# Patient Record
Sex: Female | Born: 1955 | Race: White | Hispanic: No | Marital: Single | Smoking: Former smoker
Health system: Southern US, Community
[De-identification: ages and names within clinical notes are randomized; demographics above are authoritative.]

## PROBLEM LIST (undated history)

## (undated) DIAGNOSIS — K819 Cholecystitis, unspecified: Secondary | ICD-10-CM

## (undated) DIAGNOSIS — M1990 Unspecified osteoarthritis, unspecified site: Secondary | ICD-10-CM

## (undated) DIAGNOSIS — I10 Essential (primary) hypertension: Secondary | ICD-10-CM

## (undated) DIAGNOSIS — K219 Gastro-esophageal reflux disease without esophagitis: Secondary | ICD-10-CM

## (undated) DIAGNOSIS — F419 Anxiety disorder, unspecified: Secondary | ICD-10-CM

## (undated) DIAGNOSIS — I4891 Unspecified atrial fibrillation: Secondary | ICD-10-CM

## (undated) DIAGNOSIS — E119 Type 2 diabetes mellitus without complications: Secondary | ICD-10-CM

## (undated) DIAGNOSIS — E785 Hyperlipidemia, unspecified: Secondary | ICD-10-CM

## (undated) DIAGNOSIS — R0600 Dyspnea, unspecified: Secondary | ICD-10-CM

## (undated) DIAGNOSIS — I509 Heart failure, unspecified: Secondary | ICD-10-CM

## (undated) HISTORY — DX: Unspecified atrial fibrillation: I48.91

## (undated) HISTORY — DX: Hyperlipidemia, unspecified: E78.5

## (undated) HISTORY — DX: Anxiety disorder, unspecified: F41.9

## (undated) HISTORY — DX: Heart failure, unspecified: I50.9

## (undated) HISTORY — DX: Cholecystitis, unspecified: K81.9

## (undated) HISTORY — DX: Essential (primary) hypertension: I10

## (undated) HISTORY — DX: Type 2 diabetes mellitus without complications: E11.9

---

## 2010-10-21 HISTORY — PX: COLONOSCOPY: SHX174

## 2011-03-04 ENCOUNTER — Ambulatory Visit: Payer: Self-pay | Admitting: Gastroenterology

## 2015-09-19 ENCOUNTER — Other Ambulatory Visit: Payer: Self-pay

## 2015-09-19 MED ORDER — HYDROCHLOROTHIAZIDE 25 MG PO TABS: 25.0000 mg | ORAL_TABLET | Freq: Every day | ORAL | Status: DC

## 2015-09-19 NOTE — Telephone Encounter (Signed)
LAST VISIT: 02/15/2015 PRACTICE PARTNER: [id number]  Request for HCTZ 25mg  tablet.

## 2015-09-19 NOTE — Telephone Encounter (Signed)
Apt and PE apt

## 2015-11-06 ENCOUNTER — Encounter: Payer: Self-pay | Admitting: Family Medicine

## 2015-12-04 DIAGNOSIS — I10 Essential (primary) hypertension: Secondary | ICD-10-CM | POA: Insufficient documentation

## 2015-12-04 DIAGNOSIS — I152 Hypertension secondary to endocrine disorders: Secondary | ICD-10-CM | POA: Insufficient documentation

## 2015-12-04 DIAGNOSIS — E1159 Type 2 diabetes mellitus with other circulatory complications: Secondary | ICD-10-CM | POA: Insufficient documentation

## 2015-12-04 DIAGNOSIS — E1169 Type 2 diabetes mellitus with other specified complication: Secondary | ICD-10-CM | POA: Insufficient documentation

## 2015-12-04 DIAGNOSIS — E1122 Type 2 diabetes mellitus with diabetic chronic kidney disease: Secondary | ICD-10-CM | POA: Insufficient documentation

## 2015-12-04 DIAGNOSIS — E785 Hyperlipidemia, unspecified: Secondary | ICD-10-CM | POA: Insufficient documentation

## 2015-12-04 DIAGNOSIS — N183 Chronic kidney disease, stage 3 unspecified: Secondary | ICD-10-CM | POA: Insufficient documentation

## 2015-12-07 ENCOUNTER — Encounter: Payer: Self-pay | Admitting: Family Medicine

## 2015-12-07 ENCOUNTER — Ambulatory Visit (INDEPENDENT_AMBULATORY_CARE_PROVIDER_SITE_OTHER): Payer: BLUE CROSS/BLUE SHIELD | Admitting: Family Medicine

## 2015-12-07 VITALS — BP 132/72 | HR 60 | Temp 98.1°F | Ht 60.8 in | Wt 201.0 lb

## 2015-12-07 DIAGNOSIS — E119 Type 2 diabetes mellitus without complications: Secondary | ICD-10-CM

## 2015-12-07 DIAGNOSIS — I10 Essential (primary) hypertension: Secondary | ICD-10-CM | POA: Diagnosis not present

## 2015-12-07 DIAGNOSIS — E785 Hyperlipidemia, unspecified: Secondary | ICD-10-CM

## 2015-12-07 LAB — LP+ALT+AST PICCOLO, WAIVED
ALT (SGPT) PICCOLO, WAIVED: 42 U/L (ref 10–47)
AST (SGOT) PICCOLO, WAIVED: 37 U/L (ref 11–38)
Chol/HDL Ratio Piccolo,Waive: 2.9 mg/dL
Cholesterol Piccolo, Waived: 178 mg/dL (ref <200)
HDL Chol Piccolo, Waived: 62 mg/dL (ref >59)
LDL Chol Calc Piccolo Waived: 70 mg/dL (ref <100)
TRIGLYCERIDES PICCOLO,WAIVED: 232 mg/dL — AB (ref <150)
VLDL Chol Calc Piccolo,Waive: 46 mg/dL — ABNORMAL HIGH (ref <30)

## 2015-12-07 LAB — BAYER DCA HB A1C WAIVED: HB A1C (BAYER DCA - WAIVED): 9.3 % — ABNORMAL HIGH (ref <7.0)

## 2015-12-07 MED ORDER — SAXAGLIPTIN HCL 5 MG PO TABS: 5.0000 mg | ORAL_TABLET | Freq: Every day | ORAL | Status: DC

## 2015-12-07 MED ORDER — GLIMEPIRIDE 4 MG PO TABS: 4.0000 mg | ORAL_TABLET | Freq: Every day | ORAL | Status: DC

## 2015-12-07 MED ORDER — CARVEDILOL 25 MG PO TABS: 25.0000 mg | ORAL_TABLET | Freq: Two times a day (BID) | ORAL | Status: DC

## 2015-12-07 MED ORDER — LOVASTATIN 20 MG PO TABS: 40.0000 mg | ORAL_TABLET | Freq: Every day | ORAL | Status: DC

## 2015-12-07 MED ORDER — MELOXICAM 15 MG PO TABS: 15.0000 mg | ORAL_TABLET | Freq: Every day | ORAL | Status: DC

## 2015-12-07 MED ORDER — TRAZODONE HCL 50 MG PO TABS: 50.0000 mg | ORAL_TABLET | Freq: Every day | ORAL | Status: DC

## 2015-12-07 MED ORDER — BENAZEPRIL HCL 40 MG PO TABS: 40.0000 mg | ORAL_TABLET | Freq: Every day | ORAL | Status: DC

## 2015-12-07 NOTE — Assessment & Plan Note (Addendum)
Discuss really poor control of diabetes. Patient will do better with lifestyle diet exercise nutrition Continue current medications and add Januvia If blood sugars not coming down patient will notify us sooner so that she doesn't suffer from the consequences of high blood sugar.

## 2015-12-07 NOTE — Assessment & Plan Note (Signed)
The current medical regimen is effective;  continue present plan and medications.  

## 2015-12-07 NOTE — Progress Notes (Signed)
BP 132/72 mmHg  Pulse 60  Temp(Src) 98.1 F (36.7 C)  Ht 5' 0.8" (1.544 m)  Wt 201 lb (91.173 kg)  BMI 38.24 kg/m2  SpO2 99%   Subjective:    Patient ID: Amanda [name], female    DOB: 1956-06-19, 60 y.o.   MRN: [id number]  HPI: Amanda [name] is a 60 y.o. female  Chief Complaint  Patient presents with  . Diabetes  . Hyperlipidemia  . Hypertension   Patient doing well with no complaints had lost her job and insurance needed to get reestablished his been able to take her medicines without problems side effects and is taking faithfully No low blood sugar spells no issues with blood pressure or cholesterol Has been able to lose a few pounds  Relevant past medical, surgical, family and social history reviewed and updated as indicated. Interim medical history since our last visit reviewed. Allergies and medications reviewed and updated.  Review of Systems  Constitutional: Negative.   Respiratory: Negative.   Cardiovascular: Negative.     Per HPI unless specifically indicated above     Objective:    BP 132/72 mmHg  Pulse 60  Temp(Src) 98.1 F (36.7 C)  Ht 5' 0.8" (1.544 m)  Wt 201 lb (91.173 kg)  BMI 38.24 kg/m2  SpO2 99%  Wt Readings from Last 3 Encounters:  12/07/15 201 lb (91.173 kg)  02/15/15 204 lb (92.534 kg)    Physical Exam  Constitutional: She is oriented to person, place, and time. She appears well-developed and well-nourished. No distress.  HENT:  Head: Normocephalic and atraumatic.  Right Ear: Hearing normal.  Left Ear: Hearing normal.  Nose: Nose normal.  Eyes: Conjunctivae and lids are normal. Right eye exhibits no discharge. Left eye exhibits no discharge. No scleral icterus.  Cardiovascular: Normal rate, regular rhythm and normal heart sounds.   Pulmonary/Chest: Effort normal and breath sounds normal. No respiratory distress.  Musculoskeletal: Normal range of motion.  Neurological: She is alert and oriented to person, place, and time.  Skin: Skin is  intact. No rash noted.  Psychiatric: She has a normal mood and affect. Her speech is normal and behavior is normal. Judgment and thought content normal. Cognition and memory are normal.    No results found for this or any previous visit.    Assessment & Plan:   Problem List Items Addressed This Visit      Cardiovascular and Mediastinum   Hypertension - Primary    The current medical regimen is effective;  continue present plan and medications.       Relevant Medications   benazepril (LOTENSIN) 40 MG tablet   lovastatin (MEVACOR) 20 MG tablet   carvedilol (COREG) 25 MG tablet   Other Relevant Orders   Basic metabolic panel     Endocrine   Diabetes mellitus without complication (HCC)    Discuss really poor control of diabetes. Patient will do better with lifestyle diet exercise nutrition Continue current medications and add Januvia If blood sugars not coming down patient will notify us sooner so that she doesn't suffer from the consequences of high blood sugar.        Relevant Medications   saxagliptin HCl (ONGLYZA) 5 MG TABS tablet   benazepril (LOTENSIN) 40 MG tablet   glimepiride (AMARYL) 4 MG tablet   lovastatin (MEVACOR) 20 MG tablet   Other Relevant Orders   Bayer DCA Hb A1c Waived     Other   Hyperlipidemia    The current medical regimen  is effective;  continue present plan and medications.       Relevant Medications   benazepril (LOTENSIN) 40 MG tablet   lovastatin (MEVACOR) 20 MG tablet   carvedilol (COREG) 25 MG tablet   Other Relevant Orders   LP+ALT+AST Piccolo, Waived       Follow up plan: Return in about 3 months (around 03/05/2016) for Physical Exam and a1c.

## 2015-12-08 LAB — BASIC METABOLIC PANEL
BUN/Creatinine Ratio: 20 (ref 11–26)
BUN: 17 mg/dL (ref 8–27)
CO2: 26 mmol/L (ref 18–29)
CREATININE: 0.86 mg/dL (ref 0.57–1.00)
Calcium: 10 mg/dL (ref 8.7–10.3)
Chloride: 98 mmol/L (ref 96–106)
GFR calc Af Amer: 85 mL/min/{1.73_m2} (ref >59)
GFR, EST NON AFRICAN AMERICAN: 74 mL/min/{1.73_m2} (ref >59)
Glucose: 122 mg/dL — ABNORMAL HIGH (ref 65–99)
Potassium: 4 mmol/L (ref 3.5–5.2)
SODIUM: 143 mmol/L (ref 134–144)

## 2015-12-09 ENCOUNTER — Encounter: Payer: Self-pay | Admitting: Family Medicine

## 2015-12-25 ENCOUNTER — Other Ambulatory Visit: Payer: Self-pay

## 2015-12-25 MED ORDER — HYDROCHLOROTHIAZIDE 25 MG PO TABS: 25.0000 mg | ORAL_TABLET | Freq: Every day | ORAL | Status: DC

## 2015-12-25 NOTE — Telephone Encounter (Signed)
Amanda [name] requesting refill for   Hydrochlorothiazide 25mg  TAb

## 2016-01-02 ENCOUNTER — Encounter: Payer: Self-pay | Admitting: Family Medicine

## 2016-01-02 ENCOUNTER — Ambulatory Visit (INDEPENDENT_AMBULATORY_CARE_PROVIDER_SITE_OTHER): Payer: BLUE CROSS/BLUE SHIELD | Admitting: Family Medicine

## 2016-01-02 VITALS — BP 136/80 | HR 69 | Temp 98.2°F | Ht 61.0 in | Wt 204.0 lb

## 2016-01-02 DIAGNOSIS — Z0000 Encounter for general adult medical examination without abnormal findings: Secondary | ICD-10-CM | POA: Diagnosis not present

## 2016-01-02 DIAGNOSIS — E119 Type 2 diabetes mellitus without complications: Secondary | ICD-10-CM

## 2016-01-02 DIAGNOSIS — E785 Hyperlipidemia, unspecified: Secondary | ICD-10-CM

## 2016-01-02 DIAGNOSIS — I10 Essential (primary) hypertension: Secondary | ICD-10-CM

## 2016-01-02 LAB — URINALYSIS, ROUTINE W REFLEX MICROSCOPIC
Bilirubin, UA: NEGATIVE
KETONES UA: NEGATIVE
NITRITE UA: NEGATIVE
Protein, UA: NEGATIVE
RBC, UA: NEGATIVE
SPEC GRAV UA: 1.015 (ref 1.005–1.030)
UUROB: 0.2 mg/dL (ref 0.2–1.0)
pH, UA: 6 (ref 5.0–7.5)

## 2016-01-02 LAB — MICROSCOPIC EXAMINATION

## 2016-01-02 NOTE — Progress Notes (Signed)
BP 136/80 mmHg  Pulse 69  Temp(Src) 98.2 F (36.8 C)  Ht  (1.549 m)  Wt 204 lb (92.534 kg)  BMI 38.57 kg/m2  SpO2 99%   Subjective:    Patient ID: Amanda [name], female    DOB: 23-Jul-1956, 60 y.o.   MRN: [id number]  HPI: Amanda [name] is a 60 y.o. female  Chief Complaint  Patient presents with  . Annual Exam  . Diabetes   patient follow-up diabetes seems to be doing better is trying but is gained 3 pounds no low blood sugar spells Blood pressure doing well no complaints taking medications faithfully Takes trazodone every now and then and does okay for sleep Takes meloxicam every day for arthritis does okay  She has found a new job since last visit  Relevant past medical, surgical, family and social history reviewed and updated as indicated. Interim medical history since our last visit reviewed. Allergies and medications reviewed and updated.  Review of Systems  Constitutional: Negative.   HENT: Negative.   Eyes: Negative.   Respiratory: Negative.   Cardiovascular: Negative.   Gastrointestinal: Negative.   Endocrine: Negative.   Genitourinary: Negative.   Musculoskeletal: Negative.   Skin: Negative.   Allergic/Immunologic: Negative.   Neurological: Negative.   Hematological: Negative.   Psychiatric/Behavioral: Negative.     Per HPI unless specifically indicated above     Objective:    BP 136/80 mmHg  Pulse 69  Temp(Src) 98.2 F (36.8 C)  Ht  (1.549 m)  Wt 204 lb (92.534 kg)  BMI 38.57 kg/m2  SpO2 99%  Wt Readings from Last 3 Encounters:  01/02/16 204 lb (92.534 kg)  12/07/15 201 lb (91.173 kg)  02/15/15 204 lb (92.534 kg)    Physical Exam  Constitutional: She is oriented to person, place, and time. She appears well-developed and well-nourished.  HENT:  Head: Normocephalic and atraumatic.  Right Ear: External ear normal.  Left Ear: External ear normal.  Nose: Nose normal.  Mouth/Throat: Oropharynx is clear and moist.  Eyes: Conjunctivae and  EOM are normal. Pupils are equal, round, and reactive to light.  Neck: Normal range of motion. Neck supple. Carotid bruit is not present.  Cardiovascular: Normal rate, regular rhythm and normal heart sounds.   No murmur heard. Pulmonary/Chest: Effort normal and breath sounds normal. She exhibits no mass. Right breast exhibits no mass, no skin change and no tenderness. Left breast exhibits no mass, no skin change and no tenderness. Breasts are symmetrical.  Abdominal: Soft. Bowel sounds are normal. There is no hepatosplenomegaly.  Genitourinary: Vagina normal and uterus normal.  Musculoskeletal: Normal range of motion.  Neurological: She is alert and oriented to person, place, and time.  Skin: No rash noted.  Psychiatric: She has a normal mood and affect. Her behavior is normal. Judgment and thought content normal.    Results for orders placed or performed in visit on 12/07/15  Bayer DCA Hb A1c Waived  Result Value Ref Range   Bayer DCA Hb A1c Waived 9.3 (H) <7.0 %  LP+ALT+AST Piccolo, Waived  Result Value Ref Range   ALT (SGPT) Piccolo, Waived 42 10 - 47 U/L   AST (SGOT) Piccolo, Waived 37 11 - 38 U/L   Cholesterol Piccolo, Waived 178 <200 mg/dL   HDL Chol Piccolo, Waived 62 >59 mg/dL   Triglycerides Piccolo,Waived 232 (H) <150 mg/dL   Chol/HDL Ratio Piccolo,Waive 2.9 mg/dL   LDL Chol Calc Piccolo Waived 70 <100 mg/dL   VLDL Chol Calc  Piccolo,Waive 46 (H) <30 mg/dL  Basic metabolic panel  Result Value Ref Range   Glucose 122 (H) 65 - 99 mg/dL   BUN 17 8 - 27 mg/dL   Creatinine, Ser 6.60 0.57 - 1.00 mg/dL   GFR calc non Af Amer 74 >59 mL/min/1.73   GFR calc Af Amer 85 >59 mL/min/1.73   BUN/Creatinine Ratio 20 11 - 26   Sodium 143 134 - 144 mmol/L   Potassium 4.0 3.5 - 5.2 mmol/L   Chloride 98 96 - 106 mmol/L   CO2 26 18 - 29 mmol/L   Calcium 10.0 8.7 - 10.3 mg/dL      Assessment & Plan:   Problem List Items Addressed This Visit      Cardiovascular and Mediastinum    Hypertension    The current medical regimen is effective;  continue present plan and medications.         Endocrine   Diabetes mellitus without complication (HCC)     Other   Hyperlipidemia    The current medical regimen is effective;  continue present plan and medications.        Other Visit Diagnoses    Routine general medical examination at a health care facility    -  Primary    Relevant Orders    CBC with Differential/Platelet    Comprehensive metabolic panel    Lipid Panel w/o Chol/HDL Ratio    TSH    Urinalysis, Routine w reflex microscopic (not at [facility])    IGP, Aptima HPV, rfx 16/18,45        Follow up plan: Return in about 3 months (around 04/03/2016) for a1c.

## 2016-01-02 NOTE — Assessment & Plan Note (Signed)
The current medical regimen is effective;  continue present plan and medications.  

## 2016-01-03 ENCOUNTER — Encounter: Payer: Self-pay | Admitting: Family Medicine

## 2016-01-03 LAB — COMPREHENSIVE METABOLIC PANEL
ALBUMIN: 4.3 g/dL (ref 3.6–4.8)
ALK PHOS: 63 IU/L (ref 39–117)
ALT: 36 IU/L — ABNORMAL HIGH (ref 0–32)
AST: 33 IU/L (ref 0–40)
Albumin/Globulin Ratio: 1.8 (ref 1.2–2.2)
BILIRUBIN TOTAL: 0.4 mg/dL (ref 0.0–1.2)
BUN / CREAT RATIO: 22 (ref 11–26)
BUN: 17 mg/dL (ref 8–27)
CHLORIDE: 98 mmol/L (ref 96–106)
CO2: 24 mmol/L (ref 18–29)
Calcium: 10 mg/dL (ref 8.7–10.3)
Creatinine, Ser: 0.79 mg/dL (ref 0.57–1.00)
GFR calc Af Amer: 94 mL/min/{1.73_m2} (ref >59)
GFR calc non Af Amer: 82 mL/min/{1.73_m2} (ref >59)
GLOBULIN, TOTAL: 2.4 g/dL (ref 1.5–4.5)
GLUCOSE: 126 mg/dL — AB (ref 65–99)
POTASSIUM: 4.3 mmol/L (ref 3.5–5.2)
SODIUM: 142 mmol/L (ref 134–144)
Total Protein: 6.7 g/dL (ref 6.0–8.5)

## 2016-01-03 LAB — CBC WITH DIFFERENTIAL/PLATELET
BASOS ABS: 0 10*3/uL (ref 0.0–0.2)
Basos: 0 %
EOS (ABSOLUTE): 0.2 10*3/uL (ref 0.0–0.4)
Eos: 3 %
HEMOGLOBIN: 12.2 g/dL (ref 11.1–15.9)
Hematocrit: 35.9 % (ref 34.0–46.6)
Immature Grans (Abs): 0 10*3/uL (ref 0.0–0.1)
Immature Granulocytes: 0 %
LYMPHS ABS: 1.6 10*3/uL (ref 0.7–3.1)
Lymphs: 25 %
MCH: 30 pg (ref 26.6–33.0)
MCHC: 34 g/dL (ref 31.5–35.7)
MCV: 88 fL (ref 79–97)
MONOCYTES: 8 %
Monocytes Absolute: 0.5 10*3/uL (ref 0.1–0.9)
NEUTROS ABS: 3.9 10*3/uL (ref 1.4–7.0)
Neutrophils: 64 %
Platelets: 183 10*3/uL (ref 150–379)
RBC: 4.06 x10E6/uL (ref 3.77–5.28)
RDW: 13.9 % (ref 12.3–15.4)
WBC: 6.1 10*3/uL (ref 3.4–10.8)

## 2016-01-03 LAB — LIPID PANEL W/O CHOL/HDL RATIO
CHOLESTEROL TOTAL: 163 mg/dL (ref 100–199)
HDL: 55 mg/dL (ref >39)
LDL CALC: 63 mg/dL (ref 0–99)
TRIGLYCERIDES: 226 mg/dL — AB (ref 0–149)
VLDL CHOLESTEROL CAL: 45 mg/dL — AB (ref 5–40)

## 2016-01-03 LAB — TSH: TSH: 2.53 u[IU]/mL (ref 0.450–4.500)

## 2016-01-04 LAB — IGP, APTIMA HPV, RFX 16/18,45
HPV APTIMA: NEGATIVE
PAP SMEAR COMMENT: 0

## 2016-02-15 ENCOUNTER — Ambulatory Visit (INDEPENDENT_AMBULATORY_CARE_PROVIDER_SITE_OTHER): Payer: BLUE CROSS/BLUE SHIELD | Admitting: Family Medicine

## 2016-02-15 ENCOUNTER — Telehealth: Payer: Self-pay | Admitting: Family Medicine

## 2016-02-15 ENCOUNTER — Encounter: Payer: Self-pay | Admitting: Family Medicine

## 2016-02-15 VITALS — BP 103/66 | HR 75 | Temp 97.7°F | Ht 61.6 in | Wt 200.4 lb

## 2016-02-15 DIAGNOSIS — J019 Acute sinusitis, unspecified: Secondary | ICD-10-CM

## 2016-02-15 DIAGNOSIS — E785 Hyperlipidemia, unspecified: Secondary | ICD-10-CM | POA: Diagnosis not present

## 2016-02-15 MED ORDER — LOVASTATIN 20 MG PO TABS: 40.0000 mg | ORAL_TABLET | Freq: Every day | ORAL | Status: DC

## 2016-02-15 MED ORDER — CODEINE POLT-CHLORPHEN POLT ER 14.7-2.8 MG/5ML PO SUER: 5.0000 mL | Freq: Two times a day (BID) | ORAL | Status: DC | PRN

## 2016-02-15 MED ORDER — HYDROCOD POLST-CPM POLST ER 10-8 MG/5ML PO SUER: 5.0000 mL | Freq: Every evening | ORAL | Status: DC | PRN

## 2016-02-15 MED ORDER — AZITHROMYCIN 250 MG PO TABS: ORAL_TABLET | ORAL | Status: DC

## 2016-02-15 NOTE — Progress Notes (Signed)
BP 103/66 mmHg  Pulse 75  Temp(Src) 97.7 F (36.5 C)  Ht 5' 1.6" (1.565 m)  Wt 200 lb 6.4 oz (90.901 kg)  BMI 37.11 kg/m2  SpO2 100%   Subjective:    Patient ID: Amanda [name], female    DOB: 1956-05-14, 60 y.o.   MRN: [id number]  HPI: Amanda [name] is a 60 y.o. female  Chief Complaint  Patient presents with  . Cough    pt states the cough started Friday night, got worse Saturday morning and still has the cough. She states now she has gotten congested, runny nose, and ear pain.   Patient coughing a great deal with a lot of sinus congestion aches and other systemic symptoms of fever  Has a lot of facial pressure Relevant past medical, surgical, family and social history reviewed and updated as indicated. Interim medical history since our last visit reviewed. Allergies and medications reviewed and updated.  Review of Systems  Constitutional: Positive for fever, chills, diaphoresis and fatigue.  HENT: Positive for congestion, postnasal drip, rhinorrhea, sinus pressure, sneezing and sore throat.   Respiratory: Positive for cough, choking and shortness of breath. Negative for apnea.   Cardiovascular: Negative for chest pain, palpitations and leg swelling.    Per HPI unless specifically indicated above     Objective:    BP 103/66 mmHg  Pulse 75  Temp(Src) 97.7 F (36.5 C)  Ht 5' 1.6" (1.565 m)  Wt 200 lb 6.4 oz (90.901 kg)  BMI 37.11 kg/m2  SpO2 100%  Wt Readings from Last 3 Encounters:  02/15/16 200 lb 6.4 oz (90.901 kg)  01/02/16 204 lb (92.534 kg)  12/07/15 201 lb (91.173 kg)    Physical Exam  Constitutional: She is oriented to person, place, and time. She appears well-developed and well-nourished. No distress.  HENT:  Head: Normocephalic and atraumatic.  Right Ear: Hearing and external ear normal.  Left Ear: Hearing and external ear normal.  Nose: Nose normal.  Mouth/Throat: Oropharyngeal exudate present.  Eyes: Conjunctivae and lids are normal. Right eye exhibits no  discharge. Left eye exhibits no discharge. No scleral icterus.  Cardiovascular: Normal rate, regular rhythm and normal heart sounds.   Pulmonary/Chest: Effort normal and breath sounds normal. No respiratory distress.  Musculoskeletal: Normal range of motion.  Neurological: She is alert and oriented to person, place, and time.  Skin: Skin is intact. No rash noted.  Psychiatric: She has a normal mood and affect. Her speech is normal and behavior is normal. Judgment and thought content normal. Cognition and memory are normal.    Results for orders placed or performed in visit on 01/02/16  Microscopic Examination  Result Value Ref Range   WBC, UA 0-5 0 -  5 /hpf   RBC, UA 0-2 0 -  2 /hpf   Epithelial Cells (non renal) 0-10 0 - 10 /hpf   Bacteria, UA Few None seen/Few  CBC with Differential/Platelet  Result Value Ref Range   WBC 6.1 3.4 - 10.8 x10E3/uL   RBC 4.06 3.77 - 5.28 x10E6/uL   Hemoglobin 12.2 11.1 - 15.9 g/dL   Hematocrit 95.6 38.7 - 46.6 %   MCV 88 79 - 97 fL   MCH 30.0 26.6 - 33.0 pg   MCHC 34.0 31.5 - 35.7 g/dL   RDW 56.4 33.2 - 95.1 %   Platelets 183 150 - 379 x10E3/uL   Neutrophils 64 %   Lymphs 25 %   Monocytes 8 %   Eos 3 %  Basos 0 %   Neutrophils Absolute 3.9 1.4 - 7.0 x10E3/uL   Lymphocytes Absolute 1.6 0.7 - 3.1 x10E3/uL   Monocytes Absolute 0.5 0.1 - 0.9 x10E3/uL   EOS (ABSOLUTE) 0.2 0.0 - 0.4 x10E3/uL   Basophils Absolute 0.0 0.0 - 0.2 x10E3/uL   Immature Granulocytes 0 %   Immature Grans (Abs) 0.0 0.0 - 0.1 x10E3/uL  Comprehensive metabolic panel  Result Value Ref Range   Glucose 126 (H) 65 - 99 mg/dL   BUN 17 8 - 27 mg/dL   Creatinine, Ser 1.61 0.57 - 1.00 mg/dL   GFR calc non Af Amer 82 >59 mL/min/1.73   GFR calc Af Amer 94 >59 mL/min/1.73   BUN/Creatinine Ratio 22 11 - 26   Sodium 142 134 - 144 mmol/L   Potassium 4.3 3.5 - 5.2 mmol/L   Chloride 98 96 - 106 mmol/L   CO2 24 18 - 29 mmol/L   Calcium 10.0 8.7 - 10.3 mg/dL   Total Protein 6.7 6.0 -  8.5 g/dL   Albumin 4.3 3.6 - 4.8 g/dL   Globulin, Total 2.4 1.5 - 4.5 g/dL   Albumin/Globulin Ratio 1.8 1.2 - 2.2   Bilirubin Total 0.4 0.0 - 1.2 mg/dL   Alkaline Phosphatase 63 39 - 117 IU/L   AST 33 0 - 40 IU/L   ALT 36 (H) 0 - 32 IU/L  Lipid Panel w/o Chol/HDL Ratio  Result Value Ref Range   Cholesterol, Total 163 100 - 199 mg/dL   Triglycerides 096 (H) 0 - 149 mg/dL   HDL 55 >04 mg/dL   VLDL Cholesterol Cal 45 (H) 5 - 40 mg/dL   LDL Calculated 63 0 - 99 mg/dL  TSH  Result Value Ref Range   TSH 2.530 0.450 - 4.500 uIU/mL  Urinalysis, Routine w reflex microscopic (not at [facility])  Result Value Ref Range   Specific Gravity, UA 1.015 1.005 - 1.030   pH, UA 6.0 5.0 - 7.5   Color, UA Yellow Yellow   Appearance Ur Cloudy (A) Clear   Leukocytes, UA Trace (A) Negative   Protein, UA Negative Negative/Trace   Glucose, UA 3+ (A) Negative   Ketones, UA Negative Negative   RBC, UA Negative Negative   Bilirubin, UA Negative Negative   Urobilinogen, Ur 0.2 0.2 - 1.0 mg/dL   Nitrite, UA Negative Negative   Microscopic Examination See below:   IGP, Aptima HPV, rfx 16/18,45  Result Value Ref Range   DIAGNOSIS: Comment    Specimen adequacy: Comment    CLINICIAN PROVIDED ICD10: Comment    Performed by: Comment    PAP SMEAR COMMENT .    Note: Comment    Test Methodology Comment    HPV Aptima Negative Negative      Assessment & Plan:   Problem List Items Addressed This Visit      Other   Hyperlipidemia - Primary   Relevant Medications   lovastatin (MEVACOR) 20 MG tablet    Other Visit Diagnoses    Acute sinusitis, recurrence not specified, unspecified location        Discussed sinusitis care and treatment use Mucinex use of codeine cough syrup and cautions, antibiotics rest out of work use of Tylenol nasal rinse.    Relevant Medications    azithromycin (ZITHROMAX) 250 MG tablet    Codeine Polt-Chlorphen Polt ER (TUZISTRA XR) 14.7-2.8 MG/5ML SUER        Follow up plan: Return  for As scheduled.

## 2016-02-15 NOTE — Telephone Encounter (Signed)
Pharmacy called and stated that Codeine Polt-Chlorphen Polt ER (TUZISTRA XR) 14.7-2.8 MG/5ML SUER and would like it to be changed to something else.

## 2016-02-15 NOTE — Telephone Encounter (Signed)
Rx for tussionex written for her to pick up

## 2016-03-03 ENCOUNTER — Other Ambulatory Visit: Payer: Self-pay | Admitting: Family Medicine

## 2016-03-13 ENCOUNTER — Ambulatory Visit (INDEPENDENT_AMBULATORY_CARE_PROVIDER_SITE_OTHER): Payer: BLUE CROSS/BLUE SHIELD | Admitting: Family Medicine

## 2016-03-13 ENCOUNTER — Encounter: Payer: Self-pay | Admitting: Family Medicine

## 2016-03-13 VITALS — BP 122/75 | HR 64 | Temp 97.9°F | Ht 61.6 in | Wt 204.0 lb

## 2016-03-13 DIAGNOSIS — E785 Hyperlipidemia, unspecified: Secondary | ICD-10-CM

## 2016-03-13 DIAGNOSIS — E119 Type 2 diabetes mellitus without complications: Secondary | ICD-10-CM | POA: Diagnosis not present

## 2016-03-13 DIAGNOSIS — I10 Essential (primary) hypertension: Secondary | ICD-10-CM

## 2016-03-13 LAB — BAYER DCA HB A1C WAIVED: HB A1C: 7.7 % — AB (ref <7.0)

## 2016-03-13 MED ORDER — DAPAGLIFLOZIN PRO-METFORMIN ER 5-1000 MG PO TB24: 2.0000 | ORAL_TABLET | Freq: Every day | ORAL | Status: DC

## 2016-03-13 MED ORDER — SAXAGLIPTIN HCL 5 MG PO TABS: 5.0000 mg | ORAL_TABLET | Freq: Every day | ORAL | Status: DC

## 2016-03-13 NOTE — Progress Notes (Signed)
BP 122/75 mmHg  Pulse 64  Temp(Src) 97.9 F (36.6 C)  Ht 5' 1.6" (1.565 m)  Wt 204 lb (92.534 kg)  BMI 37.78 kg/m2  SpO2 99%   Subjective:    Patient ID: Amanda [name], female    DOB: 02-20-1956, 60 y.o.   MRN: [id number]  HPI: Amanda [name] is a 60 y.o. female  Chief Complaint  Patient presents with  . Diabetes   Patient recheck diabetes doing better having better on blood sugar monitoring checks said 1 spell of blood sugar getting little bit low took a hard candy and did just fine until lunchtime rations well. Blood pressure good control no complaints Cholesterol good control  Relevant past medical, surgical, family and social history reviewed and updated as indicated. Interim medical history since our last visit reviewed. Allergies and medications reviewed and updated.  Review of Systems  Constitutional: Negative.   Respiratory: Negative.   Cardiovascular: Negative.     Per HPI unless specifically indicated above     Objective:    BP 122/75 mmHg  Pulse 64  Temp(Src) 97.9 F (36.6 C)  Ht 5' 1.6" (1.565 m)  Wt 204 lb (92.534 kg)  BMI 37.78 kg/m2  SpO2 99%  Wt Readings from Last 3 Encounters:  03/13/16 204 lb (92.534 kg)  02/15/16 200 lb 6.4 oz (90.901 kg)  01/02/16 204 lb (92.534 kg)    Physical Exam  Constitutional: She is oriented to person, place, and time. She appears well-developed and well-nourished. No distress.  HENT:  Head: Normocephalic and atraumatic.  Right Ear: Hearing normal.  Left Ear: Hearing normal.  Nose: Nose normal.  Eyes: Conjunctivae and lids are normal. Right eye exhibits no discharge. Left eye exhibits no discharge. No scleral icterus.  Cardiovascular: Normal rate, regular rhythm and normal heart sounds.   Pulmonary/Chest: Effort normal and breath sounds normal. No respiratory distress.  Musculoskeletal: Normal range of motion.  Neurological: She is alert and oriented to person, place, and time.  Skin: Skin is intact. No rash noted.   Psychiatric: She has a normal mood and affect. Her speech is normal and behavior is normal. Judgment and thought content normal. Cognition and memory are normal.    Results for orders placed or performed in visit on 01/02/16  Microscopic Examination  Result Value Ref Range   WBC, UA 0-5 0 -  5 /hpf   RBC, UA 0-2 0 -  2 /hpf   Epithelial Cells (non renal) 0-10 0 - 10 /hpf   Bacteria, UA Few None seen/Few  CBC with Differential/Platelet  Result Value Ref Range   WBC 6.1 3.4 - 10.8 x10E3/uL   RBC 4.06 3.77 - 5.28 x10E6/uL   Hemoglobin 12.2 11.1 - 15.9 g/dL   Hematocrit 09.6 28.3 - 46.6 %   MCV 88 79 - 97 fL   MCH 30.0 26.6 - 33.0 pg   MCHC 34.0 31.5 - 35.7 g/dL   RDW 66.2 94.7 - 65.4 %   Platelets 183 150 - 379 x10E3/uL   Neutrophils 64 %   Lymphs 25 %   Monocytes 8 %   Eos 3 %   Basos 0 %   Neutrophils Absolute 3.9 1.4 - 7.0 x10E3/uL   Lymphocytes Absolute 1.6 0.7 - 3.1 x10E3/uL   Monocytes Absolute 0.5 0.1 - 0.9 x10E3/uL   EOS (ABSOLUTE) 0.2 0.0 - 0.4 x10E3/uL   Basophils Absolute 0.0 0.0 - 0.2 x10E3/uL   Immature Granulocytes 0 %   Immature Grans (Abs) 0.0 0.0 -  0.1 x10E3/uL  Comprehensive metabolic panel  Result Value Ref Range   Glucose 126 (H) 65 - 99 mg/dL   BUN 17 8 - 27 mg/dL   Creatinine, Ser 1.61 0.57 - 1.00 mg/dL   GFR calc non Af Amer 82 >59 mL/min/1.73   GFR calc Af Amer 94 >59 mL/min/1.73   BUN/Creatinine Ratio 22 11 - 26   Sodium 142 134 - 144 mmol/L   Potassium 4.3 3.5 - 5.2 mmol/L   Chloride 98 96 - 106 mmol/L   CO2 24 18 - 29 mmol/L   Calcium 10.0 8.7 - 10.3 mg/dL   Total Protein 6.7 6.0 - 8.5 g/dL   Albumin 4.3 3.6 - 4.8 g/dL   Globulin, Total 2.4 1.5 - 4.5 g/dL   Albumin/Globulin Ratio 1.8 1.2 - 2.2   Bilirubin Total 0.4 0.0 - 1.2 mg/dL   Alkaline Phosphatase 63 39 - 117 IU/L   AST 33 0 - 40 IU/L   ALT 36 (H) 0 - 32 IU/L  Lipid Panel w/o Chol/HDL Ratio  Result Value Ref Range   Cholesterol, Total 163 100 - 199 mg/dL   Triglycerides 096 (H)  0 - 149 mg/dL   HDL 55 >04 mg/dL   VLDL Cholesterol Cal 45 (H) 5 - 40 mg/dL   LDL Calculated 63 0 - 99 mg/dL  TSH  Result Value Ref Range   TSH 2.530 0.450 - 4.500 uIU/mL  Urinalysis, Routine w reflex microscopic (not at [facility])  Result Value Ref Range   Specific Gravity, UA 1.015 1.005 - 1.030   pH, UA 6.0 5.0 - 7.5   Color, UA Yellow Yellow   Appearance Ur Cloudy (A) Clear   Leukocytes, UA Trace (A) Negative   Protein, UA Negative Negative/Trace   Glucose, UA 3+ (A) Negative   Ketones, UA Negative Negative   RBC, UA Negative Negative   Bilirubin, UA Negative Negative   Urobilinogen, Ur 0.2 0.2 - 1.0 mg/dL   Nitrite, UA Negative Negative   Microscopic Examination See below:   IGP, Aptima HPV, rfx 16/18,45  Result Value Ref Range   DIAGNOSIS: Comment    Specimen adequacy: Comment    CLINICIAN PROVIDED ICD10: Comment    Performed by: Comment    PAP SMEAR COMMENT .    Note: Comment    Test Methodology Comment    HPV Aptima Negative Negative      Assessment & Plan:   Problem List Items Addressed This Visit      Cardiovascular and Mediastinum   Hypertension    The current medical regimen is effective;  continue present plan and medications.         Endocrine   Diabetes mellitus without complication (HCC) - Primary    Discussed with patient Will achieve control with better diet exercise nutrition      Relevant Orders   Bayer DCA Hb A1c Waived     Other   Hyperlipidemia    The current medical regimen is effective;  continue present plan and medications.           Follow up plan: Return in about 3 months (around 06/13/2016) for Physical Exam a1c.

## 2016-03-13 NOTE — Assessment & Plan Note (Signed)
The current medical regimen is effective;  continue present plan and medications.  

## 2016-03-13 NOTE — Assessment & Plan Note (Signed)
Discussed with patient Amanda [name] achieve control with better diet exercise nutrition

## 2016-03-21 ENCOUNTER — Other Ambulatory Visit: Payer: Self-pay

## 2016-03-21 MED ORDER — HYDROCHLOROTHIAZIDE 25 MG PO TABS: 25.0000 mg | ORAL_TABLET | Freq: Every day | ORAL | Status: DC

## 2016-04-22 ENCOUNTER — Ambulatory Visit (INDEPENDENT_AMBULATORY_CARE_PROVIDER_SITE_OTHER): Payer: BLUE CROSS/BLUE SHIELD | Admitting: Family Medicine

## 2016-04-22 ENCOUNTER — Encounter: Payer: Self-pay | Admitting: Family Medicine

## 2016-04-22 VITALS — BP 109/74 | HR 83 | Temp 97.8°F | Wt 200.0 lb

## 2016-04-22 DIAGNOSIS — J069 Acute upper respiratory infection, unspecified: Secondary | ICD-10-CM

## 2016-04-22 MED ORDER — BENZONATATE 100 MG PO CAPS: 100.0000 mg | ORAL_CAPSULE | Freq: Two times a day (BID) | ORAL | Status: DC | PRN

## 2016-04-22 MED ORDER — HYDROCOD POLST-CPM POLST ER 10-8 MG/5ML PO SUER: 5.0000 mL | Freq: Two times a day (BID) | ORAL | Status: DC | PRN

## 2016-04-22 MED ORDER — ALBUTEROL SULFATE HFA 108 (90 BASE) MCG/ACT IN AERS: 2.0000 | INHALATION_SPRAY | Freq: Four times a day (QID) | RESPIRATORY_TRACT | Status: DC | PRN

## 2016-04-22 MED ORDER — AZITHROMYCIN 250 MG PO TABS: ORAL_TABLET | ORAL | Status: DC

## 2016-04-22 MED ORDER — FLUTICASONE PROPIONATE 50 MCG/ACT NA SUSP: 2.0000 | Freq: Every day | NASAL | Status: DC

## 2016-04-22 NOTE — Progress Notes (Signed)
BP 109/74 mmHg  Pulse 83  Temp(Src) 97.8 F (36.6 C)  Wt 200 lb (90.719 kg)  SpO2 100%   Subjective:    Patient ID: Amanda [name], female    DOB: 12-05-1955, 60 y.o.   MRN: [id number]  HPI: Amanda [name] is a 60 y.o. female  Chief Complaint  Patient presents with  . URI    Started with a sore throat on the 16th. Has progressivly gotten worse. Productive yellow/cream color cough. Chest congestion. No fever. Trying Mucinex, Cold Ease, finished cough syrup from back in April.   Patient presents with 3 week history of upper respiratory symptoms. Started with sore throat mid last month, progressed to hoarse voice, nasal congestion, sinus pressure, and a persistent nagging cough that is occasionally productive. Trying mucinex, cough syrup, cold medicines. Some chills early on, no fevers. some wheezing, states she has mild asthma that only flares when she is sick. Does not currently have any inhalers. Denies CP or SOB. Having trouble sleeping through the night from the coughing. 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   Relevant past medical, surgical, family and social history reviewed and updated as indicated. Interim medical history since our last visit reviewed. Allergies and medications reviewed and updated.  Review of Systems  Constitutional: Positive for chills. Negative for fever and diaphoresis.  HENT: Positive for congestion, ear pain (intermittent fullness), sinus pressure and sore throat. Hearing loss: muffled hearing intermittently.   Respiratory: Positive for cough and wheezing. Negative for shortness of breath.   Cardiovascular: Negative.  Negative for chest pain.  Gastrointestinal: Negative.   Musculoskeletal: Negative.   Skin: Negative.   Neurological: Negative.   Psychiatric/Behavioral: Negative.     Per HPI unless specifically indicated above     Objective:    BP 109/74 mmHg  Pulse 83  Temp(Src) 97.8 F (36.6 C)  Wt 200 lb (90.719 kg)  SpO2  100%  Wt Readings from Last 3 Encounters:  04/22/16 200 lb (90.719 kg)  03/13/16 204 lb (92.534 kg)  02/15/16 200 lb 6.4 oz (90.901 kg)    Physical Exam  Constitutional: She is oriented to person, place, and time. She appears well-developed and well-nourished. No distress.  HENT:  Head: Atraumatic.  Right Ear: External ear normal.  Left Ear: External ear normal.  Nose: Nose normal.  Mouth/Throat: Oropharynx is clear and moist. No oropharyngeal exudate.  Right TM with mild effusion present  Eyes: Conjunctivae are normal. No scleral icterus.  Neck: Normal range of motion. Neck supple.  Cardiovascular: Normal rate and normal heart sounds.   Pulmonary/Chest: Effort normal. No respiratory distress. She has wheezes (diffuse). Rales: scattered, RLL.  Musculoskeletal: Normal range of motion.  Lymphadenopathy:    She has no cervical adenopathy.  Neurological: She is alert and oriented to person, place, and time.  Skin: Skin is warm and dry. No rash noted.  Psychiatric: She has a normal mood and affect. Her behavior is normal.  Nursing note and vitals reviewed.   Results for orders placed or performed in visit on 03/13/16  Bayer DCA Hb A1c Waived  Result Value Ref Range   Bayer DCA Hb A1c Waived 7.7 (H) <7.0 %      Assessment & Plan:   Problem List Items Addressed This Visit    None    Visit Diagnoses    Upper respiratory infection    -  Primary    Given duration and worsening severity, z pak given. Refilled tussionex and  sent tessalon perles for cough. Encouraged plenty of rest and to stay well hydrated.     Relevant Medications    azithromycin (ZITHROMAX Z-PAK) 250 MG tablet      Discussed risks and cautions with medications, patient agreeable with plan.   Follow up plan: Return if symptoms worsen or fail to improve.

## 2016-06-12 ENCOUNTER — Encounter: Payer: Self-pay | Admitting: Family Medicine

## 2016-06-12 ENCOUNTER — Ambulatory Visit (INDEPENDENT_AMBULATORY_CARE_PROVIDER_SITE_OTHER): Payer: BLUE CROSS/BLUE SHIELD | Admitting: Family Medicine

## 2016-06-12 VITALS — BP 138/83 | HR 66 | Temp 97.5°F | Ht 62.0 in | Wt 201.0 lb

## 2016-06-12 DIAGNOSIS — I10 Essential (primary) hypertension: Secondary | ICD-10-CM

## 2016-06-12 DIAGNOSIS — E785 Hyperlipidemia, unspecified: Secondary | ICD-10-CM

## 2016-06-12 DIAGNOSIS — E119 Type 2 diabetes mellitus without complications: Secondary | ICD-10-CM | POA: Diagnosis not present

## 2016-06-12 LAB — MICROALBUMIN, URINE WAIVED
CREATININE, URINE WAIVED: 50 mg/dL (ref 10–300)
Microalb, Ur Waived: 30 mg/L — ABNORMAL HIGH (ref 0–19)

## 2016-06-12 LAB — HEMOGLOBIN A1C: Hemoglobin A1C: 7.8

## 2016-06-12 LAB — MICROALBUMIN, URINE: Microalb, Ur: 30

## 2016-06-12 LAB — BAYER DCA HB A1C WAIVED: HB A1C (BAYER DCA - WAIVED): 7.8 % — ABNORMAL HIGH (ref <7.0)

## 2016-06-12 MED ORDER — HYDROCHLOROTHIAZIDE 25 MG PO TABS: 25.0000 mg | ORAL_TABLET | Freq: Every day | ORAL | 1 refills | Status: DC

## 2016-06-12 MED ORDER — GLIMEPIRIDE 4 MG PO TABS
4.0000 mg | ORAL_TABLET | Freq: Every day | ORAL | 6 refills | Status: DC
Start: 2016-06-12 — End: 2017-01-07

## 2016-06-12 MED ORDER — CARVEDILOL 25 MG PO TABS: 25.0000 mg | ORAL_TABLET | Freq: Two times a day (BID) | ORAL | 6 refills | Status: DC

## 2016-06-12 MED ORDER — TRAZODONE HCL 50 MG PO TABS: 50.0000 mg | ORAL_TABLET | Freq: Every day | ORAL | 6 refills | Status: DC

## 2016-06-12 MED ORDER — BENAZEPRIL HCL 40 MG PO TABS: 40.0000 mg | ORAL_TABLET | Freq: Every day | ORAL | 6 refills | Status: DC

## 2016-06-12 MED ORDER — SAXAGLIPTIN HCL 5 MG PO TABS: 5.0000 mg | ORAL_TABLET | Freq: Every day | ORAL | 6 refills | Status: DC

## 2016-06-12 MED ORDER — MELOXICAM 15 MG PO TABS: 15.0000 mg | ORAL_TABLET | Freq: Every day | ORAL | 6 refills | Status: DC

## 2016-06-12 MED ORDER — DAPAGLIFLOZIN PRO-METFORMIN ER 5-1000 MG PO TB24: 2.0000 | ORAL_TABLET | Freq: Every day | ORAL | 6 refills | Status: DC

## 2016-06-12 MED ORDER — LOVASTATIN 20 MG PO TABS: 40.0000 mg | ORAL_TABLET | Freq: Every day | ORAL | 6 refills | Status: DC

## 2016-06-12 NOTE — Progress Notes (Signed)
BP 138/83 (BP Location: Left Arm, Patient Position: Sitting, Cuff Size: Normal)   Pulse 66   Temp 97.5 F (36.4 C)   Ht 5\' 2"  (1.575 m)   Wt 201 lb (91.2 kg)   SpO2 99%   BMI 36.76 kg/m    Subjective:    Patient ID: Amanda [name], female    DOB: 10/27/1955, 60 y.o.   MRN: [id number]  HPI: Amanda [name] is a 60 y.o. female  Chief Complaint  Patient presents with  . Diabetes   Patient recheck diabetes is been doing well considering the amount of stress is been ongoing in her life noted low blood sugar spells no issues with medications taken faithfully Blood pressure doing well no complaints from medications taken faithfully Cholesterol doing well no complaints from medications taking faithfully  Relevant past medical, surgical, family and social history reviewed and updated as indicated. Interim medical history since our last visit reviewed. Allergies and medications reviewed and updated.  Review of Systems  Constitutional: Negative.   Respiratory: Negative.   Cardiovascular: Negative.     Per HPI unless specifically indicated above     Objective:    BP 138/83 (BP Location: Left Arm, Patient Position: Sitting, Cuff Size: Normal)   Pulse 66   Temp 97.5 F (36.4 C)   Ht 5\' 2"  (1.575 m)   Wt 201 lb (91.2 kg)   SpO2 99%   BMI 36.76 kg/m   Wt Readings from Last 3 Encounters:  06/12/16 201 lb (91.2 kg)  04/22/16 200 lb (90.7 kg)  03/13/16 204 lb (92.5 kg)    Physical Exam  Constitutional: She is oriented to person, place, and time. She appears well-developed and well-nourished. No distress.  HENT:  Head: Normocephalic and atraumatic.  Right Ear: Hearing normal.  Left Ear: Hearing normal.  Nose: Nose normal.  Eyes: Conjunctivae and lids are normal. Right eye exhibits no discharge. Left eye exhibits no discharge. No scleral icterus.  Cardiovascular: Normal rate, regular rhythm and normal heart sounds.   Pulmonary/Chest: Effort normal and breath sounds normal. No  respiratory distress.  Musculoskeletal: Normal range of motion.  Neurological: She is alert and oriented to person, place, and time.  Skin: Skin is intact. No rash noted.  Psychiatric: She has a normal mood and affect. Her speech is normal and behavior is normal. Judgment and thought content normal. Cognition and memory are normal.    Results for orders placed or performed in visit on 06/12/16  Microalbumin, urine  Result Value Ref Range   Microalb, Ur 30   Hemoglobin A1c  Result Value Ref Range   Hemoglobin A1C 7.8       Assessment & Plan:   Problem List Items Addressed This Visit      Cardiovascular and Mediastinum   Hypertension    The current medical regimen is effective;  continue present plan and medications.       Relevant Medications   lovastatin (MEVACOR) 20 MG tablet   hydrochlorothiazide (HYDRODIURIL) 25 MG tablet   carvedilol (COREG) 25 MG tablet   benazepril (LOTENSIN) 40 MG tablet     Endocrine   Diabetes mellitus without complication (HCC) - Primary   Relevant Medications   saxagliptin HCl (ONGLYZA) 5 MG TABS tablet   lovastatin (MEVACOR) 20 MG tablet   glimepiride (AMARYL) 4 MG tablet   Dapagliflozin-Metformin HCl ER (XIGDUO XR) 02-999 MG TB24   benazepril (LOTENSIN) 40 MG tablet   Other Relevant Orders   Bayer DCA Hb A1c Waived  Microalbumin, Urine Waived     Other   Hyperlipidemia    The current medical regimen is effective;  continue present plan and medications.       Relevant Medications   lovastatin (MEVACOR) 20 MG tablet   hydrochlorothiazide (HYDRODIURIL) 25 MG tablet   carvedilol (COREG) 25 MG tablet   benazepril (LOTENSIN) 40 MG tablet    Other Visit Diagnoses   None.      Follow up plan: Return in about 3 months (around 09/12/2016) for Hemoglobin A1c, BMP,  Lipids, ALT, AST.

## 2016-06-12 NOTE — Assessment & Plan Note (Signed)
The current medical regimen is effective;  continue present plan and medications.  

## 2016-09-16 ENCOUNTER — Encounter: Payer: Self-pay | Admitting: Family Medicine

## 2016-09-16 ENCOUNTER — Ambulatory Visit (INDEPENDENT_AMBULATORY_CARE_PROVIDER_SITE_OTHER): Payer: BLUE CROSS/BLUE SHIELD | Admitting: Family Medicine

## 2016-09-16 VITALS — BP 114/72 | HR 72 | Temp 98.2°F | Ht 61.3 in | Wt 199.4 lb

## 2016-09-16 DIAGNOSIS — E785 Hyperlipidemia, unspecified: Secondary | ICD-10-CM | POA: Diagnosis not present

## 2016-09-16 DIAGNOSIS — Z23 Encounter for immunization: Secondary | ICD-10-CM | POA: Diagnosis not present

## 2016-09-16 DIAGNOSIS — E119 Type 2 diabetes mellitus without complications: Secondary | ICD-10-CM

## 2016-09-16 DIAGNOSIS — I10 Essential (primary) hypertension: Secondary | ICD-10-CM | POA: Diagnosis not present

## 2016-09-16 MED ORDER — DULAGLUTIDE 1.5 MG/0.5ML ~~LOC~~ SOAJ: 1.5000 mg | SUBCUTANEOUS | 12 refills | Status: DC

## 2016-09-16 NOTE — Assessment & Plan Note (Signed)
Discussed diabetes poor control will start Trulicity patient gave first injection here in the office gave sample for another 7.5. Gave prescription for 1.5 patient will give weekly.

## 2016-09-16 NOTE — Patient Instructions (Signed)

## 2016-09-16 NOTE — Assessment & Plan Note (Signed)
The current medical regimen is effective;  continue present plan and medications.  

## 2016-09-16 NOTE — Progress Notes (Signed)
BP 114/72 (BP Location: Left Arm, Patient Position: Sitting, Cuff Size: Large)   Pulse 72   Temp 98.2 F (36.8 C)   Ht 5' 1.3" (1.557 m)   Wt 199 lb 6.4 oz (90.4 kg)   SpO2 99%   BMI 37.31 kg/m    Subjective:    Patient ID: Amanda [name], female    DOB: 02/14/1956, 60 y.o.   MRN: [id number]  HPI: Amanda GuiseRobin Gonsalves is a 60 y.o. female  Chief Complaint  Patient presents with  . Diabetes    pt states she does not know when last eye exam was  . Hyperlipidemia  . Hypertension   Blood pressure cholesterol doing well no complaints from medications taken faithfully without side effects Diabetes doing well with no low blood sugar spells but glucose is been elevated pretty much consistently. Taking medications on a regular basis. Also discussed importance of eye exam patient will get it. Relevant past medical, surgical, family and social history reviewed and updated as indicated. Interim medical history since our last visit reviewed. Allergies and medications reviewed and updated.  Review of Systems  Constitutional: Negative.   Respiratory: Negative.   Cardiovascular: Negative.     Per HPI unless specifically indicated above     Objective:    BP 114/72 (BP Location: Left Arm, Patient Position: Sitting, Cuff Size: Large)   Pulse 72   Temp 98.2 F (36.8 C)   Ht 5' 1.3" (1.557 m)   Wt 199 lb 6.4 oz (90.4 kg)   SpO2 99%   BMI 37.31 kg/m   Wt Readings from Last 3 Encounters:  09/16/16 199 lb 6.4 oz (90.4 kg)  06/12/16 201 lb (91.2 kg)  04/22/16 200 lb (90.7 kg)    Physical Exam  Constitutional: She is oriented to person, place, and time. She appears well-developed and well-nourished. No distress.  HENT:  Head: Normocephalic and atraumatic.  Right Ear: Hearing normal.  Left Ear: Hearing normal.  Nose: Nose normal.  Eyes: Conjunctivae and lids are normal. Right eye exhibits no discharge. Left eye exhibits no discharge. No scleral icterus.  Cardiovascular: Normal rate, regular  rhythm and normal heart sounds.   Pulmonary/Chest: Effort normal and breath sounds normal. No respiratory distress.  Musculoskeletal: Normal range of motion.  Neurological: She is alert and oriented to person, place, and time.  Skin: Skin is intact. No rash noted.  Psychiatric: She has a normal mood and affect. Her speech is normal and behavior is normal. Judgment and thought content normal. Cognition and memory are normal.    Results for orders placed or performed in visit on 06/12/16  Bayer DCA Hb A1c Waived  Result Value Ref Range   Bayer DCA Hb A1c Waived 7.8 (H) <7.0 %  Microalbumin, Urine Waived  Result Value Ref Range   Microalb, Ur Waived 30 (H) 0 - 19 mg/L   Creatinine, Urine Waived 50 10 - 300 mg/dL   Microalb/Creat Ratio 30-300 (H) <30 mg/g  Microalbumin, urine  Result Value Ref Range   Microalb, Ur 30   Hemoglobin A1c  Result Value Ref Range   Hemoglobin A1C 7.8       Assessment & Plan:   Problem List Items Addressed This Visit      Cardiovascular and Mediastinum   Hypertension    The current medical regimen is effective;  continue present plan and medications.       Relevant Orders   Basic metabolic panel     Endocrine   Diabetes mellitus  without complication (HCC) - Primary    Discussed diabetes poor control will start Trulicity patient gave first injection here in the office gave sample for another 7.5. Gave prescription for 1.5 patient will give weekly.      Relevant Medications   Dulaglutide (TRULICITY) 1.5 MG/0.5ML SOPN   Other Relevant Orders   Bayer DCA Hb A1c Waived     Other   Hyperlipidemia    The current medical regimen is effective;  continue present plan and medications.       Relevant Orders   LP+ALT+AST Piccolo, Barnes    Other Visit Diagnoses    Need for influenza vaccination       Relevant Orders   Flu Vaccine QUAD 36+ mos IM (Completed)       Follow up plan: Return in about 3 months (around 12/17/2016) for Physical Exam,  Hemoglobin A1c.

## 2016-09-17 ENCOUNTER — Encounter: Payer: Self-pay | Admitting: Family Medicine

## 2016-09-17 LAB — BASIC METABOLIC PANEL
BUN/Creatinine Ratio: 22 (ref 12–28)
BUN: 22 mg/dL (ref 8–27)
CALCIUM: 9.2 mg/dL (ref 8.7–10.3)
CHLORIDE: 103 mmol/L (ref 96–106)
CO2: 25 mmol/L (ref 18–29)
Creatinine, Ser: 0.98 mg/dL (ref 0.57–1.00)
GFR calc Af Amer: 72 mL/min/{1.73_m2} (ref >59)
GFR calc non Af Amer: 62 mL/min/{1.73_m2} (ref >59)
GLUCOSE: 149 mg/dL — AB (ref 65–99)
POTASSIUM: 4.3 mmol/L (ref 3.5–5.2)
Sodium: 142 mmol/L (ref 134–144)

## 2016-09-17 LAB — LP+ALT+AST PICCOLO, WAIVED
ALT (SGPT) PICCOLO, WAIVED: 35 U/L (ref 10–47)
AST (SGOT) Piccolo, Waived: 33 U/L (ref 11–38)
CHOLESTEROL PICCOLO, WAIVED: 148 mg/dL (ref <200)
Chol/HDL Ratio Piccolo,Waive: 2.5 mg/dL
HDL CHOL PICCOLO, WAIVED: 60 mg/dL (ref >59)
LDL Chol Calc Piccolo Waived: 48 mg/dL (ref <100)
Triglycerides Piccolo,Waived: 199 mg/dL — ABNORMAL HIGH (ref <150)
VLDL CHOL CALC PICCOLO,WAIVE: 40 mg/dL — AB (ref <30)

## 2016-09-17 LAB — BAYER DCA HB A1C WAIVED: HB A1C: 7.8 % — AB (ref <7.0)

## 2016-10-24 ENCOUNTER — Other Ambulatory Visit: Payer: Self-pay

## 2016-10-24 MED ORDER — DAPAGLIFLOZIN PRO-METFORMIN ER 5-1000 MG PO TB24: 2.0000 | ORAL_TABLET | Freq: Every day | ORAL | 0 refills | Status: DC

## 2016-10-24 NOTE — Telephone Encounter (Signed)
Last OV: 09/16/16 Next OV: 01/07/17   Lab Results  Component Value Date   HGBA1C 7.8 06/12/2016

## 2016-10-28 ENCOUNTER — Telehealth: Payer: Self-pay | Admitting: Family Medicine

## 2016-10-28 NOTE — Telephone Encounter (Signed)
Patient called to give the phone number we need to call for prior auth for the patients medication xigduo XR 5/1000mg . Per patient [facility] someone needs to call the insurance company to prior auth the med  Ins  [phone]   Thank You Clydie Braun

## 2016-10-28 NOTE — Telephone Encounter (Signed)
Initiated via Cover my meds. Key # [id number]

## 2016-11-15 ENCOUNTER — Telehealth: Payer: Self-pay | Admitting: Family Medicine

## 2016-11-16 NOTE — Telephone Encounter (Signed)
Patient called to check the status of the prior auth for Xigduo XR 5/1000mg . Prior Berkley Harvey was denied by the insurance .  Patient will need a new RX or additional documentation  from provider as a Medical Necessity.  Per prior Luan Moore is  covered by the patients insurance.   Please call patient to advise.

## 2016-11-18 MED ORDER — EMPAGLIFLOZIN-METFORMIN HCL ER 12.5-1000 MG PO TB24: 12.5000 mg | ORAL_TABLET | Freq: Two times a day (BID) | ORAL | 7 refills | Status: DC

## 2016-11-18 NOTE — Telephone Encounter (Signed)
rx changed

## 2016-11-18 NOTE — Telephone Encounter (Signed)
P.A. For xigduo was denied. Will need a different medication prescribed, or will need a letter of medical necessity written. Please advise.

## 2017-01-07 ENCOUNTER — Ambulatory Visit (INDEPENDENT_AMBULATORY_CARE_PROVIDER_SITE_OTHER): Payer: BLUE CROSS/BLUE SHIELD | Admitting: Family Medicine

## 2017-01-07 ENCOUNTER — Encounter: Payer: Self-pay | Admitting: Family Medicine

## 2017-01-07 VITALS — BP 127/76 | HR 71 | Ht 61.14 in | Wt 186.3 lb

## 2017-01-07 DIAGNOSIS — Z1159 Encounter for screening for other viral diseases: Secondary | ICD-10-CM | POA: Diagnosis not present

## 2017-01-07 DIAGNOSIS — E119 Type 2 diabetes mellitus without complications: Secondary | ICD-10-CM

## 2017-01-07 DIAGNOSIS — Z0000 Encounter for general adult medical examination without abnormal findings: Secondary | ICD-10-CM | POA: Diagnosis not present

## 2017-01-07 DIAGNOSIS — Z114 Encounter for screening for human immunodeficiency virus [HIV]: Secondary | ICD-10-CM | POA: Diagnosis not present

## 2017-01-07 DIAGNOSIS — E785 Hyperlipidemia, unspecified: Secondary | ICD-10-CM

## 2017-01-07 DIAGNOSIS — I10 Essential (primary) hypertension: Secondary | ICD-10-CM | POA: Diagnosis not present

## 2017-01-07 DIAGNOSIS — Z1329 Encounter for screening for other suspected endocrine disorder: Secondary | ICD-10-CM

## 2017-01-07 LAB — URINALYSIS, ROUTINE W REFLEX MICROSCOPIC
Bilirubin, UA: NEGATIVE
KETONES UA: NEGATIVE
Nitrite, UA: POSITIVE — AB
PH UA: 5.5 (ref 5.0–7.5)
Protein, UA: NEGATIVE
RBC, UA: NEGATIVE
SPEC GRAV UA: 1.015 (ref 1.005–1.030)
Urobilinogen, Ur: 0.2 mg/dL (ref 0.2–1.0)

## 2017-01-07 LAB — MICROSCOPIC EXAMINATION: RBC, UA: NONE SEEN /hpf (ref 0–?)

## 2017-01-07 LAB — HEMOGLOBIN A1C
ESTIMATED AVERAGE GLUCOSE: 160 mg/dL
HEMOGLOBIN A1C: 7.2 % — AB (ref 4.8–5.6)

## 2017-01-07 MED ORDER — TRAZODONE HCL 50 MG PO TABS: 50.0000 mg | ORAL_TABLET | Freq: Every day | ORAL | 12 refills | Status: DC

## 2017-01-07 MED ORDER — EMPAGLIFLOZIN-METFORMIN HCL ER 12.5-1000 MG PO TB24: 12.5000 mg | ORAL_TABLET | Freq: Two times a day (BID) | ORAL | 12 refills | Status: DC

## 2017-01-07 MED ORDER — DULAGLUTIDE 1.5 MG/0.5ML ~~LOC~~ SOAJ: 1.5000 mg | SUBCUTANEOUS | 12 refills | Status: DC

## 2017-01-07 MED ORDER — SAXAGLIPTIN HCL 5 MG PO TABS: 5.0000 mg | ORAL_TABLET | Freq: Every day | ORAL | 12 refills | Status: DC

## 2017-01-07 MED ORDER — HYDROCHLOROTHIAZIDE 25 MG PO TABS: 25.0000 mg | ORAL_TABLET | Freq: Every day | ORAL | 12 refills | Status: DC

## 2017-01-07 MED ORDER — MELOXICAM 15 MG PO TABS: 15.0000 mg | ORAL_TABLET | Freq: Every day | ORAL | 12 refills | Status: DC

## 2017-01-07 MED ORDER — LOVASTATIN 20 MG PO TABS: 40.0000 mg | ORAL_TABLET | Freq: Every day | ORAL | 12 refills | Status: DC

## 2017-01-07 MED ORDER — GLIMEPIRIDE 4 MG PO TABS: 4.0000 mg | ORAL_TABLET | Freq: Every day | ORAL | 12 refills | Status: DC

## 2017-01-07 MED ORDER — BENAZEPRIL HCL 40 MG PO TABS: 40.0000 mg | ORAL_TABLET | Freq: Every day | ORAL | 6 refills | Status: DC

## 2017-01-07 MED ORDER — CARVEDILOL 25 MG PO TABS: 25.0000 mg | ORAL_TABLET | Freq: Two times a day (BID) | ORAL | 6 refills | Status: DC

## 2017-01-07 NOTE — Progress Notes (Signed)
BP 127/76   Pulse 71   Ht 5' 1.14" (1.553 m)   Wt 186 lb 4.8 oz (84.5 kg)   SpO2 99%   BMI 35.04 kg/m    Subjective:    Patient ID: Amanda [name], female    DOB: 1956-07-08, 61 y.o.   MRN: [id number]  HPI: Amanda [name] is a 61 y.o. female  Chief Complaint  Patient presents with  . Annual Exam  . Diabetes   Patient all in all medically doing better with nice weight loss and best control of diabetes she's had in over a year. Unfortunately the weight loss is due to stress and not eating because of stress. Stress may be let in up. At least a little bit. Other medical problems blood pressure doing well no complaints from medications taken faithfully Cholesterol also doing well no complaints from medications taken faithfully.  Relevant past medical, surgical, family and social history reviewed and updated as indicated. Interim medical history since our last visit reviewed. Allergies and medications reviewed and updated.  Review of Systems  Constitutional: Negative.   HENT: Negative.   Eyes: Negative.   Respiratory: Negative.   Cardiovascular: Negative.   Gastrointestinal: Negative.   Endocrine: Negative.   Genitourinary: Negative.   Musculoskeletal: Negative.   Skin: Negative.   Allergic/Immunologic: Negative.   Neurological: Negative.   Hematological: Negative.   Psychiatric/Behavioral: Negative.     Per HPI unless specifically indicated above     Objective:    BP 127/76   Pulse 71   Ht 5' 1.14" (1.553 m)   Wt 186 lb 4.8 oz (84.5 kg)   SpO2 99%   BMI 35.04 kg/m   Wt Readings from Last 3 Encounters:  01/07/17 186 lb 4.8 oz (84.5 kg)  09/16/16 199 lb 6.4 oz (90.4 kg)  06/12/16 201 lb (91.2 kg)    Physical Exam  Constitutional: She is oriented to person, place, and time. She appears well-developed and well-nourished.  HENT:  Head: Normocephalic and atraumatic.  Right Ear: External ear normal.  Left Ear: External ear normal.  Nose: Nose normal.  Mouth/Throat:  Oropharynx is clear and moist.  Eyes: Conjunctivae and EOM are normal. Pupils are equal, round, and reactive to light.  Neck: Normal range of motion. Neck supple. Carotid bruit is not present.  Cardiovascular: Normal rate, regular rhythm and normal heart sounds.   No murmur heard. Pulmonary/Chest: Effort normal and breath sounds normal. She exhibits no mass. Right breast exhibits no mass, no skin change and no tenderness. Left breast exhibits no mass, no skin change and no tenderness. Breasts are symmetrical.  Abdominal: Soft. Bowel sounds are normal. There is no hepatosplenomegaly.  Musculoskeletal: Normal range of motion.  Neurological: She is alert and oriented to person, place, and time.  Skin: No rash noted.  Psychiatric: She has a normal mood and affect. Her behavior is normal. Judgment and thought content normal.    Results for orders placed or performed in visit on 09/16/16  Bayer DCA Hb A1c Waived  Result Value Ref Range   Bayer DCA Hb A1c Waived 7.8 (H) <7.0 %  Basic metabolic panel  Result Value Ref Range   Glucose 149 (H) 65 - 99 mg/dL   BUN 22 8 - 27 mg/dL   Creatinine, Ser 9.62 0.57 - 1.00 mg/dL   GFR calc non Af Amer 62 >59 mL/min/1.73   GFR calc Af Amer 72 >59 mL/min/1.73   BUN/Creatinine Ratio 22 12 - 28   Sodium 142 134 -  144 mmol/L   Potassium 4.3 3.5 - 5.2 mmol/L   Chloride 103 96 - 106 mmol/L   CO2 25 18 - 29 mmol/L   Calcium 9.2 8.7 - 10.3 mg/dL  LP+ALT+AST Piccolo, Waived  Result Value Ref Range   ALT (SGPT) Piccolo, Waived 35 10 - 47 U/L   AST (SGOT) Piccolo, Waived 33 11 - 38 U/L   Cholesterol Piccolo, Waived 148 <200 mg/dL   HDL Chol Piccolo, Waived 60 >59 mg/dL   Triglycerides Piccolo,Waived 199 (H) <150 mg/dL   Chol/HDL Ratio Piccolo,Waive 2.5 mg/dL   LDL Chol Calc Piccolo Waived 48 <100 mg/dL   VLDL Chol Calc Piccolo,Waive 40 (H) <30 mg/dL      Assessment & Plan:   Problem List Items Addressed This Visit      Cardiovascular and Mediastinum    Hypertension    The current medical regimen is effective;  continue present plan and medications.       Relevant Medications   hydrochlorothiazide (HYDRODIURIL) 25 MG tablet   lovastatin (MEVACOR) 20 MG tablet   benazepril (LOTENSIN) 40 MG tablet   carvedilol (COREG) 25 MG tablet   Other Relevant Orders   Hemoglobin A1c   CBC with Differential/Platelet   Comprehensive metabolic panel   Lipid panel   Urinalysis, Routine w reflex microscopic     Endocrine   Diabetes mellitus without complication (HCC)    Marked improvement with weight loss will continue weight loss and diet modification continue current medications.      Relevant Medications   glimepiride (AMARYL) 4 MG tablet   lovastatin (MEVACOR) 20 MG tablet   saxagliptin HCl (ONGLYZA) 5 MG TABS tablet   benazepril (LOTENSIN) 40 MG tablet   Dulaglutide (TRULICITY) 1.5 MG/0.5ML SOPN   Empagliflozin-Metformin HCl ER (SYNJARDY XR) 12.02-999 MG TB24   Other Relevant Orders   Hemoglobin A1c   CBC with Differential/Platelet   Comprehensive metabolic panel   Lipid panel   Urinalysis, Routine w reflex microscopic     Other   Hyperlipidemia    The current medical regimen is effective;  continue present plan and medications.       Relevant Medications   hydrochlorothiazide (HYDRODIURIL) 25 MG tablet   lovastatin (MEVACOR) 20 MG tablet   benazepril (LOTENSIN) 40 MG tablet   carvedilol (COREG) 25 MG tablet   Other Relevant Orders   Hemoglobin A1c   CBC with Differential/Platelet   Comprehensive metabolic panel   Lipid panel   Urinalysis, Routine w reflex microscopic    Other Visit Diagnoses    Annual physical exam    -  Primary   Relevant Orders   Hemoglobin A1c   CBC with Differential/Platelet   Comprehensive metabolic panel   Lipid panel   TSH   Urinalysis, Routine w reflex microscopic   Thyroid disorder screen       Relevant Orders   TSH   Need for hepatitis C screening test       Relevant Orders    Hepatitis C antibody   Encounter for screening for HIV       Relevant Orders   HIV antibody       Follow up plan: Return in about 3 months (around 04/09/2017) for Hemoglobin A1c.

## 2017-01-07 NOTE — Assessment & Plan Note (Signed)
Marked improvement with weight loss will continue weight loss and diet modification continue current medications.

## 2017-01-07 NOTE — Assessment & Plan Note (Signed)
The current medical regimen is effective;  continue present plan and medications.  

## 2017-01-08 LAB — COMPREHENSIVE METABOLIC PANEL
ALBUMIN: 4.1 g/dL (ref 3.6–4.8)
ALK PHOS: 62 IU/L (ref 39–117)
ALT: 22 IU/L (ref 0–32)
AST: 24 IU/L (ref 0–40)
Albumin/Globulin Ratio: 1.6 (ref 1.2–2.2)
BILIRUBIN TOTAL: 0.3 mg/dL (ref 0.0–1.2)
BUN / CREAT RATIO: 25 (ref 12–28)
BUN: 24 mg/dL (ref 8–27)
CHLORIDE: 98 mmol/L (ref 96–106)
CO2: 26 mmol/L (ref 18–29)
Calcium: 9.6 mg/dL (ref 8.7–10.3)
Creatinine, Ser: 0.95 mg/dL (ref 0.57–1.00)
GFR calc non Af Amer: 65 mL/min/{1.73_m2} (ref >59)
GFR, EST AFRICAN AMERICAN: 75 mL/min/{1.73_m2} (ref >59)
GLOBULIN, TOTAL: 2.6 g/dL (ref 1.5–4.5)
GLUCOSE: 153 mg/dL — AB (ref 65–99)
Potassium: 3.8 mmol/L (ref 3.5–5.2)
SODIUM: 141 mmol/L (ref 134–144)
TOTAL PROTEIN: 6.7 g/dL (ref 6.0–8.5)

## 2017-01-08 LAB — LIPID PANEL
CHOLESTEROL TOTAL: 144 mg/dL (ref 100–199)
Chol/HDL Ratio: 3.1 ratio units (ref 0.0–4.4)
HDL: 47 mg/dL (ref >39)
LDL CALC: 60 mg/dL (ref 0–99)
Triglycerides: 187 mg/dL — ABNORMAL HIGH (ref 0–149)
VLDL CHOLESTEROL CAL: 37 mg/dL (ref 5–40)

## 2017-01-08 LAB — CBC WITH DIFFERENTIAL/PLATELET
Basophils Absolute: 0 10*3/uL (ref 0.0–0.2)
Basos: 0 %
EOS (ABSOLUTE): 0.2 10*3/uL (ref 0.0–0.4)
Eos: 4 %
HEMATOCRIT: 34.4 % (ref 34.0–46.6)
HEMOGLOBIN: 11.7 g/dL (ref 11.1–15.9)
IMMATURE GRANS (ABS): 0 10*3/uL (ref 0.0–0.1)
Immature Granulocytes: 0 %
LYMPHS ABS: 1.7 10*3/uL (ref 0.7–3.1)
LYMPHS: 35 %
MCH: 30.7 pg (ref 26.6–33.0)
MCHC: 34 g/dL (ref 31.5–35.7)
MCV: 90 fL (ref 79–97)
MONOCYTES: 8 %
Monocytes Absolute: 0.4 10*3/uL (ref 0.1–0.9)
NEUTROS ABS: 2.6 10*3/uL (ref 1.4–7.0)
Neutrophils: 53 %
Platelets: 198 10*3/uL (ref 150–379)
RBC: 3.81 x10E6/uL (ref 3.77–5.28)
RDW: 14.2 % (ref 12.3–15.4)
WBC: 4.9 10*3/uL (ref 3.4–10.8)

## 2017-01-08 LAB — HEPATITIS C ANTIBODY: Hep C Virus Ab: <0.1 s/co ratio (ref 0.0–0.9)

## 2017-01-08 LAB — TSH: TSH: 1.83 u[IU]/mL (ref 0.450–4.500)

## 2017-01-13 ENCOUNTER — Encounter: Payer: Self-pay | Admitting: Family Medicine

## 2017-04-15 ENCOUNTER — Encounter: Payer: Self-pay | Admitting: Family Medicine

## 2017-04-15 ENCOUNTER — Ambulatory Visit (INDEPENDENT_AMBULATORY_CARE_PROVIDER_SITE_OTHER): Payer: BLUE CROSS/BLUE SHIELD | Admitting: Family Medicine

## 2017-04-15 VITALS — BP 124/82 | HR 68 | Wt 183.0 lb

## 2017-04-15 DIAGNOSIS — E785 Hyperlipidemia, unspecified: Secondary | ICD-10-CM

## 2017-04-15 DIAGNOSIS — I10 Essential (primary) hypertension: Secondary | ICD-10-CM | POA: Diagnosis not present

## 2017-04-15 DIAGNOSIS — E119 Type 2 diabetes mellitus without complications: Secondary | ICD-10-CM | POA: Diagnosis not present

## 2017-04-15 NOTE — Assessment & Plan Note (Signed)
The current medical regimen is effective;  continue present plan and medications.  

## 2017-04-15 NOTE — Progress Notes (Signed)
BP 124/82   Pulse 68   Wt 183 lb (83 kg)   SpO2 98%   BMI 34.42 kg/m    Subjective:    Patient ID: Amanda [name], female    DOB: April 03, 1956, 60 y.o.   MRN: [id number]  HPI: Amanda [name] is a 61 y.o. female  Recheck DM Patient recheck diabetes has lost weight nicely some of it unintentional some of it by trying and doing well with blood sugars noted low blood sugar spells. Blood pressure doing well no complaints. Cholesterol also doing well.  Relevant past medical, surgical, family and social history reviewed and updated as indicated. Interim medical history since our last visit reviewed. Allergies and medications reviewed and updated.  Review of Systems  Constitutional: Negative.   Respiratory: Negative.   Cardiovascular: Negative.     Per HPI unless specifically indicated above     Objective:    BP 124/82   Pulse 68   Wt 183 lb (83 kg)   SpO2 98%   BMI 34.42 kg/m   Wt Readings from Last 3 Encounters:  04/15/17 183 lb (83 kg)  01/07/17 186 lb 4.8 oz (84.5 kg)  09/16/16 199 lb 6.4 oz (90.4 kg)    Physical Exam  Constitutional: She is oriented to person, place, and time. She appears well-developed and well-nourished.  HENT:  Head: Normocephalic and atraumatic.  Eyes: Conjunctivae and EOM are normal.  Neck: Normal range of motion.  Cardiovascular: Normal rate, regular rhythm and normal heart sounds.   Pulmonary/Chest: Effort normal and breath sounds normal.  Musculoskeletal: Normal range of motion.  Neurological: She is alert and oriented to person, place, and time.  Skin: No erythema.  Psychiatric: She has a normal mood and affect. Her behavior is normal. Judgment and thought content normal.    Results for orders placed or performed in visit on 01/07/17  Microscopic Examination  Result Value Ref Range   WBC, UA >30 (H) 0 - 5 /hpf   RBC, UA None seen 0 - 2 /hpf   Epithelial Cells (non renal) 0-10 0 - 10 /hpf   Bacteria, UA Moderate (A) None seen/Few    Hemoglobin A1c  Result Value Ref Range   Hgb A1c MFr Bld 7.2 (H) 4.8 - 5.6 %   Est. average glucose Bld gHb Est-mCnc 160 mg/dL  CBC with Differential/Platelet  Result Value Ref Range   WBC 4.9 3.4 - 10.8 x10E3/uL   RBC 3.81 3.77 - 5.28 x10E6/uL   Hemoglobin 11.7 11.1 - 15.9 g/dL   Hematocrit 53.6 46.8 - 46.6 %   MCV 90 79 - 97 fL   MCH 30.7 26.6 - 33.0 pg   MCHC 34.0 31.5 - 35.7 g/dL   RDW 03.2 12.2 - 48.2 %   Platelets 198 150 - 379 x10E3/uL   Neutrophils 53 Not Estab. %   Lymphs 35 Not Estab. %   Monocytes 8 Not Estab. %   Eos 4 Not Estab. %   Basos 0 Not Estab. %   Neutrophils Absolute 2.6 1.4 - 7.0 x10E3/uL   Lymphocytes Absolute 1.7 0.7 - 3.1 x10E3/uL   Monocytes Absolute 0.4 0.1 - 0.9 x10E3/uL   EOS (ABSOLUTE) 0.2 0.0 - 0.4 x10E3/uL   Basophils Absolute 0.0 0.0 - 0.2 x10E3/uL   Immature Granulocytes 0 Not Estab. %   Immature Grans (Abs) 0.0 0.0 - 0.1 x10E3/uL  Comprehensive metabolic panel  Result Value Ref Range   Glucose 153 (H) 65 - 99 mg/dL   BUN 24  8 - 27 mg/dL   Creatinine, Ser 1.02 0.57 - 1.00 mg/dL   GFR calc non Af Amer 65 >59 mL/min/1.73   GFR calc Af Amer 75 >59 mL/min/1.73   BUN/Creatinine Ratio 25 12 - 28   Sodium 141 134 - 144 mmol/L   Potassium 3.8 3.5 - 5.2 mmol/L   Chloride 98 96 - 106 mmol/L   CO2 26 18 - 29 mmol/L   Calcium 9.6 8.7 - 10.3 mg/dL   Total Protein 6.7 6.0 - 8.5 g/dL   Albumin 4.1 3.6 - 4.8 g/dL   Globulin, Total 2.6 1.5 - 4.5 g/dL   Albumin/Globulin Ratio 1.6 1.2 - 2.2   Bilirubin Total 0.3 0.0 - 1.2 mg/dL   Alkaline Phosphatase 62 39 - 117 IU/L   AST 24 0 - 40 IU/L   ALT 22 0 - 32 IU/L  Lipid panel  Result Value Ref Range   Cholesterol, Total 144 100 - 199 mg/dL   Triglycerides 725 (H) 0 - 149 mg/dL   HDL 47 >36 mg/dL   VLDL Cholesterol Cal 37 5 - 40 mg/dL   LDL Calculated 60 0 - 99 mg/dL   Chol/HDL Ratio 3.1 0.0 - 4.4 ratio units  TSH  Result Value Ref Range   TSH 1.830 0.450 - 4.500 uIU/mL  Urinalysis, Routine w  reflex microscopic  Result Value Ref Range   Specific Gravity, UA 1.015 1.005 - 1.030   pH, UA 5.5 5.0 - 7.5   Color, UA Yellow Yellow   Appearance Ur Cloudy (A) Clear   Leukocytes, UA 1+ (A) Negative   Protein, UA Negative Negative/Trace   Glucose, UA 2+ (A) Negative   Ketones, UA Negative Negative   RBC, UA Negative Negative   Bilirubin, UA Negative Negative   Urobilinogen, Ur 0.2 0.2 - 1.0 mg/dL   Nitrite, UA Positive (A) Negative   Microscopic Examination See below:   Hepatitis C antibody  Result Value Ref Range   Hep C Virus Ab <0.1 0.0 - 0.9 s/co ratio      Assessment & Plan:   Problem List Items Addressed This Visit      Cardiovascular and Mediastinum   Hypertension - Primary    The current medical regimen is effective;  continue present plan and medications.       Relevant Orders   Bayer DCA Hb A1c Waived     Endocrine   Diabetes mellitus without complication (HCC)    The current medical regimen is effective;  continue present plan and medications.       Relevant Orders   Bayer DCA Hb A1c Waived     Other   Hyperlipidemia    The current medical regimen is effective;  continue present plan and medications.       Relevant Orders   Bayer DCA Hb A1c Waived       Follow up plan: Return in about 3 months (around 07/16/2017) for Hemoglobin A1c, BMP,  Lipids, ALT, AST.

## 2017-04-16 LAB — BAYER DCA HB A1C WAIVED: HB A1C: 6.9 % (ref <7.0)

## 2017-06-01 ENCOUNTER — Other Ambulatory Visit: Payer: Self-pay | Admitting: Family Medicine

## 2017-07-16 ENCOUNTER — Encounter: Payer: Self-pay | Admitting: Family Medicine

## 2017-07-16 ENCOUNTER — Ambulatory Visit (INDEPENDENT_AMBULATORY_CARE_PROVIDER_SITE_OTHER): Payer: BLUE CROSS/BLUE SHIELD | Admitting: Family Medicine

## 2017-07-16 VITALS — BP 120/80 | HR 70 | Wt 187.0 lb

## 2017-07-16 DIAGNOSIS — Z23 Encounter for immunization: Secondary | ICD-10-CM

## 2017-07-16 DIAGNOSIS — E119 Type 2 diabetes mellitus without complications: Secondary | ICD-10-CM | POA: Diagnosis not present

## 2017-07-16 DIAGNOSIS — E785 Hyperlipidemia, unspecified: Secondary | ICD-10-CM | POA: Diagnosis not present

## 2017-07-16 DIAGNOSIS — I10 Essential (primary) hypertension: Secondary | ICD-10-CM | POA: Diagnosis not present

## 2017-07-16 LAB — LP+ALT+AST PICCOLO, WAIVED
ALT (SGPT) Piccolo, Waived: 33 U/L (ref 10–47)
AST (SGOT) Piccolo, Waived: 28 U/L (ref 11–38)
CHOL/HDL RATIO PICCOLO,WAIVE: 3.4 mg/dL
CHOLESTEROL PICCOLO, WAIVED: 196 mg/dL (ref <200)
HDL CHOL PICCOLO, WAIVED: 57 mg/dL — AB (ref >59)
LDL CHOL CALC PICCOLO WAIVED: 78 mg/dL (ref <100)
TRIGLYCERIDES PICCOLO,WAIVED: 301 mg/dL — AB (ref <150)
VLDL CHOL CALC PICCOLO,WAIVE: 60 mg/dL — AB (ref <30)

## 2017-07-16 LAB — BAYER DCA HB A1C WAIVED: HB A1C: 6.3 % (ref <7.0)

## 2017-07-16 MED ORDER — CARVEDILOL 25 MG PO TABS: 25.0000 mg | ORAL_TABLET | Freq: Two times a day (BID) | ORAL | 6 refills | Status: DC

## 2017-07-16 MED ORDER — BENAZEPRIL HCL 40 MG PO TABS: 40.0000 mg | ORAL_TABLET | Freq: Every day | ORAL | 6 refills | Status: DC

## 2017-07-16 NOTE — Progress Notes (Signed)
BP 120/80   Pulse 70   Wt 187 lb (84.8 kg)   SpO2 99%   BMI 35.17 kg/m    Subjective:    Patient ID: Amanda [name], female    DOB: 23-Dec-1955, 61 y.o.   MRN: [id number]  HPI: [name] is a 61 y.o. female  Chief Complaint  Patient presents with  . Follow-up  . Hypertension  . Hyperlipidemia  . Diabetes  Patient all in all doing well taking blood pressure medicines without problems and good control. Diabetes doing well noted low blood sugar spells or issues with medications taken faithfully and good control. Same for cholesterol and medications doing well without problems. Sleeping still doing okay with trazodone  Relevant past medical, surgical, family and social history reviewed and updated as indicated. Interim medical history since our last visit reviewed. Allergies and medications reviewed and updated.  Review of Systems  Constitutional: Negative.   Respiratory: Negative.   Cardiovascular: Negative.     Per HPI unless specifically indicated above     Objective:    BP 120/80   Pulse 70   Wt 187 lb (84.8 kg)   SpO2 99%   BMI 35.17 kg/m   Wt Readings from Last 3 Encounters:  07/16/17 187 lb (84.8 kg)  04/15/17 183 lb (83 kg)  01/07/17 186 lb 4.8 oz (84.5 kg)    Physical Exam  Constitutional: She is oriented to person, place, and time. She appears well-developed and well-nourished.  HENT:  Head: Normocephalic and atraumatic.  Eyes: Conjunctivae and EOM are normal.  Neck: Normal range of motion.  Cardiovascular: Normal rate, regular rhythm and normal heart sounds.   Pulmonary/Chest: Effort normal and breath sounds normal.  Musculoskeletal: Normal range of motion.  Neurological: She is alert and oriented to person, place, and time.  Skin: No erythema.  Psychiatric: She has a normal mood and affect. Her behavior is normal. Judgment and thought content normal.    Results for orders placed or performed in visit on 04/15/17  Bayer DCA Hb A1c Waived  Result  Value Ref Range   Bayer DCA Hb A1c Waived 6.9 <7.0 %      Assessment & Plan:   Problem List Items Addressed This Visit      Cardiovascular and Mediastinum   Hypertension - Primary    The current medical regimen is effective;  continue present plan and medications.       Relevant Medications   benazepril (LOTENSIN) 40 MG tablet   carvedilol (COREG) 25 MG tablet   Other Relevant Orders   Basic metabolic panel   Bayer DCA Hb W0J Waived   LP+ALT+AST Piccolo, Waived     Endocrine   Diabetes mellitus without complication (HCC)    The current medical regimen is effective;  continue present plan and medications.       Relevant Medications   benazepril (LOTENSIN) 40 MG tablet   Other Relevant Orders   Basic metabolic panel   Bayer DCA Hb W1X Waived   LP+ALT+AST Piccolo, Waived     Other   Hyperlipidemia    The current medical regimen is effective;  continue present plan and medications.       Relevant Medications   benazepril (LOTENSIN) 40 MG tablet   carvedilol (COREG) 25 MG tablet   Other Relevant Orders   Basic metabolic panel   Bayer DCA Hb B1Y Waived   LP+ALT+AST Piccolo, [place]    Other Visit Diagnoses    Needs flu shot  Relevant Orders   Flu Vaccine QUAD 6+ mos PF IM (Fluarix Quad PF) (Completed)       Follow up plan: Return in about 3 months (around 10/15/2017) for Hemoglobin A1c.

## 2017-07-16 NOTE — Assessment & Plan Note (Signed)
The current medical regimen is effective;  continue present plan and medications.  

## 2017-07-17 ENCOUNTER — Encounter: Payer: Self-pay | Admitting: Family Medicine

## 2017-07-17 LAB — BASIC METABOLIC PANEL
BUN / CREAT RATIO: 20 (ref 12–28)
BUN: 17 mg/dL (ref 8–27)
CALCIUM: 9.4 mg/dL (ref 8.7–10.3)
CHLORIDE: 102 mmol/L (ref 96–106)
CO2: 25 mmol/L (ref 20–29)
Creatinine, Ser: 0.86 mg/dL (ref 0.57–1.00)
GFR, EST AFRICAN AMERICAN: 84 mL/min/{1.73_m2} (ref >59)
GFR, EST NON AFRICAN AMERICAN: 73 mL/min/{1.73_m2} (ref >59)
Glucose: 143 mg/dL — ABNORMAL HIGH (ref 65–99)
POTASSIUM: 4.2 mmol/L (ref 3.5–5.2)
Sodium: 143 mmol/L (ref 134–144)

## 2017-10-20 ENCOUNTER — Ambulatory Visit: Payer: BLUE CROSS/BLUE SHIELD | Admitting: Family Medicine

## 2017-10-28 ENCOUNTER — Encounter: Payer: Self-pay | Admitting: Family Medicine

## 2017-10-28 ENCOUNTER — Ambulatory Visit: Payer: BLUE CROSS/BLUE SHIELD | Admitting: Family Medicine

## 2017-10-28 VITALS — BP 140/80 | HR 78 | Wt 183.0 lb

## 2017-10-28 DIAGNOSIS — E785 Hyperlipidemia, unspecified: Secondary | ICD-10-CM | POA: Diagnosis not present

## 2017-10-28 DIAGNOSIS — I10 Essential (primary) hypertension: Secondary | ICD-10-CM | POA: Diagnosis not present

## 2017-10-28 DIAGNOSIS — E119 Type 2 diabetes mellitus without complications: Secondary | ICD-10-CM | POA: Diagnosis not present

## 2017-10-28 LAB — BAYER DCA HB A1C WAIVED: HB A1C: 6.7 % (ref <7.0)

## 2017-10-28 NOTE — Assessment & Plan Note (Signed)
The current medical regimen is effective;  continue present plan and medications.  

## 2017-10-28 NOTE — Progress Notes (Signed)
BP 140/80   Pulse 78   Wt 183 lb (83 kg)   SpO2 99%   BMI 34.42 kg/m    Subjective:    Patient ID: Amanda [name], female    DOB: 1956-04-21, 62 y.o.   MRN: [id number]  HPI: Amanda [name] is a 62 y.o. female  Chief Complaint  Patient presents with  . Diabetes   Patient follow-up diabetes doing well no complaints from medication. No low blood sugar spells. But pressure doing well with good control. Other medication for cholesterol etc. Doing well. Relevant past medical, surgical, family and social history reviewed and updated as indicated. Interim medical history since our last visit reviewed. Allergies and medications reviewed and updated.  Review of Systems  Constitutional: Negative.   Respiratory: Negative.   Cardiovascular: Negative.     Per HPI unless specifically indicated above     Objective:    BP 140/80   Pulse 78   Wt 183 lb (83 kg)   SpO2 99%   BMI 34.42 kg/m   Wt Readings from Last 3 Encounters:  10/28/17 183 lb (83 kg)  07/16/17 187 lb (84.8 kg)  04/15/17 183 lb (83 kg)    Physical Exam  Constitutional: She is oriented to person, place, and time. She appears well-developed and well-nourished.  HENT:  Head: Normocephalic and atraumatic.  Eyes: Conjunctivae and EOM are normal.  Neck: Normal range of motion.  Cardiovascular: Normal rate, regular rhythm and normal heart sounds.  Pulmonary/Chest: Effort normal and breath sounds normal.  Musculoskeletal: Normal range of motion.  Neurological: She is alert and oriented to person, place, and time.  Skin: No erythema.  Psychiatric: She has a normal mood and affect. Her behavior is normal. Judgment and thought content normal.    Results for orders placed or performed in visit on 07/16/17  Basic metabolic panel  Result Value Ref Range   Glucose 143 (H) 65 - 99 mg/dL   BUN 17 8 - 27 mg/dL   Creatinine, Ser 4.09 0.57 - 1.00 mg/dL   GFR calc non Af Amer 73 >59 mL/min/1.73   GFR calc Af Amer 84 >59 mL/min/1.73    BUN/Creatinine Ratio 20 12 - 28   Sodium 143 134 - 144 mmol/L   Potassium 4.2 3.5 - 5.2 mmol/L   Chloride 102 96 - 106 mmol/L   CO2 25 20 - 29 mmol/L   Calcium 9.4 8.7 - 10.3 mg/dL  Bayer DCA Hb W1X Waived  Result Value Ref Range   Bayer DCA Hb A1c Waived 6.3 <7.0 %  LP+ALT+AST Piccolo, Waived  Result Value Ref Range   ALT (SGPT) Piccolo, Waived 33 10 - 47 U/L   AST (SGOT) Piccolo, Waived 28 11 - 38 U/L   Cholesterol Piccolo, Waived 196 <200 mg/dL   HDL Chol Piccolo, Waived 57 (L) >59 mg/dL   Triglycerides Piccolo,Waived 301 (H) <150 mg/dL   Chol/HDL Ratio Piccolo,Waive 3.4 mg/dL   LDL Chol Calc Piccolo Waived 78 <100 mg/dL   VLDL Chol Calc Piccolo,Waive 60 (H) <30 mg/dL      Assessment & Plan:   Problem List Items Addressed This Visit      Cardiovascular and Mediastinum   Hypertension    The current medical regimen is effective;  continue present plan and medications.       Relevant Orders   Bayer DCA Hb A1c Waived     Endocrine   Diabetes mellitus without complication (HCC) - Primary    The current medical regimen  is effective;  continue present plan and medications.       Relevant Orders   Bayer DCA Hb A1c Waived     Other   Hyperlipidemia    The current medical regimen is effective;  continue present plan and medications.       Relevant Orders   Bayer DCA Hb A1c Waived       Follow up plan: Return in about 3 months (around 01/26/2018) for Physical Exam, Hemoglobin A1c.

## 2018-01-01 ENCOUNTER — Other Ambulatory Visit: Payer: Self-pay | Admitting: Family Medicine

## 2018-01-02 NOTE — Telephone Encounter (Signed)
HCTZ refill Last OV: 10/28/17 Last Refill:01/07/17 #30 12 RF Pharmacy:[facility] [address] PCP Vonita Moss MD Refilled for 6 months

## 2018-02-09 ENCOUNTER — Other Ambulatory Visit: Payer: Self-pay | Admitting: Family Medicine

## 2018-02-09 DIAGNOSIS — E119 Type 2 diabetes mellitus without complications: Secondary | ICD-10-CM

## 2018-02-09 DIAGNOSIS — E785 Hyperlipidemia, unspecified: Secondary | ICD-10-CM

## 2018-02-09 NOTE — Telephone Encounter (Signed)
Call pt\\apt 

## 2018-02-20 ENCOUNTER — Encounter: Payer: Self-pay | Admitting: Family Medicine

## 2018-03-02 ENCOUNTER — Encounter: Payer: Self-pay | Admitting: Family Medicine

## 2018-03-26 ENCOUNTER — Encounter: Payer: Self-pay | Admitting: Family Medicine

## 2018-03-26 ENCOUNTER — Ambulatory Visit (INDEPENDENT_AMBULATORY_CARE_PROVIDER_SITE_OTHER): Payer: BLUE CROSS/BLUE SHIELD | Admitting: Family Medicine

## 2018-03-26 VITALS — BP 137/85 | HR 69 | Wt 182.0 lb

## 2018-03-26 DIAGNOSIS — Z1231 Encounter for screening mammogram for malignant neoplasm of breast: Secondary | ICD-10-CM

## 2018-03-26 DIAGNOSIS — I10 Essential (primary) hypertension: Secondary | ICD-10-CM | POA: Diagnosis not present

## 2018-03-26 DIAGNOSIS — E119 Type 2 diabetes mellitus without complications: Secondary | ICD-10-CM | POA: Diagnosis not present

## 2018-03-26 DIAGNOSIS — E785 Hyperlipidemia, unspecified: Secondary | ICD-10-CM

## 2018-03-26 DIAGNOSIS — Z1239 Encounter for other screening for malignant neoplasm of breast: Secondary | ICD-10-CM

## 2018-03-26 LAB — BAYER DCA HB A1C WAIVED: HB A1C (BAYER DCA - WAIVED): 6.6 % (ref <7.0)

## 2018-03-26 MED ORDER — CARVEDILOL 25 MG PO TABS: 25.0000 mg | ORAL_TABLET | Freq: Two times a day (BID) | ORAL | 6 refills | Status: DC

## 2018-03-26 MED ORDER — DULAGLUTIDE 1.5 MG/0.5ML ~~LOC~~ SOAJ: SUBCUTANEOUS | 4 refills | Status: DC

## 2018-03-26 MED ORDER — SAXAGLIPTIN HCL 5 MG PO TABS: 5.0000 mg | ORAL_TABLET | Freq: Every day | ORAL | 4 refills | Status: DC

## 2018-03-26 MED ORDER — EMPAGLIFLOZIN-METFORMIN HCL ER 12.5-1000 MG PO TB24: 1.0000 | ORAL_TABLET | Freq: Two times a day (BID) | ORAL | 4 refills | Status: DC

## 2018-03-26 MED ORDER — LOVASTATIN 20 MG PO TABS: 40.0000 mg | ORAL_TABLET | Freq: Every day | ORAL | 4 refills | Status: DC

## 2018-03-26 MED ORDER — BENAZEPRIL HCL 40 MG PO TABS: 40.0000 mg | ORAL_TABLET | Freq: Every day | ORAL | 6 refills | Status: DC

## 2018-03-26 MED ORDER — MELOXICAM 15 MG PO TABS
15.0000 mg | ORAL_TABLET | Freq: Every day | ORAL | 4 refills | Status: DC
Start: 2018-03-26 — End: 2019-06-14

## 2018-03-26 MED ORDER — GLIMEPIRIDE 4 MG PO TABS: ORAL_TABLET | ORAL | 4 refills | Status: DC

## 2018-03-26 NOTE — Assessment & Plan Note (Signed)
The current medical regimen is effective;  continue present plan and medications.  

## 2018-03-26 NOTE — Progress Notes (Signed)
BP 137/85   Pulse 69   Wt 182 lb (82.6 kg)   SpO2 98%   BMI 34.23 kg/m    Subjective:    Patient ID: Amanda [name], female    DOB: [birth year], 62 y.o.   MRN: [id number]  HPI: Amanda [name] is a 62 y.o. female  Chief Complaint  Patient presents with  . Follow-up  Patient follow-up doing well with diabetes no low blood sugar spells or issues. Takes lovastatin without problems also blood pressure medicines without problems takes medications faithfully.  Relevant past medical, surgical, family and social history reviewed and updated as indicated. Interim medical history since our last visit reviewed. Allergies and medications reviewed and updated.  Review of Systems  Constitutional: Negative.   Respiratory: Negative.   Cardiovascular: Negative.     Per HPI unless specifically indicated above     Objective:    BP 137/85   Pulse 69   Wt 182 lb (82.6 kg)   SpO2 98%   BMI 34.23 kg/m   Wt Readings from Last 3 Encounters:  03/26/18 182 lb (82.6 kg)  10/28/17 183 lb (83 kg)  07/16/17 187 lb (84.8 kg)    Physical Exam  Constitutional: She is oriented to person, place, and time. She appears well-developed and well-nourished.  HENT:  Head: Normocephalic and atraumatic.  Eyes: Conjunctivae and EOM are normal.  Neck: Normal range of motion.  Cardiovascular: Normal rate, regular rhythm and normal heart sounds.  Pulmonary/Chest: Effort normal and breath sounds normal.  Musculoskeletal: Normal range of motion.  Neurological: She is alert and oriented to person, place, and time.  Skin: No erythema.  Psychiatric: She has a normal mood and affect. Her behavior is normal. Judgment and thought content normal.    Results for orders placed or performed in visit on 03/26/18  Bayer DCA Hb A1c Waived  Result Value Ref Range   HB A1C (BAYER DCA - WAIVED) 6.6 <7.0 %      Assessment & Plan:   Problem List Items Addressed This Visit      Cardiovascular and Mediastinum   Hypertension -  Primary    The current medical regimen is effective;  continue present plan and medications.       Relevant Medications   lovastatin (MEVACOR) 20 MG tablet   carvedilol (COREG) 25 MG tablet   benazepril (LOTENSIN) 40 MG tablet   Other Relevant Orders   Bayer DCA Hb A1c Waived (Completed)     Endocrine   Diabetes mellitus without complication (HCC)    The current medical regimen is effective;  continue present plan and medications.       Relevant Medications   Dulaglutide (TRULICITY) 1.5 MG/0.5ML SOPN   Empagliflozin-metFORMIN HCl ER (SYNJARDY XR) 12.02-999 MG TB24   saxagliptin HCl (ONGLYZA) 5 MG TABS tablet   lovastatin (MEVACOR) 20 MG tablet   glimepiride (AMARYL) 4 MG tablet   benazepril (LOTENSIN) 40 MG tablet   Other Relevant Orders   Bayer DCA Hb A1c Waived (Completed)     Other   Hyperlipidemia    The current medical regimen is effective;  continue present plan and medications.       Relevant Medications   lovastatin (MEVACOR) 20 MG tablet   carvedilol (COREG) 25 MG tablet   benazepril (LOTENSIN) 40 MG tablet    Other Visit Diagnoses    Breast cancer screening       Relevant Orders   MM DIGITAL SCREENING BILATERAL  Follow up plan: Return in about 3 months (around 06/26/2018) for Physical Exam, Hemoglobin A1c.

## 2018-07-22 ENCOUNTER — Ambulatory Visit (INDEPENDENT_AMBULATORY_CARE_PROVIDER_SITE_OTHER): Payer: BLUE CROSS/BLUE SHIELD | Admitting: Family Medicine

## 2018-07-22 ENCOUNTER — Encounter: Payer: Self-pay | Admitting: Family Medicine

## 2018-07-22 ENCOUNTER — Other Ambulatory Visit: Payer: Self-pay

## 2018-07-22 VITALS — BP 109/59 | HR 70 | Temp 98.8°F | Ht 60.0 in | Wt 185.0 lb

## 2018-07-22 DIAGNOSIS — E119 Type 2 diabetes mellitus without complications: Secondary | ICD-10-CM

## 2018-07-22 DIAGNOSIS — E785 Hyperlipidemia, unspecified: Secondary | ICD-10-CM

## 2018-07-22 DIAGNOSIS — I10 Essential (primary) hypertension: Secondary | ICD-10-CM

## 2018-07-22 DIAGNOSIS — Z23 Encounter for immunization: Secondary | ICD-10-CM

## 2018-07-22 DIAGNOSIS — Z0000 Encounter for general adult medical examination without abnormal findings: Secondary | ICD-10-CM | POA: Diagnosis not present

## 2018-07-22 LAB — URINALYSIS, ROUTINE W REFLEX MICROSCOPIC
Bilirubin, UA: NEGATIVE
NITRITE UA: NEGATIVE
PH UA: 5 (ref 5.0–7.5)
Protein, UA: NEGATIVE
RBC UA: NEGATIVE
Specific Gravity, UA: 1.02 (ref 1.005–1.030)
UUROB: 1 mg/dL (ref 0.2–1.0)

## 2018-07-22 LAB — MICROSCOPIC EXAMINATION: WBC, UA: >30 /hpf — AB (ref 0–5)

## 2018-07-22 LAB — BAYER DCA HB A1C WAIVED: HB A1C (BAYER DCA - WAIVED): 6.3 % (ref <7.0)

## 2018-07-22 MED ORDER — SAXAGLIPTIN HCL 5 MG PO TABS: 5.0000 mg | ORAL_TABLET | Freq: Every day | ORAL | 12 refills | Status: DC

## 2018-07-22 MED ORDER — DULAGLUTIDE 1.5 MG/0.5ML ~~LOC~~ SOAJ: SUBCUTANEOUS | 12 refills | Status: DC

## 2018-07-22 MED ORDER — BENAZEPRIL HCL 40 MG PO TABS: 40.0000 mg | ORAL_TABLET | Freq: Every day | ORAL | 12 refills | Status: DC

## 2018-07-22 MED ORDER — HYDROCHLOROTHIAZIDE 25 MG PO TABS: 25.0000 mg | ORAL_TABLET | Freq: Every day | ORAL | 12 refills | Status: DC

## 2018-07-22 MED ORDER — EMPAGLIFLOZIN-METFORMIN HCL ER 12.5-1000 MG PO TB24: 1.0000 | ORAL_TABLET | Freq: Two times a day (BID) | ORAL | 12 refills | Status: DC

## 2018-07-22 MED ORDER — TRAZODONE HCL 50 MG PO TABS: 50.0000 mg | ORAL_TABLET | Freq: Every day | ORAL | 12 refills | Status: DC

## 2018-07-22 MED ORDER — GLIMEPIRIDE 4 MG PO TABS: ORAL_TABLET | ORAL | 12 refills | Status: DC

## 2018-07-22 MED ORDER — LOVASTATIN 20 MG PO TABS: 40.0000 mg | ORAL_TABLET | Freq: Every day | ORAL | 12 refills | Status: DC

## 2018-07-22 MED ORDER — CARVEDILOL 25 MG PO TABS: 25.0000 mg | ORAL_TABLET | Freq: Two times a day (BID) | ORAL | 12 refills | Status: DC

## 2018-07-22 NOTE — Assessment & Plan Note (Signed)
The current medical regimen is effective;  continue present plan and medications.  

## 2018-07-22 NOTE — Progress Notes (Signed)
BP (!) 109/59   Pulse 70   Temp 98.8 F (37.1 C) (Oral)   Ht 5' (1.524 m)   Wt 185 lb (83.9 kg)   SpO2 97%   BMI 36.13 kg/m    Subjective:    Patient ID: Amanda [name], female    DOB: February 14, 1956, 62 y.o.   MRN: [id number]  HPI: Amanda [name] is a 62 y.o. female  Chief Complaint  Patient presents with  . Annual Exam  . Diabetes  . Hypertension  Patient follow-up all in all doing well no complaints taking blood pressure with good control no issues. Diabetes doing well with medications no low blood sugar spells. Cholesterol doing well also with lovastatin. Taking trazodone occasionally for sleep and that is doing well also.  Relevant past medical, surgical, family and social history reviewed and updated as indicated. Interim medical history since our last visit reviewed. Allergies and medications reviewed and updated.  Review of Systems  Constitutional: Negative.   HENT: Negative.   Eyes: Negative.   Respiratory: Negative.   Cardiovascular: Negative.   Gastrointestinal: Negative.   Endocrine: Negative.   Genitourinary: Negative.   Musculoskeletal: Negative.        Patient also with some mid back pain from bending stooping does not do any lifting.  Also right arch discomfort ongoing for the last week or so no known specific trauma or bruising has been identified.  Skin: Negative.   Allergic/Immunologic: Negative.   Neurological: Negative.   Hematological: Negative.   Psychiatric/Behavioral: Negative.     Per HPI unless specifically indicated above     Objective:    BP (!) 109/59   Pulse 70   Temp 98.8 F (37.1 C) (Oral)   Ht 5' (1.524 m)   Wt 185 lb (83.9 kg)   SpO2 97%   BMI 36.13 kg/m   Wt Readings from Last 3 Encounters:  07/22/18 185 lb (83.9 kg)  03/26/18 182 lb (82.6 kg)  10/28/17 183 lb (83 kg)    Physical Exam  Constitutional: She is oriented to person, place, and time. She appears well-developed and well-nourished.  HENT:  Head: Normocephalic and  atraumatic.  Right Ear: External ear normal.  Left Ear: External ear normal.  Nose: Nose normal.  Mouth/Throat: Oropharynx is clear and moist.  Eyes: Pupils are equal, round, and reactive to light. Conjunctivae and EOM are normal.  Neck: Normal range of motion. Neck supple. Carotid bruit is not present.  Cardiovascular: Normal rate, regular rhythm and normal heart sounds.  No murmur heard. Pulmonary/Chest: Effort normal and breath sounds normal. She exhibits no mass. Right breast exhibits no mass, no skin change and no tenderness. Left breast exhibits no mass, no skin change and no tenderness. Breasts are symmetrical.  Abdominal: Soft. Bowel sounds are normal. There is no hepatosplenomegaly.  Musculoskeletal: Normal range of motion.  Neurological: She is alert and oriented to person, place, and time.  Skin: No rash noted.  Psychiatric: She has a normal mood and affect. Her behavior is normal. Judgment and thought content normal.    Results for orders placed or performed in visit on 03/26/18  Bayer DCA Hb A1c Waived  Result Value Ref Range   HB A1C (BAYER DCA - WAIVED) 6.6 <7.0 %      Assessment & Plan:   Problem List Items Addressed This Visit      Cardiovascular and Mediastinum   Hypertension    The current medical regimen is effective;  continue present plan and medications.  Relevant Medications   hydrochlorothiazide (HYDRODIURIL) 25 MG tablet   carvedilol (COREG) 25 MG tablet   benazepril (LOTENSIN) 40 MG tablet   lovastatin (MEVACOR) 20 MG tablet     Endocrine   Diabetes mellitus without complication (HCC)    The current medical regimen is effective;  continue present plan and medications.       Relevant Medications   glimepiride (AMARYL) 4 MG tablet   Dulaglutide (TRULICITY) 1.5 MG/0.5ML SOPN   Empagliflozin-metFORMIN HCl ER (SYNJARDY XR) 12.02-999 MG TB24   benazepril (LOTENSIN) 40 MG tablet   lovastatin (MEVACOR) 20 MG tablet   saxagliptin HCl (ONGLYZA)  5 MG TABS tablet   Other Relevant Orders   Bayer DCA Hb A1c Waived   Urine Culture     Other   Hyperlipidemia    The current medical regimen is effective;  continue present plan and medications.       Relevant Medications   hydrochlorothiazide (HYDRODIURIL) 25 MG tablet   carvedilol (COREG) 25 MG tablet   benazepril (LOTENSIN) 40 MG tablet   lovastatin (MEVACOR) 20 MG tablet    Other Visit Diagnoses    Flu vaccine need    -  Primary   Relevant Orders   Flu Vaccine QUAD 36+ mos IM (Completed)   Routine general medical examination at a health care facility       Relevant Orders   CBC with Differential/Platelet   TSH   Lipid panel   Urinalysis, Routine w reflex microscopic   Comprehensive metabolic panel      And urinalysis comes back showing question of infection patient with no symptoms we will check urine culture and sensitivity. Follow up plan: Return in about 6 months (around 01/21/2019) for Hemoglobin A1c, BMP,  Lipids, ALT, AST.

## 2018-07-23 LAB — CBC WITH DIFFERENTIAL/PLATELET
Basophils Absolute: 0 10*3/uL (ref 0.0–0.2)
Basos: 1 %
EOS (ABSOLUTE): 0.4 10*3/uL (ref 0.0–0.4)
Eos: 6 %
Hematocrit: 32.6 % — ABNORMAL LOW (ref 34.0–46.6)
Hemoglobin: 11.1 g/dL (ref 11.1–15.9)
IMMATURE GRANULOCYTES: 0 %
Immature Grans (Abs): 0 10*3/uL (ref 0.0–0.1)
Lymphocytes Absolute: 1.6 10*3/uL (ref 0.7–3.1)
Lymphs: 26 %
MCH: 29.6 pg (ref 26.6–33.0)
MCHC: 34 g/dL (ref 31.5–35.7)
MCV: 87 fL (ref 79–97)
MONOS ABS: 0.4 10*3/uL (ref 0.1–0.9)
Monocytes: 7 %
NEUTROS PCT: 60 %
Neutrophils Absolute: 3.6 10*3/uL (ref 1.4–7.0)
PLATELETS: 210 10*3/uL (ref 150–450)
RBC: 3.75 x10E6/uL — ABNORMAL LOW (ref 3.77–5.28)
RDW: 13 % (ref 12.3–15.4)
WBC: 6 10*3/uL (ref 3.4–10.8)

## 2018-07-23 LAB — TSH: TSH: 1.73 u[IU]/mL (ref 0.450–4.500)

## 2018-07-23 LAB — COMPREHENSIVE METABOLIC PANEL
ALBUMIN: 4.1 g/dL (ref 3.6–4.8)
ALT: 21 IU/L (ref 0–32)
AST: 21 IU/L (ref 0–40)
Albumin/Globulin Ratio: 1.6 (ref 1.2–2.2)
Alkaline Phosphatase: 79 IU/L (ref 39–117)
BILIRUBIN TOTAL: 0.3 mg/dL (ref 0.0–1.2)
BUN / CREAT RATIO: 25 (ref 12–28)
BUN: 29 mg/dL — ABNORMAL HIGH (ref 8–27)
CHLORIDE: 102 mmol/L (ref 96–106)
CO2: 24 mmol/L (ref 20–29)
Calcium: 9.7 mg/dL (ref 8.7–10.3)
Creatinine, Ser: 1.18 mg/dL — ABNORMAL HIGH (ref 0.57–1.00)
GFR calc Af Amer: 57 mL/min/{1.73_m2} — ABNORMAL LOW (ref >59)
GFR calc non Af Amer: 50 mL/min/{1.73_m2} — ABNORMAL LOW (ref >59)
GLOBULIN, TOTAL: 2.5 g/dL (ref 1.5–4.5)
GLUCOSE: 75 mg/dL (ref 65–99)
POTASSIUM: 4 mmol/L (ref 3.5–5.2)
SODIUM: 141 mmol/L (ref 134–144)
Total Protein: 6.6 g/dL (ref 6.0–8.5)

## 2018-07-23 LAB — LIPID PANEL
CHOLESTEROL TOTAL: 137 mg/dL (ref 100–199)
Chol/HDL Ratio: 2.9 ratio (ref 0.0–4.4)
HDL: 47 mg/dL (ref >39)
LDL CALC: 49 mg/dL (ref 0–99)
Triglycerides: 207 mg/dL — ABNORMAL HIGH (ref 0–149)
VLDL Cholesterol Cal: 41 mg/dL — ABNORMAL HIGH (ref 5–40)

## 2018-07-24 ENCOUNTER — Other Ambulatory Visit: Payer: Self-pay | Admitting: Family Medicine

## 2018-07-24 LAB — URINE CULTURE

## 2018-07-24 MED ORDER — SULFAMETHOXAZOLE-TRIMETHOPRIM 800-160 MG PO TABS: 1.0000 | ORAL_TABLET | Freq: Two times a day (BID) | ORAL | 0 refills | Status: DC

## 2018-09-15 ENCOUNTER — Encounter: Payer: Self-pay | Admitting: Nurse Practitioner

## 2018-09-15 ENCOUNTER — Ambulatory Visit: Payer: BLUE CROSS/BLUE SHIELD | Admitting: Nurse Practitioner

## 2018-09-15 VITALS — BP 102/62 | HR 62 | Temp 97.7°F | Ht 60.0 in | Wt 187.1 lb

## 2018-09-15 DIAGNOSIS — J40 Bronchitis, not specified as acute or chronic: Secondary | ICD-10-CM

## 2018-09-15 DIAGNOSIS — R05 Cough: Secondary | ICD-10-CM

## 2018-09-15 DIAGNOSIS — R059 Cough, unspecified: Secondary | ICD-10-CM

## 2018-09-15 LAB — VERITOR FLU A/B WAIVED
INFLUENZA B: NEGATIVE
Influenza A: NEGATIVE

## 2018-09-15 MED ORDER — AZITHROMYCIN 250 MG PO TABS: ORAL_TABLET | ORAL | 0 refills | Status: DC

## 2018-09-15 MED ORDER — BENZONATATE 200 MG PO CAPS: 200.0000 mg | ORAL_CAPSULE | Freq: Two times a day (BID) | ORAL | 0 refills | Status: DC | PRN

## 2018-09-15 NOTE — Progress Notes (Signed)
BP 102/62 (BP Location: Left Arm, Patient Position: Sitting, Cuff Size: Large)   Pulse 62   Temp 97.7 F (36.5 C)   Ht 5' (1.524 m)   Wt 187 lb 2 oz (84.9 kg)   SpO2 100%   BMI 36.55 kg/m    Subjective:    Patient ID: Amanda [name], female    DOB: 02/13/1956, 62 y.o.   MRN: [id number]  HPI: Amanda [name] is a 62 y.o. female presents for URI  Chief Complaint  Patient presents with  . Cough    congestion right ear and face   UPPER RESPIRATORY TRACT INFECTION Has been sick for one month, started with tickle in throat, and then cough started with congestion and productive cough.  Has taken Z-pack and Tessalon in past for upper respiratory infections, no issues with these medications. Worst symptom: Sinus issues Fever: no Cough: yes Shortness of breath: no Wheezing: yes Chest pain: no Chest tightness: no Chest congestion: yes Nasal congestion: yes, has been using Flonase but not helping Runny nose: no Post nasal drip: no Sneezing: no Sore throat: no Swollen glands: no Sinus pressure: yes Headache: no Face pain: no Toothache: no Ear pain: no none noted Ear pressure: yes "right Eyes red/itching:no Eye drainage/crusting: no  Vomiting: no Rash: no Fatigue: yes Sick contacts: yes Strep contacts: no  Context: fluctuating Recurrent sinusitis: no Relief with OTC cold/cough medications: no  Treatments attempted: Robitussin DM, diabetic cough syrup, Mucinex  Relevant past medical, surgical, family and social history reviewed and updated as indicated. Interim medical history since our last visit reviewed. Allergies and medications reviewed and updated.  Review of Systems  Constitutional: Negative for activity change, appetite change and fatigue.  HENT: Positive for congestion, postnasal drip, rhinorrhea, sinus pressure and tinnitus. Negative for ear discharge, ear pain, hearing loss, sneezing, sore throat and voice change.   Eyes: Negative for pain, itching and visual  disturbance.  Respiratory: Positive for cough. Negative for chest tightness, shortness of breath and wheezing.   Cardiovascular: Negative for chest pain, palpitations and leg swelling.  Gastrointestinal: Negative for abdominal distention, abdominal pain, constipation, diarrhea, nausea and vomiting.  Endocrine: Negative.   Musculoskeletal: Positive for myalgias.  Neurological: Negative for dizziness, numbness and headaches.  Psychiatric/Behavioral: Negative.     Per HPI unless specifically indicated above     Objective:    BP 102/62 (BP Location: Left Arm, Patient Position: Sitting, Cuff Size: Large)   Pulse 62   Temp 97.7 F (36.5 C)   Ht 5' (1.524 m)   Wt 187 lb 2 oz (84.9 kg)   SpO2 100%   BMI 36.55 kg/m   Wt Readings from Last 3 Encounters:  09/15/18 187 lb 2 oz (84.9 kg)  07/22/18 185 lb (83.9 kg)  03/26/18 182 lb (82.6 kg)    Physical Exam  Constitutional: She is oriented to person, place, and time. She appears well-developed and well-nourished.  HENT:  Head: Normocephalic. Head is without raccoon's eyes.  Right Ear: Hearing and external ear normal. Tympanic membrane is not injected. A middle ear effusion is present.  Left Ear: Hearing, tympanic membrane, external ear and ear canal normal.  Nose: Mucosal edema and rhinorrhea present. Right sinus exhibits frontal sinus tenderness. Right sinus exhibits no maxillary sinus tenderness. Left sinus exhibits frontal sinus tenderness. Left sinus exhibits no maxillary sinus tenderness.  Mouth/Throat: Uvula is midline, oropharynx is clear and moist and mucous membranes are normal. No oropharyngeal exudate, posterior oropharyngeal edema or posterior oropharyngeal  erythema.  Eyes: Pupils are equal, round, and reactive to light. Conjunctivae and EOM are normal. Right eye exhibits no discharge. Left eye exhibits no discharge.  Neck: Normal range of motion. Neck supple. No JVD present. Carotid bruit is not present. No thyromegaly present.    Cardiovascular: Normal rate, regular rhythm and normal heart sounds.  Pulmonary/Chest: Effort normal and breath sounds normal.  Abdominal: Soft. Bowel sounds are normal.  Lymphadenopathy:    She has no cervical adenopathy.  Neurological: She is alert and oriented to person, place, and time.  Skin: Skin is warm and dry.  Psychiatric: She has a normal mood and affect. Her behavior is normal. Judgment and thought content normal.  Nursing note and vitals reviewed.   Results for orders placed or performed in visit on 07/22/18  Urine Culture  Result Value Ref Range   Urine Culture, Routine Final report (A)    Organism ID, Bacteria Escherichia coli (A)    Antimicrobial Susceptibility Comment   Microscopic Examination  Result Value Ref Range   WBC, UA >30 (A) 0 - 5 /hpf   RBC, UA 0-2 0 - 2 /hpf   Epithelial Cells (non renal) 0-10 0 - 10 /hpf   Bacteria, UA Few None seen/Few  CBC with Differential/Platelet  Result Value Ref Range   WBC 6.0 3.4 - 10.8 x10E3/uL   RBC 3.75 (L) 3.77 - 5.28 x10E6/uL   Hemoglobin 11.1 11.1 - 15.9 g/dL   Hematocrit 37.3 (L) 42.8 - 46.6 %   MCV 87 79 - 97 fL   MCH 29.6 26.6 - 33.0 pg   MCHC 34.0 31.5 - 35.7 g/dL   RDW 76.8 11.5 - 72.6 %   Platelets 210 150 - 450 x10E3/uL   Neutrophils 60 Not Estab. %   Lymphs 26 Not Estab. %   Monocytes 7 Not Estab. %   Eos 6 Not Estab. %   Basos 1 Not Estab. %   Neutrophils Absolute 3.6 1.4 - 7.0 x10E3/uL   Lymphocytes Absolute 1.6 0.7 - 3.1 x10E3/uL   Monocytes Absolute 0.4 0.1 - 0.9 x10E3/uL   EOS (ABSOLUTE) 0.4 0.0 - 0.4 x10E3/uL   Basophils Absolute 0.0 0.0 - 0.2 x10E3/uL   Immature Granulocytes 0 Not Estab. %   Immature Grans (Abs) 0.0 0.0 - 0.1 x10E3/uL  Bayer DCA Hb A1c Waived  Result Value Ref Range   HB A1C (BAYER DCA - WAIVED) 6.3 <7.0 %  TSH  Result Value Ref Range   TSH 1.730 0.450 - 4.500 uIU/mL  Lipid panel  Result Value Ref Range   Cholesterol, Total 137 100 - 199 mg/dL   Triglycerides 203 (H)  0 - 149 mg/dL   HDL 47 >55 mg/dL   VLDL Cholesterol Cal 41 (H) 5 - 40 mg/dL   LDL Calculated 49 0 - 99 mg/dL   Chol/HDL Ratio 2.9 0.0 - 4.4 ratio  Urinalysis, Routine w reflex microscopic  Result Value Ref Range   Specific Gravity, UA 1.020 1.005 - 1.030   pH, UA 5.0 5.0 - 7.5   Color, UA Yellow Yellow   Appearance Ur Turbid (A) Clear   Leukocytes, UA 1+ (A) Negative   Protein, UA Negative Negative/Trace   Glucose, UA 3+ (A) Negative   Ketones, UA Trace (A) Negative   RBC, UA Negative Negative   Bilirubin, UA Negative Negative   Urobilinogen, Ur 1.0 0.2 - 1.0 mg/dL   Nitrite, UA Negative Negative   Microscopic Examination See below:   Comprehensive metabolic panel  Result Value Ref Range   Glucose 75 65 - 99 mg/dL   BUN 29 (H) 8 - 27 mg/dL   Creatinine, Ser 1.61 (H) 0.57 - 1.00 mg/dL   GFR calc non Af Amer 50 (L) >59 mL/min/1.73   GFR calc Af Amer 57 (L) >59 mL/min/1.73   BUN/Creatinine Ratio 25 12 - 28   Sodium 141 134 - 144 mmol/L   Potassium 4.0 3.5 - 5.2 mmol/L   Chloride 102 96 - 106 mmol/L   CO2 24 20 - 29 mmol/L   Calcium 9.7 8.7 - 10.3 mg/dL   Total Protein 6.6 6.0 - 8.5 g/dL   Albumin 4.1 3.6 - 4.8 g/dL   Globulin, Total 2.5 1.5 - 4.5 g/dL   Albumin/Globulin Ratio 1.6 1.2 - 2.2   Bilirubin Total 0.3 0.0 - 1.2 mg/dL   Alkaline Phosphatase 79 39 - 117 IU/L   AST 21 0 - 40 IU/L   ALT 21 0 - 32 IU/L      Assessment & Plan:   Problem List Items Addressed This Visit      Respiratory   Bronchitis - Primary    Acute.  Flu negative. Script sent for Z-pack and Tessalon.  Recommended use of Claritin 10 MG once a day while sick to help with drainage.  Return if continued or worsening symptoms.       Other Visit Diagnoses    Cough       Relevant Orders   Veritor Flu A/B Waived       Follow up plan: Return if symptoms worsen or fail to improve.

## 2018-09-15 NOTE — Assessment & Plan Note (Addendum)
Acute.  Flu negative. Script sent for Z-pack and Tessalon.  Recommended use of Claritin 10 MG once a day while sick to help with drainage.  Return if continued or worsening symptoms.

## 2018-09-15 NOTE — Patient Instructions (Signed)
Claritin 10 MG by mouth once a day while sick, this will help with drainage.  Acute Bronchitis, Adult Acute bronchitis is when air tubes (bronchi) in the lungs suddenly get swollen. The condition can make it hard to breathe. It can also cause these symptoms:  A cough.  Coughing up clear, yellow, or green mucus.  Wheezing.  Chest congestion.  Shortness of breath.  A fever.  Body aches.  Chills.  A sore throat.  Follow these instructions at home: Medicines  Take over-the-counter and prescription medicines only as told by your doctor.  If you were prescribed an antibiotic medicine, take it as told by your doctor. Do not stop taking the antibiotic even if you start to feel better. General instructions  Rest.  Drink enough fluids to keep your pee (urine) clear or pale yellow.  Avoid smoking and secondhand smoke. If you smoke and you need help quitting, ask your doctor. Quitting will help your lungs heal faster.  Use an inhaler, cool mist vaporizer, or humidifier as told by your doctor.  Keep all follow-up visits as told by your doctor. This is important. How is this prevented? To lower your risk of getting this condition again:  Wash your hands often with soap and water. If you cannot use soap and water, use hand sanitizer.  Avoid contact with people who have cold symptoms.  Try not to touch your hands to your mouth, nose, or eyes.  Make sure to get the flu shot every year.  Contact a doctor if:  Your symptoms do not get better in 2 weeks. Get help right away if:  You cough up blood.  You have chest pain.  You have very bad shortness of breath.  You become dehydrated.  You faint (pass out) or keep feeling like you are going to pass out.  You keep throwing up (vomiting).  You have a very bad headache.  Your fever or chills gets worse. This information is not intended to replace advice given to you by your health care provider. Make sure you discuss any  questions you have with your health care provider. Document Released: 03/25/2008 Document Revised: 05/15/2016 Document Reviewed: 03/27/2016 Elsevier Interactive Patient Education  [employer].

## 2018-09-29 ENCOUNTER — Telehealth: Payer: Self-pay | Admitting: Nurse Practitioner

## 2018-09-29 NOTE — Telephone Encounter (Signed)
Copied from CRM [id number]. Topic: General - Other >> Sep 29, 2018  9:55 AM Gerrianne Scale wrote: Reason for CRM: pt calling stating that Jolene advised her to call back if she wasn't feeling any better she now have head congestion and cough lt ear is swollen now instead of the rt ear she has finished her medicines that was given to her >> Sep 29, 2018  9:58 AM Gerrianne Scale wrote: Pt is at work she would like to get a call at her job [phone] ask for her

## 2018-09-30 ENCOUNTER — Encounter: Payer: Self-pay | Admitting: Family Medicine

## 2018-09-30 ENCOUNTER — Encounter: Payer: Self-pay | Admitting: Nurse Practitioner

## 2018-09-30 ENCOUNTER — Ambulatory Visit: Payer: BLUE CROSS/BLUE SHIELD | Admitting: Nurse Practitioner

## 2018-09-30 ENCOUNTER — Other Ambulatory Visit: Payer: Self-pay

## 2018-09-30 DIAGNOSIS — J0110 Acute frontal sinusitis, unspecified: Secondary | ICD-10-CM

## 2018-09-30 MED ORDER — DOXYCYCLINE HYCLATE 100 MG PO TABS: 100.0000 mg | ORAL_TABLET | Freq: Two times a day (BID) | ORAL | 0 refills | Status: AC

## 2018-09-30 NOTE — Assessment & Plan Note (Signed)
Worsening sinus symptoms.  Recent cough improved with Zpack.  Doxycycline 100 MG BID x 7 days script sent.  Encouraged continued use of Flonase, Claritin, and sinus rinses.  Humidifier in room.  Take probiotic tablet or eat probiotic yogurt while taking abx therapy.  Return for worsening or continued symptoms.

## 2018-09-30 NOTE — Progress Notes (Addendum)
BP 108/62   Pulse 71   Temp 98 F (36.7 C) (Oral)   Ht 5\' 1"  (1.549 m)   Wt 185 lb (83.9 kg)   SpO2 99%   BMI 34.96 kg/m    Subjective:    Patient ID: Amanda [name], female    DOB: 02-13-1956, 62 y.o.   MRN: [id number]  HPI: Amanda [name] is a 62 y.o. female presents for ongoing URI symptoms  Chief Complaint  Patient presents with  . Cough    f/u pt states that has taken all the medications but still not filling good  . Ears discomfort   UPPER RESPIRATORY TRACT INFECTION Recently treated with Zpack for bronchitis, reports cough has improved and now has sinus pain and ear pain, which is worsening. Worst symptom: congestion and ears stuffiness Fever: no Cough: yes , this is improving Shortness of breath: no Wheezing: no Chest pain: no Chest tightness: no Chest congestion: no Nasal congestion: yes Runny nose: yes Post nasal drip: yes Sneezing: yes Sore throat: no Swollen glands: no Sinus pressure: yes Headache: no Face pain: no Toothache: no Ear pain: yes left Ear pressure: yes bilateral Eyes red/itching:no Eye drainage/crusting: no  Vomiting: no Rash: no Fatigue: yes Sick contacts: yes Strep contacts: no  Context: fluctuating Recurrent sinusitis: no Relief with OTC cold/cough medications: no  Treatments attempted: cold/sinus   Relevant past medical, surgical, family and social history reviewed and updated as indicated. Interim medical history since our last visit reviewed. Allergies and medications reviewed and updated.  Review of Systems  Constitutional: Positive for fatigue. Negative for activity change, appetite change, diaphoresis and fever.  HENT: Positive for congestion, ear pain, facial swelling, postnasal drip, rhinorrhea, sinus pressure, sinus pain and sneezing. Negative for ear discharge, sore throat, tinnitus and voice change.   Eyes: Negative for pain, itching and visual disturbance.  Respiratory: Negative for cough, chest tightness and  shortness of breath.   Cardiovascular: Negative for chest pain, palpitations and leg swelling.  Gastrointestinal: Negative for abdominal distention, abdominal pain, constipation, diarrhea, nausea and vomiting.  Neurological: Positive for headaches. Negative for dizziness, syncope, weakness and numbness.  Psychiatric/Behavioral: Negative.     Per HPI unless specifically indicated above     Objective:    BP 108/62   Pulse 71   Temp 98 F (36.7 C) (Oral)   Ht 5\' 1"  (1.549 m)   Wt 185 lb (83.9 kg)   SpO2 99%   BMI 34.96 kg/m   Wt Readings from Last 3 Encounters:  09/30/18 185 lb (83.9 kg)  09/15/18 187 lb 2 oz (84.9 kg)  07/22/18 185 lb (83.9 kg)    Physical Exam  Constitutional: She is oriented to person, place, and time. She appears well-developed and well-nourished.  HENT:  Head: Normocephalic. Head is without raccoon's eyes.  Right Ear: Hearing, external ear and ear canal normal. A middle ear effusion is present.  Left Ear: Hearing, external ear and ear canal normal. A middle ear effusion is present.  Nose: Mucosal edema and rhinorrhea present. Right sinus exhibits frontal sinus tenderness. Right sinus exhibits no maxillary sinus tenderness. Left sinus exhibits frontal sinus tenderness. Left sinus exhibits no maxillary sinus tenderness.  Mouth/Throat: Uvula is midline, oropharynx is clear and moist and mucous membranes are normal. No oropharyngeal exudate, posterior oropharyngeal edema or posterior oropharyngeal erythema.  Mild facial swelling under eyes.  Eyes: Pupils are equal, round, and reactive to light. Conjunctivae and EOM are normal. Right eye exhibits no discharge. Left  eye exhibits no discharge.  Neck: Normal range of motion. Neck supple. No JVD present. Carotid bruit is not present. No thyromegaly present.  Cardiovascular: Normal rate, regular rhythm and normal heart sounds.  Pulmonary/Chest: Effort normal and breath sounds normal.  Clear throughout, with  intermittent dry cough noted.  Abdominal: Soft. Bowel sounds are normal.  Lymphadenopathy:       Head (right side): Submandibular adenopathy present. No submental and no tonsillar adenopathy present.       Head (left side): Submandibular adenopathy present. No submental and no tonsillar adenopathy present.    She has no cervical adenopathy.  Neurological: She is alert and oriented to person, place, and time.  Skin: Skin is warm and dry.  Psychiatric: She has a normal mood and affect. Her behavior is normal. Judgment and thought content normal.  Nursing note and vitals reviewed.   Results for orders placed or performed in visit on 09/15/18  Veritor Flu A/B Waived  Result Value Ref Range   Influenza A Negative Negative   Influenza B Negative Negative      Assessment & Plan:   Problem List Items Addressed This Visit      Respiratory   Sinusitis, acute frontal    Worsening sinus symptoms.  Recent cough improved with Zpack.  Doxycycline 100 MG BID x 7 days script sent.  Encouraged continued use of Flonase, Claritin, and sinus rinses.  Humidifier in room.  Take probiotic tablet or eat probiotic yogurt while taking abx therapy.  Return for worsening or continued symptoms.      Relevant Medications   doxycycline (VIBRA-TABS) 100 MG tablet       Follow up plan: Return if symptoms worsen or fail to improve.

## 2018-09-30 NOTE — Patient Instructions (Signed)
Sinus Rinse What is a sinus rinse? A sinus rinse is a home treatment. It rinses your sinuses with a mixture of salt and water (saline solution). Sinuses are air-filled spaces in your skull behind the bones of your face and forehead. They open into your nasal cavity. To do a sinus rinse, you will need:  Saline solution.  Neti pot or spray bottle. This releases the saline solution into your nose and through your sinuses. You can buy neti pots and spray bottles at: ? Your local pharmacy. ? A health food store. ? Online.  When should I do a sinus rinse? A sinus rinse can help to clear your nasal cavity. It can clear:  Mucus.  Dirt.  Dust.  Pollen.  You may do a sinus rinse when you have:  A cold.  A virus.  Allergies.  A sinus infection.  A stuffy nose.  If you are considering a sinus rinse:  Ask your child's doctor before doing a sinus rinse on your child.  Do not do a sinus rinse if you have had: ? Ear or nasal surgery. ? An ear infection. ? Blocked ears.  How do I do a sinus rinse?  Wash your hands.  Disinfect your device using the directions that came with the device.  Dry your device.  Use the solution that comes with your device or one that is sold separately in stores. Follow the mixing directions on the package.  Fill your device with the amount of saline solution as stated in the device instructions.  Stand over a sink and tilt your head sideways over the sink.  Place the spout of the device in your upper nostril (the one closer to the ceiling).  Gently pour or squeeze the saline solution into the nasal cavity. The liquid should drain to the lower nostril if you are not too congested.  Gently blow your nose. Blowing too hard may cause ear pain.  Repeat in the other nostril.  Clean and rinse your device with clean water.  Air-dry your device. Are there risks of a sinus rinse? Sinus rinse is normally very safe and helpful. However, there are a  few risks, which include:  A burning feeling in the sinuses. This may happen if you do not make the saline solution as instructed. Make sure to follow all directions when making the saline solution.  Infection from unclean water. This is rare, but possible.  Nasal irritation.  This information is not intended to replace advice given to you by your health care provider. Make sure you discuss any questions you have with your health care provider. Document Released: 05/04/2014 Document Revised: 09/03/2016 Document Reviewed: 02/22/2014 Elsevier Interactive Patient Education  2017 [employer]  

## 2018-09-30 NOTE — Telephone Encounter (Signed)
Called and spoke to patient. Appointment scheduled for today at 1:45 with Jolene.

## 2019-01-19 ENCOUNTER — Telehealth: Payer: Self-pay | Admitting: Family Medicine

## 2019-01-19 NOTE — Telephone Encounter (Signed)
Called pt to let her know that appt Thursday would be via telephone no answer left message

## 2019-01-19 NOTE — Telephone Encounter (Signed)
Pt notified that PCP will call around appt time.

## 2019-01-19 NOTE — Telephone Encounter (Signed)
Pt returned call. Advised pt of message from [place]. Pt expressed understanding and stated that she would like to make sure that she doesn't need to do anything further but just expect a call?   Please confirm.

## 2019-01-21 ENCOUNTER — Ambulatory Visit (INDEPENDENT_AMBULATORY_CARE_PROVIDER_SITE_OTHER): Payer: BLUE CROSS/BLUE SHIELD | Admitting: Family Medicine

## 2019-01-21 ENCOUNTER — Other Ambulatory Visit: Payer: Self-pay

## 2019-01-21 ENCOUNTER — Encounter: Payer: Self-pay | Admitting: Family Medicine

## 2019-01-21 DIAGNOSIS — E785 Hyperlipidemia, unspecified: Secondary | ICD-10-CM

## 2019-01-21 DIAGNOSIS — E1169 Type 2 diabetes mellitus with other specified complication: Secondary | ICD-10-CM

## 2019-01-21 DIAGNOSIS — I10 Essential (primary) hypertension: Secondary | ICD-10-CM

## 2019-01-21 NOTE — Assessment & Plan Note (Signed)
The current medical regimen is effective;  continue present plan and medications.  

## 2019-01-21 NOTE — Progress Notes (Signed)
   There were no vitals taken for this visit.   Subjective:    Patient ID: Amanda [name], female    DOB: 1956/08/30, 63 y.o.   MRN: [id number]  HPI: Amanda [name] is a 63 y.o. female  Chief Complaint  Patient presents with  . Hypertension  . Hyperlipidemia  . Diabetes  Telemedicine using audio and telecommunications for a synchronous communication visit. Today's visit due to COVID-19 isolation precautions I connected with Amanda [name] and verified that I am speaking with the correct person using two identifiers.   I discussed the limitations, risks, security and privacy concerns of performing an evaluation and management service by telephone and the availability of in person appointments. I also discussed with the patient that there may be a patient responsible charge related to this service. The patient expressed understanding and agreed to proceed. 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 I am at home. 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  Does her best to wash hands and stay clean. Blood pressure no complaints doing well. Cholesterol the same. Diabetes apparently good control with no low blood sugar spells or issues with medications.  Relevant past medical, surgical, family and social history reviewed and updated as indicated. Interim medical history since our last visit reviewed. Allergies and medications reviewed and updated.  Review of Systems  Constitutional: Negative.   Respiratory: Negative.   Cardiovascular: Negative.     Per HPI unless specifically indicated above     Objective:    There were no vitals taken for this visit.  Wt Readings from Last 3 Encounters:  09/30/18 185 lb (83.9 kg)  09/15/18 187 lb 2 oz (84.9 kg)  07/22/18 185 lb (83.9 kg)    Physical Exam none      Assessment & Plan:   Problem List Items Addressed This Visit      Cardiovascular and Mediastinum   Hypertension    The current medical regimen is effective;  continue present plan and medications.         Endocrine   Diabetes mellitus associated with hormonal etiology ([facility])    The current medical regimen is effective;  continue present plan and medications.         Other   Hyperlipidemia    The current medical regimen is effective;  continue present plan and medications.           I discussed the assessment and treatment plan with the patient. The patient was provided an opportunity to ask questions and all were answered. The patient agreed with the plan and demonstrated an understanding of the instructions.   The patient was advised to call back or seek an in-person evaluation if the symptoms worsen or if the condition fails to improve as anticipated.   I provided 21+ minutes of time during this encounter. Follow up plan: Return in about 6 months (around 07/23/2019) for Physical Exam.

## 2019-06-14 ENCOUNTER — Other Ambulatory Visit: Payer: Self-pay | Admitting: Family Medicine

## 2019-07-13 ENCOUNTER — Other Ambulatory Visit: Payer: Self-pay | Admitting: *Deleted

## 2019-07-13 DIAGNOSIS — Z20822 Contact with and (suspected) exposure to covid-19: Secondary | ICD-10-CM

## 2019-07-14 LAB — NOVEL CORONAVIRUS, NAA: SARS-CoV-2, NAA: DETECTED — AB

## 2019-07-19 ENCOUNTER — Other Ambulatory Visit: Payer: Self-pay | Admitting: Family Medicine

## 2019-07-19 ENCOUNTER — Telehealth: Payer: Self-pay

## 2019-07-19 NOTE — Telephone Encounter (Signed)
Verbal from [place] and Wynetta Emery that patient needs to be two weeks post testing and at least 3 days symptom free.  Patient's work notified.  Copied from [place] [id number]. Topic: General - Inquiry >> Jul 19, 2019  3:18 PM Reyne Dumas L wrote: Reason for CRM:   Pt tested positive for COVID but has been told she can return to work tomorrow.  Pt wants to know how long she needs to wait before having a flu shot. Pt can be reached at [phone]

## 2019-08-09 ENCOUNTER — Telehealth: Payer: Self-pay

## 2019-08-09 NOTE — Telephone Encounter (Signed)
Can this patient come in and have labs and a flu shot, has not been 30days since COVID Positive. Copied from [place] [id number]. Topic: Conservator, museum/gallery Patient (Clinic Use ONLY) >> Aug 05, 2019  1:23 PM Pauline Good wrote: Reason for CRM: pt returning  call back from office >> Aug 09, 2019  1:20 PM Scherrie Gerlach wrote: Pt calling back and made aware this will be phone visit. Pt wanted to get her flu shot and states she needs labs as well.  Advised he will need to speak with the dr first before labs. Also, it has not been 30 days since pt covid positive. Please advise if ok to come for flu shot. >> Aug 05, 2019  4:29 PM Georgina Peer, CMA wrote: Laural Roes, looks like you contacted this patient regarding her appointment next week with Dr. Jeananne Rama.

## 2019-08-09 NOTE — Telephone Encounter (Signed)
Yes no problems, this is a safe pt

## 2019-08-10 NOTE — Telephone Encounter (Signed)
Called and left patient a VM letting her know what Dr. Jeananne Rama said. Asked for patient to please call back to schedule a nurse visit for the flu shot.

## 2019-08-12 ENCOUNTER — Other Ambulatory Visit: Payer: Self-pay

## 2019-08-12 ENCOUNTER — Ambulatory Visit (INDEPENDENT_AMBULATORY_CARE_PROVIDER_SITE_OTHER): Payer: BLUE CROSS/BLUE SHIELD | Admitting: Family Medicine

## 2019-08-12 ENCOUNTER — Encounter: Payer: Self-pay | Admitting: Family Medicine

## 2019-08-12 DIAGNOSIS — E785 Hyperlipidemia, unspecified: Secondary | ICD-10-CM

## 2019-08-12 DIAGNOSIS — Z0000 Encounter for general adult medical examination without abnormal findings: Secondary | ICD-10-CM

## 2019-08-12 DIAGNOSIS — E1169 Type 2 diabetes mellitus with other specified complication: Secondary | ICD-10-CM

## 2019-08-12 DIAGNOSIS — I10 Essential (primary) hypertension: Secondary | ICD-10-CM | POA: Diagnosis not present

## 2019-08-12 NOTE — Progress Notes (Signed)
There were no vitals taken for this visit.   Subjective:    Patient ID: Amanda [name], female    DOB: [birth year], 63 y.o.   MRN: [id number]  HPI: Amanda [name] is a 63 y.o. female  Med check Discussed with patient all in all doing well no complaints.  Hypertension blood pressure doing well with medications no side effects. Diabetes no low blood sugar spells or issues doing well. Cholesterol also doing well with no complaints from medications. Patient recovered from COVID-19 with no side effects or lingering issues  Relevant past medical, surgical, family and social history reviewed and updated as indicated. Interim medical history since our last visit reviewed. Allergies and medications reviewed and updated.  Review of Systems  Constitutional: Negative.   Respiratory: Negative.   Cardiovascular: Negative.     Per HPI unless specifically indicated above     Objective:    There were no vitals taken for this visit.  Wt Readings from Last 3 Encounters:  09/30/18 185 lb (83.9 kg)  09/15/18 187 lb 2 oz (84.9 kg)  07/22/18 185 lb (83.9 kg)    Physical Exam  Results for orders placed or performed in visit on 07/13/19  Novel Coronavirus, NAA ([facility])   Specimen: Oropharyngeal(OP) collection in vial transport medium   OROPHARYNGEA  TESTING  Result Value Ref Range   SARS-CoV-2, NAA Detected (A) Not Detected      Assessment & Plan:   Problem List Items Addressed This Visit      Cardiovascular and Mediastinum   Hypertension    The current medical regimen is effective;  continue present plan and medications.         Endocrine   Diabetes mellitus associated with hormonal etiology ([place])    The current medical regimen is effective;  continue present plan and medications.       Relevant Orders   Bayer DCA Hb A1c Waived     Other   Hyperlipidemia    The current medical regimen is effective;  continue present plan and medications.        Other Visit Diagnoses    PE  (physical exam), annual    -  Primary   Relevant Orders   Comprehensive metabolic panel   Lipid panel   CBC with Differential/Platelet   TSH   Urinalysis, Routine w reflex microscopic     Telemedicine using audio/video telecommunications for a synchronous communication visit. Today's visit due to COVID-19 isolation precautions I connected with and verified that I am speaking with the correct person using two identifiers.   I discussed the limitations, risks, security and privacy concerns of performing an evaluation and management service by telecommunication and the availability of in person appointments. I also discussed with the patient that there may be a patient responsible charge related to this service. The patient expressed understanding and agreed to proceed. The patient's location is work I am at home.   I discussed the assessment and treatment plan with the patient. The patient was provided an opportunity to ask questions and all were answered. The patient agreed with the plan and demonstrated an understanding of the instructions.   The patient was advised to call back or seek an in-person evaluation if the symptoms worsen or if the condition fails to improve as anticipated.   I provided 21+ minutes of time during this encounter.  Follow up plan: Return in about 6 months (around 02/10/2020) for Hemoglobin A1c, BMP,  Lipids, ALT, AST. And PE  soon

## 2019-08-12 NOTE — Assessment & Plan Note (Signed)
The current medical regimen is effective;  continue present plan and medications.  

## 2019-08-18 ENCOUNTER — Other Ambulatory Visit: Payer: BLUE CROSS/BLUE SHIELD

## 2019-08-18 ENCOUNTER — Ambulatory Visit (INDEPENDENT_AMBULATORY_CARE_PROVIDER_SITE_OTHER): Payer: BLUE CROSS/BLUE SHIELD

## 2019-08-18 ENCOUNTER — Other Ambulatory Visit: Payer: Self-pay

## 2019-08-18 DIAGNOSIS — E1169 Type 2 diabetes mellitus with other specified complication: Secondary | ICD-10-CM

## 2019-08-18 DIAGNOSIS — Z23 Encounter for immunization: Secondary | ICD-10-CM

## 2019-08-18 DIAGNOSIS — Z0000 Encounter for general adult medical examination without abnormal findings: Secondary | ICD-10-CM

## 2019-08-18 LAB — BAYER DCA HB A1C WAIVED: HB A1C (BAYER DCA - WAIVED): 6.4 % (ref <7.0)

## 2019-08-18 LAB — URINALYSIS, ROUTINE W REFLEX MICROSCOPIC
Bilirubin, UA: NEGATIVE
Leukocytes,UA: NEGATIVE
Nitrite, UA: NEGATIVE
Protein,UA: NEGATIVE
RBC, UA: NEGATIVE
Specific Gravity, UA: 1.025 (ref 1.005–1.030)
Urobilinogen, Ur: 1 mg/dL (ref 0.2–1.0)
pH, UA: 5 (ref 5.0–7.5)

## 2019-08-19 LAB — CBC WITH DIFFERENTIAL/PLATELET
Basophils Absolute: 0 10*3/uL (ref 0.0–0.2)
Basos: 0 %
EOS (ABSOLUTE): 0.3 10*3/uL (ref 0.0–0.4)
Eos: 6 %
Hematocrit: 33.7 % — ABNORMAL LOW (ref 34.0–46.6)
Hemoglobin: 11.5 g/dL (ref 11.1–15.9)
Immature Grans (Abs): 0 10*3/uL (ref 0.0–0.1)
Immature Granulocytes: 0 %
Lymphocytes Absolute: 1.8 10*3/uL (ref 0.7–3.1)
Lymphs: 30 %
MCH: 30.2 pg (ref 26.6–33.0)
MCHC: 34.1 g/dL (ref 31.5–35.7)
MCV: 89 fL (ref 79–97)
Monocytes Absolute: 0.4 10*3/uL (ref 0.1–0.9)
Monocytes: 7 %
Neutrophils Absolute: 3.3 10*3/uL (ref 1.4–7.0)
Neutrophils: 57 %
Platelets: 204 10*3/uL (ref 150–450)
RBC: 3.81 x10E6/uL (ref 3.77–5.28)
RDW: 13.2 % (ref 11.7–15.4)
WBC: 5.9 10*3/uL (ref 3.4–10.8)

## 2019-08-19 LAB — COMPREHENSIVE METABOLIC PANEL
ALT: 22 IU/L (ref 0–32)
AST: 25 IU/L (ref 0–40)
Albumin/Globulin Ratio: 1.5 (ref 1.2–2.2)
Albumin: 4.1 g/dL (ref 3.8–4.8)
Alkaline Phosphatase: 77 IU/L (ref 39–117)
BUN/Creatinine Ratio: 22 (ref 12–28)
BUN: 27 mg/dL (ref 8–27)
Bilirubin Total: 0.3 mg/dL (ref 0.0–1.2)
CO2: 25 mmol/L (ref 20–29)
Calcium: 9.3 mg/dL (ref 8.7–10.3)
Chloride: 104 mmol/L (ref 96–106)
Creatinine, Ser: 1.21 mg/dL — ABNORMAL HIGH (ref 0.57–1.00)
GFR calc Af Amer: 55 mL/min/{1.73_m2} — ABNORMAL LOW (ref >59)
GFR calc non Af Amer: 48 mL/min/{1.73_m2} — ABNORMAL LOW (ref >59)
Globulin, Total: 2.7 g/dL (ref 1.5–4.5)
Glucose: 64 mg/dL — ABNORMAL LOW (ref 65–99)
Potassium: 4.2 mmol/L (ref 3.5–5.2)
Sodium: 144 mmol/L (ref 134–144)
Total Protein: 6.8 g/dL (ref 6.0–8.5)

## 2019-08-19 LAB — LIPID PANEL
Chol/HDL Ratio: 3.1 ratio (ref 0.0–4.4)
Cholesterol, Total: 158 mg/dL (ref 100–199)
HDL: 51 mg/dL (ref >39)
LDL Chol Calc (NIH): 75 mg/dL (ref 0–99)
Triglycerides: 193 mg/dL — ABNORMAL HIGH (ref 0–149)
VLDL Cholesterol Cal: 32 mg/dL (ref 5–40)

## 2019-08-19 LAB — TSH: TSH: 2.98 u[IU]/mL (ref 0.450–4.500)

## 2019-08-22 ENCOUNTER — Other Ambulatory Visit: Payer: Self-pay | Admitting: Family Medicine

## 2019-08-22 DIAGNOSIS — E119 Type 2 diabetes mellitus without complications: Secondary | ICD-10-CM

## 2019-08-22 DIAGNOSIS — E785 Hyperlipidemia, unspecified: Secondary | ICD-10-CM

## 2019-08-22 DIAGNOSIS — I10 Essential (primary) hypertension: Secondary | ICD-10-CM

## 2019-09-18 ENCOUNTER — Encounter: Payer: Self-pay | Admitting: Nurse Practitioner

## 2019-09-18 DIAGNOSIS — E1121 Type 2 diabetes mellitus with diabetic nephropathy: Secondary | ICD-10-CM | POA: Insufficient documentation

## 2019-09-18 DIAGNOSIS — E1165 Type 2 diabetes mellitus with hyperglycemia: Secondary | ICD-10-CM | POA: Insufficient documentation

## 2019-09-18 DIAGNOSIS — N1831 Chronic kidney disease, stage 3a: Secondary | ICD-10-CM | POA: Insufficient documentation

## 2019-09-18 DIAGNOSIS — N182 Chronic kidney disease, stage 2 (mild): Secondary | ICD-10-CM | POA: Insufficient documentation

## 2019-09-18 DIAGNOSIS — E1122 Type 2 diabetes mellitus with diabetic chronic kidney disease: Secondary | ICD-10-CM | POA: Insufficient documentation

## 2019-09-21 ENCOUNTER — Ambulatory Visit (INDEPENDENT_AMBULATORY_CARE_PROVIDER_SITE_OTHER): Payer: BLUE CROSS/BLUE SHIELD | Admitting: Nurse Practitioner

## 2019-09-21 ENCOUNTER — Encounter: Payer: Self-pay | Admitting: Nurse Practitioner

## 2019-09-21 ENCOUNTER — Encounter: Payer: Self-pay | Admitting: Family Medicine

## 2019-09-21 ENCOUNTER — Other Ambulatory Visit: Payer: Self-pay

## 2019-09-21 VITALS — BP 109/69 | HR 59 | Temp 97.8°F | Ht 60.7 in | Wt 189.4 lb

## 2019-09-21 DIAGNOSIS — E1159 Type 2 diabetes mellitus with other circulatory complications: Secondary | ICD-10-CM | POA: Diagnosis not present

## 2019-09-21 DIAGNOSIS — Z0000 Encounter for general adult medical examination without abnormal findings: Secondary | ICD-10-CM | POA: Diagnosis not present

## 2019-09-21 DIAGNOSIS — I152 Hypertension secondary to endocrine disorders: Secondary | ICD-10-CM

## 2019-09-21 DIAGNOSIS — I10 Essential (primary) hypertension: Secondary | ICD-10-CM

## 2019-09-21 DIAGNOSIS — E1169 Type 2 diabetes mellitus with other specified complication: Secondary | ICD-10-CM | POA: Diagnosis not present

## 2019-09-21 DIAGNOSIS — E1122 Type 2 diabetes mellitus with diabetic chronic kidney disease: Secondary | ICD-10-CM

## 2019-09-21 DIAGNOSIS — E1121 Type 2 diabetes mellitus with diabetic nephropathy: Secondary | ICD-10-CM

## 2019-09-21 DIAGNOSIS — N183 Chronic kidney disease, stage 3 unspecified: Secondary | ICD-10-CM

## 2019-09-21 DIAGNOSIS — E785 Hyperlipidemia, unspecified: Secondary | ICD-10-CM

## 2019-09-21 NOTE — Assessment & Plan Note (Signed)
Chronic, ongoing with A1C 6.4% recently.  Have discussed with patient trial off Glimepiride now she has new glucometer and monitor BS TID.  If elevations > 130 in morning noted, notify provider.  Goal is to discontinue Glimepiride due to hypoglycemia in past with this and in long run would benefit from d/c of Onglyza and increase in Trulicity if needed (due to medications having similar mechanism).  Discussed this plan at length with her.  Plan to see her back in 2 months for f/u.

## 2019-09-21 NOTE — Progress Notes (Signed)
BP 109/69   Pulse (!) 59   Temp 97.8 F (36.6 C) (Oral)   Ht 5' 0.7" (1.542 m)   Wt 189 lb 6.4 oz (85.9 kg)   SpO2 99%   BMI 36.14 kg/m    Subjective:    Patient ID: Amanda [name], female    DOB: 02-22-1956, 63 y.o.   MRN: [id number]  HPI: Amanda [name] is a 63 y.o. female presenting on 09/21/2019 for comprehensive medical examination. Current medical complaints include:none   She currently lives with: boyfriend Menopausal Symptoms: no   DIABETES Last A1C in October 6.4%.  Continues on [place], Glimepiride, Onglyza, Trulicity.  Hypoglycemic episodes:no Polydipsia/polyuria: no Visual disturbance: no Chest pain: no Paresthesias: no Glucose Monitoring: no  Accucheck frequency: Not Checking, just got a new meter and plans to check, prior to this had checked twice a day  Fasting glucose:  Post prandial:  Evening:  Before meals: Taking Insulin?: no  Long acting insulin:  Short acting insulin: Blood Pressure Monitoring: rarely Retinal Examination: Not To Date Foot Exam: Up to Date Pneumovax: Up to Date Influenza: Up to Date Aspirin: yes   HYPERTENSION / HYPERLIPIDEMIA Satisfied with current treatment? yes Duration of hypertension: chronic BP monitoring frequency: rarely BP range:  BP medication side effects: no Duration of hyperlipidemia: chronic Cholesterol medication side effects: no Cholesterol supplements: none Medication compliance: good compliance Aspirin: yes Recent stressors: no Recurrent headaches: no Visual changes: no Palpitations: no Dyspnea: no Chest pain: no Lower extremity edema: no Dizzy/lightheaded: no   CHRONIC KIDNEY DISEASE CKD status: stable Medications renally dose: yes Previous renal evaluation: no Pneumovax:  Up to Date Influenza Vaccine:  Up to Date  Depression Screen done today and results listed below:  Depression screen [facility] 2/9 09/21/2019 07/22/2018 03/26/2018 10/28/2017 07/16/2017  Decreased Interest 0 1 0 - 0  Down, Depressed,  Hopeless 0 - 0 0 0  PHQ - 2 Score 0 1 0 0 0  Altered sleeping - 1 - - -  Tired, decreased energy - 1 - - -  Change in appetite - - - - -  Feeling bad or failure about yourself  - 0 - - -  Trouble concentrating - 0 - - -  Moving slowly or fidgety/restless - 0 - - -  Suicidal thoughts - 0 - - -  PHQ-9 Score - 3 - - -    The patient does not have a history of falls. I did not complete a risk assessment for falls. A plan of care for falls was not documented.   Past Medical History:  Past Medical History:  Diagnosis Date  . Diabetes mellitus without complication (HCC)   . Hyperlipidemia   . Hypertension     Surgical History:  Past Surgical History:  Procedure Laterality Date  . COLONOSCOPY  2012    Medications:  Current Outpatient Medications on File Prior to Visit  Medication Sig  . aspirin (ASPIRIN EC) 81 MG EC tablet Take 81 mg by mouth daily. Swallow whole.  . benazepril (LOTENSIN) 40 MG tablet Take 1 tablet by mouth once daily  . Calcium Carb-Cholecalciferol (CALCIUM 600+D3) 600-200 MG-UNIT TABS Take 1 tablet by mouth daily.  . carvedilol (COREG) 25 MG tablet TAKE 1 TABLET BY MOUTH TWICE DAILY WITH MEALS  . Dulaglutide (TRULICITY) 1.5 MG/0.5ML SOPN INJECT 1.5 MG  SUBCUTANEOUSLY ONCE A WEEK  . glimepiride (AMARYL) 4 MG tablet Take 1 tablet by mouth once daily with breakfast  . hydrochlorothiazide (HYDRODIURIL) 25 MG tablet  Take 1 tablet by mouth once daily  . lovastatin (MEVACOR) 20 MG tablet TAKE 2 TABLETS BY MOUTH AT BEDTIME  . meloxicam (MOBIC) 15 MG tablet Take 1 tablet by mouth once daily  . Multiple Vitamin (MULTIVITAMIN) tablet Take 1 tablet by mouth daily.  Marland Kitchen omeprazole (PRILOSEC) 20 MG capsule Take 20 mg by mouth daily.  . ONGLYZA 5 MG TABS tablet Take 1 tablet by mouth once daily  . SYNJARDY XR 12.02-999 MG TB24 Take 1 tablet by mouth twice daily  . traZODone (DESYREL) 50 MG tablet TAKE 1 TABLET BY MOUTH AT BEDTIME   No current facility-administered medications  on file prior to visit.     Allergies:  Allergies  Allergen Reactions  . Penicillins   . Tramadol Other (See Comments)    "woozy"    Social History:  Social History   Socioeconomic History  . Marital status: Single    Spouse name: Not on file  . Number of children: Not on file  . Years of education: Not on file  . Highest education level: Not on file  Occupational History  . Not on file  Social Needs  . Financial resource strain: Not on file  . Food insecurity    Worry: Not on file    Inability: Not on file  . Transportation needs    Medical: Not on file    Non-medical: Not on file  Tobacco Use  . Smoking status: Former Smoker    Quit date: 12/07/1975    Years since quitting: 43.8  . Smokeless tobacco: Never Used  Substance and Sexual Activity  . Alcohol use: No  . Drug use: No  . Sexual activity: Not on file  Lifestyle  . Physical activity    Days per week: Not on file    Minutes per session: Not on file  . Stress: Not on file  Relationships  . Social Musician on phone: Not on file    Gets together: Not on file    Attends religious service: Not on file    Active member of club or organization: Not on file    Attends meetings of clubs or organizations: Not on file    Relationship status: Not on file  . Intimate partner violence    Fear of current or ex partner: Not on file    Emotionally abused: Not on file    Physically abused: Not on file    Forced sexual activity: Not on file  Other Topics Concern  . Not on file  Social History Narrative  . Not on file   Social History   Tobacco Use  Smoking Status Former Smoker  . Quit date: 12/07/1975  . Years since quitting: 43.8  Smokeless Tobacco Never Used   Social History   Substance and Sexual Activity  Alcohol Use No    Family History:  Family History  Problem Relation Age of Onset  . Hypertension Mother   . Diabetes Mother   . Diabetes Sister   . Heart disease Sister   . Heart  disease Sister     Past medical history, surgical history, medications, allergies, family history and social history reviewed with patient today and changes made to appropriate areas of the chart.   Review of Systems - negative All other ROS negative except what is listed above and in the HPI.      Objective:    BP 109/69   Pulse (!) 59   Temp 97.8 F (  36.6 C) (Oral)   Ht 5' 0.7" (1.542 m)   Wt 189 lb 6.4 oz (85.9 kg)   SpO2 99%   BMI 36.14 kg/m   Wt Readings from Last 3 Encounters:  09/21/19 189 lb 6.4 oz (85.9 kg)  09/30/18 185 lb (83.9 kg)  09/15/18 187 lb 2 oz (84.9 kg)    Physical Exam Constitutional:      General: She is awake. She is not in acute distress.    Appearance: She is well-developed. She is not ill-appearing.  HENT:     Head: Normocephalic and atraumatic.     Right Ear: Hearing, tympanic membrane, ear canal and external ear normal. No drainage.     Left Ear: Hearing, tympanic membrane, ear canal and external ear normal. No drainage.     Nose: Nose normal.     Right Sinus: No maxillary sinus tenderness or frontal sinus tenderness.     Left Sinus: No maxillary sinus tenderness or frontal sinus tenderness.     Mouth/Throat:     Mouth: Mucous membranes are moist.     Pharynx: Oropharynx is clear. Uvula midline. No pharyngeal swelling, oropharyngeal exudate or posterior oropharyngeal erythema.  Eyes:     General: Lids are normal.        Right eye: No discharge.        Left eye: No discharge.     Extraocular Movements: Extraocular movements intact.     Conjunctiva/sclera: Conjunctivae normal.     Pupils: Pupils are equal, round, and reactive to light.     Visual Fields: Right eye visual fields normal and left eye visual fields normal.  Neck:     Musculoskeletal: Normal range of motion and neck supple.     Thyroid: No thyromegaly.     Vascular: No carotid bruit.     Trachea: Trachea normal.  Cardiovascular:     Rate and Rhythm: Normal rate and regular  rhythm.     Heart sounds: Normal heart sounds. No murmur. No gallop.   Pulmonary:     Effort: Pulmonary effort is normal. No accessory muscle usage or respiratory distress.     Breath sounds: Normal breath sounds.  Chest:     Breasts:        Right: Normal.        Left: Normal.  Abdominal:     General: Bowel sounds are normal.     Palpations: Abdomen is soft. There is no hepatomegaly or splenomegaly.     Tenderness: There is no abdominal tenderness.  Musculoskeletal: Normal range of motion.     Right lower leg: No edema.     Left lower leg: No edema.  Lymphadenopathy:     Head:     Right side of head: No submental, submandibular, tonsillar, preauricular or posterior auricular adenopathy.     Left side of head: No submental, submandibular, tonsillar, preauricular or posterior auricular adenopathy.     Cervical: No cervical adenopathy.     Upper Body:     Right upper body: No supraclavicular, axillary or pectoral adenopathy.     Left upper body: No supraclavicular, axillary or pectoral adenopathy.  Skin:    General: Skin is warm and dry.     Capillary Refill: Capillary refill takes less than 2 seconds.     Findings: No rash.  Neurological:     Mental Status: She is alert and oriented to person, place, and time.     Cranial Nerves: Cranial nerves are intact.  Gait: Gait is intact.     Deep Tendon Reflexes: Reflexes are normal and symmetric.     Reflex Scores:      Brachioradialis reflexes are 2+ on the right side and 2+ on the left side.      Patellar reflexes are 2+ on the right side and 2+ on the left side. Psychiatric:        Attention and Perception: Attention normal.        Mood and Affect: Mood normal.        Speech: Speech normal.        Behavior: Behavior normal. Behavior is cooperative.        Thought Content: Thought content normal.        Judgment: Judgment normal.    Results for orders placed or performed in visit on 08/18/19  Bayer DCA Hb A1c Waived  Result  Value Ref Range   HB A1C (BAYER DCA - WAIVED) 6.4 <7.0 %  Urinalysis, Routine w reflex microscopic  Result Value Ref Range   Specific Gravity, UA 1.025 1.005 - 1.030   pH, UA 5.0 5.0 - 7.5   Color, UA Yellow Yellow   Appearance Ur Cloudy (A) Clear   Leukocytes,UA Negative Negative   Protein,UA Negative Negative/Trace   Glucose, UA 3+ (A) Negative   Ketones, UA Trace (A) Negative   RBC, UA Negative Negative   Bilirubin, UA Negative Negative   Urobilinogen, Ur 1.0 0.2 - 1.0 mg/dL   Nitrite, UA Negative Negative  TSH  Result Value Ref Range   TSH 2.980 0.450 - 4.500 uIU/mL  CBC with Differential/Platelet  Result Value Ref Range   WBC 5.9 3.4 - 10.8 x10E3/uL   RBC 3.81 3.77 - 5.28 x10E6/uL   Hemoglobin 11.5 11.1 - 15.9 g/dL   Hematocrit 16.133.7 (L) 09.634.0 - 46.6 %   MCV 89 79 - 97 fL   MCH 30.2 26.6 - 33.0 pg   MCHC 34.1 31.5 - 35.7 g/dL   RDW 04.513.2 40.911.7 - 81.115.4 %   Platelets 204 150 - 450 x10E3/uL   Neutrophils 57 Not Estab. %   Lymphs 30 Not Estab. %   Monocytes 7 Not Estab. %   Eos 6 Not Estab. %   Basos 0 Not Estab. %   Neutrophils Absolute 3.3 1.4 - 7.0 x10E3/uL   Lymphocytes Absolute 1.8 0.7 - 3.1 x10E3/uL   Monocytes Absolute 0.4 0.1 - 0.9 x10E3/uL   EOS (ABSOLUTE) 0.3 0.0 - 0.4 x10E3/uL   Basophils Absolute 0.0 0.0 - 0.2 x10E3/uL   Immature Granulocytes 0 Not Estab. %   Immature Grans (Abs) 0.0 0.0 - 0.1 x10E3/uL  Lipid panel  Result Value Ref Range   Cholesterol, Total 158 100 - 199 mg/dL   Triglycerides 914193 (H) 0 - 149 mg/dL   HDL 51 >78>39 mg/dL   VLDL Cholesterol Cal 32 5 - 40 mg/dL   LDL Chol Calc (NIH) 75 0 - 99 mg/dL   Chol/HDL Ratio 3.1 0.0 - 4.4 ratio  Comprehensive metabolic panel  Result Value Ref Range   Glucose 64 (L) 65 - 99 mg/dL   BUN 27 8 - 27 mg/dL   Creatinine, Ser 2.951.21 (H) 0.57 - 1.00 mg/dL   GFR calc non Af Amer 48 (L) >59 mL/min/1.73   GFR calc Af Amer 55 (L) >59 mL/min/1.73   BUN/Creatinine Ratio 22 12 - 28   Sodium 144 134 - 144 mmol/L    Potassium 4.2 3.5 - 5.2 mmol/L   Chloride  104 96 - 106 mmol/L   CO2 25 20 - 29 mmol/L   Calcium 9.3 8.7 - 10.3 mg/dL   Total Protein 6.8 6.0 - 8.5 g/dL   Albumin 4.1 3.8 - 4.8 g/dL   Globulin, Total 2.7 1.5 - 4.5 g/dL   Albumin/Globulin Ratio 1.5 1.2 - 2.2   Bilirubin Total 0.3 0.0 - 1.2 mg/dL   Alkaline Phosphatase 77 39 - 117 IU/L   AST 25 0 - 40 IU/L   ALT 22 0 - 32 IU/L      Assessment & Plan:   Problem List Items Addressed This Visit      Cardiovascular and Mediastinum   Hypertension associated with diabetes (HCC)    Chronic, stable with BP at goal and A1C below goal.  Continue current medication regimen and adjust as needed, Lotensin for kidney protection.  Labs obtained at end of October and reviewed with patient.  Return in 2 months.        Endocrine   CKD stage 3 due to type 2 diabetes mellitus (HCC)    Chronic, ongoing, stable at this time.  Continue Lotensin for kidney protection.  A1C remains below goal.  Labs obtained at end of October and reviewed with patient.  Return in 2 months.      Hyperlipidemia associated with type 2 diabetes mellitus (HCC)    Chronic, ongoing.  Continue current medication regimen and adjust as needed.  Labs obtained at end of October and reviewed with patient.  Return in 2 months.      Type 2 diabetes with nephropathy (HCC)    Chronic, ongoing with A1C 6.4% recently.  Have discussed with patient trial off Glimepiride now she has new glucometer and monitor BS TID.  If elevations > 130 in morning noted, notify provider.  Goal is to discontinue Glimepiride due to hypoglycemia in past with this and in long run would benefit from d/c of Onglyza and increase in Trulicity if needed (due to medications having similar mechanism).  Discussed this plan at length with her.  Plan to see her back in 2 months for f/u.       Other Visit Diagnoses    Annual physical exam    -  Primary       Follow up plan: Return in about 2 months (around 11/22/2019)  for T2dM, HTN/HLD, CKD.   LABORATORY TESTING:  - Pap smear: up to date  IMMUNIZATIONS:   - Tdap: Tetanus vaccination status reviewed: last tetanus booster within 10 years. - Influenza: Up to date - Pneumovax: Up to date - Prevnar: Not applicable - HPV: Not applicable - Zostavax vaccine: wishes to think about this  SCREENING: -Mammogram: Refused , at length discussion with her on benefit of screening - Colonoscopy: Up to date  - Bone Density: Not applicable  -Hearing Test: Not applicable  -Spirometry: Not applicable   PATIENT COUNSELING:   Advised to take 1 mg of folate supplement per day if capable of pregnancy.   Sexuality: Discussed sexually transmitted diseases, partner selection, use of condoms, avoidance of unintended pregnancy  and contraceptive alternatives.   Advised to avoid cigarette smoking.  I discussed with the patient that most people either abstain from alcohol or drink within safe limits (<=14/week and <=4 drinks/occasion for males, <=7/weeks and <= 3 drinks/occasion for females) and that the risk for alcohol disorders and other health effects rises proportionally with the number of drinks per week and how often a drinker exceeds daily limits.  Discussed cessation/primary  prevention of drug use and availability of treatment for abuse.   Diet: Encouraged to adjust caloric intake to maintain  or achieve ideal body weight, to reduce intake of dietary saturated fat and total fat, to limit sodium intake by avoiding high sodium foods and not adding table salt, and to maintain adequate dietary potassium and calcium preferably from fresh fruits, vegetables, and low-fat dairy products.    stressed the importance of regular exercise  Injury prevention: Discussed safety belts, safety helmets, smoke detector, smoking near bedding or upholstery.   Dental health: Discussed importance of regular tooth brushing, flossing, and dental visits.    NEXT PREVENTATIVE PHYSICAL DUE  IN 1 YEAR. Return in about 2 months (around 11/22/2019) for T2dM, HTN/HLD, CKD.

## 2019-09-21 NOTE — Assessment & Plan Note (Signed)
Chronic, ongoing.  Continue current medication regimen and adjust as needed.  Labs obtained at end of October and reviewed with patient.  Return in 2 months.

## 2019-09-21 NOTE — Assessment & Plan Note (Signed)
Chronic, ongoing, stable at this time.  Continue Lotensin for kidney protection.  A1C remains below goal.  Labs obtained at end of October and reviewed with patient.  Return in 2 months.

## 2019-09-21 NOTE — Patient Instructions (Signed)
Carbohydrate Counting for Diabetes Mellitus, Adult  Carbohydrate counting is a method of keeping track of how many carbohydrates you eat. Eating carbohydrates naturally increases the amount of sugar (glucose) in the blood. Counting how many carbohydrates you eat helps keep your blood glucose within normal limits, which helps you manage your diabetes (diabetes mellitus). It is important to know how many carbohydrates you can safely have in each meal. This is different for every person. A diet and nutrition specialist (registered dietitian) can help you make a meal plan and calculate how many carbohydrates you should have at each meal and snack. Carbohydrates are found in the following foods:  Grains, such as breads and cereals.  Dried beans and soy products.  Starchy vegetables, such as potatoes, peas, and corn.  Fruit and fruit juices.  Milk and yogurt.  Sweets and snack foods, such as cake, cookies, candy, chips, and soft drinks. How do I count carbohydrates? There are two ways to count carbohydrates in food. You can use either of the methods or a combination of both. Reading "Nutrition Facts" on packaged food The "Nutrition Facts" list is included on the labels of almost all packaged foods and beverages in the [place] It includes:  The serving size.  Information about nutrients in each serving, including the grams (g) of carbohydrate per serving. To use the "Nutrition Facts":  Decide how many servings you will have.  Multiply the number of servings by the number of carbohydrates per serving.  The resulting number is the total amount of carbohydrates that you will be having. Learning standard serving sizes of other foods When you eat carbohydrate foods that are not packaged or do not include "Nutrition Facts" on the label, you need to measure the servings in order to count the amount of carbohydrates:  Measure the foods that you will eat with a food scale or measuring cup, if needed.   Decide how many standard-size servings you will eat.  Multiply the number of servings by 15. Most carbohydrate-rich foods have about 15 g of carbohydrates per serving. ? For example, if you eat 8 oz (170 g) of strawberries, you will have eaten 2 servings and 30 g of carbohydrates (2 servings x 15 g = 30 g).  For foods that have more than one food mixed, such as soups and casseroles, you must count the carbohydrates in each food that is included. The following list contains standard serving sizes of common carbohydrate-rich foods. Each of these servings has about 15 g of carbohydrates:   hamburger bun or  English muffin.   oz (15 mL) syrup.   oz (14 g) jelly.  1 slice of bread.  1 six-inch tortilla.  3 oz (85 g) cooked rice or pasta.  4 oz (113 g) cooked dried beans.  4 oz (113 g) starchy vegetable, such as peas, corn, or potatoes.  4 oz (113 g) hot cereal.  4 oz (113 g) mashed potatoes or  of a large baked potato.  4 oz (113 g) canned or frozen fruit.  4 oz (120 mL) fruit juice.  4-6 crackers.  6 chicken nuggets.  6 oz (170 g) unsweetened dry cereal.  6 oz (170 g) plain fat-free yogurt or yogurt sweetened with artificial sweeteners.  8 oz (240 mL) milk.  8 oz (170 g) fresh fruit or one small piece of fruit.  24 oz (680 g) popped popcorn. Example of carbohydrate counting Sample meal  3 oz (85 g) chicken breast.  6 oz (170 g)   brown rice.  4 oz (113 g) corn.  8 oz (240 mL) milk.  8 oz (170 g) strawberries with sugar-free whipped topping. Carbohydrate calculation 1. Identify the foods that contain carbohydrates: ? Rice. ? Corn. ? Milk. ? Strawberries. 2. Calculate how many servings you have of each food: ? 2 servings rice. ? 1 serving corn. ? 1 serving milk. ? 1 serving strawberries. 3. Multiply each number of servings by 15 g: ? 2 servings rice x 15 g = 30 g. ? 1 serving corn x 15 g = 15 g. ? 1 serving milk x 15 g = 15 g. ? 1 serving  strawberries x 15 g = 15 g. 4. Add together all of the amounts to find the total grams of carbohydrates eaten: ? 30 g + 15 g + 15 g + 15 g = 75 g of carbohydrates total. Summary  Carbohydrate counting is a method of keeping track of how many carbohydrates you eat.  Eating carbohydrates naturally increases the amount of sugar (glucose) in the blood.  Counting how many carbohydrates you eat helps keep your blood glucose within normal limits, which helps you manage your diabetes.  A diet and nutrition specialist (registered dietitian) can help you make a meal plan and calculate how many carbohydrates you should have at each meal and snack. This information is not intended to replace advice given to you by your health care provider. Make sure you discuss any questions you have with your health care provider. Document Released: 10/07/2005 Document Revised: 05/01/2017 Document Reviewed: 03/20/2016 Elsevier Patient Education  2020 [employer]  

## 2019-09-21 NOTE — Assessment & Plan Note (Signed)
Chronic, stable with BP at goal and A1C below goal.  Continue current medication regimen and adjust as needed, Lotensin for kidney protection.  Labs obtained at end of October and reviewed with patient.  Return in 2 months.

## 2019-09-27 ENCOUNTER — Telehealth: Payer: Self-pay

## 2019-09-27 DIAGNOSIS — E785 Hyperlipidemia, unspecified: Secondary | ICD-10-CM

## 2019-09-27 DIAGNOSIS — I10 Essential (primary) hypertension: Secondary | ICD-10-CM

## 2019-09-27 DIAGNOSIS — E119 Type 2 diabetes mellitus without complications: Secondary | ICD-10-CM

## 2019-09-27 MED ORDER — LOVASTATIN 20 MG PO TABS: 40.0000 mg | ORAL_TABLET | Freq: Every day | ORAL | 3 refills | Status: DC

## 2019-09-27 MED ORDER — CARVEDILOL 25 MG PO TABS: 25.0000 mg | ORAL_TABLET | Freq: Two times a day (BID) | ORAL | 2 refills | Status: DC

## 2019-09-27 MED ORDER — TRAZODONE HCL 50 MG PO TABS: 50.0000 mg | ORAL_TABLET | Freq: Every day | ORAL | 2 refills | Status: DC

## 2019-09-27 MED ORDER — SAXAGLIPTIN HCL 5 MG PO TABS: 5.0000 mg | ORAL_TABLET | Freq: Every day | ORAL | 3 refills | Status: DC

## 2019-09-27 MED ORDER — BENAZEPRIL HCL 40 MG PO TABS: 40.0000 mg | ORAL_TABLET | Freq: Every day | ORAL | 3 refills | Status: DC

## 2019-09-27 MED ORDER — HYDROCHLOROTHIAZIDE 25 MG PO TABS: 25.0000 mg | ORAL_TABLET | Freq: Every day | ORAL | 3 refills | Status: DC

## 2019-09-27 NOTE — Telephone Encounter (Signed)
[place] faxed these Rx refill requests

## 2019-09-28 ENCOUNTER — Other Ambulatory Visit: Payer: Self-pay | Admitting: Nurse Practitioner

## 2019-09-28 DIAGNOSIS — E119 Type 2 diabetes mellitus without complications: Secondary | ICD-10-CM

## 2019-09-28 MED ORDER — TRULICITY 1.5 MG/0.5ML ~~LOC~~ SOAJ: SUBCUTANEOUS | 12 refills | Status: DC

## 2019-09-28 MED ORDER — GLIMEPIRIDE 4 MG PO TABS: 4.0000 mg | ORAL_TABLET | Freq: Every day | ORAL | 1 refills | Status: DC

## 2019-09-28 MED ORDER — SYNJARDY XR 12.5-1000 MG PO TB24: 1.0000 | ORAL_TABLET | Freq: Two times a day (BID) | ORAL | 0 refills | Status: DC

## 2019-09-28 NOTE — Telephone Encounter (Signed)
Requested medication (s) are due for refill today: yes  Requested medication (s) are on the active medication list: yes  Last refill: 08/23/2019  Future visit scheduled: yes  Notes to clinic:  Signature required    Requested Prescriptions  Pending Prescriptions Disp Refills   glimepiride (AMARYL) 4 MG tablet 30 tablet 0     Endocrinology:  Diabetes - Sulfonylureas Passed - 09/28/2019  7:53 AM      Passed - HBA1C is between 0 and 7.9 and within 180 days    Hemoglobin A1C  Date Value Ref Range Status  06/12/2016 7.8  Final   HB A1C (BAYER DCA - WAIVED)  Date Value Ref Range Status  08/18/2019 6.4 <7.0 % Final    Comment:                                          Diabetic Adult            <7.0                                       Healthy Adult        4.3 - 5.7                                                           (DCCT/NGSP) American Diabetes Association's Summary of Glycemic Recommendations for Adults with Diabetes: Hemoglobin A1c <7.0%. More stringent glycemic goals (A1c <6.0%) may further reduce complications at the cost of increased risk of hypoglycemia.          Passed - Valid encounter within last 6 months    Recent Outpatient Visits          1 week ago Annual physical exam   [place], Barbaraann Faster, NP   1 month ago PE (physical exam), annual   [place], Jeannette How, MD   8 months ago Essential hypertension   [place], Jeannette How, MD   12 months ago Acute non-recurrent frontal sinusitis   [place], Barbaraann Faster, NP   1 year ago [place], [place] T, NP      Future Appointments            In 2 months Cannady, Barbaraann Faster, NP [name]           Signed Prescriptions Disp Refills   Empagliflozin-metFORMIN HCl ER (SYNJARDY XR) 12.02-999 MG TB24 60 tablet 0    Sig: Take 1 tablet by mouth 2 (two) times daily.     Endocrinology:  Diabetes - Biguanide + SGLT2 Inhibitor Combos Failed - 09/28/2019  7:53 AM      Failed - Cr in normal range and within 360 days    Creatinine, Ser  Date Value Ref Range Status  08/18/2019 1.21 (H) 0.57 - 1.00 mg/dL Final         Failed - LDL in normal range and within 360 days    LDL Chol Calc (NIH)  Date Value Ref Range Status  08/18/2019 75 0 - 99 mg/dL Final  Failed - eGFR in normal range and within 360 days    GFR calc Af Amer  Date Value Ref Range Status  08/18/2019 55 (L) >59 mL/min/1.73 Final   GFR calc non Af Amer  Date Value Ref Range Status  08/18/2019 48 (L) >59 mL/min/1.73 Final         Passed - HBA1C is between 0 and 7.9 and within 180 days    Hemoglobin A1C  Date Value Ref Range Status  06/12/2016 7.8  Final   HB A1C (BAYER DCA - WAIVED)  Date Value Ref Range Status  08/18/2019 6.4 <7.0 % Final    Comment:                                          Diabetic Adult            <7.0                                       Healthy Adult        4.3 - 5.7                                                           (DCCT/NGSP) American Diabetes Association's Summary of Glycemic Recommendations for Adults with Diabetes: Hemoglobin A1c <7.0%. More stringent glycemic goals (A1c <6.0%) may further reduce complications at the cost of increased risk of hypoglycemia.          Passed - Valid encounter within last 6 months    Recent Outpatient Visits          1 week ago Annual physical exam   [place], Barbaraann Faster, NP   1 month ago PE (physical exam), annual   [place] Crissman, Jeannette How, MD   8 months ago Essential hypertension   [place], Jeannette How, MD   12 months ago Acute non-recurrent frontal sinusitis   [place], Barbaraann Faster, NP   1 year ago [place], [place] T, NP      Future Appointments            In 2 months Cannady,  [place] T, NP [name]            Dulaglutide (TRULICITY) 1.5 HW/2.9HB SOPN 2 pen 12    Sig: INJECT 1.5 MG  SUBCUTANEOUSLY ONCE A WEEK     Endocrinology:  Diabetes - GLP-1 Receptor Agonists Passed - 09/28/2019  7:53 AM      Passed - HBA1C is between 0 and 7.9 and within 180 days    Hemoglobin A1C  Date Value Ref Range Status  06/12/2016 7.8  Final   HB A1C (BAYER DCA - WAIVED)  Date Value Ref Range Status  08/18/2019 6.4 <7.0 % Final    Comment:                                          Diabetic Adult            <  7.0                                       Healthy Adult        4.3 - 5.7                                                           (DCCT/NGSP) American Diabetes Association's Summary of Glycemic Recommendations for Adults with Diabetes: Hemoglobin A1c <7.0%. More stringent glycemic goals (A1c <6.0%) may further reduce complications at the cost of increased risk of hypoglycemia.          Passed - Valid encounter within last 6 months    Recent Outpatient Visits          1 week ago Annual physical exam   [place], Barbaraann Faster, NP   1 month ago PE (physical exam), annual   [place], MD   8 months ago Essential hypertension   [place], Jeannette How, MD   12 months ago Acute non-recurrent frontal sinusitis   [place], Barbaraann Faster, NP   1 year ago [place], Barbaraann Faster, NP      Future Appointments            In 2 months Cannady, Barbaraann Faster, NP [name]

## 2019-09-28 NOTE — Telephone Encounter (Signed)
Pt states she still takes and needs refills for 90 tabs Dulaglutide (TRULICITY) 1.5 OM/8.5TC SOPN glimepiride (AMARYL) 4 MG tablet  SYNJARDY XR 12.02-999 MG [place] [address] [place] [phone] (Phone) [phone] (Fax)

## 2019-09-28 NOTE — Addendum Note (Signed)
Addended by: Georgina Peer on: 09/28/2019 08:37 AM   Modules accepted: Orders

## 2019-09-28 NOTE — Telephone Encounter (Signed)
Requested Prescriptions  Pending Prescriptions Disp Refills  . Empagliflozin-metFORMIN HCl ER (SYNJARDY XR) 12.02-999 MG TB24 60 tablet 0    Sig: Take 1 tablet by mouth 2 (two) times daily.     Endocrinology:  Diabetes - Biguanide + SGLT2 Inhibitor Combos Failed - 09/28/2019  7:53 AM      Failed - Cr in normal range and within 360 days    Creatinine, Ser  Date Value Ref Range Status  08/18/2019 1.21 (H) 0.57 - 1.00 mg/dL Final         Failed - LDL in normal range and within 360 days    LDL Chol Calc (NIH)  Date Value Ref Range Status  08/18/2019 75 0 - 99 mg/dL Final         Failed - eGFR in normal range and within 360 days    GFR calc Af Amer  Date Value Ref Range Status  08/18/2019 55 (L) >59 mL/min/1.73 Final   GFR calc non Af Amer  Date Value Ref Range Status  08/18/2019 48 (L) >59 mL/min/1.73 Final         Passed - HBA1C is between 0 and 7.9 and within 180 days    Hemoglobin A1C  Date Value Ref Range Status  06/12/2016 7.8  Final   HB A1C (BAYER DCA - WAIVED)  Date Value Ref Range Status  08/18/2019 6.4 <7.0 % Final    Comment:                                          Diabetic Adult            <7.0                                       Healthy Adult        4.3 - 5.7                                                           (DCCT/NGSP) American Diabetes Association's Summary of Glycemic Recommendations for Adults with Diabetes: Hemoglobin A1c <7.0%. More stringent glycemic goals (A1c <6.0%) may further reduce complications at the cost of increased risk of hypoglycemia.          Passed - Valid encounter within last 6 months    Recent Outpatient Visits          1 week ago Annual physical exam   [place], Barbaraann Faster, NP   1 month ago PE (physical exam), annual   [place] Cr[place], Jeannette How, MD   8 months ago Essential hypertension   [place], Jeannette How, MD   12 months ago Acute non-recurrent frontal  sinusitis   [place], Barbaraann Faster, NP   1 year ago [place], Barbaraann Faster, NP      Future Appointments            In 2 months Cannady, Barbaraann Faster, NP [name]           . glimepiride (AMARYL) 4 MG tablet  30 tablet 0     Endocrinology:  Diabetes - Sulfonylureas Passed - 09/28/2019  7:53 AM      Passed - HBA1C is between 0 and 7.9 and within 180 days    Hemoglobin A1C  Date Value Ref Range Status  06/12/2016 7.8  Final   HB A1C (BAYER DCA - WAIVED)  Date Value Ref Range Status  08/18/2019 6.4 <7.0 % Final    Comment:                                          Diabetic Adult            <7.0                                       Healthy Adult        4.3 - 5.7                                                           (DCCT/NGSP) American Diabetes Association's Summary of Glycemic Recommendations for Adults with Diabetes: Hemoglobin A1c <7.0%. More stringent glycemic goals (A1c <6.0%) may further reduce complications at the cost of increased risk of hypoglycemia.          Passed - Valid encounter within last 6 months    Recent Outpatient Visits          1 week ago Annual physical exam   [place], Barbaraann Faster, NP   1 month ago PE (physical exam), annual   [place] Criss[place], Jeannette How, MD   8 months ago Essential hypertension   [place], Jeannette How, MD   12 months ago Acute non-recurrent frontal sinusitis   [place], Barbaraann Faster, NP   1 year ago [place], Barbaraann Faster, NP      Future Appointments            In 2 months Cannady, Barbaraann Faster, NP [name]           . Dulaglutide (TRULICITY) 1.5 RW/4.3XV SOPN 2 pen 12    Sig: INJECT 1.5 MG  SUBCUTANEOUSLY ONCE A WEEK     Endocrinology:  Diabetes - GLP-1 Receptor Agonists Passed - 09/28/2019  7:53 AM      Passed - HBA1C is  between 0 and 7.9 and within 180 days    Hemoglobin A1C  Date Value Ref Range Status  06/12/2016 7.8  Final   HB A1C (BAYER DCA - WAIVED)  Date Value Ref Range Status  08/18/2019 6.4 <7.0 % Final    Comment:                                          Diabetic Adult            <7.0  Healthy Adult        4.3 - 5.7                                                           (DCCT/NGSP) American Diabetes Association's Summary of Glycemic Recommendations for Adults with Diabetes: Hemoglobin A1c <7.0%. More stringent glycemic goals (A1c <6.0%) may further reduce complications at the cost of increased risk of hypoglycemia.          Passed - Valid encounter within last 6 months    Recent Outpatient Visits          1 week ago Annual physical exam   [place], Barbaraann Faster, NP   1 month ago PE (physical exam), annual   [place], MD   8 months ago Essential hypertension   [place], Jeannette How, MD   12 months ago Acute non-recurrent frontal sinusitis   [place], Barbaraann Faster, NP   1 year ago [place], Barbaraann Faster, NP      Future Appointments            In 2 months Cannady, Barbaraann Faster, NP [name]

## 2019-10-31 ENCOUNTER — Other Ambulatory Visit: Payer: Self-pay | Admitting: Nurse Practitioner

## 2019-10-31 DIAGNOSIS — E119 Type 2 diabetes mellitus without complications: Secondary | ICD-10-CM

## 2019-11-01 ENCOUNTER — Other Ambulatory Visit: Payer: Self-pay

## 2019-11-01 MED ORDER — MELOXICAM 15 MG PO TABS: 15.0000 mg | ORAL_TABLET | Freq: Every day | ORAL | 0 refills | Status: DC

## 2019-11-01 NOTE — Telephone Encounter (Signed)
Patient last seen 09/21/19 and has an appointment 11/30/19

## 2019-11-12 ENCOUNTER — Telehealth: Payer: Self-pay

## 2019-11-12 MED ORDER — SYNJARDY XR 12.5-1000 MG PO TB24: 1.0000 | ORAL_TABLET | Freq: Two times a day (BID) | ORAL | 3 refills | Status: DC

## 2019-11-12 NOTE — Telephone Encounter (Signed)
PA Approved

## 2019-11-12 NOTE — Telephone Encounter (Signed)
I am working on PA today

## 2019-11-12 NOTE — Telephone Encounter (Signed)
Prior Authorization initiated via CoverMyMeds for Synjardy XR 12.5-1000MG  er tablets Key: [id number]

## 2019-11-12 NOTE — Telephone Encounter (Signed)
Patient called to check the status of her refill and to give the ID for her new insurance with [facility] ID# [id number]

## 2019-11-12 NOTE — Telephone Encounter (Signed)
Wrong form with the first key.  PA sent with new Key: [id number]

## 2019-11-12 NOTE — Addendum Note (Signed)
Addended by: Marshall Cork on: 11/12/2019 09:10 AM   Modules accepted: Orders

## 2019-11-12 NOTE — Telephone Encounter (Signed)
Pt states that pharmacy will not give her medication states that PCP needs to do some sort of prior auth so insurance will allow them to dispense.

## 2019-11-12 NOTE — Telephone Encounter (Signed)
Insurance added

## 2019-11-12 NOTE — Telephone Encounter (Signed)
Patient notified and verbalized understanding. 

## 2019-11-30 ENCOUNTER — Ambulatory Visit (INDEPENDENT_AMBULATORY_CARE_PROVIDER_SITE_OTHER): Payer: 59 | Admitting: Nurse Practitioner

## 2019-11-30 ENCOUNTER — Encounter: Payer: Self-pay | Admitting: Nurse Practitioner

## 2019-11-30 ENCOUNTER — Other Ambulatory Visit: Payer: Self-pay

## 2019-11-30 VITALS — BP 123/75 | HR 76 | Temp 97.7°F

## 2019-11-30 DIAGNOSIS — E1169 Type 2 diabetes mellitus with other specified complication: Secondary | ICD-10-CM | POA: Diagnosis not present

## 2019-11-30 DIAGNOSIS — E1159 Type 2 diabetes mellitus with other circulatory complications: Secondary | ICD-10-CM

## 2019-11-30 DIAGNOSIS — I10 Essential (primary) hypertension: Secondary | ICD-10-CM

## 2019-11-30 DIAGNOSIS — E1122 Type 2 diabetes mellitus with diabetic chronic kidney disease: Secondary | ICD-10-CM | POA: Diagnosis not present

## 2019-11-30 DIAGNOSIS — K219 Gastro-esophageal reflux disease without esophagitis: Secondary | ICD-10-CM | POA: Insufficient documentation

## 2019-11-30 DIAGNOSIS — E1121 Type 2 diabetes mellitus with diabetic nephropathy: Secondary | ICD-10-CM | POA: Diagnosis not present

## 2019-11-30 DIAGNOSIS — F5101 Primary insomnia: Secondary | ICD-10-CM

## 2019-11-30 DIAGNOSIS — E785 Hyperlipidemia, unspecified: Secondary | ICD-10-CM

## 2019-11-30 DIAGNOSIS — N183 Chronic kidney disease, stage 3 unspecified: Secondary | ICD-10-CM

## 2019-11-30 DIAGNOSIS — I152 Hypertension secondary to endocrine disorders: Secondary | ICD-10-CM

## 2019-11-30 DIAGNOSIS — G4700 Insomnia, unspecified: Secondary | ICD-10-CM | POA: Insufficient documentation

## 2019-11-30 LAB — MICROALBUMIN, URINE WAIVED
Creatinine, Urine Waived: 200 mg/dL (ref 10–300)
Microalb, Ur Waived: 30 mg/L — ABNORMAL HIGH (ref 0–19)
Microalb/Creat Ratio: <30 mg/g (ref <30)

## 2019-11-30 LAB — BAYER DCA HB A1C WAIVED: HB A1C (BAYER DCA - WAIVED): 6.9 % (ref <7.0)

## 2019-11-30 MED ORDER — GLIMEPIRIDE 4 MG PO TABS: 4.0000 mg | ORAL_TABLET | Freq: Every day | ORAL | 12 refills | Status: DC

## 2019-11-30 NOTE — Assessment & Plan Note (Signed)
Chronic, ongoing with BP below goal.  Continue current medication regimen and adjust as needed.  Lotensin for kidney protection.  CMP today.  Return in 3 months.

## 2019-11-30 NOTE — Progress Notes (Signed)
BP 123/75 (BP Location: Left Arm, Patient Position: Sitting, Cuff Size: Normal)   Pulse 76   Temp 97.7 F (36.5 C) (Oral)   SpO2 99%    Subjective:    Patient ID: Amanda [name], female    DOB: 13-Dec-1955, 64 y.o.   MRN: [id number]  HPI: Amanda [name] is a 64 y.o. female  Chief Complaint  Patient presents with  . Diabetes  . Hypertension  . Hyperlipidemia  . Chronic Kidney Disease   DIABETES Last A1C in October 6.4%.  Continues on [place], Glimepiride, Onglyza, Trulicity.  Hypoglycemic episodes:no Polydipsia/polyuria: no Visual disturbance: no Chest pain: no Paresthesias: no Glucose Monitoring: no             Accucheck frequency: rarely             Fasting glucose: 80 -100             Post prandial:             Evening:             Before meals: Taking Insulin?: no             Long acting insulin:             Short acting insulin: Blood Pressure Monitoring: rarely Retinal Examination: Not Up To Date Foot Exam: Up to Date Pneumovax: Up to Date Influenza: Up to Date Aspirin: yes   HYPERTENSION / HYPERLIPIDEMIA Continues on HCTZ, Carvedilol, Benazepril, and Lovastatin + ASA. Satisfied with current treatment? yes Duration of hypertension: chronic BP monitoring frequency: rarely BP range: 120/70's BP medication side effects: no Duration of hyperlipidemia: chronic Cholesterol medication side effects: no Cholesterol supplements: none Medication compliance: good compliance Aspirin: yes Recent stressors: no Recurrent headaches: no Visual changes: no Palpitations: no Dyspnea: no Chest pain: no Lower extremity edema: no Dizzy/lightheaded: no   CHRONIC KIDNEY DISEASE October labs showed GFR 48 and CRT 1.21. CKD status: stable Medications renally dose: yes Previous renal evaluation: no Pneumovax:  Up to Date Influenza Vaccine:  Up to Date  INSOMNIA Continues on Trazodone 50 MG with good benefit. Duration: chronic Satisfied with sleep quality:  yes Difficulty falling asleep: yes Difficulty staying asleep: no Waking a few hours after sleep onset: no Early morning awakenings: no Daytime hypersomnolence: no Wakes feeling refreshed: yes Good sleep hygiene: yes Apnea: no Snoring: no Depressed/anxious mood: no Recent stress: no Restless legs/nocturnal leg cramps: no Chronic pain/arthritis: yes History of sleep study: no Treatments attempted: none   Relevant past medical, surgical, family and social history reviewed and updated as indicated. Interim medical history since our last visit reviewed. Allergies and medications reviewed and updated.  Review of Systems  Constitutional: Negative for activity change, appetite change, diaphoresis, fatigue and fever.  Respiratory: Negative for cough, chest tightness and shortness of breath.   Cardiovascular: Negative for chest pain, palpitations and leg swelling.  Gastrointestinal: Negative.   Endocrine: Negative for cold intolerance, heat intolerance, polydipsia, polyphagia and polyuria.  Neurological: Negative.   Psychiatric/Behavioral: Negative.     Per HPI unless specifically indicated above     Objective:    BP 123/75 (BP Location: Left Arm, Patient Position: Sitting, Cuff Size: Normal)   Pulse 76   Temp 97.7 F (36.5 C) (Oral)   SpO2 99%   Wt Readings from Last 3 Encounters:  09/21/19 189 lb 6.4 oz (85.9 kg)  09/30/18 185 lb (83.9 kg)  09/15/18 187 lb 2 oz (84.9 kg)    Physical  Exam Vitals and nursing note reviewed.  Constitutional:      General: She is awake. She is not in acute distress.    Appearance: She is well-developed. She is obese. She is not ill-appearing.  HENT:     Head: Normocephalic.     Right Ear: Hearing normal.     Left Ear: Hearing normal.     Nose: Nose normal.     Mouth/Throat:     Mouth: Mucous membranes are moist.  Eyes:     General: Lids are normal.        Right eye: No discharge.        Left eye: No discharge.     Conjunctiva/sclera:  Conjunctivae normal.     Pupils: Pupils are equal, round, and reactive to light.  Neck:     Thyroid: No thyromegaly.     Vascular: No carotid bruit or JVD.  Cardiovascular:     Rate and Rhythm: Normal rate and regular rhythm.     Heart sounds: Normal heart sounds. No murmur. No gallop.   Pulmonary:     Effort: Pulmonary effort is normal.     Breath sounds: Normal breath sounds.  Abdominal:     General: Bowel sounds are normal.     Palpations: Abdomen is soft. There is no hepatomegaly or splenomegaly.  Musculoskeletal:     Cervical back: Normal range of motion and neck supple.     Right lower leg: No edema.     Left lower leg: No edema.  Lymphadenopathy:     Cervical: No cervical adenopathy.  Skin:    General: Skin is warm and dry.  Neurological:     Mental Status: She is alert and oriented to person, place, and time.  Psychiatric:        Attention and Perception: Attention normal.        Mood and Affect: Mood normal.        Behavior: Behavior normal. Behavior is cooperative.        Thought Content: Thought content normal.        Judgment: Judgment normal.     Results for orders placed or performed in visit on 11/30/19  Bayer DCA Hb A1c Waived  Result Value Ref Range   HB A1C (BAYER DCA - WAIVED) 6.9 <7.0 %  Microalbumin, Urine Waived  Result Value Ref Range   Microalb, Ur Waived 30 (H) 0 - 19 mg/L   Creatinine, Urine Waived 200 10 - 300 mg/dL   Microalb/Creat Ratio <30 <30 mg/g      Assessment & Plan:   Problem List Items Addressed This Visit      Cardiovascular and Mediastinum   Hypertension associated with diabetes ([place])    Chronic, ongoing with BP below goal.  Continue current medication regimen and adjust as needed.  Lotensin for kidney protection.  CMP today.  Return in 3 months.      Relevant Medications   glimepiride (AMARYL) 4 MG tablet   Other Relevant Orders   Bayer DCA Hb A1c Waived (Completed)   Basic Metabolic Panel (BMET)     Endocrine   CKD  stage 3 due to type 2 diabetes mellitus (HCC)    Chronic, stable with BP at goal and A1C below goal.  Continue current medication regimen and adjust as needed, Lotensin for kidney protection.  Urine ALB 30 and A:C <30, CMP today.  Return in 3 months.      Relevant Medications   glimepiride (AMARYL) 4 MG tablet  Other Relevant Orders   Microalbumin, Urine Waived (Completed)   Basic Metabolic Panel (BMET)   Hyperlipidemia associated with type 2 diabetes mellitus (HCC)    Chronic, ongoing.  Continue current medication regimen and adjust as needed.  Lipid panel today and CMP.      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  Continue current medication regimen at this time. Goal is to discontinue Glimepiride and in long run would benefit from d/c of Onglyza and increase in Trulicity if needed (due to medications having similar mechanism).  Discussed this plan at length with her.   Continue to monitor BS at home daily. Plan to see her back in 3 months for follow-up.       Relevant Medications   glimepiride (AMARYL) 4 MG tablet   Other Relevant Orders   Bayer DCA Hb A1c Waived (Completed)     Other   Insomnia    Chronic, stable.  Continue Trazodone and adjust dose as needed.  May consider sleep study in future.  Return in 3 months.      Morbid obesity (HCC)    Ongoing with BMI 36.14 and T2DM.  Recommend continued focus on healthy diet choices and regular physical activity (30 minutes 5 days a week).       Relevant Medications   glimepiride (AMARYL) 4 MG tablet       Follow up plan: Return in about 3 months (around 02/27/2020) for T2DM, HTN/HLD.

## 2019-11-30 NOTE — Assessment & Plan Note (Signed)
Ongoing with BMI 36.14 and T2DM.  Recommend continued focus on healthy diet choices and regular physical activity (30 minutes 5 days a week).

## 2019-11-30 NOTE — Assessment & Plan Note (Signed)
Chronic, ongoing.  Continue current medication regimen and adjust as needed.  Lipid panel today and CMP. 

## 2019-11-30 NOTE — Patient Instructions (Signed)
Carbohydrate Counting for Diabetes Mellitus, Adult  Carbohydrate counting is a method of keeping track of how many carbohydrates you eat. Eating carbohydrates naturally increases the amount of sugar (glucose) in the blood. Counting how many carbohydrates you eat helps keep your blood glucose within normal limits, which helps you manage your diabetes (diabetes mellitus). It is important to know how many carbohydrates you can safely have in each meal. This is different for every person. A diet and nutrition specialist (registered dietitian) can help you make a meal plan and calculate how many carbohydrates you should have at each meal and snack. Carbohydrates are found in the following foods:  Grains, such as breads and cereals.  Dried beans and soy products.  Starchy vegetables, such as potatoes, peas, and corn.  Fruit and fruit juices.  Milk and yogurt.  Sweets and snack foods, such as cake, cookies, candy, chips, and soft drinks. How do I count carbohydrates? There are two ways to count carbohydrates in food. You can use either of the methods or a combination of both. Reading "Nutrition Facts" on packaged food The "Nutrition Facts" list is included on the labels of almost all packaged foods and beverages in the [place] It includes:  The serving size.  Information about nutrients in each serving, including the grams (g) of carbohydrate per serving. To use the "Nutrition Facts":  Decide how many servings you will have.  Multiply the number of servings by the number of carbohydrates per serving.  The resulting number is the total amount of carbohydrates that you will be having. Learning standard serving sizes of other foods When you eat carbohydrate foods that are not packaged or do not include "Nutrition Facts" on the label, you need to measure the servings in order to count the amount of carbohydrates:  Measure the foods that you will eat with a food scale or measuring cup, if  needed.  Decide how many standard-size servings you will eat.  Multiply the number of servings by 15. Most carbohydrate-rich foods have about 15 g of carbohydrates per serving. ? For example, if you eat 8 oz (170 g) of strawberries, you will have eaten 2 servings and 30 g of carbohydrates (2 servings x 15 g = 30 g).  For foods that have more than one food mixed, such as soups and casseroles, you must count the carbohydrates in each food that is included. The following list contains standard serving sizes of common carbohydrate-rich foods. Each of these servings has about 15 g of carbohydrates:   hamburger bun or  English muffin.   oz (15 mL) syrup.   oz (14 g) jelly.  1 slice of bread.  1 six-inch tortilla.  3 oz (85 g) cooked rice or pasta.  4 oz (113 g) cooked dried beans.  4 oz (113 g) starchy vegetable, such as peas, corn, or potatoes.  4 oz (113 g) hot cereal.  4 oz (113 g) mashed potatoes or  of a large baked potato.  4 oz (113 g) canned or frozen fruit.  4 oz (120 mL) fruit juice.  4-6 crackers.  6 chicken nuggets.  6 oz (170 g) unsweetened dry cereal.  6 oz (170 g) plain fat-free yogurt or yogurt sweetened with artificial sweeteners.  8 oz (240 mL) milk.  8 oz (170 g) fresh fruit or one small piece of fruit.  24 oz (680 g) popped popcorn. Example of carbohydrate counting Sample meal  3 oz (85 g) chicken breast.  6 oz (170 g)   brown rice.  4 oz (113 g) corn.  8 oz (240 mL) milk.  8 oz (170 g) strawberries with sugar-free whipped topping. Carbohydrate calculation 1. Identify the foods that contain carbohydrates: ? Rice. ? Corn. ? Milk. ? Strawberries. 2. Calculate how many servings you have of each food: ? 2 servings rice. ? 1 serving corn. ? 1 serving milk. ? 1 serving strawberries. 3. Multiply each number of servings by 15 g: ? 2 servings rice x 15 g = 30 g. ? 1 serving corn x 15 g = 15 g. ? 1 serving milk x 15 g = 15 g. ? 1  serving strawberries x 15 g = 15 g. 4. Add together all of the amounts to find the total grams of carbohydrates eaten: ? 30 g + 15 g + 15 g + 15 g = 75 g of carbohydrates total. Summary  Carbohydrate counting is a method of keeping track of how many carbohydrates you eat.  Eating carbohydrates naturally increases the amount of sugar (glucose) in the blood.  Counting how many carbohydrates you eat helps keep your blood glucose within normal limits, which helps you manage your diabetes.  A diet and nutrition specialist (registered dietitian) can help you make a meal plan and calculate how many carbohydrates you should have at each meal and snack. This information is not intended to replace advice given to you by your health care provider. Make sure you discuss any questions you have with your health care provider. Document Revised: 05/01/2017 Document Reviewed: 03/20/2016 Elsevier Patient Education  2020 Elsevier Inc.  

## 2019-11-30 NOTE — Assessment & Plan Note (Signed)
Chronic, stable with BP at goal and A1C below goal.  Continue current medication regimen and adjust as needed, Lotensin for kidney protection.  Urine ALB 30 and A:C <30, CMP today.  Return in 3 months.

## 2019-11-30 NOTE — Assessment & Plan Note (Signed)
Chronic, ongoing with A1C 6.9% and urine ALB 30 and A:C <30.  Continue current medication regimen at this time. Goal is to discontinue Glimepiride and in long run would benefit from d/c of Onglyza and increase in Trulicity if needed (due to medications having similar mechanism).  Discussed this plan at length with her.   Continue to monitor BS at home daily. Plan to see her back in 3 months for follow-up.

## 2019-11-30 NOTE — Assessment & Plan Note (Signed)
Chronic, stable.  Continue Trazodone and adjust dose as needed.  May consider sleep study in future.  Return in 3 months. 

## 2019-12-01 LAB — BASIC METABOLIC PANEL
BUN/Creatinine Ratio: 21 (ref 12–28)
BUN: 23 mg/dL (ref 8–27)
CO2: 23 mmol/L (ref 20–29)
Calcium: 9.2 mg/dL (ref 8.7–10.3)
Chloride: 103 mmol/L (ref 96–106)
Creatinine, Ser: 1.12 mg/dL — ABNORMAL HIGH (ref 0.57–1.00)
GFR calc Af Amer: 60 mL/min/{1.73_m2} (ref >59)
GFR calc non Af Amer: 52 mL/min/{1.73_m2} — ABNORMAL LOW (ref >59)
Glucose: 144 mg/dL — ABNORMAL HIGH (ref 65–99)
Potassium: 3.9 mmol/L (ref 3.5–5.2)
Sodium: 142 mmol/L (ref 134–144)

## 2019-12-01 LAB — LIPID PANEL W/O CHOL/HDL RATIO
Cholesterol, Total: 155 mg/dL (ref 100–199)
HDL: 51 mg/dL (ref >39)
LDL Chol Calc (NIH): 73 mg/dL (ref 0–99)
Triglycerides: 184 mg/dL — ABNORMAL HIGH (ref 0–149)
VLDL Cholesterol Cal: 31 mg/dL (ref 5–40)

## 2019-12-01 NOTE — Progress Notes (Signed)
Please let Amanda [name] know her kidney function is staying at baseline with no decline.  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  Cholesterol levels remain in good range.  Great job!!  Continue all medications.

## 2019-12-10 ENCOUNTER — Other Ambulatory Visit: Payer: Self-pay | Admitting: Nurse Practitioner

## 2019-12-30 ENCOUNTER — Other Ambulatory Visit: Payer: Self-pay | Admitting: Nurse Practitioner

## 2019-12-30 DIAGNOSIS — E119 Type 2 diabetes mellitus without complications: Secondary | ICD-10-CM

## 2020-01-13 ENCOUNTER — Telehealth: Payer: Self-pay

## 2020-01-13 NOTE — Telephone Encounter (Signed)
Prior Authorization initiated via CoverMyMeds for Onglyza 5MG  tablets Key: 

## 2020-01-13 NOTE — Telephone Encounter (Signed)
Called patient, no answer, unable to leave [name] message, will try again.   Copied from CRM [id number]. Topic: General - Other >> Jan 13, 2020  1:24 PM Amanda [name] wrote: Patient would like [name] callback in regards to [name] status update on the prior authorization for her ONGLYZA 5 MG TABS tablet medication.

## 2020-01-17 ENCOUNTER — Other Ambulatory Visit: Payer: Self-pay | Admitting: Nurse Practitioner

## 2020-01-17 ENCOUNTER — Telehealth: Payer: Self-pay | Admitting: Family Medicine

## 2020-01-17 DIAGNOSIS — E1121 Type 2 diabetes mellitus with diabetic nephropathy: Secondary | ICD-10-CM

## 2020-01-17 NOTE — Telephone Encounter (Signed)
Spoke with pharmacy. Medication is still $500 with prior authorization approval.   Patient cannot afford it. Routing to provider to advise.

## 2020-01-17 NOTE — Telephone Encounter (Signed)
Pt returned Amanda [name] all/ Pt also stated that the Cost of the Onglyza is to expensive and she can not afford it. / please advise/ Pt also is at work and can not be on the phones/ she will be available during her next break at 12:30

## 2020-01-17 NOTE — Telephone Encounter (Addendum)
PA approved.   This note is not being shared with the patient for the following reason: Unable to addend previous message from [facility].

## 2020-01-17 NOTE — Progress Notes (Signed)
CCM referral due to cost of diabetes medications

## 2020-01-17 NOTE — Telephone Encounter (Signed)
Please let her know I am placing a referral to our chronic care management PharmD for further guidance on costs.  Will insurance cover any of the others in this category? Like Januvia?

## 2020-01-17 NOTE — Chronic Care Management (AMB) (Signed)
  Care Management   Note  01/17/2020 Name: Amanda [name] MRN: [id number] DOB: 23-Jan-1956  [name] is a 64 y.o. year old female who is a primary care patient of Crissman, Redge Gainer, MD. I reached out to [employer] by phone today in response to a referral sent by [name]'s health plan.    [name] was given information about care management services today including:  1. Care management services include personalized support from designated clinical staff supervised by her physician, including individualized plan of care and coordination with other care providers 2. 24/7 contact phone numbers for assistance for urgent and routine care needs. 3. The patient may stop care management services at any time by phone call to the office staff.  Patient agreed to services and verbal consent obtained.   Follow up plan: Telephone appointment with care management team member scheduled for:02/25/2020  Elisha Ponder, LPN Health Advisor, Embedded Care Coordination [facility] Health Care Management ??[email or web address] ??[phone]

## 2020-01-24 ENCOUNTER — Other Ambulatory Visit: Payer: Self-pay | Admitting: Nurse Practitioner

## 2020-01-24 MED ORDER — SITAGLIPTIN PHOSPHATE 25 MG PO TABS: 25.0000 mg | ORAL_TABLET | Freq: Every day | ORAL | 12 refills | Status: DC

## 2020-01-24 NOTE — Telephone Encounter (Signed)
According the plan site, these are all Tier 3 medications (ranging from 1-6). There's no medication in the category lower than tier 3. May have to do another PA, but it won't be as expensive as Onglyza.   NVOKANA ORAL TABLET 100 MG 3 MO; QL (90 EA per 30 days) INVOKANA ORAL TABLET 300 MG 3 MO; QL (30 EA per 30 days) JANUMET ORAL TABLET 50-1000 MG, 50-500 MG 3 MO; QL (60 EA per 30 days) JANUMET XR ORAL TABLET EXTENDED RELEASE 24 HOUR [phone] MG, 50-1000 MG, 50-500 MG 3 MO; QL (60 EA per 30 days) JANUVIA ORAL TABLET 100 MG, 25 MG, 50 MG 3 MO; QL (30 EA per 30 days) JARDIANCE ORAL TABLET 10 MG, 25 MG 3 MO; QL (30 EA per 30 days)

## 2020-01-24 NOTE — Telephone Encounter (Signed)
Have sent in Januvia 25 MG for 30 day supply.  Will talk to her next visit and if A1C <7 will consider discontinuing it and keeping Trulicity as similar action present

## 2020-01-27 NOTE — Telephone Encounter (Signed)
I've tried calling patient but no answer. Unable to LVM.

## 2020-02-03 NOTE — Telephone Encounter (Signed)
Have tried calling patient multiple times. Unable to leave VM.

## 2020-02-25 ENCOUNTER — Ambulatory Visit: Payer: 59 | Admitting: Pharmacist

## 2020-02-25 DIAGNOSIS — I152 Hypertension secondary to endocrine disorders: Secondary | ICD-10-CM

## 2020-02-25 DIAGNOSIS — E785 Hyperlipidemia, unspecified: Secondary | ICD-10-CM

## 2020-02-25 DIAGNOSIS — E1121 Type 2 diabetes mellitus with diabetic nephropathy: Secondary | ICD-10-CM

## 2020-02-25 DIAGNOSIS — E1169 Type 2 diabetes mellitus with other specified complication: Secondary | ICD-10-CM

## 2020-02-25 DIAGNOSIS — I10 Essential (primary) hypertension: Secondary | ICD-10-CM

## 2020-02-25 NOTE — Patient Instructions (Signed)
Visit Information  Goals Addressed            This Visit's Progress     Patient Stated   . PharmD "I can't afford this medication" (pt-stated)       CARE PLAN ENTRY (see longtitudinal plan of care for additional care plan information)  Current Barriers:  . 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 Most recent eGFR: ~52 mL/min . Current antihyperglycemic regimen: Synjardy 02.12/3433 mg BID, Trulicity 1.5 mg weekly, glimepiride 4 mg daily, prescribed Januvia 25 mg daily, but not taking d/t cost . Endorses occasional daytime hypoglycemic symptoms, including sweating. Concerning, given her job as a [employer] . Current blood glucose readings: not checking currently  . Cardiovascular risk reduction: o Current hypertensive regimen: carvedilol 25 mg BID, HCTZ 25 mg daily, benazepril 40 mg daily; last clinic BP at goal <130/80 o Current hyperlipidemia regimen: lovastatin 40 mg (2 20 mg tab daily); last LDL NOT at goal <70 o Current antiplatelet regimen: ASA 81 mg daily  Pharmacist Clinical Goal(s):  Marland Kitchen Over the next 90 days, patient will work with PharmD and primary care provider to address optimized medication management  Interventions: . Comprehensive medication review performed, medication list updated in electronic medical record . Inter-disciplinary care team collaboration (see longitudinal plan of care) . Discussed duplicative action of DPP4 and GLP1. Recommend d/c DPP4 d/t lack of benefit in combination w/ GLP1. Patient agreeable to this . Upcoming appointment w/ PCP next week. Recommend d/c glimepiride. Pending A1c, can increase Trulicity to target W8S <7%.  . Discussed importance of SMBG. Reviewed goal A1c, goal fasting glucose, and goal 2 hour post prandial glucose.  . Reviewed manufacturer  coupon cards. Patient will download cards to reduce Synjardy cost to $16/OHFGB and Trulicity to $02/XJDBZ . Encouraged patient to check after these upcoming medication changes to evaluate control on new regimen . Given LDL not at goal, recommend d/c lovastatin and increase to high intensity statin (also to reduce pill burden)  Patient Self Care Activities:  . Patient will check blood glucose daily, document, and provide at future appointments . Patient will take medications as prescribed . Patient will report any questions or concerns to provider   Initial goal documentation        Patient verbalizes understanding of instructions provided today.    Plan: - Scheduled f/u call in ~6 weeks  Catie Darnelle Maffucci, PharmD, [place] [phone]

## 2020-02-25 NOTE — Chronic Care Management (AMB) (Signed)
Chronic Care Management   Note  02/25/2020 Name: Amanda [name] MRN: [id number] DOB: 1956-09-15   Subjective:  Amanda [name] is a 64 y.o. year old female who is a primary care patient of Crissman, Jeannette How, MD. The CCM team was consulted for assistance with chronic disease management and care coordination needs.    Contacted patient for medication management review.   Review of patient status, including review of consultants reports, laboratory and other test data, was performed as part of comprehensive evaluation and provision of chronic care management services.   SDOH (Social Determinants of Health) assessments and interventions performed:  yes  Objective:  Lab Results  Component Value Date   CREATININE 1.12 (H) 11/30/2019   CREATININE 1.21 (H) 08/18/2019   CREATININE 1.18 (H) 07/22/2018    Lab Results  Component Value Date   HGBA1C 6.9 11/30/2019       Component Value Date/Time   CHOL 155 11/30/2019 1503   CHOL 196 07/16/2017 1422   TRIG 184 (H) 11/30/2019 1503   TRIG 301 (H) 07/16/2017 1422   HDL 51 11/30/2019 1503   CHOLHDL 3.1 08/18/2019 1622   VLDL 60 (H) 07/16/2017 1422   LDLCALC 73 11/30/2019 1503    Clinical ASCVD: No  The 10-year ASCVD risk score Mikey Bussing DC Jr., et al., 2013) is: 10.5%   Values used to calculate the score:     Age: 16 years     Sex: Female     Is Non-Hispanic African American: No     Diabetic: Yes     Tobacco smoker: No     Systolic Blood Pressure: 425 mmHg     Is BP treated: Yes     HDL Cholesterol: 51 mg/dL     Total Cholesterol: 155 mg/dL    BP Readings from Last 3 Encounters:  11/30/19 123/75  09/21/19 109/69  09/30/18 108/62    Allergies  Allergen Reactions  . Penicillins   . Tramadol Other (See Comments)    "woozy"    Medications Reviewed Today    Reviewed by De Hollingshead, [facility] (Pharmacist) on 02/25/20 at 1320  Med List Status: <None>  Medication Order Taking? Sig Documenting Provider Last Dose Status Informant    aspirin (ASPIRIN EC) 81 MG EC tablet [id number] Yes Take 81 mg by mouth daily. Swallow whole. [provider] Taking Active   benazepril (LOTENSIN) 40 MG tablet [id number] Yes Take 1 tablet (40 mg total) by mouth daily. Marnee Guarneri T, NP Taking Active   Calcium Carb-Cholecalciferol (CALCIUM 600+D3) 600-200 MG-UNIT TABS [id number] Yes Take 1 tablet by mouth daily. [provider] Taking Active   carvedilol (COREG) 25 MG tablet [id number] Yes Take 1 tablet (25 mg total) by mouth 2 (two) times daily with a meal. Cannady, [name], NP Taking Active   Dulaglutide (TRULICITY) 1.5 DD/2.2GU SOPN [id number] Yes INJECT 1.5 MG  SUBCUTANEOUSLY ONCE A WEEK Cannady, [name], NP Taking Active   Empagliflozin-metFORMIN HCl ER (SYNJARDY XR) 12.02-999 MG TB24 [id number] Yes Take 1 tablet by mouth 2 (two) times daily. Marnee Guarneri T, NP Taking Active   glimepiride (AMARYL) 4 MG tablet [id number] Yes Take 1 tablet (4 mg total) by mouth daily with breakfast. Marnee Guarneri T, NP Taking Active   hydrochlorothiazide (HYDRODIURIL) 25 MG tablet [id number] Yes Take 1 tablet (25 mg total) by mouth daily. Marnee Guarneri T, NP Taking Active   lovastatin (MEVACOR) 20 MG tablet [id number] Yes Take 2 tablets (40 mg total) by mouth at bedtime.  Marnee Guarneri T, NP Taking Active   meloxicam (MOBIC) 15 MG tablet [id number] Yes Take 1 tablet by mouth once daily Cannady, [name], NP Taking Active   Multiple Vitamin (MULTIVITAMIN) tablet [id number] Yes Take 1 tablet by mouth daily. [provider] Taking Active   omeprazole (PRILOSEC) 20 MG capsule [id number] Yes Take 20 mg by mouth daily. [provider] Taking Active   sitaGLIPtin (JANUVIA) 25 MG tablet [id number] No Take 1 tablet (25 mg total) by mouth daily.  Patient not taking: Reported on 02/25/2020   Marnee Guarneri T, NP Not Taking Active   traZODone (DESYREL) 50 MG tablet [id number] Yes Take 1 tablet (50 mg total) by mouth at bedtime. Venita Lick, NP Taking Active            Assessment:   Goals Addressed            This Visit's Progress     Patient Stated   . PharmD "I can't afford this medication" (pt-stated)       CARE PLAN ENTRY (see longtitudinal plan of care for additional care plan information)  Current Barriers:  . 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 Most recent eGFR: ~52 mL/min . Current antihyperglycemic regimen: Synjardy 65.03/8126 mg BID, Trulicity 1.5 mg weekly, glimepiride 4 mg daily, prescribed Januvia 25 mg daily, but not taking d/t cost . Endorses occasional daytime hypoglycemic symptoms, including sweating. Concerning, given her job as a [employer] . Current blood glucose readings: not checking currently  . Cardiovascular risk reduction: o Current hypertensive regimen: carvedilol 25 mg BID, HCTZ 25 mg daily, benazepril 40 mg daily; last clinic BP at goal <130/80 o Current hyperlipidemia regimen: lovastatin 40 mg (2 20 mg tab daily); last LDL NOT at goal <70 o Current antiplatelet regimen: ASA 81 mg daily  Pharmacist Clinical Goal(s):  Marland Kitchen Over the next 90 days, patient will work with PharmD and primary care provider to address optimized medication management  Interventions: . Comprehensive medication review performed, medication list updated in electronic medical record . Inter-disciplinary care team collaboration (see longitudinal plan of care) . Discussed duplicative action of DPP4 and GLP1. Recommend d/c DPP4 d/t lack of benefit in combination w/ GLP1. Patient agreeable to this . Upcoming appointment w/ PCP next week. Recommend d/c glimepiride. Pending A1c, can increase Trulicity to target N1Z <7%.  . Discussed importance of SMBG. Reviewed goal A1c, goal fasting glucose, and goal 2 hour post  prandial glucose.  . Reviewed manufacturer coupon cards. Patient will download cards to reduce Synjardy cost to $00/FVCBS and Trulicity to $49/QPRFF . Encouraged patient to check after these upcoming medication changes to evaluate control on new regimen . Given LDL not at goal, recommend d/c lovastatin and increase to high intensity statin (also to reduce pill burden)  Patient Self Care Activities:  . Patient will check blood glucose daily, document, and provide at future appointments . Patient will take medications as prescribed . Patient will report any questions or concerns to provider   Initial goal documentation        Plan: - Scheduled f/u call in ~6 weeks  Catie Darnelle Maffucci, PharmD, [place] [phone]

## 2020-03-01 ENCOUNTER — Ambulatory Visit: Payer: 59 | Admitting: Nurse Practitioner

## 2020-03-01 ENCOUNTER — Encounter: Payer: Self-pay | Admitting: Nurse Practitioner

## 2020-03-01 ENCOUNTER — Other Ambulatory Visit: Payer: Self-pay

## 2020-03-01 VITALS — BP 114/72 | HR 65 | Temp 98.1°F | Wt 189.8 lb

## 2020-03-01 DIAGNOSIS — E1169 Type 2 diabetes mellitus with other specified complication: Secondary | ICD-10-CM

## 2020-03-01 DIAGNOSIS — E1121 Type 2 diabetes mellitus with diabetic nephropathy: Secondary | ICD-10-CM | POA: Diagnosis not present

## 2020-03-01 DIAGNOSIS — F5101 Primary insomnia: Secondary | ICD-10-CM

## 2020-03-01 DIAGNOSIS — N183 Chronic kidney disease, stage 3 unspecified: Secondary | ICD-10-CM

## 2020-03-01 DIAGNOSIS — I10 Essential (primary) hypertension: Secondary | ICD-10-CM

## 2020-03-01 DIAGNOSIS — E1159 Type 2 diabetes mellitus with other circulatory complications: Secondary | ICD-10-CM

## 2020-03-01 DIAGNOSIS — I152 Hypertension secondary to endocrine disorders: Secondary | ICD-10-CM

## 2020-03-01 DIAGNOSIS — Z6836 Body mass index (BMI) 36.0-36.9, adult: Secondary | ICD-10-CM

## 2020-03-01 DIAGNOSIS — E669 Obesity, unspecified: Secondary | ICD-10-CM | POA: Insufficient documentation

## 2020-03-01 DIAGNOSIS — E1122 Type 2 diabetes mellitus with diabetic chronic kidney disease: Secondary | ICD-10-CM

## 2020-03-01 DIAGNOSIS — E785 Hyperlipidemia, unspecified: Secondary | ICD-10-CM

## 2020-03-01 LAB — BAYER DCA HB A1C WAIVED: HB A1C (BAYER DCA - WAIVED): 6.4 % (ref <7.0)

## 2020-03-01 NOTE — Assessment & Plan Note (Addendum)
Chronic, ongoing with BP below goal.  Continue current medication regimen and adjust as needed.  Lotensin for kidney protection.  BMP today.  Recommend she monitor BP at home a few days a week and document for provider.  Could consider reduction of BP medications in future if ongoing below goal BP.  DASH diet recommended.  Return in 3 months. 

## 2020-03-01 NOTE — Assessment & Plan Note (Signed)
Refer to Morbid Obesity plan of care, recommend focus on regular exercise and healthy diet changes.

## 2020-03-01 NOTE — Assessment & Plan Note (Signed)
Chronic, stable with BP at goal and A1C below goal at 6.4%.  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  Urine ALB 30 and A:C <30 last visit, BMP today.  Return in 3 months.

## 2020-03-01 NOTE — Assessment & Plan Note (Signed)
Recommended eating smaller high protein, low fat meals more frequently and exercising 30 mins a day 5 times a week with a goal of 10-15lb weight loss in the next 3 months. Patient voiced their understanding and motivation to adhere to these recommendations.  

## 2020-03-01 NOTE — Assessment & Plan Note (Signed)
Chronic, ongoing.  Continue current medication regimen and adjust as needed.  Lipid panel next visit.    

## 2020-03-01 NOTE — Progress Notes (Signed)
BP 114/72   Pulse 65   Temp 98.1 F (36.7 C) (Oral)   Wt 189 lb 12.8 oz (86.1 kg)   SpO2 96%   BMI 36.22 kg/m    Subjective:    Patient ID: Amanda [name], female    DOB: [birth year], 64 y.o.   MRN: [id number]  HPI: Amanda [name] is a 64 y.o. female  Chief Complaint  Patient presents with  . Diabetes    pt states she has not had a recent eye exam   . Hyperlipidemia  . Hypertension   DIABETES Last A1C in February 6.9%.  Continues on [place], Glimepiride, Januvia, Trulicity.  She has stopped Januvia after discussion with CCM PharmD due to duplicate therapy types with DPP4 and GLP1.   Hypoglycemic episodes: having some, not a whole lot, when really agitated -- sweating -- does not check sugars at work, but at home has been in 40's to 70's -- been long time since she has had a 40 Polydipsia/polyuria: no Visual disturbance: no Chest pain: no Paresthesias: no Glucose Monitoring: no             Accucheck frequency: rarely             Fasting glucose: 54 - 110             Post prandial:             Evening:             Before meals: Taking Insulin?: no             Long acting insulin:             Short acting insulin: Blood Pressure Monitoring: rarely Retinal Examination: Not Up To Date Foot Exam: Up to Date Pneumovax: Up to Date Influenza: Up to Date Aspirin: yes   HYPERTENSION / HYPERLIPIDEMIA Continues on HCTZ, Carvedilol, Benazepril, and Lovastatin + ASA. Satisfied with current treatment? yes Duration of hypertension: chronic BP monitoring frequency: rarely BP range: 120/70's BP medication side effects: no Duration of hyperlipidemia: chronic Cholesterol medication side effects: no Cholesterol supplements: none Medication compliance: good compliance Aspirin: yes Recent stressors: no Recurrent headaches: no Visual changes: no Palpitations: no Dyspnea: no Chest pain: no Lower extremity edema: no Dizzy/lightheaded: no   CHRONIC KIDNEY DISEASE October labs  showed GFR 52 and CRT 1.12. CKD status: stable Medications renally dose: yes Previous renal evaluation: no Pneumovax:  Up to Date Influenza Vaccine:  Up to Date  INSOMNIA Continues on Trazodone 50 MG with good benefit, at times does not help, but majority of time offers benefit. Duration: chronic Satisfied with sleep quality: yes Difficulty falling asleep: yes Difficulty staying asleep: no Waking a few hours after sleep onset: no Early morning awakenings: no Daytime hypersomnolence: no Wakes feeling refreshed: yes Good sleep hygiene: yes Apnea: no Snoring: no Depressed/anxious mood: no Recent stress: no Restless legs/nocturnal leg cramps: no Chronic pain/arthritis: yes History of sleep study: no Treatments attempted: none   Relevant past medical, surgical, family and social history reviewed and updated as indicated. Interim medical history since our last visit reviewed. Allergies and medications reviewed and updated.  Review of Systems  Constitutional: Negative for activity change, appetite change, diaphoresis, fatigue and fever.  Respiratory: Negative for cough, chest tightness and shortness of breath.   Cardiovascular: Negative for chest pain, palpitations and leg swelling.  Gastrointestinal: Negative.   Endocrine: Negative for cold intolerance, heat intolerance, polydipsia, polyphagia and polyuria.  Neurological: Negative.  Psychiatric/Behavioral: Negative.     Per HPI unless specifically indicated above     Objective:    BP 114/72   Pulse 65   Temp 98.1 F (36.7 C) (Oral)   Wt 189 lb 12.8 oz (86.1 kg)   SpO2 96%   BMI 36.22 kg/m   Wt Readings from Last 3 Encounters:  03/01/20 189 lb 12.8 oz (86.1 kg)  09/21/19 189 lb 6.4 oz (85.9 kg)  09/30/18 185 lb (83.9 kg)    Physical Exam Vitals and nursing note reviewed.  Constitutional:      General: She is awake. She is not in acute distress.    Appearance: She is well-developed. She is obese. She is not  ill-appearing.  HENT:     Head: Normocephalic.     Right Ear: Hearing normal.     Left Ear: Hearing normal.     Nose: Nose normal.     Mouth/Throat:     Mouth: Mucous membranes are moist.  Eyes:     General: Lids are normal.        Right eye: No discharge.        Left eye: No discharge.     Conjunctiva/sclera: Conjunctivae normal.     Pupils: Pupils are equal, round, and reactive to light.  Neck:     Thyroid: No thyromegaly.     Vascular: No carotid bruit or JVD.  Cardiovascular:     Rate and Rhythm: Normal rate and regular rhythm.     Heart sounds: Normal heart sounds. No murmur. No gallop.   Pulmonary:     Effort: Pulmonary effort is normal.     Breath sounds: Normal breath sounds.  Abdominal:     General: Bowel sounds are normal.     Palpations: Abdomen is soft. There is no hepatomegaly or splenomegaly.  Musculoskeletal:     Cervical back: Normal range of motion and neck supple.     Right lower leg: No edema.     Left lower leg: No edema.  Lymphadenopathy:     Cervical: No cervical adenopathy.  Skin:    General: Skin is warm and dry.  Neurological:     Mental Status: She is alert and oriented to person, place, and time.  Psychiatric:        Attention and Perception: Attention normal.        Mood and Affect: Mood normal.        Behavior: Behavior normal. Behavior is cooperative.        Thought Content: Thought content normal.        Judgment: Judgment normal.    Results for orders placed or performed in visit on 03/01/20  Bayer DCA Hb A1c Waived  Result Value Ref Range   HB A1C (BAYER DCA - WAIVED) 6.4 <7.0 %      Assessment & Plan:   Problem List Items Addressed This Visit      Cardiovascular and Mediastinum   Hypertension associated with diabetes (HCC)    Chronic, ongoing with BP below goal.  Continue current medication regimen and adjust as needed.  Lotensin for kidney protection.  BMP today.  Recommend she monitor BP at home a few days a week and document  for provider.  Could consider reduction of BP medications in future if ongoing below goal BP.  DASH diet recommended.  Return in 3 months.        Endocrine   CKD stage 3 due to type 2 diabetes mellitus (HCC)  Chronic, stable with BP at goal and A1C below goal at 6.4%.  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  Urine ALB 30 and A:C <30 last visit, BMP today.  Return in 3 months.      Hyperlipidemia associated with type 2 diabetes mellitus (HCC)    Chronic, ongoing.  Continue current medication regimen and adjust as needed.  Lipid panel next visit.      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  Due to continue goal A1C, will discontinue Glimepiride to prevent hypoglycemia, continue Synjardy and Trulicity, could increase Trulicity if needed -- discussed with patient and she is to notify PharmD or provider if consistent fasting BS >130 at home.  Discussed this plan at length with her.   Continue to monitor BS at home daily. Plan to see her back in 3 months for follow-up, sooner if elevation in BS.       Relevant Orders   Bayer DCA Hb A1c Waived (Completed)   Basic metabolic panel     Other   Insomnia    Chronic, stable.  Continue Trazodone and adjust dose as needed.  May consider sleep study in future.  Return in 3 months.      Morbid obesity (HCC)    Recommended eating smaller high protein, low fat meals more frequently and exercising 30 mins a day 5 times a week with a goal of 10-15lb weight loss in the next 3 months. Patient voiced their understanding and motivation to adhere to these recommendations.       BMI 36.0-36.9,adult    Refer to Morbid Obesity plan of care, recommend focus on regular exercise and healthy diet changes.          Follow up plan: Return in about 3 months (around 06/01/2020) for T2DM, HTN/HLD.

## 2020-03-01 NOTE — Patient Instructions (Signed)

## 2020-03-01 NOTE — Assessment & Plan Note (Signed)
Chronic, stable.  Continue Trazodone and adjust dose as needed.  May consider sleep study in future.  Return in 3 months. 

## 2020-03-01 NOTE — Assessment & Plan Note (Addendum)
Chronic, ongoing with A1C 6.4% today and recent urine ALB 30 and A:C <30.  Due to continue goal A1C, will discontinue Glimepiride to prevent hypoglycemia, continue Synjardy and Trulicity, could increase Trulicity if needed -- discussed with patient and she is to notify PharmD or provider if consistent fasting BS >130 at home.  Discussed this plan at length with her.   Continue to monitor BS at home daily. Plan to see her back in 3 months for follow-up, sooner if elevation in BS.

## 2020-03-02 ENCOUNTER — Telehealth: Payer: Self-pay | Admitting: Family Medicine

## 2020-03-02 LAB — BASIC METABOLIC PANEL
BUN/Creatinine Ratio: 21 (ref 12–28)
BUN: 27 mg/dL (ref 8–27)
CO2: 25 mmol/L (ref 20–29)
Calcium: 9.5 mg/dL (ref 8.7–10.3)
Chloride: 102 mmol/L (ref 96–106)
Creatinine, Ser: 1.27 mg/dL — ABNORMAL HIGH (ref 0.57–1.00)
GFR calc Af Amer: 52 mL/min/{1.73_m2} — ABNORMAL LOW (ref >59)
GFR calc non Af Amer: 45 mL/min/{1.73_m2} — ABNORMAL LOW (ref >59)
Glucose: 65 mg/dL (ref 65–99)
Potassium: 4.2 mmol/L (ref 3.5–5.2)
Sodium: 142 mmol/L (ref 134–144)

## 2020-03-02 NOTE — Progress Notes (Signed)
Good morning, please let Amanda [name] know her labs have returned and kidneys continue to show some mild kidney disease with no significant decline.  Remainder of electrolytes look good.  We will continue to monitor this.  Have a great day!!!

## 2020-03-02 NOTE — Chronic Care Management (AMB) (Signed)
  Care Management   Note  03/02/2020 Name: [name] MRN: [id number] DOB: 07-05-1956  Amanda [name] is a 64 y.o. year old female who is a primary care patient of Crissman, Redge Gainer, MD and is actively engaged with the care management team. I reached out to Zella Ball A Mcclatchey by phone today to assist with re-scheduling a follow up visit with the Pharmacist  Follow up plan: Telephone appointment with care management team member scheduled for:03/28/2020  Penne Lash, RMA Care Guide, Embedded Care Coordination [facility]  [place], [place] [address] Direct Dial: [phone] [email or web address]: [email or web address]

## 2020-03-02 NOTE — Chronic Care Management (AMB) (Signed)
  Care Management   Note  03/02/2020 Name: BREANAH FADDIS MRN: [id number] DOB: [birth year]  Jamison Neighbor Beitler is a 64 y.o. year old female who is a primary care patient of Dossie Arbour, Redge Gainer, MD and is actively engaged with the care management team. I reached out to Trinda A Terada by phone today to assist with re-scheduling a follow up visit with the Pharmacist  Follow up plan: Unsuccessful telephone outreach attempt made. A HIPPA compliant phone message was left for the patient providing contact information and requesting a return call.  The care management team will reach out to the patient again over the next 7 days.  If patient returns call to provider office, please advise to call Embedded Care Management Care Guide Penne Lash  at [phone]  Penne Lash, RMA Care Guide, Embedded Care Coordination [facility]  [place], [place] [address] Direct Dial: [phone] [email or web address]

## 2020-03-11 ENCOUNTER — Other Ambulatory Visit: Payer: Self-pay | Admitting: Nurse Practitioner

## 2020-03-11 NOTE — Telephone Encounter (Signed)
Requested Prescriptions  Pending Prescriptions Disp Refills  . meloxicam (MOBIC) 15 MG tablet [Pharmacy Med Name: Meloxicam 15 MG Oral Tablet] 90 tablet 0    Sig: Take 1 tablet by mouth once daily     Analgesics:  COX2 Inhibitors Failed - 03/11/2020 11:28 AM      Failed - Cr in normal range and within 360 days    Creatinine, Ser  Date Value Ref Range Status  03/01/2020 1.27 (H) 0.57 - 1.00 mg/dL Final         Passed - HGB in normal range and within 360 days    Hemoglobin  Date Value Ref Range Status  08/18/2019 11.5 11.1 - 15.9 g/dL Final         Passed - Patient is not pregnant      Passed - Valid encounter within last 12 months    Recent Outpatient Visits          1 week ago Type 2 diabetes with nephropathy (HCC)   [facility] Pollard, [name], NP   3 months ago Type 2 diabetes with nephropathy (HCC)   [facility] Cannady, [name], NP   5 months ago Annual physical exam   [facility] Cannady, [name], NP   7 months ago PE (physical exam), annual   [facility] Crissman, Redge Gainer, MD   1 year ago Essential hypertension   [facility] Crissman, Redge Gainer, MD      Future Appointments            In 2 months Cannady, [name], NP [employer] [name]

## 2020-03-28 ENCOUNTER — Ambulatory Visit: Payer: Self-pay | Admitting: Pharmacist

## 2020-03-28 DIAGNOSIS — E1169 Type 2 diabetes mellitus with other specified complication: Secondary | ICD-10-CM

## 2020-03-28 DIAGNOSIS — E1121 Type 2 diabetes mellitus with diabetic nephropathy: Secondary | ICD-10-CM

## 2020-03-28 NOTE — Patient Instructions (Signed)
Visit Information  Goals Addressed            This Visit's Progress     Patient Stated   . PharmD "I can't afford this medication" (pt-stated)       CARE PLAN ENTRY (see longtitudinal plan of care for additional care plan information)  Current Barriers:  . Diabetes: controlled, complicated by chronic medical conditions including CKD, HLD, obesity, most recent A1c 6.4%  o S/p d/c of glimepiride by PCP at last appointment . Most recent eGFR: ~52 mL/min . Current antihyperglycemic regimen: Synjardy XR 57.05/4695 mg BID, Trulicity 1.5 mg weekly . Current blood glucose readings:  o Fastings: generally 120-130s, though has been eating supper late the past few days, so fastings have been 150-160s . Denies any episodes of hypoglycemia  . Current meal patterns:  o Lunch: eating more vegetables for lunch (broccoli, cauliflower, and carrots) rather than pretzels/popcorn; fresh fruits o Drinks: Water, unsweet tea, sometimes coffee  o Has cut down sizes of diet sodas (12 oz instead of 20 oz) . Cardiovascular risk reduction: o Current hypertensive regimen: carvedilol 25 mg BID, HCTZ 25 mg daily, benazepril 40 mg daily; last clinic BP at goal <130/80 o Current hyperlipidemia regimen: lovastatin 40 mg (2 20 mg tab daily); last LDL NOT at goal <70 o Current antiplatelet regimen: ASA 81 mg daily  Pharmacist Clinical Goal(s):  Marland Kitchen Over the next 90 days, patient will work with PharmD and primary care provider to address optimized medication management  Interventions: . Comprehensive medication review performed, medication list updated in electronic medical record . Inter-disciplinary care team collaboration (see longitudinal plan of care) . Reviewed goal A1c, goal fasting, and goal 2 hour post prandial glucose readings.  . Encouraged to continue to check fastings, and add occasional 2 hour post prandial glucose readings.  . Discussed to contact us if fasting <130 consistently, and can consider  increasing Trulicity to 3 mg weekly.  . Evaluate LDL at next visit and consider changing lovastatin 2 tab daily to 1 tab of high intensity statin to target LDL <70  Patient Self Care Activities:  . Patient will check blood glucose daily, document, and provide at future appointments . Patient will take medications as prescribed . Patient will report any questions or concerns to provider   Please see past updates related to this goal by clicking on the "Past Updates" button in the selected goal         Patient verbalizes understanding of instructions provided today.   Plan:  - Scheduled f/u call in ~ 12 weeks  Catie Darnelle Maffucci, PharmD, [place] [phone]

## 2020-03-28 NOTE — Chronic Care Management (AMB) (Signed)
Chronic Care Management   Follow Up Note   03/28/2020 Name: Amanda [name] MRN: [id number] DOB: 1956/04/23  Referred by: Guadalupe Maple, MD Reason for referral : Chronic Care Management (Medication Management)   FRANCESA EUGENIO is a 64 y.o. year old female who is a primary care patient of Crissman, Jeannette How, MD. The CCM team was consulted for assistance with chronic disease management and care coordination needs.    Contacted patient for medication management review.  Review of patient status, including review of consultants reports, relevant laboratory and other test results, and collaboration with appropriate care team members and the patient's provider was performed as part of comprehensive patient evaluation and provision of chronic care management services.    SDOH (Social Determinants of Health) assessments performed: Yes See Care Plan activities for detailed interventions related to [facility])     Outpatient Encounter Medications as of 03/28/2020  Medication Sig  . Dulaglutide (TRULICITY) 1.5 VQ/0.0QQ SOPN INJECT 1.5 MG  SUBCUTANEOUSLY ONCE A WEEK  . Empagliflozin-metFORMIN HCl ER (SYNJARDY XR) 12.02-999 MG TB24 Take 1 tablet by mouth 2 (two) times daily.  . hydrochlorothiazide (HYDRODIURIL) 25 MG tablet Take 1 tablet (25 mg total) by mouth daily.  Marland Kitchen aspirin (ASPIRIN EC) 81 MG EC tablet Take 81 mg by mouth daily. Swallow whole.  . benazepril (LOTENSIN) 40 MG tablet Take 1 tablet (40 mg total) by mouth daily.  . Calcium Carb-Cholecalciferol (CALCIUM 600+D3) 600-200 MG-UNIT TABS Take 1 tablet by mouth daily.  . carvedilol (COREG) 25 MG tablet Take 1 tablet (25 mg total) by mouth 2 (two) times daily with a meal.  . lovastatin (MEVACOR) 20 MG tablet Take 2 tablets (40 mg total) by mouth at bedtime.  . meloxicam (MOBIC) 15 MG tablet Take 1 tablet by mouth once daily  . Multiple Vitamin (MULTIVITAMIN) tablet Take 1 tablet by mouth daily.  Marland Kitchen omeprazole (PRILOSEC) 20 MG capsule Take 20 mg by mouth  daily.  . traZODone (DESYREL) 50 MG tablet Take 1 tablet (50 mg total) by mouth at bedtime.   No facility-administered encounter medications on file as of 03/28/2020.     Objective:   Goals Addressed            This Visit's Progress     Patient Stated   . PharmD "I can't afford this medication" (pt-stated)       CARE PLAN ENTRY (see longtitudinal plan of care for additional care plan information)  Current Barriers:  . Diabetes: controlled, complicated by chronic medical conditions including CKD, HLD, obesity, most recent A1c 6.4%  o S/p d/c of glimepiride by PCP at last appointment . Most recent eGFR: ~52 mL/min . Current antihyperglycemic regimen: Synjardy XR 76.10/9507 mg BID, Trulicity 1.5 mg weekly . Current blood glucose readings:  o Fastings: generally 120-130s, though has been eating supper late the past few days, so fastings have been 150-160s . Denies any episodes of hypoglycemia  . Current meal patterns:  o Lunch: eating more vegetables for lunch (broccoli, cauliflower, and carrots) rather than pretzels/popcorn; fresh fruits o Drinks: Water, unsweet tea, sometimes coffee  o Has cut down sizes of diet sodas (12 oz instead of 20 oz) . Cardiovascular risk reduction: o Current hypertensive regimen: carvedilol 25 mg BID, HCTZ 25 mg daily, benazepril 40 mg daily; last clinic BP at goal <130/80 o Current hyperlipidemia regimen: lovastatin 40 mg (2 20 mg tab daily); last LDL NOT at goal <70 o Current antiplatelet regimen: ASA 81 mg daily  Pharmacist  Clinical Goal(s):  Marland Kitchen Over the next 90 days, patient will work with PharmD and primary care provider to address optimized medication management  Interventions: . Comprehensive medication review performed, medication list updated in electronic medical record . Inter-disciplinary care team collaboration (see longitudinal plan of care) . Reviewed goal A1c, goal fasting, and goal 2 hour post prandial glucose readings.  . Encouraged  to continue to check fastings, and add occasional 2 hour post prandial glucose readings.  . Discussed to contact us if fasting <130 consistently, and can consider increasing Trulicity to 3 mg weekly.  . Evaluate LDL at next visit and consider changing lovastatin 2 tab daily to 1 tab of high intensity statin to target LDL <70  Patient Self Care Activities:  . Patient will check blood glucose daily, document, and provide at future appointments . Patient will take medications as prescribed . Patient will report any questions or concerns to provider   Please see past updates related to this goal by clicking on the "Past Updates" button in the selected goal          Plan:  - Scheduled f/u call in ~ 12 weeks  Catie Darnelle Maffucci, PharmD, [place] [phone]

## 2020-04-07 ENCOUNTER — Telehealth: Payer: Self-pay

## 2020-06-02 ENCOUNTER — Other Ambulatory Visit: Payer: Self-pay

## 2020-06-02 ENCOUNTER — Ambulatory Visit: Payer: 59 | Admitting: Nurse Practitioner

## 2020-06-02 ENCOUNTER — Encounter: Payer: Self-pay | Admitting: Nurse Practitioner

## 2020-06-02 VITALS — BP 127/73 | HR 61 | Temp 98.4°F | Wt 185.6 lb

## 2020-06-02 DIAGNOSIS — I152 Hypertension secondary to endocrine disorders: Secondary | ICD-10-CM

## 2020-06-02 DIAGNOSIS — E1122 Type 2 diabetes mellitus with diabetic chronic kidney disease: Secondary | ICD-10-CM | POA: Diagnosis not present

## 2020-06-02 DIAGNOSIS — E785 Hyperlipidemia, unspecified: Secondary | ICD-10-CM

## 2020-06-02 DIAGNOSIS — E1169 Type 2 diabetes mellitus with other specified complication: Secondary | ICD-10-CM | POA: Diagnosis not present

## 2020-06-02 DIAGNOSIS — E1121 Type 2 diabetes mellitus with diabetic nephropathy: Secondary | ICD-10-CM

## 2020-06-02 DIAGNOSIS — N183 Chronic kidney disease, stage 3 unspecified: Secondary | ICD-10-CM

## 2020-06-02 DIAGNOSIS — E1159 Type 2 diabetes mellitus with other circulatory complications: Secondary | ICD-10-CM | POA: Diagnosis not present

## 2020-06-02 DIAGNOSIS — M25512 Pain in left shoulder: Secondary | ICD-10-CM | POA: Insufficient documentation

## 2020-06-02 DIAGNOSIS — Z6836 Body mass index (BMI) 36.0-36.9, adult: Secondary | ICD-10-CM

## 2020-06-02 DIAGNOSIS — I10 Essential (primary) hypertension: Secondary | ICD-10-CM

## 2020-06-02 LAB — BAYER DCA HB A1C WAIVED: HB A1C (BAYER DCA - WAIVED): 8.2 % — ABNORMAL HIGH (ref <7.0)

## 2020-06-02 MED ORDER — TIZANIDINE HCL 4 MG PO TABS: 4.0000 mg | ORAL_TABLET | Freq: Four times a day (QID) | ORAL | 0 refills | Status: DC | PRN

## 2020-06-02 MED ORDER — TRULICITY 3 MG/0.5ML ~~LOC~~ SOAJ
3.0000 mg | SUBCUTANEOUS | 3 refills | Status: DC
Start: 2020-06-02 — End: 2021-02-01

## 2020-06-02 NOTE — Patient Instructions (Addendum)
Diclofenac gel over the counter  Diabetes Mellitus and Nutrition, Adult When you have diabetes (diabetes mellitus), it is very important to have healthy eating habits because your blood sugar (glucose) levels are greatly affected by what you eat and drink. Eating healthy foods in the appropriate amounts, at about the same times every day, can help you:  Control your blood glucose.  Lower your risk of heart disease.  Improve your blood pressure.  Reach or maintain a healthy weight. Every person with diabetes is different, and each person has different needs for a meal plan. Your health care provider may recommend that you work with a diet and nutrition specialist (dietitian) to make a meal plan that is best for you. Your meal plan may vary depending on factors such as:  The calories you need.  The medicines you take.  Your weight.  Your blood glucose, blood pressure, and cholesterol levels.  Your activity level.  Other health conditions you have, such as heart or kidney disease. How do carbohydrates affect me? Carbohydrates, also called carbs, affect your blood glucose level more than any other type of food. Eating carbs naturally raises the amount of glucose in your blood. Carb counting is a method for keeping track of how many carbs you eat. Counting carbs is important to keep your blood glucose at a healthy level, especially if you use insulin or take certain oral diabetes medicines. It is important to know how many carbs you can safely have in each meal. This is different for every person. Your dietitian can help you calculate how many carbs you should have at each meal and for each snack. Foods that contain carbs include:  Bread, cereal, rice, pasta, and crackers.  Potatoes and corn.  Peas, beans, and lentils.  Milk and yogurt.  Fruit and juice.  Desserts, such as cakes, cookies, ice cream, and candy. How does alcohol affect me? Alcohol can cause a sudden decrease in  blood glucose (hypoglycemia), especially if you use insulin or take certain oral diabetes medicines. Hypoglycemia can be a life-threatening condition. Symptoms of hypoglycemia (sleepiness, dizziness, and confusion) are similar to symptoms of having too much alcohol. If your health care provider says that alcohol is safe for you, follow these guidelines:  Limit alcohol intake to no more than 1 drink per day for nonpregnant women and 2 drinks per day for men. One drink equals 12 oz of beer, 5 oz of wine, or 1 oz of hard liquor.  Do not drink on an empty stomach.  Keep yourself hydrated with water, diet soda, or unsweetened iced tea.  Keep in mind that regular soda, juice, and other mixers may contain a lot of sugar and must be counted as carbs. What are tips for following this plan?  Reading food labels  Start by checking the serving size on the "Nutrition Facts" label of packaged foods and drinks. The amount of calories, carbs, fats, and other nutrients listed on the label is based on one serving of the item. Many items contain more than one serving per package.  Check the total grams (g) of carbs in one serving. You can calculate the number of servings of carbs in one serving by dividing the total carbs by 15. For example, if a food has 30 g of total carbs, it would be equal to 2 servings of carbs.  Check the number of grams (g) of saturated and trans fats in one serving. Choose foods that have low or no amount of these  fats.  Check the number of milligrams (mg) of salt (sodium) in one serving. Most people should limit total sodium intake to less than 2,300 mg per day.  Always check the nutrition information of foods labeled as "low-fat" or "nonfat". These foods may be higher in added sugar or refined carbs and should be avoided.  Talk to your dietitian to identify your daily goals for nutrients listed on the label. Shopping  Avoid buying canned, premade, or processed foods. These foods  tend to be high in fat, sodium, and added sugar.  Shop around the outside edge of the grocery store. This includes fresh fruits and vegetables, bulk grains, fresh meats, and fresh dairy. Cooking  Use low-heat cooking methods, such as baking, instead of high-heat cooking methods like deep frying.  Cook using healthy oils, such as olive, canola, or sunflower oil.  Avoid cooking with butter, cream, or high-fat meats. Meal planning  Eat meals and snacks regularly, preferably at the same times every day. Avoid going long periods of time without eating.  Eat foods high in fiber, such as fresh fruits, vegetables, beans, and whole grains. Talk to your dietitian about how many servings of carbs you can eat at each meal.  Eat 4-6 ounces (oz) of lean protein each day, such as lean meat, chicken, fish, eggs, or tofu. One oz of lean protein is equal to: ? 1 oz of meat, chicken, or fish. ? 1 egg. ?  cup of tofu.  Eat some foods each day that contain healthy fats, such as avocado, nuts, seeds, and fish. Lifestyle  Check your blood glucose regularly.  Exercise regularly as told by your health care provider. This may include: ? 150 minutes of moderate-intensity or vigorous-intensity exercise each week. This could be brisk walking, biking, or water aerobics. ? Stretching and doing strength exercises, such as yoga or weightlifting, at least 2 times a week.  Take medicines as told by your health care provider.  Do not use any products that contain nicotine or tobacco, such as cigarettes and e-cigarettes. If you need help quitting, ask your health care provider.  Work with a Veterinary surgeon or diabetes educator to identify strategies to manage stress and any emotional and social challenges. Questions to ask a health care provider  Do I need to meet with a diabetes educator?  Do I need to meet with a dietitian?  What number can I call if I have questions?  When are the best times to check my blood  glucose? Where to find more information:  American Diabetes Association: diabetes.org  Academy of Nutrition and Dietetics: www.eatright.[employer] of Diabetes and Digestive and Kidney Diseases (NIH): CarFlippers.tn Summary  A healthy meal plan will help you control your blood glucose and maintain a healthy lifestyle.  Working with a diet and nutrition specialist (dietitian) can help you make a meal plan that is best for you.  Keep in mind that carbohydrates (carbs) and alcohol have immediate effects on your blood glucose levels. It is important to count carbs and to use alcohol carefully. This information is not intended to replace advice given to you by your health care provider. Make sure you discuss any questions you have with your health care provider. Document Revised: 09/19/2017 Document Reviewed: 11/11/2016 Elsevier Patient Education  2020 [employer].

## 2020-06-02 NOTE — Progress Notes (Signed)
BP 127/73    Pulse 61    Temp 98.4 F (36.9 C) (Oral)    Wt 185 lb 9.6 oz (84.2 kg)    SpO2 99%    BMI 35.42 kg/m    Subjective:    Patient ID: Amanda [name], female    DOB: Feb 22, 1956, 64 y.o.   MRN: [id number]  HPI: Amanda [name] is a 64 y.o. female  Chief Complaint  Patient presents with   Diabetes   Hyperlipidemia   Hypertension   DIABETES Last A1C in February 6.4%.  Continues on Drain and Trulicity.  She has stopped Januvia after discussion with CCM PharmD due to duplicate therapy types with DPP4 and GLP1.   Hypoglycemic episodes: none, since stopping Glimepiride Polydipsia/polyuria: no Visual disturbance: no Chest pain: no Paresthesias: no Glucose Monitoring: no             Accucheck frequency: rarely, only when not feeling good             Fasting glucose:              Post prandial:             Evening: < 230, but higher for her recently             Before meals: Taking Insulin?: no             Long acting insulin:             Short acting insulin: Blood Pressure Monitoring: rarely Retinal Examination: Not Up To Date Foot Exam: Up to Date Pneumovax: Up to Date Influenza: Up to Date Aspirin: yes   HYPERTENSION / HYPERLIPIDEMIA Continues on HCTZ, Carvedilol, Benazepril, and Lovastatin + ASA. Satisfied with current treatment? yes Duration of hypertension: chronic BP monitoring frequency: rarely BP range: 120/70's BP medication side effects: no Duration of hyperlipidemia: chronic Cholesterol medication side effects: no Cholesterol supplements: none Medication compliance: good compliance Aspirin: yes Recent stressors: no Recurrent headaches: no Visual changes: no Palpitations: no Dyspnea: no Chest pain: no Lower extremity edema: no Dizzy/lightheaded: no   CHRONIC KIDNEY DISEASE October labs showed GFR 45 and CRT 1.27. CKD status: stable Medications renally dose: yes Previous renal evaluation: no Pneumovax:  Up to Date Influenza Vaccine:  Up  to Date  SHOULDER PAIN Left shoulder pain for 2 days, starts in her neck and runs into shoulder blade.  Has been lifting 10 little ones into swings, as they do not know how to get in them by themselves.  Plus has been moving furniture a lot.   Duration: days Involved shoulder: left Mechanism of injury: unknown Location: posterior Onset:gradual Severity: 3/10  Quality:  dull and aching Frequency: intermittent Radiation: no Aggravating factors: movement  Alleviating factors: Aspercreme and ASA  Status: stable Treatments attempted: Aspercreme  Relief with NSAIDs?:  No NSAIDs Taken Weakness: no Numbness: no Decreased grip strength: no Redness: no Swelling: no Bruising: no Fevers: no  Relevant past medical, surgical, family and social history reviewed and updated as indicated. Interim medical history since our last visit reviewed. Allergies and medications reviewed and updated.  Review of Systems  Constitutional: Negative for activity change, appetite change, diaphoresis, fatigue and fever.  Respiratory: Negative for cough, chest tightness and shortness of breath.   Cardiovascular: Negative for chest pain, palpitations and leg swelling.  Gastrointestinal: Negative.   Endocrine: Negative for cold intolerance, heat intolerance, polydipsia, polyphagia and polyuria.  Neurological: Negative.   Psychiatric/Behavioral: Negative.     Per  HPI unless specifically indicated above     Objective:    BP 127/73    Pulse 61    Temp 98.4 F (36.9 C) (Oral)    Wt 185 lb 9.6 oz (84.2 kg)    SpO2 99%    BMI 35.42 kg/m   Wt Readings from Last 3 Encounters:  06/02/20 185 lb 9.6 oz (84.2 kg)  03/01/20 189 lb 12.8 oz (86.1 kg)  09/21/19 189 lb 6.4 oz (85.9 kg)    Physical Exam Vitals and nursing note reviewed.  Constitutional:      General: She is awake. She is not in acute distress.    Appearance: She is well-developed. She is obese. She is not ill-appearing.  HENT:     Head:  Normocephalic.     Right Ear: Hearing normal.     Left Ear: Hearing normal.     Nose: Nose normal.     Mouth/Throat:     Mouth: Mucous membranes are moist.  Eyes:     General: Lids are normal.        Right eye: No discharge.        Left eye: No discharge.     Conjunctiva/sclera: Conjunctivae normal.     Pupils: Pupils are equal, round, and reactive to light.  Neck:     Thyroid: No thyromegaly.     Vascular: No carotid bruit or JVD.  Cardiovascular:     Rate and Rhythm: Normal rate and regular rhythm.     Heart sounds: Normal heart sounds. No murmur heard.  No gallop.   Pulmonary:     Effort: Pulmonary effort is normal.     Breath sounds: Normal breath sounds.  Abdominal:     General: Bowel sounds are normal.     Palpations: Abdomen is soft. There is no hepatomegaly or splenomegaly.  Musculoskeletal:     Right shoulder: Normal.     Left shoulder: Tenderness (mild to posterior aspect) present. No swelling, effusion, laceration or bony tenderness. Normal range of motion. Normal strength.     Cervical back: Normal range of motion and neck supple.     Right lower leg: No edema.     Left lower leg: No edema.     Comments: Mild tenderness with empty can maneuver, but no weakness.  Negative Neer and Hawkins.  No rashes noted.  Lymphadenopathy:     Cervical: No cervical adenopathy.  Skin:    General: Skin is warm and dry.  Neurological:     Mental Status: She is alert and oriented to person, place, and time.  Psychiatric:        Attention and Perception: Attention normal.        Mood and Affect: Mood normal.        Behavior: Behavior normal. Behavior is cooperative.        Thought Content: Thought content normal.        Judgment: Judgment normal.    Results for orders placed or performed in visit on 06/02/20  Bayer DCA Hb A1c Waived  Result Value Ref Range   HB A1C (BAYER DCA - WAIVED) 8.2 (H) <7.0 %      Assessment & Plan:   Problem List Items Addressed This Visit       Cardiovascular and Mediastinum   Hypertension associated with diabetes (HCC)    Chronic, ongoing with BP below goal.  Continue current medication regimen and adjust as needed.  Lotensin for kidney protection.  BMP today.  Recommend she  monitor BP at home a few days a week and document for provider.  Could consider reduction of BP medications in future if ongoing below goal BP.  DASH diet recommended.  Return in 3 months.      Relevant Medications   Dulaglutide (TRULICITY) 3 MG/0.5ML SOPN   Other Relevant Orders   Bayer DCA Hb A1c Waived (Completed)   Basic metabolic panel     Endocrine   CKD stage 3 due to type 2 diabetes mellitus (HCC)    Chronic, ongoing with BP at goal, but A1C above goal 8.2%.  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  Urine ALB 30 and A:C <30 last visit, BMP today.  Return in 3 months.      Relevant Medications   Dulaglutide (TRULICITY) 3 MG/0.5ML SOPN   Hyperlipidemia associated with type 2 diabetes mellitus (HCC)    Chronic, ongoing.  Continue current medication regimen and adjust as needed.  Lipid panel next visit.      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 Continue Synjardy at current dose and increase Trulicity to 3 MG (script sent), could increase Trulicity further if needed upcoming -- discussed with patient and she is to notify PharmD or provider if consistent fasting BS >130 at home.  Discussed this plan at length with her.   Continue to monitor BS at home daily, in morning. Plan to see her back in 6 weeks for follow-up, sooner if elevation in BS.       Relevant Medications   Dulaglutide (TRULICITY) 3 MG/0.5ML  SOPN   Other Relevant Orders   Bayer DCA Hb A1c Waived (Completed)     Other   Morbid obesity (HCC)    With diabetes, BMI today 35.42.  Recommended eating smaller high protein, low fat meals more frequently and exercising 30 mins a day 5 times a week with a goal of 10-15lb weight loss in the next 3 months. Patient voiced their understanding and motivation to adhere to these recommendations.       Relevant Medications   Dulaglutide (TRULICITY) 3 MG/0.5ML SOPN   BMI 36.0-36.9,adult    Refer to morbid obesity plan of care, recommend focus on regular exercise and healthy diet changes.      Acute pain of left shoulder    Acute for a few days, suspect musculoskeletal.  Script for Tizanidine sent and discussed with patient. Recommend she avoid Ibuprofen, but can use Diclofenac gel OTC as needed.  Continue Aspercreme as needed.  Recommend use of Tylenol for acute pain and alternate ice/heat.  To return to office for worsening or ongoing pain.           Follow up plan: Return in about 6 weeks (around 07/14/2020) for T2DM with recent dose increase Trulicity.

## 2020-06-02 NOTE — Assessment & Plan Note (Addendum)
Chronic, ongoing with BP below goal.  Continue current medication regimen and adjust as needed.  Lotensin for kidney protection.  BMP today.  Recommend she monitor BP at home a few days a week and document for provider.  Could consider reduction of BP medications in future if ongoing below goal BP.  DASH diet recommended.  Return in 3 months.

## 2020-06-02 NOTE — Assessment & Plan Note (Signed)
Chronic, ongoing.  Continue current medication regimen and adjust as needed.  Lipid panel next visit.    

## 2020-06-02 NOTE — Assessment & Plan Note (Signed)
Refer to morbid obesity plan of care, recommend focus on regular exercise and healthy diet changes.

## 2020-06-02 NOTE — Assessment & Plan Note (Signed)
Chronic, ongoing with A1C 8.2% today, elevated from previous, and recent urine ALB 30 and A:C <30. Continue Synjardy at current dose and increase Trulicity to 3 MG (script sent), could increase Trulicity further if needed upcoming -- discussed with patient and she is to notify PharmD or provider if consistent fasting BS >130 at home.  Discussed this plan at length with her.   Continue to monitor BS at home daily, in morning. Plan to see her back in 6 weeks for follow-up, sooner if elevation in BS.

## 2020-06-02 NOTE — Assessment & Plan Note (Signed)
With diabetes, BMI today 35.42.  Recommended eating smaller high protein, low fat meals more frequently and exercising 30 mins a day 5 times a week with a goal of 10-15lb weight loss in the next 3 months. Patient voiced their understanding and motivation to adhere to these recommendations.

## 2020-06-02 NOTE — Assessment & Plan Note (Signed)
Chronic, ongoing with BP at goal, but A1C above goal 8.2%.  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  Urine ALB 30 and A:C <30 last visit, BMP today.  Return in 3 months.

## 2020-06-02 NOTE — Assessment & Plan Note (Signed)
Acute for a few days, suspect musculoskeletal.  Script for Tizanidine sent and discussed with patient. Recommend she avoid Ibuprofen, but can use Diclofenac gel OTC as needed.  Continue Aspercreme as needed.  Recommend use of Tylenol for acute pain and alternate ice/heat.  To return to office for worsening or ongoing pain.

## 2020-06-03 LAB — LIPID PANEL W/O CHOL/HDL RATIO
Cholesterol, Total: 154 mg/dL (ref 100–199)
HDL: 50 mg/dL (ref >39)
LDL Chol Calc (NIH): 74 mg/dL (ref 0–99)
Triglycerides: 179 mg/dL — ABNORMAL HIGH (ref 0–149)
VLDL Cholesterol Cal: 30 mg/dL (ref 5–40)

## 2020-06-03 LAB — BASIC METABOLIC PANEL
BUN/Creatinine Ratio: 23 (ref 12–28)
BUN: 25 mg/dL (ref 8–27)
CO2: 24 mmol/L (ref 20–29)
Calcium: 9.4 mg/dL (ref 8.7–10.3)
Chloride: 102 mmol/L (ref 96–106)
Creatinine, Ser: 1.1 mg/dL — ABNORMAL HIGH (ref 0.57–1.00)
GFR calc Af Amer: 61 mL/min/{1.73_m2} (ref >59)
GFR calc non Af Amer: 53 mL/min/{1.73_m2} — ABNORMAL LOW (ref >59)
Glucose: 126 mg/dL — ABNORMAL HIGH (ref 65–99)
Potassium: 4.1 mmol/L (ref 3.5–5.2)
Sodium: 142 mmol/L (ref 134–144)

## 2020-06-04 NOTE — Progress Notes (Signed)
Good morning, please let Amanda [name] know her labs have returned and kidney function continues to show stable mild kidney disease which we will continue to monitor.  Cholesterol levels show close to goal LDL, but triglycerides remain above goal slightly.  May need to change or increase statin next visit OR add on another medication that focused on triglyceride lowering.  If any questions let me know.  Have a great day!!

## 2020-06-11 ENCOUNTER — Other Ambulatory Visit: Payer: Self-pay | Admitting: Internal Medicine

## 2020-06-11 NOTE — Telephone Encounter (Signed)
Requested Prescriptions  Pending Prescriptions Disp Refills  . meloxicam (MOBIC) 15 MG tablet [Pharmacy Med Name: Meloxicam 15 MG Oral Tablet] 90 tablet 0    Sig: Take 1 tablet by mouth once daily     Analgesics:  COX2 Inhibitors Failed - 06/11/2020  5:57 PM      Failed - Cr in normal range and within 360 days    Creatinine, Ser  Date Value Ref Range Status  06/02/2020 1.10 (H) 0.57 - 1.00 mg/dL Final         Passed - HGB in normal range and within 360 days    Hemoglobin  Date Value Ref Range Status  08/18/2019 11.5 11.1 - 15.9 g/dL Final         Passed - Patient is not pregnant      Passed - Valid encounter within last 12 months    Recent Outpatient Visits          1 week ago Type 2 diabetes with nephropathy (HCC)   [facility] [place], [name], NP   3 months ago Type 2 diabetes with nephropathy (HCC)   [facility] [place], [name], NP   6 months ago Type 2 diabetes with nephropathy (HCC)   [facility] [place], [name], NP   8 months ago Annual physical exam   [facility] [place], [name], NP   10 months ago PE (physical exam), annual   [facility] [place], Redge Gainer, MD      Future Appointments            In 1 month [place], [name], NP [name]

## 2020-06-13 ENCOUNTER — Telehealth: Payer: Self-pay | Admitting: *Deleted

## 2020-06-13 NOTE — Chronic Care Management (AMB) (Signed)
  Care Management   Note  06/13/2020 Name: Amanda [name] MRN: [id number] DOB: 1956-02-20  Amanda [name] is a 64 y.o. year old female who is a primary care patient of Dossie Arbour, Redge Gainer, MD and is actively engaged with the care management team. I reached out to Germani A Anagnos by phone today to assist with re-scheduling a follow up visit with the Pharmacist.  Follow up plan: Unsuccessful telephone outreach attempt made. A HIPPA compliant phone message was left for the patient providing contact information and requesting a return call. The care management team will reach out to the patient again over the next 7 days. If patient returns call to provider office, please advise to call Embedded Care Management Care Guide Gwenevere Ghazi at [phone]  [facility] Guide, Embedded Care Coordination [facility]  [place], [place] [address] Direct Dial: [phone] [email or web address] Website: [email or web address]

## 2020-06-14 NOTE — Chronic Care Management (AMB) (Signed)
  Care Management   Note  06/14/2020 Name: Amanda [name] MRN: [id number] DOB: 1956/09/30  Amanda [name] is a 64 y.o. year old female who is a primary care patient of Steele Sizer, MD and is actively engaged with the care management team. I reached out to Amanda [name] by phone today to assist with re-scheduling a follow up visit with the Pharmacist.  Follow up plan: Telephone appointment with care management team member scheduled for:  07/24/2020  [facility] Guide, Embedded Care Coordination [facility]  [place], [place] [address] Direct Dial: [phone] [email or web address] Website: [email or web address]

## 2020-06-24 ENCOUNTER — Other Ambulatory Visit: Payer: Self-pay | Admitting: Nurse Practitioner

## 2020-06-24 DIAGNOSIS — I10 Essential (primary) hypertension: Secondary | ICD-10-CM

## 2020-06-24 NOTE — Telephone Encounter (Signed)
Requested Prescriptions  Pending Prescriptions Disp Refills  . traZODone (DESYREL) 50 MG tablet [Pharmacy Med Name: traZODone HCl 50 MG Oral Tablet] 90 tablet 0    Sig: TAKE 1 TABLET BY MOUTH AT BEDTIME     Psychiatry: Antidepressants - Serotonin Modulator Passed - 06/24/2020  8:20 AM      Passed - Valid encounter within last 6 months    Recent Outpatient Visits          3 weeks ago Type 2 diabetes with nephropathy (HCC)   [facility] [place], [name], NP   3 months ago Type 2 diabetes with nephropathy (HCC)   [facility] [place], [name], NP   6 months ago Type 2 diabetes with nephropathy (HCC)   [facility] [place], [name], NP   9 months ago Annual physical exam   [facility] [place], [name], NP   10 months ago PE (physical exam), annual   [facility] [place], Redge Gainer, MD      Future Appointments            In 3 weeks Cannady, [name], NP [name]           . carvedilol (COREG) 25 MG tablet [Pharmacy Med Name: Carvedilol 25 MG Oral Tablet] 180 tablet 0    Sig: TAKE 1 TABLET BY MOUTH TWICE DAILY WITH MEALS     Cardiovascular:  Beta Blockers Passed - 06/24/2020  8:20 AM      Passed - Last BP in normal range    BP Readings from Last 1 Encounters:  06/02/20 127/73         Passed - Last Heart Rate in normal range    Pulse Readings from Last 1 Encounters:  06/02/20 61         Passed - Valid encounter within last 6 months    Recent Outpatient Visits          3 weeks ago Type 2 diabetes with nephropathy (HCC)   [facility] [place], [name], NP   3 months ago Type 2 diabetes with nephropathy (HCC)   [facility] [place], [name], NP   6 months ago Type 2 diabetes with nephropathy (HCC)   [facility] [place], [name], NP   9 months ago Annual physical exam   [facility] [place], [name], NP   10 months ago PE (physical exam),  annual   [facility] [place], Redge Gainer, MD      Future Appointments            In 3 weeks Cannady, [name], NP [name]

## 2020-06-27 ENCOUNTER — Telehealth: Payer: Self-pay

## 2020-07-14 ENCOUNTER — Telehealth: Payer: Self-pay | Admitting: Nurse Practitioner

## 2020-07-14 NOTE — Telephone Encounter (Signed)
Called to r/s 9/27 appt no answer left vm

## 2020-07-17 ENCOUNTER — Ambulatory Visit: Payer: 59 | Admitting: Nurse Practitioner

## 2020-07-24 ENCOUNTER — Telehealth: Payer: 59

## 2020-07-24 ENCOUNTER — Telehealth: Payer: Self-pay | Admitting: Pharmacist

## 2020-07-24 NOTE — Progress Notes (Signed)
  Chronic Care Management   Outreach Note  07/24/2020 Name: Amanda [name] MRN: [id number] DOB: [birth year]  Referred by: Steele Sizer, MD Reason for referral : No chief complaint on file.   An unsuccessful telephone outreach was attempted today. The patient was referred to the pharmacist for assistance with care management and care coordination.  Left HIPAA compliant message to return my call at her convenience.    Mercer Pod. Tiburcio Pea PharmD, BCPS Clinical Pharmacist [facility] [phone]

## 2020-07-25 ENCOUNTER — Telehealth: Payer: Self-pay

## 2020-07-25 NOTE — Chronic Care Management (AMB) (Signed)
  Care Management   Note  07/25/2020 Name: Amanda [name] MRN: [id number] DOB: 1956-04-14  Amanda [name] is a 64 y.o. year old female who is a primary care patient of Dossie Arbour, Redge Gainer, MD and is actively engaged with the care management team. I reached out to Charmayne A Westerman by phone today to assist with re-scheduling a follow up visit with the Pharmacist  Follow up plan: Unsuccessful telephone outreach attempt made. A HIPAA compliant phone message was left for the patient providing contact information and requesting a return call.  The care management team will reach out to the patient again over the next 7 days.  If patient returns call to provider office, please advise to call Embedded Care Management Care Guide Penne Lash  at [phone]  Penne Lash, RMA Care Guide, Embedded Care Coordination [facility]  [place], [place] [address] Direct Dial: [phone] [email or web address]

## 2020-07-28 NOTE — Telephone Encounter (Signed)
Pt has been r/s for 09/04/2020

## 2020-07-28 NOTE — Chronic Care Management (AMB) (Signed)
  Care Management   Note  07/28/2020 Name: [name] MRN: [id number] DOB: [birth year]  Amanda [name] is a 64 y.o. year old female who is a primary care patient of Crissman, Redge Gainer, MD and is actively engaged with the care management team. I reached out to Amanda [name] by phone today to assist with re-scheduling a follow up visit with the Pharmacist  Follow up plan: Telephone appointment with care management team member scheduled for:09/04/2020  Amanda [name], RMA Care Guide, Embedded Care Coordination [facility]  [place], [place] [address] Direct Dial: [phone] [email or web address]

## 2020-08-01 ENCOUNTER — Other Ambulatory Visit: Payer: Self-pay

## 2020-08-01 ENCOUNTER — Encounter: Payer: Self-pay | Admitting: Nurse Practitioner

## 2020-08-01 ENCOUNTER — Ambulatory Visit: Payer: 59 | Admitting: Nurse Practitioner

## 2020-08-01 VITALS — BP 110/73 | HR 65 | Temp 97.8°F | Ht 61.5 in | Wt 180.0 lb

## 2020-08-01 DIAGNOSIS — E1121 Type 2 diabetes mellitus with diabetic nephropathy: Secondary | ICD-10-CM

## 2020-08-01 DIAGNOSIS — Z23 Encounter for immunization: Secondary | ICD-10-CM

## 2020-08-01 NOTE — Assessment & Plan Note (Signed)
With diabetes, BMI today 33.46 today --- praised for 10 pounds of weight loss over past months!!  Recommended eating smaller high protein, low fat meals more frequently and exercising 30 mins a day 5 times a week with a goal of 10-15lb weight loss in the next 3 months. Patient voiced their understanding and motivation to adhere to these recommendations.

## 2020-08-01 NOTE — Progress Notes (Signed)
BP 110/73 (BP Location: Left Arm, Patient Position: Sitting, Cuff Size: Normal)   Pulse 65   Temp 97.8 F (36.6 C) (Oral)   Ht 5' 1.5" (1.562 m)   Wt 180 lb (81.6 kg)   SpO2 98%   BMI 33.46 kg/m    Subjective:    Patient ID: Amanda [name], female    DOB: [birth year], 64 y.o.   MRN: [id number]  HPI: SINAI ILLINGWORTH is a 64 y.o. female  Chief Complaint  Patient presents with  . Diabetes   DIABETES Last [facility] in August was 8.2%.  Continues on [place] and Trulicity -- increased to 3 MG weekly in August.  No ADR with this.  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   Hypoglycemic episodes: none, since stopping Glimepiride Polydipsia/polyuria: no Visual disturbance: no Chest pain: no Paresthesias: no Glucose Monitoring: no             Accucheck frequency: rarely, only when not feeling good             Fasting glucose: on average 140-150 -- range 136 to 224 (this was after ham biscuit with jam)             Post prandial:             Evening:              Before meals: Taking Insulin?: no             Long acting insulin:             Short acting insulin: Blood Pressure Monitoring: rarely Retinal Examination: Not Up To Date Foot Exam: Up to Date Pneumovax: Up to Date Influenza: Up to Date Aspirin: yes    Relevant past medical, surgical, family and social history reviewed and updated as indicated. Interim medical history since our last visit reviewed. Allergies and medications reviewed and updated.  Review of Systems  Constitutional: Negative for activity change, appetite change, diaphoresis, fatigue and fever.  Respiratory: Negative for cough, chest tightness and shortness of breath.   Cardiovascular: Negative for chest pain, palpitations and leg swelling.  Gastrointestinal: Negative.   Endocrine: Negative for cold intolerance, heat intolerance, polydipsia, polyphagia and polyuria.  Neurological:  Negative.   Psychiatric/Behavioral: Negative.     Per HPI unless specifically indicated above     Objective:    BP 110/73 (BP Location: Left Arm, Patient Position: Sitting, Cuff Size: Normal)   Pulse 65   Temp 97.8 F (36.6 C) (Oral)   Ht 5' 1.5" (1.562 m)   Wt 180 lb (81.6 kg)   SpO2 98%   BMI 33.46 kg/m   Wt Readings from Last 3 Encounters:  08/01/20 180 lb (81.6 kg)  06/02/20 185 lb 9.6 oz (84.2 kg)  03/01/20 189 lb 12.8 oz (86.1 kg)    Physical Exam Vitals and nursing note reviewed.  Constitutional:      General: She is awake. She is not in acute distress.    Appearance: She is well-developed. She is obese. She is not ill-appearing.  HENT:     Head: Normocephalic.     Right Ear: Hearing normal.     Left Ear: Hearing normal.     Nose: Nose normal.     Mouth/Throat:     Mouth: Mucous membranes are moist.  Eyes:     General: Lids are normal.  Right eye: No discharge.        Left eye: No discharge.     Conjunctiva/sclera: Conjunctivae normal.     Pupils: Pupils are equal, round, and reactive to light.  Neck:     Thyroid: No thyromegaly.     Vascular: No carotid bruit or JVD.  Cardiovascular:     Rate and Rhythm: Normal rate and regular rhythm.     Heart sounds: Normal heart sounds. No murmur heard.  No gallop.   Pulmonary:     Effort: Pulmonary effort is normal.     Breath sounds: Normal breath sounds.  Abdominal:     General: Bowel sounds are normal.     Palpations: Abdomen is soft. There is no hepatomegaly or splenomegaly.  Musculoskeletal:     Cervical back: Normal range of motion and neck supple.     Right lower leg: No edema.     Left lower leg: No edema.  Lymphadenopathy:     Cervical: No cervical adenopathy.  Skin:    General: Skin is warm and dry.  Neurological:     Mental Status: She is alert and oriented to person, place, and time.  Psychiatric:        Attention and Perception: Attention normal.        Mood and Affect: Mood normal.          Behavior: Behavior normal. Behavior is cooperative.        Thought Content: Thought content normal.        Judgment: Judgment normal.    Results for orders placed or performed in visit on 06/02/20  Bayer DCA Hb A1c Waived  Result Value Ref Range   HB A1C (BAYER DCA - WAIVED) 8.2 (H) <7.0 %  Basic metabolic panel  Result Value Ref Range   Glucose 126 (H) 65 - 99 mg/dL   BUN 25 8 - 27 mg/dL   Creatinine, Ser 0.03 (H) 0.57 - 1.00 mg/dL   GFR calc non Af Amer 53 (L) >59 mL/min/1.73   GFR calc Af Amer 61 >59 mL/min/1.73   BUN/Creatinine Ratio 23 12 - 28   Sodium 142 134 - 144 mmol/L   Potassium 4.1 3.5 - 5.2 mmol/L   Chloride 102 96 - 106 mmol/L   CO2 24 20 - 29 mmol/L   Calcium 9.4 8.7 - 10.3 mg/dL  Lipid Panel w/o Chol/HDL Ratio  Result Value Ref Range   Cholesterol, Total 154 100 - 199 mg/dL   Triglycerides 704 (H) 0 - 149 mg/dL   HDL 50 >88 mg/dL   VLDL Cholesterol Cal 30 5 - 40 mg/dL   LDL Chol Calc (NIH) 74 0 - 99 mg/dL      Assessment & Plan:   Problem List Items Addressed This Visit      Endocrine   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  Continue Synjardy at current dose and Trulicity to 3 MG (increased in August to this), could increase Trulicity further if needed upcoming -- discussed with patient and she is to notify PharmD or provider if consistent fasting BS >130 at home.  Currently blood sugars are trending down and she was praised for documenting these daily and bringing book to visit.  Discussed this plan at length with her.  Continue to monitor BS at home daily, in morning. Plan to see her back in 5 weeks for follow-up, sooner if elevation in BS.  Other   Morbid obesity (HCC)    With diabetes, BMI today 33.46 today --- praised for 10 pounds of weight loss over past months!!  Recommended eating smaller high protein, low fat meals more frequently and exercising 30 mins a  day 5 times a week with a goal of 10-15lb weight loss in the next 3 months. Patient voiced their understanding and motivation to adhere to these recommendations.        Other Visit Diagnoses    Need for influenza vaccination       Flu vaccine today   Relevant Orders   Flu Vaccine QUAD 36+ mos IM (Completed)       Follow up plan: Return in about 5 weeks (around 09/05/2020) for T2DM.

## 2020-08-01 NOTE — Assessment & Plan Note (Signed)
Chronic, ongoing with A1C 8.2% in August, elevated from previous.  Continue Synjardy at current dose and Trulicity to 3 MG (increased in August to this), could increase Trulicity further if needed upcoming -- discussed with patient and she is to notify PharmD or provider if consistent fasting BS >130 at home.  Currently blood sugars are trending down and she was praised for documenting these daily and bringing book to visit.  Discussed this plan at length with her.  Continue to monitor BS at home daily, in morning. Plan to see her back in 5 weeks for follow-up, sooner if elevation in BS.

## 2020-08-01 NOTE — Patient Instructions (Signed)

## 2020-09-04 ENCOUNTER — Ambulatory Visit: Payer: Medicare HMO | Admitting: Pharmacist

## 2020-09-04 DIAGNOSIS — I10 Essential (primary) hypertension: Secondary | ICD-10-CM

## 2020-09-04 DIAGNOSIS — E1159 Type 2 diabetes mellitus with other circulatory complications: Secondary | ICD-10-CM

## 2020-09-04 DIAGNOSIS — I152 Hypertension secondary to endocrine disorders: Secondary | ICD-10-CM

## 2020-09-04 DIAGNOSIS — E1121 Type 2 diabetes mellitus with diabetic nephropathy: Secondary | ICD-10-CM

## 2020-09-04 NOTE — Progress Notes (Signed)
Chronic Care Management Pharmacy  Name: Amanda [name]  MRN: [id number] DOB: 1955-12-15   Chief Complaint/ HPI  Amanda [name],  64 y.o. , female presents for their Follow-Up CCM visit with the clinical pharmacist via telephone.  PCP : Guadalupe Maple, MD Patient Care Team: Guadalupe Maple, MD as PCP - General (Family Medicine) Vladimir Faster, [facility] (Pharmacist)  Their chronic conditions include: Hypertension, Hyperlipidemia, Diabetes, GERD and Chronic Kidney Disease   Office Visits: 08/01/20 - Jolene- 10 lb weight loss 06/02/20- Jolene - A1c 8.2 increased from 6.4, Trulicity increased to 58m q week, l shoulder pain- tizanidine 03/28/20-Leonie Green PharmD - consider high intensity statin, ? increase Trulicity    Allergies  Allergen Reactions  . Penicillins   . Tramadol Other (See Comments)    "woozy"    Medications: Outpatient Encounter Medications as of 09/04/2020  Medication Sig  . aspirin (ASPIRIN EC) 81 MG EC tablet Take 81 mg by mouth daily. Swallow whole.  . benazepril (LOTENSIN) 40 MG tablet Take 1 tablet (40 mg total) by mouth daily.  . Calcium Carb-Cholecalciferol (CALCIUM 600+D3) 600-200 MG-UNIT TABS Take 1 tablet by mouth daily.  . carvedilol (COREG) 25 MG tablet TAKE 1 TABLET BY MOUTH TWICE DAILY WITH MEALS  . Dulaglutide (TRULICITY) 3 MJE/5.6DJSOPN Inject 0.5 mLs (3 mg total) as directed once a week.  . Empagliflozin-metFORMIN HCl ER (SYNJARDY XR) 12.02-999 MG TB24 Take 1 tablet by mouth 2 (two) times daily.  . hydrochlorothiazide (HYDRODIURIL) 25 MG tablet Take 1 tablet (25 mg total) by mouth daily.  .Marland Kitchenlovastatin (MEVACOR) 20 MG tablet Take 2 tablets (40 mg total) by mouth at bedtime.  . meloxicam (MOBIC) 15 MG tablet Take 1 tablet by mouth once daily  . Multiple Vitamin (MULTIVITAMIN) tablet Take 1 tablet by mouth daily.  .Marland Kitchenomeprazole (PRILOSEC) 20 MG capsule Take 20 mg by mouth daily.  . traZODone (DESYREL) 50 MG tablet TAKE 1 TABLET BY MOUTH AT BEDTIME  .  tiZANidine (ZANAFLEX) 4 MG tablet Take 1 tablet (4 mg total) by mouth every 6 (six) hours as needed for muscle spasms. (Patient not taking: Reported on 09/04/2020)   No facility-administered encounter medications on file as of 09/04/2020.    Wt Readings from Last 3 Encounters:  08/01/20 180 lb (81.6 kg)  06/02/20 185 lb 9.6 oz (84.2 kg)  03/01/20 189 lb 12.8 oz (86.1 kg)    Current Diagnosis/Assessment:    Goals Addressed            This Visit's Progress   . Pharmacy care plan       CARE PLAN ENTRY (see longitudinal plan of care for additional care plan information)  Current Barriers:  . Chronic Disease Management support, education, and care coordination needs related to Hypertension, Hyperlipidemia, Diabetes, GERD, and Chronic Kidney Disease   Hypertension BP Readings from Last 3 Encounters:  08/01/20 110/73  06/02/20 127/73  03/01/20 114/72   . Pharmacist Clinical Goal(s): o Over the next 90 days, patient will work with PharmD and providers to maintain BP goal <130/80 . Current regimen:  . Benazepril 40 mg qd . Carvedilol 25 mg bid . hctz 25 mg qd . Interventions: o Provided diet and exercise counseling. o Reviewed proper BP technique including sitting down for at least 5 minutes before, resting calmly, with your feet flat on the floor.   . Patient self care activities - Over the next 90 days, patient will: o Check BP 1-2 times weekly,  document, and provide at future appointments o Ensure daily salt intake < 2300 mg/day  Hyperlipidemia Lab Results  Component Value Date/Time   LDLCALC 74 06/02/2020 03:55 PM   . Pharmacist Clinical Goal(s): o Over the next 90 days, patient will work with PharmD and providers to achieve LDL goal < 70 . Current regimen:  o Lovastatin 4m x2  daily  . Interventions: o Provided diet and exercise counseling.  . Patient self care activities - Over the next 90 days, patient will: o Focus on diet and increasing exercise to 30  min/day 5 days/week.  Diabetes Lab Results  Component Value Date/Time   HGBA1C 8.2 (H) 06/02/2020 03:45 PM   HGBA1C 6.4 03/01/2020 03:33 PM   HGBA1C 7.8 06/12/2016 12:00 AM   . Pharmacist Clinical Goal(s): o Over the next 90 days, patient will work with PharmD and providers to achieve A1c goal <7% . Current regimen:  . Trulicity 3 mg q week  . Synjardy XR 12.562m1000mg  bid . Interventions: o Provided diet and exercise counseling. o Reviewed fill dates in computer and dosing instructions with patient. o Reviewed goal glucose readings for an A1c of <7%, we want to see fasting sugars <130 and 2 hour after meal sugars <180.  . Marland Kitchenatient self care activities - Over the next 90 days, patient will: o Check blood sugar twice daily, document, and provide at future appointments o Contact provider with any episodes of hypoglycemia   Medication management . Pharmacist Clinical Goal(s): o Over the next 90 days, patient will work with PharmD and providers to achieve optimal medication adherence . Current pharmacy: [facility]  . Interventions o Comprehensive medication review performed. o Continue current medication management strategy . Patient self care activities - Over the next 90 days, patient will: o Focus on medication adherence by fill dates o Take medications as prescribed o Report any questions or concerns to PharmD and/or provider(s)  Initial goal documentation        Diabetes   A1c goal <7%  Recent Relevant Labs: Lab Results  Component Value Date/Time   HGBA1C 8.2 (H) 06/02/2020 03:45 PM   HGBA1C 6.4 03/01/2020 03:33 PM   HGBA1C 7.8 06/12/2016 12:00 AM   MICROALBUR 30 (H) 11/30/2019 03:00 PM   MICROALBUR 30 (H) 06/12/2016 01:37 PM    Last diabetic Eye exam: No results found for: HMDIABEYEEXA  Last diabetic Foot exam: No results found for: HMDIABFOOTEX   Checking BG: Daily  Recent FBG Readings: 130-150   Patient has failed these meds in past: NA Patient is  currently query controlled on the following medications: . Trulicity 3 mg q week last fill 10/08 . Synjardy XR 12.39m63m000mg (empagliflozin/metformin) bid (last fill date in computer 07/09/20)  We discussed: Patient denies any missed doses of medications. States she still has some Synjardy and will pick up her Trulicity refill. She denies any current issues with copayments of medications. She now has [employer]et[employer] HannifinDenies any episoded of hypoglycemia. Reviewed goal glucose readings for an A1c of <7%, we want to see fasting sugars <130 and 2 hour after meal sugars <180. Encouraged patient to check post prandial readings occasionally. 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 Patient is aware of appointment tomorrow with Jolene. She weighs 177 lbs on the scales at her work.   Plan  Continue current medications and control with diet and exercise  Hypertension/ CKD   BP goal is:  <130/80  Office blood pressures are  BP Readings from Last 3 Encounters:  08/01/20 110/73  06/02/20 127/73  03/01/20 114/72   BMP Latest Ref Rng & Units 06/02/2020 03/01/2020 11/30/2019  Glucose 65 - 99 mg/dL 126(H) 65 144(H)  BUN 8 - 27 mg/dL 25 27 23   Creatinine 0.57 - 1.00 mg/dL 1.10(H) 1.27(H) 1.12(H)  BUN/Creat Ratio 12 - 28 23 21 21   Sodium 134 - 144 mmol/L 142 142 142  Potassium 3.5 - 5.2 mmol/L 4.1 4.2 3.9  Chloride 96 - 106 mmol/L 102 102 103  CO2 20 - 29 mmol/L 24 25 23   Calcium 8.7 - 10.3 mg/dL 9.4 9.5 9.2    Patient checks BP at home when feeling symptomatic Patient home BP readings are ranging: Unable to recall  Patient has failed these meds in the past: NA Patient is currently controlled on the following medications:  . Benazepril 40 mg qd . Carvedilol 25 mg bid . hctz 25 mg qd  We discussed diet and exercise extensively  Plan  Continue current medications     Hyperlipidemia   LDL goal < 70  Last lipids Lab Results  Component Value Date   CHOL  154 06/02/2020   HDL 50 06/02/2020   LDLCALC 74 06/02/2020   TRIG 179 (H) 06/02/2020   CHOLHDL 3.1 08/18/2019   Hepatic Function Latest Ref Rng & Units 08/18/2019 07/22/2018 07/16/2017  Total Protein 6.0 - 8.5 g/dL 6.8 6.6 -  Albumin 3.8 - 4.8 g/dL 4.1 4.1 -  AST 0 - 40 IU/L 25 21 28   ALT 0 - 32 IU/L 22 21 33  Alk Phosphatase 39 - 117 IU/L 77 79 -  Total Bilirubin 0.0 - 1.2 mg/dL 0.3 0.3 -     The 10-year ASCVD risk score Mikey Bussing DC Jr., et al., 2013) is: 9.5%   Values used to calculate the score:     Age: 22 years     Sex: Female     Is Non-Hispanic African American: No     Diabetic: Yes     Tobacco smoker: No     Systolic Blood Pressure: 841 mmHg     Is BP treated: Yes     HDL Cholesterol: 50 mg/dL     Total Cholesterol: 154 mg/dL   Patient has failed these meds in past: NA Patient is currently query  controlled on the following medications:  . Lovastatin 40 mg (20 mg x2 ) qd  We discussed:  Diet to reduce triglycerides. Reviewed recent lab values and goal values.   Plan  Consider changing to high intensity statin if TG and LDL still above goal at next check.    Medication Management   Pt uses [facility] for all medications Pt endorses 90 % compliance   Plan  Continue current medication management strategy    Follow up: 3 month phone visit  Junita Push. Kenton Kingfisher PharmD, [facility] [phone]

## 2020-09-04 NOTE — Patient Instructions (Addendum)
Visit Information  It was a pleasure speaking with you today. Thank you for letting me be part of your clinical team. Please call with any questions or concerns.   Goals Addressed            This Visit's Progress   . Pharmacy care plan       CARE PLAN ENTRY (see longitudinal plan of care for additional care plan information)  Current Barriers:  . Chronic Disease Management support, education, and care coordination needs related to Hypertension, Hyperlipidemia, Diabetes, GERD, and Chronic Kidney Disease   Hypertension BP Readings from Last 3 Encounters:  08/01/20 110/73  06/02/20 127/73  03/01/20 114/72   . Pharmacist Clinical Goal(s): o Over the next 90 days, patient will work with PharmD and providers to maintain BP goal <130/80 . Current regimen:  . Benazepril 40 mg qd . Carvedilol 25 mg bid . hctz 25 mg qd . Interventions: o Provided diet and exercise counseling. o Reviewed proper BP technique including sitting down for at least 5 minutes before, resting calmly, with your feet flat on the floor.   . Patient self care activities - Over the next 90 days, patient will: o Check BP 1-2 times weekly, document, and provide at future appointments o Ensure daily salt intake < 2300 mg/day  Hyperlipidemia Lab Results  Component Value Date/Time   LDLCALC 74 06/02/2020 03:55 PM   . Pharmacist Clinical Goal(s): o Over the next 90 days, patient will work with PharmD and providers to achieve LDL goal < 70 . Current regimen:  o Lovastatin 20mg  x2  daily  . Interventions: o Provided diet and exercise counseling.  . Patient self care activities - Over the next 90 days, patient will: o Focus on diet and increasing exercise to 30 min/day 5 days/week.  Diabetes Lab Results  Component Value Date/Time   HGBA1C 8.2 (H) 06/02/2020 03:45 PM   HGBA1C 6.4 03/01/2020 03:33 PM   HGBA1C 7.8 06/12/2016 12:00 AM   . Pharmacist Clinical Goal(s): o Over the next 90 days, patient will work  with PharmD and providers to achieve A1c goal <7% . Current regimen:  . Trulicity 3 mg q week  . Synjardy XR 12.5mg /1000mg   bid . Interventions: o Provided diet and exercise counseling. o Reviewed fill dates in computer and dosing instructions with patient. o Reviewed goal glucose readings for an A1c of <7%, we want to see fasting sugars <130 and 2 hour after meal sugars <180.  06/14/2016 Patient self care activities - Over the next 90 days, patient will: o Check blood sugar twice daily, document, and provide at future appointments o Contact provider with any episodes of hypoglycemia   Medication management . Pharmacist Clinical Goal(s): o Over the next 90 days, patient will work with PharmD and providers to achieve optimal medication adherence . Current pharmacy: [facility]  . Interventions o Comprehensive medication review performed. o Continue current medication management strategy . Patient self care activities - Over the next 90 days, patient will: o Focus on medication adherence by fill dates o Take medications as prescribed o Report any questions or concerns to PharmD and/or provider(s)  Initial goal documentation        The patient verbalized understanding of instructions, educational materials, and care plan provided today and agreed to receive a mailed copy of patient instructions, educational materials, and care plan.   Telephone follow up appointment with pharmacy team member scheduled for: 3 months  Marland Kitchen. Mercer Pod PharmD, BCPS Clinical Pharmacist [phone]  Diabetes Mellitus and Nutrition, Adult When you have diabetes (diabetes mellitus), it is very important to have healthy eating habits because your blood sugar (glucose) levels are greatly affected by what you eat and drink. Eating healthy foods in the appropriate amounts, at about the same times every day, can help you:  Control your blood glucose.  Lower your risk of heart disease.  Improve your blood  pressure.  Reach or maintain a healthy weight. Every person with diabetes is different, and each person has different needs for a meal plan. Your health care provider may recommend that you work with a diet and nutrition specialist (dietitian) to make a meal plan that is best for you. Your meal plan may vary depending on factors such as:  The calories you need.  The medicines you take.  Your weight.  Your blood glucose, blood pressure, and cholesterol levels.  Your activity level.  Other health conditions you have, such as heart or kidney disease. How do carbohydrates affect me? Carbohydrates, also called carbs, affect your blood glucose level more than any other type of food. Eating carbs naturally raises the amount of glucose in your blood. Carb counting is a method for keeping track of how many carbs you eat. Counting carbs is important to keep your blood glucose at a healthy level, especially if you use insulin or take certain oral diabetes medicines. It is important to know how many carbs you can safely have in each meal. This is different for every person. Your dietitian can help you calculate how many carbs you should have at each meal and for each snack. Foods that contain carbs include:  Bread, cereal, rice, pasta, and crackers.  Potatoes and corn.  Peas, beans, and lentils.  Milk and yogurt.  Fruit and juice.  Desserts, such as cakes, cookies, ice cream, and candy. How does alcohol affect me? Alcohol can cause a sudden decrease in blood glucose (hypoglycemia), especially if you use insulin or take certain oral diabetes medicines. Hypoglycemia can be a life-threatening condition. Symptoms of hypoglycemia (sleepiness, dizziness, and confusion) are similar to symptoms of having too much alcohol. If your health care provider says that alcohol is safe for you, follow these guidelines:  Limit alcohol intake to no more than 1 drink per day for nonpregnant women and 2 drinks per  day for men. One drink equals 12 oz of beer, 5 oz of wine, or 1 oz of hard liquor.  Do not drink on an empty stomach.  Keep yourself hydrated with water, diet soda, or unsweetened iced tea.  Keep in mind that regular soda, juice, and other mixers may contain a lot of sugar and must be counted as carbs. What are tips for following this plan?  Reading food labels  Start by checking the serving size on the "Nutrition Facts" label of packaged foods and drinks. The amount of calories, carbs, fats, and other nutrients listed on the label is based on one serving of the item. Many items contain more than one serving per package.  Check the total grams (g) of carbs in one serving. You can calculate the number of servings of carbs in one serving by dividing the total carbs by 15. For example, if a food has 30 g of total carbs, it would be equal to 2 servings of carbs.  Check the number of grams (g) of saturated and trans fats in one serving. Choose foods that have low or no amount of these fats.  Check the number  of milligrams (mg) of salt (sodium) in one serving. Most people should limit total sodium intake to less than 2,300 mg per day.  Always check the nutrition information of foods labeled as "low-fat" or "nonfat". These foods may be higher in added sugar or refined carbs and should be avoided.  Talk to your dietitian to identify your daily goals for nutrients listed on the label. Shopping  Avoid buying canned, premade, or processed foods. These foods tend to be high in fat, sodium, and added sugar.  Shop around the outside edge of the grocery store. This includes fresh fruits and vegetables, bulk grains, fresh meats, and fresh dairy. Cooking  Use low-heat cooking methods, such as baking, instead of high-heat cooking methods like deep frying.  Cook using healthy oils, such as olive, canola, or sunflower oil.  Avoid cooking with butter, cream, or high-fat meats. Meal planning  Eat  meals and snacks regularly, preferably at the same times every day. Avoid going long periods of time without eating.  Eat foods high in fiber, such as fresh fruits, vegetables, beans, and whole grains. Talk to your dietitian about how many servings of carbs you can eat at each meal.  Eat 4-6 ounces (oz) of lean protein each day, such as lean meat, chicken, fish, eggs, or tofu. One oz of lean protein is equal to: ? 1 oz of meat, chicken, or fish. ? 1 egg. ?  cup of tofu.  Eat some foods each day that contain healthy fats, such as avocado, nuts, seeds, and fish. Lifestyle  Check your blood glucose regularly.  Exercise regularly as told by your health care provider. This may include: ? 150 minutes of moderate-intensity or vigorous-intensity exercise each week. This could be brisk walking, biking, or water aerobics. ? Stretching and doing strength exercises, such as yoga or weightlifting, at least 2 times a week.  Take medicines as told by your health care provider.  Do not use any products that contain nicotine or tobacco, such as cigarettes and e-cigarettes. If you need help quitting, ask your health care provider.  Work with a Veterinary surgeon or diabetes educator to identify strategies to manage stress and any emotional and social challenges. Questions to ask a health care provider  Do I need to meet with a diabetes educator?  Do I need to meet with a dietitian?  What number can I call if I have questions?  When are the best times to check my blood glucose? Where to find more information:  American Diabetes Association: diabetes.org  Academy of Nutrition and Dietetics: www.eatright.[employer] of Diabetes and Digestive and Kidney Diseases (NIH): CarFlippers.tn Summary  A healthy meal plan will help you control your blood glucose and maintain a healthy lifestyle.  Working with a diet and nutrition specialist (dietitian) can help you make a meal plan that is best for  you.  Keep in mind that carbohydrates (carbs) and alcohol have immediate effects on your blood glucose levels. It is important to count carbs and to use alcohol carefully. This information is not intended to replace advice given to you by your health care provider. Make sure you discuss any questions you have with your health care provider. Document Revised: 09/19/2017 Document Reviewed: 11/11/2016 Elsevier Patient Education  2020 [employer].

## 2020-09-05 ENCOUNTER — Ambulatory Visit (INDEPENDENT_AMBULATORY_CARE_PROVIDER_SITE_OTHER): Payer: Medicare HMO | Admitting: Nurse Practitioner

## 2020-09-05 ENCOUNTER — Encounter: Payer: Self-pay | Admitting: Nurse Practitioner

## 2020-09-05 ENCOUNTER — Other Ambulatory Visit: Payer: Self-pay

## 2020-09-05 VITALS — BP 120/76 | HR 73 | Temp 97.7°F | Wt 177.2 lb

## 2020-09-05 DIAGNOSIS — E1121 Type 2 diabetes mellitus with diabetic nephropathy: Secondary | ICD-10-CM | POA: Diagnosis not present

## 2020-09-05 DIAGNOSIS — E1159 Type 2 diabetes mellitus with other circulatory complications: Secondary | ICD-10-CM | POA: Diagnosis not present

## 2020-09-05 DIAGNOSIS — N183 Chronic kidney disease, stage 3 unspecified: Secondary | ICD-10-CM | POA: Diagnosis not present

## 2020-09-05 DIAGNOSIS — I152 Hypertension secondary to endocrine disorders: Secondary | ICD-10-CM | POA: Diagnosis not present

## 2020-09-05 DIAGNOSIS — E785 Hyperlipidemia, unspecified: Secondary | ICD-10-CM | POA: Diagnosis not present

## 2020-09-05 DIAGNOSIS — E6609 Other obesity due to excess calories: Secondary | ICD-10-CM | POA: Diagnosis not present

## 2020-09-05 DIAGNOSIS — Z6832 Body mass index (BMI) 32.0-32.9, adult: Secondary | ICD-10-CM | POA: Diagnosis not present

## 2020-09-05 DIAGNOSIS — E1169 Type 2 diabetes mellitus with other specified complication: Secondary | ICD-10-CM | POA: Diagnosis not present

## 2020-09-05 DIAGNOSIS — E1122 Type 2 diabetes mellitus with diabetic chronic kidney disease: Secondary | ICD-10-CM

## 2020-09-05 MED ORDER — LOVASTATIN 20 MG PO TABS: 40.0000 mg | ORAL_TABLET | Freq: Every day | ORAL | 4 refills | Status: DC

## 2020-09-05 MED ORDER — BENAZEPRIL HCL 40 MG PO TABS: 40.0000 mg | ORAL_TABLET | Freq: Every day | ORAL | 4 refills | Status: DC

## 2020-09-05 MED ORDER — SYNJARDY XR 12.5-1000 MG PO TB24: 1.0000 | ORAL_TABLET | Freq: Two times a day (BID) | ORAL | 4 refills | Status: DC

## 2020-09-05 MED ORDER — TRAZODONE HCL 50 MG PO TABS
50.0000 mg | ORAL_TABLET | Freq: Every day | ORAL | 4 refills | Status: DC
Start: 2020-09-05 — End: 2021-09-20

## 2020-09-05 MED ORDER — CARVEDILOL 25 MG PO TABS: 25.0000 mg | ORAL_TABLET | Freq: Two times a day (BID) | ORAL | 4 refills | Status: DC

## 2020-09-05 MED ORDER — HYDROCHLOROTHIAZIDE 25 MG PO TABS: 25.0000 mg | ORAL_TABLET | Freq: Every day | ORAL | 4 refills | Status: DC

## 2020-09-05 NOTE — Progress Notes (Signed)
BP 120/76   Pulse 73   Temp 97.7 F (36.5 C)   Wt 177 lb 3.2 oz (80.4 kg)   SpO2 99%   BMI 32.94 kg/m    Subjective:    Patient ID: Amanda [name], female    DOB: 1956/05/16, 64 y.o.   MRN: [id number]  HPI: Amanda [name] is a 64 y.o. female  Chief Complaint  Patient presents with  . Diabetes   DIABETES Last [facility] in August was 8.2%.  Continues on [place] and Trulicity -- increased to 3 MG weekly in August.  No ADR with this.  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   Hypoglycemic episodes: none, since stopping Glimepiride Polydipsia/polyuria: no Visual disturbance: no Chest pain: no Paresthesias: no Glucose Monitoring: no             Accucheck frequency: rarely, only when not feeling good             Fasting glucose: 126 - 175             Post prandial: 5 hours after breakfast this morning was 141             Evening:              Before meals: Taking Insulin?: no             Long acting insulin:             Short acting insulin: Blood Pressure Monitoring: rarely Retinal Examination: Not Up To Date Foot Exam: Up to Date Pneumovax: Up to Date Influenza: Up to Date Aspirin: yes   HYPERTENSION / HYPERLIPIDEMIA Continues on HCTZ, Carvedilol, Benazepril, and Lovastatin + ASA.  August LDL was 74. Satisfied with current treatment?yes Duration of hypertension:chronic BP monitoring frequency:rarely BP range: 120/70's BP medication side effects:no Duration of hyperlipidemia:chronic Cholesterol medication side effects:no Cholesterol supplements: none Medication compliance:good compliance Aspirin:yes Recent stressors:no Recurrent headaches:no Visual changes:no Palpitations:no Dyspnea:no Chest pain:no Lower extremity edema:no Dizzy/lightheaded:no  CHRONIC KIDNEY DISEASE August labs showed GFR 53 and CRT 1.10. CKD status:stable Medications renally dose:yes Previous  renal evaluation:no Pneumovax:Up to Date Influenza Vaccine:Up to Date  Relevant past medical, surgical, family and social history reviewed and updated as indicated. Interim medical history since our last visit reviewed. Allergies and medications reviewed and updated.  Review of Systems  Constitutional: Negative for activity change, appetite change, diaphoresis, fatigue and fever.  Respiratory: Negative for cough, chest tightness and shortness of breath.   Cardiovascular: Negative for chest pain, palpitations and leg swelling.  Gastrointestinal: Negative.   Endocrine: Negative for cold intolerance, heat intolerance, polydipsia, polyphagia and polyuria.  Neurological: Negative.   Psychiatric/Behavioral: Negative.     Per HPI unless specifically indicated above     Objective:    BP 120/76   Pulse 73   Temp 97.7 F (36.5 C)   Wt 177 lb 3.2 oz (80.4 kg)   SpO2 99%   BMI 32.94 kg/m   Wt Readings from Last 3 Encounters:  09/05/20 177 lb 3.2 oz (80.4 kg)  08/01/20 180 lb (81.6 kg)  06/02/20 185 lb 9.6 oz (84.2 kg)    Physical Exam Vitals and nursing note reviewed.  Constitutional:      General: She is awake. She is not in acute distress.    Appearance: She is well-developed. She is obese. She is not ill-appearing.  HENT:     Head: Normocephalic.  Right Ear: Hearing normal.     Left Ear: Hearing normal.     Nose: Nose normal.     Mouth/Throat:     Mouth: Mucous membranes are moist.  Eyes:     General: Lids are normal.        Right eye: No discharge.        Left eye: No discharge.     Conjunctiva/sclera: Conjunctivae normal.     Pupils: Pupils are equal, round, and reactive to light.  Neck:     Thyroid: No thyromegaly.     Vascular: No carotid bruit or JVD.  Cardiovascular:     Rate and Rhythm: Normal rate and regular rhythm.     Heart sounds: Normal heart sounds. No murmur heard.  No gallop.   Pulmonary:     Effort: Pulmonary effort is normal.     Breath  sounds: Normal breath sounds.  Abdominal:     General: Bowel sounds are normal.     Palpations: Abdomen is soft.  Musculoskeletal:     Cervical back: Normal range of motion and neck supple.     Right lower leg: No edema.     Left lower leg: No edema.  Lymphadenopathy:     Cervical: No cervical adenopathy.  Skin:    General: Skin is warm and dry.  Neurological:     Mental Status: She is alert and oriented to person, place, and time.  Psychiatric:        Attention and Perception: Attention normal.        Mood and Affect: Mood normal.        Behavior: Behavior normal. Behavior is cooperative.        Thought Content: Thought content normal.        Judgment: Judgment normal.    Results for orders placed or performed in visit on 06/02/20  Bayer DCA Hb A1c Waived  Result Value Ref Range   HB A1C (BAYER DCA - WAIVED) 8.2 (H) <7.0 %  Basic metabolic panel  Result Value Ref Range   Glucose 126 (H) 65 - 99 mg/dL   BUN 25 8 - 27 mg/dL   Creatinine, Ser 9.37 (H) 0.57 - 1.00 mg/dL   GFR calc non Af Amer 53 (L) >59 mL/min/1.73   GFR calc Af Amer 61 >59 mL/min/1.73   BUN/Creatinine Ratio 23 12 - 28   Sodium 142 134 - 144 mmol/L   Potassium 4.1 3.5 - 5.2 mmol/L   Chloride 102 96 - 106 mmol/L   CO2 24 20 - 29 mmol/L   Calcium 9.4 8.7 - 10.3 mg/dL  Lipid Panel w/o Chol/HDL Ratio  Result Value Ref Range   Cholesterol, Total 154 100 - 199 mg/dL   Triglycerides 169 (H) 0 - 149 mg/dL   HDL 50 >67 mg/dL   VLDL Cholesterol Cal 30 5 - 40 mg/dL   LDL Chol Calc (NIH) 74 0 - 99 mg/dL      Assessment & Plan:   Problem List Items Addressed This Visit      Cardiovascular and Mediastinum   Hypertension associated with diabetes (HCC)    Chronic, ongoing with BP below goal.  Continue current medication regimen and adjust as needed.  Lotensin for kidney protection.  BMP today.  Recommend she monitor BP at home a few days a week and document for provider.  Could consider reduction of BP medications  in future if ongoing below goal BP.  DASH diet recommended.  Return in 3 months.  Refills sent in.      Relevant Medications   benazepril (LOTENSIN) 40 MG tablet   lovastatin (MEVACOR) 20 MG tablet   hydrochlorothiazide (HYDRODIURIL) 25 MG tablet   Empagliflozin-metFORMIN HCl ER (SYNJARDY XR) 12.02-999 MG TB24   carvedilol (COREG) 25 MG tablet   Other Relevant Orders   Bayer DCA Hb A1c Waived   Basic metabolic panel   TSH     Endocrine   CKD stage 3 due to type 2 diabetes mellitus (HCC)    Chronic, ongoing with BP at goal, A1C trending down at 6.8%.  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  Urine ALB 30 and A:C <30 last visit, BMP and TSH today.  Return in 3 months.      Relevant Medications   benazepril (LOTENSIN) 40 MG tablet   lovastatin (MEVACOR) 20 MG tablet   Empagliflozin-metFORMIN HCl ER (SYNJARDY XR) 12.02-999 MG TB24   Other Relevant Orders   Bayer DCA Hb A1c Waived   Basic metabolic panel   Hyperlipidemia associated with type 2 diabetes mellitus (HCC)    Chronic, ongoing.  Continue current medication regimen and adjust as needed.  Lipid panel today.  Refills sent.      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 Continue Synjardy at current dose and Trulicity 3 MG, could increase Trulicity further if needed upcoming -- discussed with patient and she is to notify PharmD or provider if consistent fasting BS >130 at home.  Discussed this plan at length with her.   Continue to monitor BS at home daily, in morning. Plan to see her back in 3 months for annual  physical.  Refills sent in.      Relevant Medications   benazepril (LOTENSIN) 40 MG tablet   lovastatin (MEVACOR) 20 MG tablet   Empagliflozin-metFORMIN HCl ER (SYNJARDY XR) 12.02-999 MG TB24   Other Relevant Orders   Bayer DCA Hb A1c Waived     Other   Obesity    BMI 32.94 with almost 10 pounds of weight loss.  Praised for this.  Recommended eating smaller high protein, low fat meals more frequently and exercising 30 mins a day 5 times a week with a goal of 10-15lb weight loss in the next 3 months. Patient voiced their understanding and motivation to adhere to these recommendations.       Relevant Medications   Empagliflozin-metFORMIN HCl ER (SYNJARDY XR) 12.02-999 MG TB24       Follow up plan: Return in about 3 months (around 12/06/2020) for Annual physical exam and diabetes check.

## 2020-09-05 NOTE — Assessment & Plan Note (Signed)
Chronic, ongoing with A1C 6.8% today, trending down, and recent urine ALB 30 and A:C <30. Continue Synjardy at current dose and Trulicity 3 MG, could increase Trulicity further if needed upcoming -- discussed with patient and she is to notify PharmD or provider if consistent fasting BS >130 at home.  Discussed this plan at length with her.   Continue to monitor BS at home daily, in morning. Plan to see her back in 3 months for annual physical.  Refills sent in.

## 2020-09-05 NOTE — Assessment & Plan Note (Signed)
BMI 32.94 with almost 10 pounds of weight loss.  Praised for this.  Recommended eating smaller high protein, low fat meals more frequently and exercising 30 mins a day 5 times a week with a goal of 10-15lb weight loss in the next 3 months. Patient voiced their understanding and motivation to adhere to these recommendations.

## 2020-09-05 NOTE — Patient Instructions (Signed)

## 2020-09-05 NOTE — Assessment & Plan Note (Signed)
Chronic, ongoing.  Continue current medication regimen and adjust as needed.  Lipid panel today.  Refills sent.   

## 2020-09-05 NOTE — Assessment & Plan Note (Signed)
Chronic, ongoing with BP at goal, A1C trending down at 6.8%.  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  Urine ALB 30 and A:C <30 last visit, BMP and TSH today.  Return in 3 months.

## 2020-09-05 NOTE — Assessment & Plan Note (Addendum)
Chronic, ongoing with BP below goal.  Continue current medication regimen and adjust as needed.  Lotensin for kidney protection.  BMP today.  Recommend she monitor BP at home a few days a week and document for provider.  Could consider reduction of BP medications in future if ongoing below goal BP.  DASH diet recommended.  Return in 3 months.  Refills sent in.

## 2020-09-06 LAB — LIPID PANEL W/O CHOL/HDL RATIO
Cholesterol, Total: 146 mg/dL (ref 100–199)
HDL: 50 mg/dL (ref >39)
LDL Chol Calc (NIH): 69 mg/dL (ref 0–99)
Triglycerides: 156 mg/dL — ABNORMAL HIGH (ref 0–149)
VLDL Cholesterol Cal: 27 mg/dL (ref 5–40)

## 2020-09-06 LAB — BASIC METABOLIC PANEL
BUN/Creatinine Ratio: 20 (ref 12–28)
BUN: 23 mg/dL (ref 8–27)
CO2: 23 mmol/L (ref 20–29)
Calcium: 9.8 mg/dL (ref 8.7–10.3)
Chloride: 104 mmol/L (ref 96–106)
Creatinine, Ser: 1.13 mg/dL — ABNORMAL HIGH (ref 0.57–1.00)
GFR calc Af Amer: 59 mL/min/{1.73_m2} — ABNORMAL LOW (ref >59)
GFR calc non Af Amer: 51 mL/min/{1.73_m2} — ABNORMAL LOW (ref >59)
Glucose: 113 mg/dL — ABNORMAL HIGH (ref 65–99)
Potassium: 4 mmol/L (ref 3.5–5.2)
Sodium: 143 mmol/L (ref 134–144)

## 2020-09-06 LAB — BAYER DCA HB A1C WAIVED: HB A1C (BAYER DCA - WAIVED): 6.8 % (ref <7.0)

## 2020-09-06 LAB — TSH: TSH: 2.13 u[IU]/mL (ref 0.450–4.500)

## 2020-09-06 NOTE — Progress Notes (Signed)
Good morning, please let Amanda [name] know her labs have returned and she continues to show some mild kidney disease, but no worsening.  We will continue to monitor.  Thyroid is normal and cholesterol levels are at goal, except for triglycerides which remain mildly elevated.  Continue all current medications.  If any questions let me know. Keep being awesome!!  Thank you for allowing me to participate in your care. Kindest regards, Jammy Plotkin

## 2020-09-15 ENCOUNTER — Other Ambulatory Visit: Payer: Self-pay | Admitting: Family Medicine

## 2020-09-15 ENCOUNTER — Other Ambulatory Visit: Payer: Self-pay | Admitting: Internal Medicine

## 2020-09-15 NOTE — Telephone Encounter (Signed)
PT need a refill  meloxicam (MOBIC) 15 MG tablet [[id number]]  [facility] [address], [place] - [address]  [address] [place] [address]  Phone: [phone] Fax: [phone]

## 2020-09-15 NOTE — Telephone Encounter (Signed)
° °  Notes to clinic:  medication was filled by a different provider  Review for refill   Requested Prescriptions  Pending Prescriptions Disp Refills   meloxicam (MOBIC) 15 MG tablet 90 tablet 0    Sig: Take 1 tablet (15 mg total) by mouth daily.      Analgesics:  COX2 Inhibitors Failed - 09/15/2020 10:12 AM      Failed - HGB in normal range and within 360 days    Hemoglobin  Date Value Ref Range Status  08/18/2019 11.5 11.1 - 15.9 g/dL Final          Failed - Cr in normal range and within 360 days    Creatinine, Ser  Date Value Ref Range Status  09/05/2020 1.13 (H) 0.57 - 1.00 mg/dL Final          Passed - Patient is not pregnant      Passed - Valid encounter within last 12 months    Recent Outpatient Visits           1 week ago Type 2 diabetes with nephropathy (HCC)   [facility] [place], [name], NP   1 month ago Type 2 diabetes with nephropathy (HCC)   [facility] [place], [name], NP   3 months ago Type 2 diabetes with nephropathy (HCC)   [facility] [place], [name], NP   6 months ago Type 2 diabetes with nephropathy (HCC)   [facility] [place], [name], NP   9 months ago Type 2 diabetes with nephropathy (HCC)   [facility] [place], [name], NP

## 2020-09-18 MED ORDER — MELOXICAM 15 MG PO TABS
15.0000 mg | ORAL_TABLET | Freq: Every day | ORAL | 0 refills | Status: DC
Start: 2020-09-18 — End: 2021-01-02

## 2020-09-18 NOTE — Telephone Encounter (Signed)
Patient is requesting a refill

## 2020-12-06 ENCOUNTER — Ambulatory Visit (INDEPENDENT_AMBULATORY_CARE_PROVIDER_SITE_OTHER): Payer: Medicare HMO | Admitting: Pharmacist

## 2020-12-06 DIAGNOSIS — E1121 Type 2 diabetes mellitus with diabetic nephropathy: Secondary | ICD-10-CM | POA: Diagnosis not present

## 2020-12-06 DIAGNOSIS — N183 Chronic kidney disease, stage 3 unspecified: Secondary | ICD-10-CM

## 2020-12-06 DIAGNOSIS — E1122 Type 2 diabetes mellitus with diabetic chronic kidney disease: Secondary | ICD-10-CM

## 2020-12-06 DIAGNOSIS — E785 Hyperlipidemia, unspecified: Secondary | ICD-10-CM | POA: Diagnosis not present

## 2020-12-06 DIAGNOSIS — E1169 Type 2 diabetes mellitus with other specified complication: Secondary | ICD-10-CM | POA: Diagnosis not present

## 2020-12-06 NOTE — Progress Notes (Signed)
Chronic Care Management Pharmacy Note  12/06/2020 Name:  Amanda [name] MRN:  [id number] DOB:  Jul 05, 1956  Subjective: Amanda [name] is an 65 y.o. year old female who is a primary patient of Vigg, [name], MD.  The CCM team was consulted for assistance with disease management and care coordination needs.    Engaged with patient by telephone for follow up visit in response to provider referral for pharmacy case management and/or care coordination services.   Consent to Services:  The patient was given information about Chronic Care Management services, agreed to services, and gave verbal consent prior to initiation of services.  Please see initial visit note for detailed documentation.   Patient Care Team: Charlynne Cousins, MD as PCP - General Vladimir Faster, [facility] (Pharmacist)  Recent office visits: 09/05/20- Marnee Guarneri, DNP- blood work    Hospital visits: None in previous 6 months  Objective:  Lab Results  Component Value Date   CREATININE 1.13 (H) 09/05/2020   BUN 23 09/05/2020   GFRNONAA 51 (L) 09/05/2020   GFRAA 59 (L) 09/05/2020   NA 143 09/05/2020   K 4.0 09/05/2020   CALCIUM 9.8 09/05/2020   CO2 23 09/05/2020    Lab Results  Component Value Date/Time   HGBA1C 6.8 09/05/2020 04:11 PM   HGBA1C 8.2 (H) 06/02/2020 03:45 PM   HGBA1C 7.8 06/12/2016 12:00 AM   MICROALBUR 30 (H) 11/30/2019 03:00 PM   MICROALBUR 30 (H) 06/12/2016 01:37 PM    Last diabetic Eye exam: No results found for: HMDIABEYEEXA  Last diabetic Foot exam: No results found for: HMDIABFOOTEX   Lab Results  Component Value Date   CHOL 146 09/05/2020   HDL 50 09/05/2020   LDLCALC 69 09/05/2020   TRIG 156 (H) 09/05/2020   CHOLHDL 3.1 08/18/2019    Hepatic Function Latest Ref Rng & Units 08/18/2019 07/22/2018 07/16/2017  Total Protein 6.0 - 8.5 g/dL 6.8 6.6 -  Albumin 3.8 - 4.8 g/dL 4.1 4.1 -  AST 0 - 40 IU/L 25 21 28   ALT 0 - 32 IU/L 22 21 33  Alk Phosphatase 39 - 117 IU/L 77 79 -  Total  Bilirubin 0.0 - 1.2 mg/dL 0.3 0.3 -    Lab Results  Component Value Date/Time   TSH 2.130 09/05/2020 04:15 PM   TSH 2.980 08/18/2019 04:22 PM    CBC Latest Ref Rng & Units 08/18/2019 07/22/2018 01/07/2017  WBC 3.4 - 10.8 x10E3/uL 5.9 6.0 4.9  Hemoglobin 11.1 - 15.9 g/dL 11.5 11.1 11.7  Hematocrit 34.0 - 46.6 % 33.7(L) 32.6(L) 34.4  Platelets 150 - 450 x10E3/uL 204 210 198    No results found for: VD25OH  Clinical ASCVD: No  The 10-year ASCVD risk score Mikey Bussing DC Jr., et al., 2013) is: 10.9%   Values used to calculate the score:     Age: 39 years     Sex: Female     Is Non-Hispanic African American: No     Diabetic: Yes     Tobacco smoker: No     Systolic Blood Pressure: 026 mmHg     Is BP treated: Yes     HDL Cholesterol: 50 mg/dL     Total Cholesterol: 146 mg/dL    Depression screen [place] Day 2/9 08/01/2020 09/21/2019 07/22/2018  Decreased Interest 0 0 1  Down, Depressed, Hopeless 0 0 -  PHQ - 2 Score 0 0 1  Altered sleeping - - 1  Tired, decreased energy - - 1  Change in  appetite - - -  Feeling bad or failure about yourself  - - 0  Trouble concentrating - - 0  Moving slowly or fidgety/restless - - 0  Suicidal thoughts - - 0  PHQ-9 Score - - 3       Social History   Tobacco Use  Smoking Status Former Smoker  . Quit date: 12/07/1975  . Years since quitting: 45.0  Smokeless Tobacco Never Used   BP Readings from Last 3 Encounters:  09/05/20 120/76  08/01/20 110/73  06/02/20 127/73   Pulse Readings from Last 3 Encounters:  09/05/20 73  08/01/20 65  06/02/20 61   Wt Readings from Last 3 Encounters:  09/05/20 177 lb 3.2 oz (80.4 kg)  08/01/20 180 lb (81.6 kg)  06/02/20 185 lb 9.6 oz (84.2 kg)    Assessment/Interventions: Review of patient past medical history, allergies, medications, health status, including review of consultants reports, laboratory and other test data, was performed as part of comprehensive evaluation and provision of chronic care management  services.   SDOH:  (Social Determinants of Health) assessments and interventions performed: No     Immunization History  Administered Date(s) Administered  . Influenza,inj,Quad PF,6+ Mos 09/16/2016, 07/16/2017, 07/22/2018, 08/18/2019, 08/01/2020  . Moderna Sars-Covid-2 Vaccination 12/18/2019, 01/15/2020  . Pneumococcal Polysaccharide-23 10/26/2002  . Tdap 03/16/2013    Conditions to be addressed/monitored:  Hypertension, Hyperlipidemia, Diabetes, GERD and Chronic Kidney Disease  There are no care plans that you recently modified to display for this patient.    Medication Assistance: None required.  Patient affirms current coverage meets needs.  Patient's preferred pharmacy is:  [facility] [address], [place] - [address] [address] Phone: [phone] Fax: [phone]  Uses pill box? Yes Pt endorses 90% compliance  We discussed: Benefits of medication synchronization, packaging and delivery as well as enhanced pharmacist oversight with [employer]. Patient decided to: Continue current medication management strategy  Care Plan and Follow Up Patient Decision:  Patient agrees to Care Plan and Follow-up.  Plan: Telephone follow up appointment with care management team member scheduled for:  3-4 months  Junita Push. Kenton Kingfisher PharmD, [facility] [phone]     Current Barriers:  . Unable to independently monitor therapeutic efficacy . Does not maintain contact with provider office  Pharmacist Clinical Goal(s):  Marland Kitchen Over the next 90 days, patient will  through collaboration with PharmD and provider.    Interventions: . 1:1 collaboration with Charlynne Cousins, MD regarding development and update of comprehensive plan of care as evidenced by provider attestation and co-signature . Inter-disciplinary care team collaboration (see longitudinal plan of care) . Comprehensive  medication review performed; medication list updated in electronic medical record  Osteoporosis / Osteopenia (Prevent fracture) -Uncontrolled -Last DEXA Scan: Never done   -Patient has never had DEXA. Will consider -Current treatment  . Calcium vitamin D -Medications previously tried: na  -Recommend [phone] units of vitamin D daily. Recommend 1200 mg of calcium daily from dietary and supplemental sources. Recommend weight-bearing and muscle strengthening exercises for building and maintaining bone density. -Counseled on diet and exercise extensively Recommended DEXA scan   Current Barriers:  . Chronic Disease Management support, education, and care coordination needs related to Hypertension, Hyperlipidemia, Diabetes, GERD, and Chronic Kidney Disease   Hypertension BP Readings from Last 3 Encounters:  09/05/20 120/76  08/01/20 110/73  06/02/20 127/73   . Pharmacist Clinical Goal(s): o Over the next 90 days, patient will  work with PharmD and providers to maintain BP goal <130/80 . Current regimen:  . Benazepril 40 mg qd . Carvedilol 25 mg bid . hctz 25 mg qd . Interventions: o Provided diet and exercise counseling. o Reviewed proper BP technique including sitting down for at least 5 minutes before, resting calmly, with your feet flat on the floor.   . Patient self care activities - Over the next 90 days, patient will: o Check BP 1-2 times weekly, document, and provide at future appointments o Ensure daily salt intake < 2300 mg/day  Hyperlipidemia Lab Results  Component Value Date/Time   LDLCALC 69 09/05/2020 04:15 PM   . Pharmacist Clinical Goal(s): o Over the next 90 days, patient will work with PharmD and providers to achieve LDL goal < 70 . Current regimen:  o Lovastatin 67m x2  daily  . Interventions: o Provided diet and exercise counseling.  . Patient self care activities - Over the next 90 days, patient will: o Focus on diet and increasing exercise to 30 min/day  5 days/week.  Diabetes Lab Results  Component Value Date/Time   HGBA1C 6.8 09/05/2020 04:11 PM   HGBA1C 8.2 (H) 06/02/2020 03:45 PM   HGBA1C 7.8 06/12/2016 12:00 AM   . Pharmacist Clinical Goal(s): o Over the next 90 days, patient will work with PharmD and providers to achieve A1c goal <7% . Current regimen:  . Trulicity 3 mg q week  . Synjardy XR 12.555m1000mg  bid . Interventions: o Provided diet and exercise counseling. o Reviewed fill dates in computer and dosing instructions with patient. o Reviewed goal glucose readings for an A1c of <7%, we want to see fasting sugars <130 and 2 hour after meal sugars <180.  . Marland Kitchenatient self care activities - Over the next 90 days, patient will: o Check blood sugar twice daily, document, and provide at future appointments o Contact provider with any episodes of hypoglycemia   Medication management . Pharmacist Clinical Goal(s): o Over the next 90 days, patient will work with PharmD and providers to achieve optimal medication adherence . Current pharmacy: [facility]  . Interventions o Comprehensive medication review performed. o Continue current medication management strategy . Patient self care activities - Over the next 90 days, patient will: o Focus on medication adherence by fill dates o Take medications as prescribed o Report any questions or concerns to PharmD and/or provider(s)

## 2020-12-18 NOTE — Patient Instructions (Addendum)
Visit Information  It was a pleasure speaking with you today. Thank you for letting me be part of your clinical team. Please call with any questions or concerns.   Goals Addressed            This Visit's Progress   . Pharmacy care plan       CARE PLAN ENTRY (see longitudinal plan of care for additional care plan information)  Current Barriers:  . Chronic Disease Management support, education, and care coordination needs related to Hypertension, Hyperlipidemia, Diabetes, GERD, and Chronic Kidney Disease   Hypertension BP Readings from Last 3 Encounters:  09/05/20 120/76  08/01/20 110/73  06/02/20 127/73   . Pharmacist Clinical Goal(s): o Over the next 90 days, patient will work with PharmD and providers to maintain BP goal <130/80 . Current regimen:  . Benazepril 40 mg qd . Carvedilol 25 mg bid . hctz 25 mg qd . Interventions: o Provided diet and exercise counseling. o Reviewed proper BP technique including sitting down for at least 5 minutes before, resting calmly, with your feet flat on the floor.   . Patient self care activities - Over the next 90 days, patient will: o Check BP 1-2 times weekly, document, and provide at future appointments o Ensure daily salt intake < 2300 mg/day  Hyperlipidemia Lab Results  Component Value Date/Time   LDLCALC 69 09/05/2020 04:15 PM   . Pharmacist Clinical Goal(s): o Over the next 90 days, patient will work with PharmD and providers to achieve LDL goal < 70 . Current regimen:  o Lovastatin 20mg  x2  daily  . Interventions: o Provided diet and exercise counseling.  . Patient self care activities - Over the next 90 days, patient will: o Focus on diet and increasing exercise to 30 min/day 5 days/week.  Diabetes Lab Results  Component Value Date/Time   HGBA1C 6.8 09/05/2020 04:11 PM   HGBA1C 8.2 (H) 06/02/2020 03:45 PM   HGBA1C 7.8 06/12/2016 12:00 AM   . Pharmacist Clinical Goal(s): o Over the next 90 days, patient will work  with PharmD and providers to achieve A1c goal <7% . Current regimen:  . Trulicity 3 mg q week  . Synjardy XR 12.5mg /1000mg   bid . Interventions: o Provided diet and exercise counseling. o Reviewed fill dates in computer and dosing instructions with patient. o Reviewed goal glucose readings for an A1c of <7%, we want to see fasting sugars <130 and 2 hour after meal sugars <180.  Marland Kitchen Patient self care activities - Over the next 90 days, patient will: o Check blood sugar twice daily, document, and provide at future appointments o Contact provider with any episodes of hypoglycemia   Medication management . Pharmacist Clinical Goal(s): o Over the next 90 days, patient will work with PharmD and providers to achieve optimal medication adherence . Current pharmacy: [facility]  . Interventions o Comprehensive medication review performed. o Continue current medication management strategy . Patient self care activities - Over the next 90 days, patient will: o Focus on medication adherence by fill dates o Take medications as prescribed o Report any questions or concerns to PharmD and/or provider(s)  Please see past updates related to this goal by clicking on the "Past Updates" button in the selected goal         The patient verbalized understanding of instructions, educational materials, and care plan provided today and declined offer to receive copy of patient instructions, educational materials, and care plan.   Telephone follow up appointment with pharmacy  team member scheduled for: 3 months  Mer[place] Pod. Isaiha Asare PharmD, BCPS Clinical Pharmacist [phone]  Diabetes Mellitus and Nutrition, Adult When you have diabetes, or diabetes mellitus, it is very important to have healthy eating habits because your blood sugar (glucose) levels are greatly affected by what you eat and drink. Eating healthy foods in the right amounts, at about the same times every day, can help you:  Control your blood  glucose.  Lower your risk of heart disease.  Improve your blood pressure.  Reach or maintain a healthy weight. What can affect my meal plan? Every person with diabetes is different, and each person has different needs for a meal plan. Your health care provider may recommend that you work with a dietitian to make a meal plan that is best for you. Your meal plan may vary depending on factors such as:  The calories you need.  The medicines you take.  Your weight.  Your blood glucose, blood pressure, and cholesterol levels.  Your activity level.  Other health conditions you have, such as heart or kidney disease. How do carbohydrates affect me? Carbohydrates, also called carbs, affect your blood glucose level more than any other type of food. Eating carbs naturally raises the amount of glucose in your blood. Carb counting is a method for keeping track of how many carbs you eat. Counting carbs is important to keep your blood glucose at a healthy level, especially if you use insulin or take certain oral diabetes medicines. It is important to know how many carbs you can safely have in each meal. This is different for every person. Your dietitian can help you calculate how many carbs you should have at each meal and for each snack. How does alcohol affect me? Alcohol can cause a sudden decrease in blood glucose (hypoglycemia), especially if you use insulin or take certain oral diabetes medicines. Hypoglycemia can be a life-threatening condition. Symptoms of hypoglycemia, such as sleepiness, dizziness, and confusion, are similar to symptoms of having too much alcohol.  Do not drink alcohol if: ? Your health care provider tells you not to drink. ? You are pregnant, may be pregnant, or are planning to become pregnant.  If you drink alcohol: ? Do not drink on an empty stomach. ? Limit how much you use to:  0-1 drink a day for women.  0-2 drinks a day for men. ? Be aware of how much alcohol  is in your drink. In the [place] one drink equals one 12 oz bottle of beer (355 mL), one 5 oz glass of wine (148 mL), or one 1 oz glass of hard liquor (44 mL). ? Keep yourself hydrated with water, diet soda, or unsweetened iced tea.  Keep in mind that regular soda, juice, and other mixers may contain a lot of sugar and must be counted as carbs. What are tips for following this plan? Reading food labels  Start by checking the serving size on the "Nutrition Facts" label of packaged foods and drinks. The amount of calories, carbs, fats, and other nutrients listed on the label is based on one serving of the item. Many items contain more than one serving per package.  Check the total grams (g) of carbs in one serving. You can calculate the number of servings of carbs in one serving by dividing the total carbs by 15. For example, if a food has 30 g of total carbs per serving, it would be equal to 2 servings of carbs.  Check the  number of grams (g) of saturated fats and trans fats in one serving. Choose foods that have a low amount or none of these fats.  Check the number of milligrams (mg) of salt (sodium) in one serving. Most people should limit total sodium intake to less than 2,300 mg per day.  Always check the nutrition information of foods labeled as "low-fat" or "nonfat." These foods may be higher in added sugar or refined carbs and should be avoided.  Talk to your dietitian to identify your daily goals for nutrients listed on the label. Shopping  Avoid buying canned, pre-made, or processed foods. These foods tend to be high in fat, sodium, and added sugar.  Shop around the outside edge of the grocery store. This is where you will most often find fresh fruits and vegetables, bulk grains, fresh meats, and fresh dairy. Cooking  Use low-heat cooking methods, such as baking, instead of high-heat cooking methods like deep frying.  Cook using healthy oils, such as olive, canola, or sunflower  oil.  Avoid cooking with butter, cream, or high-fat meats. Meal planning  Eat meals and snacks regularly, preferably at the same times every day. Avoid going long periods of time without eating.  Eat foods that are high in fiber, such as fresh fruits, vegetables, beans, and whole grains. Talk with your dietitian about how many servings of carbs you can eat at each meal.  Eat 4-6 oz (112-168 g) of lean protein each day, such as lean meat, chicken, fish, eggs, or tofu. One ounce (oz) of lean protein is equal to: ? 1 oz (28 g) of meat, chicken, or fish. ? 1 egg. ?  cup (62 g) of tofu.  Eat some foods each day that contain healthy fats, such as avocado, nuts, seeds, and fish.   What foods should I eat? Fruits Berries. Apples. Oranges. Peaches. Apricots. Plums. Grapes. Mango. Papaya. Pomegranate. Kiwi. Cherries. Vegetables Lettuce. Spinach. Leafy greens, including kale, chard, collard greens, and mustard greens. Beets. Cauliflower. Cabbage. Broccoli. Carrots. Green beans. Tomatoes. Peppers. Onions. Cucumbers. Brussels sprouts. Grains Whole grains, such as whole-wheat or whole-grain bread, crackers, tortillas, cereal, and pasta. Unsweetened oatmeal. Quinoa. Brown or wild rice. Meats and other proteins Seafood. Poultry without skin. Lean cuts of poultry and beef. Tofu. Nuts. Seeds. Dairy Low-fat or fat-free dairy products such as milk, yogurt, and cheese. The items listed above may not be a complete list of foods and beverages you can eat. Contact a dietitian for more information. What foods should I avoid? Fruits Fruits canned with syrup. Vegetables Canned vegetables. Frozen vegetables with butter or cream sauce. Grains Refined white flour and flour products such as bread, pasta, snack foods, and cereals. Avoid all processed foods. Meats and other proteins Fatty cuts of meat. Poultry with skin. Breaded or fried meats. Processed meat. Avoid saturated fats. Dairy Full-fat yogurt,  cheese, or milk. Beverages Sweetened drinks, such as soda or iced tea. The items listed above may not be a complete list of foods and beverages you should avoid. Contact a dietitian for more information. Questions to ask a health care provider  Do I need to meet with a diabetes educator?  Do I need to meet with a dietitian?  What number can I call if I have questions?  When are the best times to check my blood glucose? Where to find more information:  American Diabetes Association: diabetes.org  Academy of Nutrition and Dietetics: www.eatright.[employer] of Diabetes and Digestive and Kidney Diseases: CarFlippers.tn  Association of Diabetes Care and Education Specialists: www.diabeteseducator.org Summary  It is important to have healthy eating habits because your blood sugar (glucose) levels are greatly affected by what you eat and drink.  A healthy meal plan will help you control your blood glucose and maintain a healthy lifestyle.  Your health care provider may recommend that you work with a dietitian to make a meal plan that is best for you.  Keep in mind that carbohydrates (carbs) and alcohol have immediate effects on your blood glucose levels. It is important to count carbs and to use alcohol carefully. This information is not intended to replace advice given to you by your health care provider. Make sure you discuss any questions you have with your health care provider. Document Revised: 09/14/2019 Document Reviewed: 09/14/2019 Elsevier Patient Education  2021 [employer].

## 2020-12-25 ENCOUNTER — Encounter: Payer: Self-pay | Admitting: Nurse Practitioner

## 2020-12-25 ENCOUNTER — Other Ambulatory Visit: Payer: Self-pay

## 2020-12-25 ENCOUNTER — Ambulatory Visit (INDEPENDENT_AMBULATORY_CARE_PROVIDER_SITE_OTHER): Payer: Medicare HMO | Admitting: Nurse Practitioner

## 2020-12-25 VITALS — BP 128/68 | HR 73 | Temp 98.9°F | Ht 60.0 in | Wt 171.2 lb

## 2020-12-25 DIAGNOSIS — E1169 Type 2 diabetes mellitus with other specified complication: Secondary | ICD-10-CM

## 2020-12-25 DIAGNOSIS — E1159 Type 2 diabetes mellitus with other circulatory complications: Secondary | ICD-10-CM

## 2020-12-25 DIAGNOSIS — K219 Gastro-esophageal reflux disease without esophagitis: Secondary | ICD-10-CM | POA: Diagnosis not present

## 2020-12-25 DIAGNOSIS — E1121 Type 2 diabetes mellitus with diabetic nephropathy: Secondary | ICD-10-CM | POA: Diagnosis not present

## 2020-12-25 DIAGNOSIS — R69 Illness, unspecified: Secondary | ICD-10-CM | POA: Diagnosis not present

## 2020-12-25 DIAGNOSIS — Z23 Encounter for immunization: Secondary | ICD-10-CM

## 2020-12-25 DIAGNOSIS — E785 Hyperlipidemia, unspecified: Secondary | ICD-10-CM

## 2020-12-25 DIAGNOSIS — Z780 Asymptomatic menopausal state: Secondary | ICD-10-CM

## 2020-12-25 DIAGNOSIS — E538 Deficiency of other specified B group vitamins: Secondary | ICD-10-CM

## 2020-12-25 DIAGNOSIS — E1122 Type 2 diabetes mellitus with diabetic chronic kidney disease: Secondary | ICD-10-CM

## 2020-12-25 DIAGNOSIS — N183 Chronic kidney disease, stage 3 unspecified: Secondary | ICD-10-CM | POA: Diagnosis not present

## 2020-12-25 DIAGNOSIS — Z0000 Encounter for general adult medical examination without abnormal findings: Secondary | ICD-10-CM

## 2020-12-25 DIAGNOSIS — F5101 Primary insomnia: Secondary | ICD-10-CM

## 2020-12-25 DIAGNOSIS — I152 Hypertension secondary to endocrine disorders: Secondary | ICD-10-CM | POA: Diagnosis not present

## 2020-12-25 LAB — BAYER DCA HB A1C WAIVED: HB A1C (BAYER DCA - WAIVED): 7.1 % — ABNORMAL HIGH (ref <7.0)

## 2020-12-25 LAB — MICROALBUMIN, URINE WAIVED
Creatinine, Urine Waived: 100 mg/dL (ref 10–300)
Microalb, Ur Waived: 30 mg/L — ABNORMAL HIGH (ref 0–19)
Microalb/Creat Ratio: <30 mg/g (ref <30)

## 2020-12-25 NOTE — Progress Notes (Signed)
BP 128/68 (BP Location: Left Arm)   Pulse 73   Temp 98.9 F (37.2 C) (Oral)   Ht 5' (1.524 m)   Wt 171 lb 3.2 oz (77.7 kg)   SpO2 99%   BMI 33.44 kg/m    Subjective:    Patient ID: Amanda [name], female    DOB: Jan 30, 1956, 65 y.o.   MRN: [id number]  HPI: SARAHBETH CASHIN is a 65 y.o. female presenting on 12/25/2020 for comprehensive medical examination. Current medical complaints include:none  She currently lives with: husband Menopausal Symptoms: no   DIABETES Last AC in November 6.8%. Continues on [place] and Trulicity -- increased to 3 MG weekly in August 2021. No ADR with this.  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  She has been eating a little more sweets. Hypoglycemic episodes: none, since stopping Glimepiride Polydipsia/polyuria:no Visual disturbance:no Chest pain:no Paresthesias:no Glucose Monitoring:no Accucheck frequency: rarely, only when not feeling good Fasting glucose: 126 - 175 Post prandial: 5 hours after breakfast this morning was 141 Evening:  Before meals: Taking Insulin?:no Long acting insulin: Short acting insulin: Blood Pressure Monitoring:rarely Retinal Examination:Not Up To Date Foot Exam:Up to Date Pneumovax:Up to Date Influenza:Up to Date Aspirin:yes  HYPERTENSION / HYPERLIPIDEMIA Continues on HCTZ, Carvedilol, Benazepril, and Lovastatin + ASA.  August LDL was 69. Satisfied with current treatment?yes Duration of hypertension:chronic BP monitoring frequency:rarely BP range: 120/70's BP medication side effects:no Duration of hyperlipidemia:chronic Cholesterol medication side effects:no Cholesterol supplements: none Medication compliance:good compliance Aspirin:yes Recent stressors:no Recurrent headaches:no Visual  changes:no Palpitations:no Dyspnea:no Chest pain:no Lower extremity edema:no Dizzy/lightheaded:no  CHRONIC KIDNEY DISEASE August labs showed GFR51 and CRT 1.13. CKD status:stable Medications renally dose:yes Previous renal evaluation:no Pneumovax:Up to Date Influenza Vaccine:Up to Date  Depression Screen done today and results listed below:  Depression screen [facility] 2/9 12/25/2020 08/01/2020 09/21/2019 07/22/2018 03/26/2018  Decreased Interest 0 0 0 1 0  Down, Depressed, Hopeless 0 0 0 - 0  PHQ - 2 Score 0 0 0 1 0  Altered sleeping 0 - - 1 -  Tired, decreased energy 1 - - 1 -  Change in appetite 2 - - - -  Feeling bad or failure about yourself  0 - - 0 -  Trouble concentrating 0 - - 0 -  Moving slowly or fidgety/restless 0 - - 0 -  Suicidal thoughts 0 - - 0 -  PHQ-9 Score 3 - - 3 -  Difficult doing work/chores Somewhat difficult - - - -    The patient does not have a history of falls. I did not complete a risk assessment for falls. A plan of care for falls was not documented.   Past Medical History:  Past Medical History:  Diagnosis Date  . Diabetes mellitus without complication (HCC)   . Hyperlipidemia   . Hypertension     Surgical History:  Past Surgical History:  Procedure Laterality Date  . COLONOSCOPY  2012    Medications:  Current Outpatient Medications on File Prior to Visit  Medication Sig  . aspirin 81 MG EC tablet Take 81 mg by mouth daily. Swallow whole.  . benazepril (LOTENSIN) 40 MG tablet Take 1 tablet (40 mg total) by mouth daily.  . Calcium Carb-Cholecalciferol (CALCIUM 600+D3) 600-200 MG-UNIT TABS Take 1 tablet by mouth daily.  . carvedilol (COREG) 25 MG tablet Take 1 tablet (25 mg total) by mouth 2 (two) times daily with a meal.  .  Dulaglutide (TRULICITY) 3 MG/0.5ML SOPN Inject 0.5 mLs (3 mg total) as directed once a week.  . Empagliflozin-metFORMIN HCl ER (SYNJARDY XR) 12.02-999 MG TB24 Take 1 tablet by mouth 2 (two) times daily.  .  hydrochlorothiazide (HYDRODIURIL) 25 MG tablet Take 1 tablet (25 mg total) by mouth daily.  Marland Kitchen. lovastatin (MEVACOR) 20 MG tablet Take 2 tablets (40 mg total) by mouth at bedtime.  . meloxicam (MOBIC) 15 MG tablet Take 1 tablet (15 mg total) by mouth daily.  . Multiple Vitamin (MULTIVITAMIN) tablet Take 1 tablet by mouth daily.  Marland Kitchen. omeprazole (PRILOSEC) 20 MG capsule Take 20 mg by mouth daily.  . traZODone (DESYREL) 50 MG tablet Take 1 tablet (50 mg total) by mouth at bedtime.   No current facility-administered medications on file prior to visit.    Allergies:  Allergies  Allergen Reactions  . Penicillins   . Tramadol Other (See Comments)    "woozy"    Social History:  Social History   Socioeconomic History  . Marital status: Single    Spouse name: Not on file  . Number of children: Not on file  . Years of education: Not on file  . Highest education level: Not on file  Occupational History  . Not on file  Tobacco Use  . Smoking status: Former Smoker    Quit date: 12/07/1975    Years since quitting: 45.0  . Smokeless tobacco: Never Used  Vaping Use  . Vaping Use: Never used  Substance and Sexual Activity  . Alcohol use: No  . Drug use: No  . Sexual activity: Not on file  Other Topics Concern  . Not on file  Social History Narrative  . Not on file   Social Determinants of Health   Financial Resource Strain: Not on file  Food Insecurity: Not on file  Transportation Needs: Not on file  Physical Activity: Not on file  Stress: Not on file  Social Connections: Not on file  Intimate Partner Violence: Not on file   Social History   Tobacco Use  Smoking Status Former Smoker  . Quit date: 12/07/1975  . Years since quitting: 45.0  Smokeless Tobacco Never Used   Social History   Substance and Sexual Activity  Alcohol Use No    Family History:  Family History  Problem Relation Age of Onset  . Hypertension Mother   . Diabetes Mother   . Diabetes Sister   . Heart  disease Sister   . Heart disease Sister   . Cancer Niece     Past medical history, surgical history, medications, allergies, family history and social history reviewed with patient today and changes made to appropriate areas of the chart.   ROS All other ROS negative except what is listed above and in the HPI.      Objective:    BP 128/68 (BP Location: Left Arm)   Pulse 73   Temp 98.9 F (37.2 C) (Oral)   Ht 5' (1.524 m)   Wt 171 lb 3.2 oz (77.7 kg)   SpO2 99%   BMI 33.44 kg/m   Wt Readings from Last 3 Encounters:  12/25/20 171 lb 3.2 oz (77.7 kg)  09/05/20 177 lb 3.2 oz (80.4 kg)  08/01/20 180 lb (81.6 kg)    Physical Exam Vitals and nursing note reviewed.  Constitutional:      General: She is awake. She is not in acute distress.    Appearance: She is well-developed and well-groomed. She is obese. She  is not ill-appearing.  HENT:     Head: Normocephalic and atraumatic.     Right Ear: Hearing, tympanic membrane, ear canal and external ear normal. No drainage.     Left Ear: Hearing, tympanic membrane, ear canal and external ear normal. No drainage.     Nose: Nose normal.     Right Sinus: No maxillary sinus tenderness or frontal sinus tenderness.     Left Sinus: No maxillary sinus tenderness or frontal sinus tenderness.     Mouth/Throat:     Mouth: Mucous membranes are moist.     Pharynx: Oropharynx is clear. Uvula midline. No pharyngeal swelling, oropharyngeal exudate or posterior oropharyngeal erythema.  Eyes:     General: Lids are normal.        Right eye: No discharge.        Left eye: No discharge.     Extraocular Movements: Extraocular movements intact.     Conjunctiva/sclera: Conjunctivae normal.     Pupils: Pupils are equal, round, and reactive to light.     Visual Fields: Right eye visual fields normal and left eye visual fields normal.  Neck:     Thyroid: No thyromegaly.     Vascular: No carotid bruit.     Trachea: Trachea normal.  Cardiovascular:      Rate and Rhythm: Normal rate and regular rhythm.     Heart sounds: Normal heart sounds. No murmur heard. No gallop.   Pulmonary:     Effort: Pulmonary effort is normal. No accessory muscle usage or respiratory distress.     Breath sounds: Normal breath sounds.  Chest:     Comments: Refused breast exam today. Abdominal:     General: Bowel sounds are normal.     Palpations: Abdomen is soft. There is no hepatomegaly or splenomegaly.     Tenderness: There is no abdominal tenderness.  Musculoskeletal:        General: Normal range of motion.     Cervical back: Normal range of motion and neck supple.     Right lower leg: No edema.     Left lower leg: No edema.  Lymphadenopathy:     Head:     Right side of head: No submental, submandibular, tonsillar, preauricular or posterior auricular adenopathy.     Left side of head: No submental, submandibular, tonsillar, preauricular or posterior auricular adenopathy.     Cervical: No cervical adenopathy.  Skin:    General: Skin is warm and dry.     Capillary Refill: Capillary refill takes less than 2 seconds.     Findings: No rash.  Neurological:     Mental Status: She is alert and oriented to person, place, and time.     Cranial Nerves: Cranial nerves are intact.     Gait: Gait is intact.     Deep Tendon Reflexes: Reflexes are normal and symmetric.     Reflex Scores:      Brachioradialis reflexes are 2+ on the right side and 2+ on the left side.      Patellar reflexes are 2+ on the right side and 2+ on the left side. Psychiatric:        Attention and Perception: Attention normal.        Mood and Affect: Mood normal.        Speech: Speech normal.        Behavior: Behavior normal. Behavior is cooperative.        Thought Content: Thought content normal.  Judgment: Judgment normal.    Diabetic Foot Exam - Simple   Simple Foot Form Visual Inspection No deformities, no ulcerations, no other skin breakdown bilaterally: Yes Sensation  Testing Intact to touch and monofilament testing bilaterally: Yes Pulse Check Posterior Tibialis and Dorsalis pulse intact bilaterally: Yes Comments     Results for orders placed or performed in visit on 09/05/20  Bayer DCA Hb A1c Waived  Result Value Ref Range   HB A1C (BAYER DCA - WAIVED) 6.8 <7.0 %  Basic metabolic panel  Result Value Ref Range   Glucose 113 (H) 65 - 99 mg/dL   BUN 23 8 - 27 mg/dL   Creatinine, Ser 4.26 (H) 0.57 - 1.00 mg/dL   GFR calc non Af Amer 51 (L) >59 mL/min/1.73   GFR calc Af Amer 59 (L) >59 mL/min/1.73   BUN/Creatinine Ratio 20 12 - 28   Sodium 143 134 - 144 mmol/L   Potassium 4.0 3.5 - 5.2 mmol/L   Chloride 104 96 - 106 mmol/L   CO2 23 20 - 29 mmol/L   Calcium 9.8 8.7 - 10.3 mg/dL  Lipid Panel w/o Chol/HDL Ratio  Result Value Ref Range   Cholesterol, Total 146 100 - 199 mg/dL   Triglycerides 834 (H) 0 - 149 mg/dL   HDL 50 >19 mg/dL   VLDL Cholesterol Cal 27 5 - 40 mg/dL   LDL Chol Calc (NIH) 69 0 - 99 mg/dL  TSH  Result Value Ref Range   TSH 2.130 0.450 - 4.500 uIU/mL      Assessment & Plan:   Problem List Items Addressed This Visit      Cardiovascular and Mediastinum   Hypertension associated with diabetes (HCC)    Chronic, ongoing with BP below goal.  Continue current medication regimen and adjust as needed.  Lotensin for kidney protection.  CMP today, TSH up to date.  Recommend she monitor BP at home a few days a week and document for provider.  Could consider reduction of BP medications in future if ongoing below goal BP.  DASH diet recommended.  Return in 3 months.  Refills sent in last visit.      Relevant Orders   Bayer DCA Hb A1c Waived   Microalbumin, Urine Waived   Comprehensive metabolic panel     Digestive   GERD (gastroesophageal reflux disease)    Chronic, ongoing.  Continue current medication regimen and adjust as needed.  Check Mag level next visit.        Endocrine   CKD stage 3 due to type 2 diabetes mellitus  (HCC)    Chronic, ongoing with BP at goal, A1C trending up slightly at 7.1% due to dietary indiscretions.  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  Urine ALB 30 and A:C <30 today, CMP today.  Return in 3 months.      Relevant Orders   Microalbumin, Urine Waived   Comprehensive metabolic panel   Hyperlipidemia associated with type 2 diabetes mellitus (HCC)    Chronic, ongoing.  Continue current medication regimen and adjust as needed.  Lipid panel today.  Refills sent last visit.      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 Continue Synjardy  at current dose and Trulicity 3 MG, could increase Trulicity further to 4.5 MG next visit if elevated A1c ongoing -- discussed with patient and she is to notify PharmD or provider if consistent fasting BS >130 at home.  Discussed this plan at length with her.   Continue to monitor BS at home daily, in morning. Plan to see her back in 3 months.      Relevant Orders   Bayer DCA Hb A1c Waived   Microalbumin, Urine Waived     Other   Insomnia    Chronic, stable.  Continue Trazodone and adjust dose as needed.  May consider sleep study in future.  Return in 3 months.       Other Visit Diagnoses    B12 deficiency       History of low levels, check today and start supplement as needed.   Relevant Orders   B12   Postmenopausal estrogen deficiency       DEXA scan ordered.   Relevant Orders   DG Bone Density   Pneumococcal vaccination given       PCV13 provided today.   Relevant Orders   Pneumococcal conjugate vaccine 13-valent   Annual physical exam       Annual labs today and health maintenance reviewe with patient.       Follow up plan: Return in about 3 months  (around 03/27/2021) for T2DM, HTN/HLD, CKD.   LABORATORY TESTING:  - Pap smear: not applicable  IMMUNIZATIONS:   - Tdap: Tetanus vaccination status reviewed: last tetanus booster within 10 years. - Influenza: Up to date - Pneumovax: in 2004 - Prevnar: Administered today - HPV: Not applicable - Zostavax vaccine: Refuses  SCREENING: -Mammogram: Refused  - Colonoscopy: Up to date  -- due in May 2022 - Bone Density: Ordered today  -Hearing Test: Not applicable  -Spirometry: Not applicable   PATIENT COUNSELING:   Advised to take 1 mg of folate supplement per day if capable of pregnancy.   Sexuality: Discussed sexually transmitted diseases, partner selection, use of condoms, avoidance of unintended pregnancy  and contraceptive alternatives.   Advised to avoid cigarette smoking.  I discussed with the patient that most people either abstain from alcohol or drink within safe limits (<=14/week and <=4 drinks/occasion for males, <=7/weeks and <= 3 drinks/occasion for females) and that the risk for alcohol disorders and other health effects rises proportionally with the number of drinks per week and how often a drinker exceeds daily limits.  Discussed cessation/primary prevention of drug use and availability of treatment for abuse.   Diet: Encouraged to adjust caloric intake to maintain  or achieve ideal body weight, to reduce intake of dietary saturated fat and total fat, to limit sodium intake by avoiding high sodium foods and not adding table salt, and to maintain adequate dietary potassium and calcium preferably from fresh fruits, vegetables, and low-fat dairy products.    Stressed the importance of regular exercise  Injury prevention: Discussed safety belts, safety helmets, smoke detector, smoking near bedding or upholstery.   Dental health: Discussed importance of regular tooth brushing, flossing, and dental visits.    NEXT PREVENTATIVE PHYSICAL DUE IN 1 YEAR. Return in about 3  months (around 03/27/2021) for T2DM, HTN/HLD, CKD.

## 2020-12-25 NOTE — Assessment & Plan Note (Signed)
Chronic, stable.  Continue Trazodone and adjust dose as needed.  May consider sleep study in future.  Return in 3 months.

## 2020-12-25 NOTE — Patient Instructions (Signed)
[facility]  Address: [address], [place], [place] [address]  Phone: [phone]   Bone Density Test A bone density test uses a type of X-Scalise to measure the amount of calcium and other minerals in a person's bones. It can measure bone density in the hip and the spine. The test is similar to having a regular X-Walls. This test may also be called:  Bone densitometry.  Bone mineral density test.  Dual-energy X-Rokosz absorptiometry (DEXA). You may have this test to:  Diagnose a condition that causes weak or thin bones (osteoporosis).  Screen you for osteoporosis.  Predict your risk for a broken bone (fracture).  Determine how well your osteoporosis treatment is working. Tell a health care provider about:  Any allergies you have.  All medicines you are taking, including vitamins, herbs, eye drops, creams, and over-the-counter medicines.  Any problems you or family members have had with anesthetic medicines.  Any blood disorders you have.  Any surgeries you have had.  Any medical conditions you have.  Whether you are pregnant or may be pregnant.  Any medical tests you have had within the past 14 days that used contrast material. What are the risks? Generally, this is a safe test. However, it does expose you to a small amount of radiation, which can slightly increase your cancer risk. What happens before the test?  Do not take any calcium supplements within the 24 hours before your test.  You will need to remove all metal jewelry, eyeglasses, removable dental appliances, and any other metal objects on your body. What happens during the test?  You will lie down on an exam table. There will be an X-Shurley generator below you and an imaging device above you.  Other devices, such as boxes or braces, may be used to position your body properly for the scan.  The machine will slowly scan your body. You will need to keep very still while the  machine does the scan.  The images will show up on a screen in the room. Images will be examined by a specialist after your test is finished. The procedure may vary among health care providers and hospitals.   What can I expect after the test? It is up to you to get the results of your test. Ask your health care provider, or the department that is doing the test, when your results will be ready. Summary  A bone density test is an imaging test that uses a type of X-Bradway to measure the amount of calcium and other minerals in your bones.  The test may be used to diagnose or screen you for a condition that causes weak or thin bones (osteoporosis), predict your risk for a broken bone (fracture), or determine how well your osteoporosis treatment is working.  Do not take any calcium supplements within 24 hours before your test.  Ask your health care provider, or the department that is doing the test, when your results will be ready. This information is not intended to replace advice given to you by your health care provider. Make sure you discuss any questions you have with your health care provider. Document Revised: 03/23/2020 Document Reviewed: 03/23/2020 Elsevier Patient Education  2021 Elsevier Inc.  

## 2020-12-25 NOTE — Assessment & Plan Note (Signed)
Chronic, ongoing.  Continue current medication regimen and adjust as needed.  Check Mag level next visit.

## 2020-12-25 NOTE — Assessment & Plan Note (Signed)
Chronic, ongoing.  Continue current medication regimen and adjust as needed.  Lipid panel today.  Refills sent last visit.

## 2020-12-25 NOTE — Assessment & Plan Note (Signed)
Chronic, ongoing with A1C 7.1% today, trending up slightly due to diet indiscretions, and urine ALB 30 and A:C <30. Continue Synjardy at current dose and Trulicity 3 MG, could increase Trulicity further to 4.5 MG next visit if elevated A1c ongoing -- discussed with patient and she is to notify PharmD or provider if consistent fasting BS >130 at home.  Discussed this plan at length with her.   Continue to monitor BS at home daily, in morning. Plan to see her back in 3 months.

## 2020-12-25 NOTE — Assessment & Plan Note (Signed)
Chronic, ongoing with BP below goal.  Continue current medication regimen and adjust as needed.  Lotensin for kidney protection.  CMP today, TSH up to date.  Recommend she monitor BP at home a few days a week and document for provider.  Could consider reduction of BP medications in future if ongoing below goal BP.  DASH diet recommended.  Return in 3 months.  Refills sent in last visit.

## 2020-12-25 NOTE — Assessment & Plan Note (Signed)
Chronic, ongoing with BP at goal, A1C trending up slightly at 7.1% due to dietary indiscretions.  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  Urine ALB 30 and A:C <30 today, CMP today.  Return in 3 months.

## 2020-12-26 ENCOUNTER — Encounter: Payer: Self-pay | Admitting: Nurse Practitioner

## 2020-12-26 DIAGNOSIS — E538 Deficiency of other specified B group vitamins: Secondary | ICD-10-CM | POA: Insufficient documentation

## 2020-12-26 LAB — COMPREHENSIVE METABOLIC PANEL
ALT: 16 IU/L (ref 0–32)
AST: 23 IU/L (ref 0–40)
Albumin/Globulin Ratio: 1.9 (ref 1.2–2.2)
Albumin: 4.5 g/dL (ref 3.8–4.8)
Alkaline Phosphatase: 71 IU/L (ref 44–121)
BUN/Creatinine Ratio: 22 (ref 12–28)
BUN: 22 mg/dL (ref 8–27)
Bilirubin Total: 0.3 mg/dL (ref 0.0–1.2)
CO2: 25 mmol/L (ref 20–29)
Calcium: 9.8 mg/dL (ref 8.7–10.3)
Chloride: 101 mmol/L (ref 96–106)
Creatinine, Ser: 1 mg/dL (ref 0.57–1.00)
Globulin, Total: 2.4 g/dL (ref 1.5–4.5)
Glucose: 110 mg/dL — ABNORMAL HIGH (ref 65–99)
Potassium: 4 mmol/L (ref 3.5–5.2)
Sodium: 145 mmol/L — ABNORMAL HIGH (ref 134–144)
Total Protein: 6.9 g/dL (ref 6.0–8.5)
eGFR: 63 mL/min/{1.73_m2} (ref >59)

## 2020-12-26 LAB — LIPID PANEL W/O CHOL/HDL RATIO
Cholesterol, Total: 154 mg/dL (ref 100–199)
HDL: 53 mg/dL (ref >39)
LDL Chol Calc (NIH): 71 mg/dL (ref 0–99)
Triglycerides: 176 mg/dL — ABNORMAL HIGH (ref 0–149)
VLDL Cholesterol Cal: 30 mg/dL (ref 5–40)

## 2020-12-26 LAB — VITAMIN B12: Vitamin B-12: 393 pg/mL (ref 232–1245)

## 2020-12-26 NOTE — Progress Notes (Signed)
Good morning, please let Amanda [name] know her labs have returned: - Cholesterol levels show LDL at goal, but continued mild elevation in triglycerides.  Continue Lovastatin and focus on diet changes -- increasing fish and decreasing sugar.  - Kidney and liver function are both in normal range at this time, but sodium (salt) slightly elevated.  Please reduce salt intake and increase water intake -- we will recheck this on next labs.   - B12 level is on lower side of normal.  We sometimes see lower levels of this with Metformin use long term, Metformin is in your [place].  I would recommend taking 500 MCG of Vitamin B12 daily, you can obtain this in vitamin section and it is very good for nervous system health -- can help with nerve pain and brain fog.  Any questions? Keep being awesome!!  Thank you for allowing me to participate in your care. Kindest regards, Rober Skeels

## 2020-12-30 ENCOUNTER — Other Ambulatory Visit: Payer: Self-pay | Admitting: Nurse Practitioner

## 2020-12-30 NOTE — Telephone Encounter (Signed)
Requested medication (s) are due for refill today: yes  Requested medication (s) are on the active medication list: yes  Last refill:  09/18/20  Future visit scheduled: yes  Notes to clinic:  overdue blood work (Hgb)   Requested Prescriptions  Pending Prescriptions Disp Refills   meloxicam (MOBIC) 15 MG tablet [Pharmacy Med Name: Meloxicam 15 MG Oral Tablet] 90 tablet 0    Sig: Take 1 tablet by mouth once daily      Analgesics:  COX2 Inhibitors Failed - 12/30/2020  8:13 AM      Failed - HGB in normal range and within 360 days    Hemoglobin  Date Value Ref Range Status  08/18/2019 11.5 11.1 - 15.9 g/dL Final          Passed - Cr in normal range and within 360 days    Creatinine, Ser  Date Value Ref Range Status  12/25/2020 1.00 0.57 - 1.00 mg/dL Final          Passed - Patient is not pregnant      Passed - Valid encounter within last 12 months    Recent Outpatient Visits           5 days ago Type 2 diabetes with nephropathy (HCC)   [facility] [employer], [name], NP   3 months ago Type 2 diabetes with nephropathy (HCC)   [facility] [place], [name], NP   5 months ago Type 2 diabetes with nephropathy (HCC)   [facility] Cannady, [name], NP   7 months ago Type 2 diabetes with nephropathy (HCC)   [facility] Cannady, [name], NP   10 months ago Type 2 diabetes with nephropathy (HCC)   [facility] Cannady, [name], NP       Future Appointments             In 2 months Vigg, [name], MD [name]

## 2021-01-01 NOTE — Telephone Encounter (Signed)
Pt called to check status of this refill, she is completely out of her current supply.

## 2021-01-22 ENCOUNTER — Ambulatory Visit
Admission: RE | Admit: 2021-01-22 | Discharge: 2021-01-22 | Disposition: A | Discharge status: Home / Self Care | Payer: Medicare HMO | Source: Ambulatory Visit | Attending: Nurse Practitioner | Admitting: Nurse Practitioner

## 2021-01-22 ENCOUNTER — Other Ambulatory Visit: Payer: Self-pay

## 2021-01-22 DIAGNOSIS — Z780 Asymptomatic menopausal state: Secondary | ICD-10-CM | POA: Diagnosis not present

## 2021-01-22 NOTE — Progress Notes (Signed)
Please let [name] know her DEXA, bone density, scan returned showing normal findings.  No new medications needed.  This is good news!! Keep being awesome!!  Thank you for allowing me to participate in your care. Kindest regards, Romero Letizia

## 2021-02-01 ENCOUNTER — Other Ambulatory Visit: Payer: Self-pay | Admitting: Nurse Practitioner

## 2021-02-01 ENCOUNTER — Other Ambulatory Visit: Payer: Self-pay | Admitting: Internal Medicine

## 2021-02-01 NOTE — Telephone Encounter (Signed)
Requested Prescriptions  Pending Prescriptions Disp Refills  . meloxicam (MOBIC) 15 MG tablet [Pharmacy Med Name: Meloxicam 15 MG Oral Tablet] 90 tablet 0    Sig: Take 1 tablet by mouth once daily     Analgesics:  COX2 Inhibitors Failed - 02/01/2021  5:40 AM      Failed - HGB in normal range and within 360 days    Hemoglobin  Date Value Ref Range Status  08/18/2019 11.5 11.1 - 15.9 g/dL Final         Passed - Cr in normal range and within 360 days    Creatinine, Ser  Date Value Ref Range Status  12/25/2020 1.00 0.57 - 1.00 mg/dL Final         Passed - Patient is not pregnant      Passed - Valid encounter within last 12 months    Recent Outpatient Visits          1 month ago Type 2 diabetes with nephropathy (HCC)   [facility] Cannady, [name], NP   4 months ago Type 2 diabetes with nephropathy (HCC)   [facility] Cannady, [name], NP   6 months ago Type 2 diabetes with nephropathy (HCC)   [facility] Cannady, [name], NP   8 months ago Type 2 diabetes with nephropathy (HCC)   [facility] Cannady, [name], NP   11 months ago Type 2 diabetes with nephropathy (HCC)   [facility] Cannady, [name], NP      Future Appointments            In 1 month Vigg, [name], MD [name]

## 2021-02-01 NOTE — Telephone Encounter (Signed)
Requested Prescriptions  Pending Prescriptions Disp Refills  . TRULICITY 3 MG/0.5ML SOPN [Pharmacy Med Name: Trulicity 3 MG/0.5ML Subcutaneous Solution Pen-injector] 6 mL 0    Sig: INJECT 0.5MLS AS DIRECTED ONCE A WEEK     Endocrinology:  Diabetes - GLP-1 Receptor Agonists Passed - 02/01/2021  5:38 AM      Passed - HBA1C is between 0 and 7.9 and within 180 days    Hemoglobin A1C  Date Value Ref Range Status  06/12/2016 7.8  Final   HB A1C (BAYER DCA - WAIVED)  Date Value Ref Range Status  12/25/2020 7.1 (H) <7.0 % Final    Comment:                                          Diabetic Adult            <7.0                                       Healthy Adult        4.3 - 5.7                                                           (DCCT/NGSP) American Diabetes Association's Summary of Glycemic Recommendations for Adults with Diabetes: Hemoglobin A1c <7.0%. More stringent glycemic goals (A1c <6.0%) may further reduce complications at the cost of increased risk of hypoglycemia.          Passed - Valid encounter within last 6 months    Recent Outpatient Visits          1 month ago Type 2 diabetes with nephropathy (HCC)   [facility] [place], [name], NP   4 months ago Type 2 diabetes with nephropathy (HCC)   [facility] [place], [name], NP   6 months ago Type 2 diabetes with nephropathy (HCC)   [facility] Cannady, [name], NP   8 months ago Type 2 diabetes with nephropathy (HCC)   [facility] Cannady, [name], NP   11 months ago Type 2 diabetes with nephropathy (HCC)   [facility] Cannady, [name], NP      Future Appointments            In 1 month Vigg, [name], MD [name]

## 2021-02-21 ENCOUNTER — Telehealth: Payer: Self-pay | Admitting: Pharmacist

## 2021-02-21 ENCOUNTER — Ambulatory Visit (INDEPENDENT_AMBULATORY_CARE_PROVIDER_SITE_OTHER): Payer: Medicare HMO | Admitting: Pharmacist

## 2021-02-21 DIAGNOSIS — N183 Chronic kidney disease, stage 3 unspecified: Secondary | ICD-10-CM | POA: Diagnosis not present

## 2021-02-21 DIAGNOSIS — E1122 Type 2 diabetes mellitus with diabetic chronic kidney disease: Secondary | ICD-10-CM

## 2021-02-21 DIAGNOSIS — E1121 Type 2 diabetes mellitus with diabetic nephropathy: Secondary | ICD-10-CM

## 2021-02-21 DIAGNOSIS — I10 Essential (primary) hypertension: Secondary | ICD-10-CM | POA: Diagnosis not present

## 2021-02-21 NOTE — Chronic Care Management (AMB) (Signed)
    Chronic Care Management Pharmacy Assistant   Name: Amanda [name]  MRN: [id number] DOB: 28-Aug-1956  Reason for Encounter: Patient Assistance Application Synjardy/ Trulicity   Medications: Outpatient Encounter Medications as of 02/21/2021  Medication Sig  . aspirin 81 MG EC tablet Take 81 mg by mouth daily. Swallow whole.  . benazepril (LOTENSIN) 40 MG tablet Take 1 tablet (40 mg total) by mouth daily.  . Calcium Carb-Cholecalciferol (CALCIUM 600+D3) 600-200 MG-UNIT TABS Take 1 tablet by mouth daily.  . carvedilol (COREG) 25 MG tablet Take 1 tablet (25 mg total) by mouth 2 (two) times daily with a meal.  . Empagliflozin-metFORMIN HCl ER (SYNJARDY XR) 12.02-999 MG TB24 Take 1 tablet by mouth 2 (two) times daily.  . hydrochlorothiazide (HYDRODIURIL) 25 MG tablet Take 1 tablet (25 mg total) by mouth daily.  Marland Kitchen lovastatin (MEVACOR) 20 MG tablet Take 2 tablets (40 mg total) by mouth at bedtime.  . meloxicam (MOBIC) 15 MG tablet Take 1 tablet by mouth once daily  . Multiple Vitamin (MULTIVITAMIN) tablet Take 1 tablet by mouth daily.  Marland Kitchen omeprazole (PRILOSEC) 20 MG capsule Take 20 mg by mouth daily.  . traZODone (DESYREL) 50 MG tablet Take 1 tablet (50 mg total) by mouth at bedtime.  . TRULICITY 3 MG/0.5ML SOPN INJECT 0.5MLS AS DIRECTED ONCE A WEEK   No facility-administered encounter medications on file as of 02/21/2021.   Prepared and reviewed patient assistance application for Synjardy and Trulicity.   Sent message to patient and informed her that I will sending out his patient assistance applications today 02/23/21. She is aware this needs no be completed and returned to PCPs office.  Purvis Kilts CPA, CMA

## 2021-02-21 NOTE — Progress Notes (Signed)
Chronic Care Management Pharmacy Note  02/23/2021 Name:  Amanda [name] MRN:  [id number] DOB:  04/21/1956  Subjective: Amanda [name] is an 65 y.o. year old female who is a primary patient of Vigg, [name], MD.  The CCM team was consulted for assistance with disease management and care coordination needs.    Engaged with patient by telephone for follow up visit in response to provider referral for pharmacy case management and/or care coordination services.   Consent to Services:  The patient was given information about Chronic Care Management services, agreed to services, and gave verbal consent prior to initiation of services.  Please see initial visit note for detailed documentation.   Patient Care Team: Charlynne Cousins, MD as PCP - General Vladimir Faster, [facility] (Pharmacist)  Recent office visits: 12/25/20- Cannady(PCP)- no med changes, TG up, increase water Na 145 09/05/20- Marnee Guarneri, DNP- blood work  Recent Consult Visits: 01/22/21- [facility] DEXA scan = normal  Hospital visits: None in previous 6 months  Objective:  Lab Results  Component Value Date   CREATININE 1.00 12/25/2020   BUN 22 12/25/2020   GFRNONAA 51 (L) 09/05/2020   GFRAA 59 (L) 09/05/2020   NA 145 (H) 12/25/2020   K 4.0 12/25/2020   CALCIUM 9.8 12/25/2020   CO2 25 12/25/2020    Lab Results  Component Value Date/Time   HGBA1C 7.1 (H) 12/25/2020 03:03 PM   HGBA1C 6.8 09/05/2020 04:11 PM   HGBA1C 7.8 06/12/2016 12:00 AM   MICROALBUR 30 (H) 12/25/2020 03:03 PM   MICROALBUR 30 (H) 11/30/2019 03:00 PM    Last diabetic Eye exam: No results found for: HMDIABEYEEXA  Last diabetic Foot exam: No results found for: HMDIABFOOTEX   Lab Results  Component Value Date   CHOL 154 12/25/2020   HDL 53 12/25/2020   LDLCALC 71 12/25/2020   TRIG 176 (H) 12/25/2020   CHOLHDL 3.1 08/18/2019    Hepatic Function Latest Ref Rng & Units 12/25/2020 08/18/2019 07/22/2018  Total Protein 6.0 - 8.5 g/dL 6.9 6.8 6.6  Albumin 3.8 - 4.8  g/dL 4.5 4.1 4.1  AST 0 - 40 IU/L 23 25 21   ALT 0 - 32 IU/L 16 22 21   Alk Phosphatase 44 - 121 IU/L 71 77 79  Total Bilirubin 0.0 - 1.2 mg/dL 0.3 0.3 0.3    Lab Results  Component Value Date/Time   TSH 2.130 09/05/2020 04:15 PM   TSH 2.980 08/18/2019 04:22 PM    CBC Latest Ref Rng & Units 08/18/2019 07/22/2018 01/07/2017  WBC 3.4 - 10.8 x10E3/uL 5.9 6.0 4.9  Hemoglobin 11.1 - 15.9 g/dL 11.5 11.1 11.7  Hematocrit 34.0 - 46.6 % 33.7(L) 32.6(L) 34.4  Platelets 150 - 450 x10E3/uL 204 210 198    No results found for: VD25OH  Clinical ASCVD: No  The 10-year ASCVD risk score Mikey Bussing DC Jr., et al., 2013) is: 12.4%   Values used to calculate the score:     Age: 59 years     Sex: Female     Is Non-Hispanic African American: No     Diabetic: Yes     Tobacco smoker: No     Systolic Blood Pressure: 856 mmHg     Is BP treated: Yes     HDL Cholesterol: 53 mg/dL     Total Cholesterol: 154 mg/dL    Depression screen [facility] 2/9 12/25/2020 08/01/2020 09/21/2019  Decreased Interest 0 0 0  Down, Depressed, Hopeless 0 0 0  PHQ - 2 Score  0 0 0  Altered sleeping 0 - -  Tired, decreased energy 1 - -  Change in appetite 2 - -  Feeling bad or failure about yourself  0 - -  Trouble concentrating 0 - -  Moving slowly or fidgety/restless 0 - -  Suicidal thoughts 0 - -  PHQ-9 Score 3 - -  Difficult doing work/chores Somewhat difficult - -       Social History   Tobacco Use  Smoking Status Former Smoker  . Quit date: 12/07/1975  . Years since quitting: 45.2  Smokeless Tobacco Never Used   BP Readings from Last 3 Encounters:  12/25/20 128/68  09/05/20 120/76  08/01/20 110/73   Pulse Readings from Last 3 Encounters:  12/25/20 73  09/05/20 73  08/01/20 65   Wt Readings from Last 3 Encounters:  12/25/20 171 lb 3.2 oz (77.7 kg)  09/05/20 177 lb 3.2 oz (80.4 kg)  08/01/20 180 lb (81.6 kg)    Assessment/Interventions: Review of patient past medical history, allergies, medications, health  status, including review of consultants reports, laboratory and other test data, was performed as part of comprehensive evaluation and provision of chronic care management services.   SDOH:  (Social Determinants of Health) assessments and interventions performed: No     Immunization History  Administered Date(s) Administered  . Influenza,inj,Quad PF,6+ Mos 09/16/2016, 07/16/2017, 07/22/2018, 08/18/2019, 08/01/2020  . Moderna Sars-Covid-2 Vaccination 12/18/2019, 01/15/2020, 10/16/2020  . Pneumococcal Conjugate-13 12/25/2020  . Pneumococcal Polysaccharide-23 10/26/2002  . Tdap 03/16/2013    Conditions to be addressed/monitored:  Hypertension, Hyperlipidemia, Diabetes, GERD and Chronic Kidney Disease  Care Plan : [place]  Updates made by Vladimir Faster, [place] since 02/23/2021 12:00 AM    Problem: Diabetes, HTN, CKD, HLD, GERD, Obesity   Priority: High    Goal: Disease Management   Start Date: 02/21/2021  This Visit's Progress: Not on track  Priority: High  Note:   Current Barriers:  . Unable to independently monitor therapeutic efficacy . Unable to maintain control of diabetes . Does not maintain contact with provider office  Pharmacist Clinical Goal(s):  Marland Kitchen Patient will achieve adherence to monitoring guidelines and medication adherence to achieve therapeutic efficacy . maintain control of diabetes  as evidenced by lab values SMBG values  through collaboration with PharmD and provider.  . Comprehensive medication review performed; medication list updated in electronic medical record  Patient Goals/Self-Care Activities . Patient will:  - take medications as prescribed check glucose daily  document, and provide at future appointments check blood pressure 3 times weekly, document, and provide at future appointments engage in dietary modifications by increasing water and fish consumption; decrease sodium and sugar,  Follow Up Plan: Telephone follow up appointment  with care management team member scheduled for: 1 month CPA 3 months PharmD   Interventions: . 1:1 collaboration with Charlynne Cousins, MD Marnee Guarneri, DNP regarding development and update of comprehensive plan of care as evidenced by provider attestation and co-signature . Inter-disciplinary care team collaboration (see longitudinal plan of care) . Comprehensive medication review performed; medication list updated in electronic medical record  Osteoporosis / Osteopenia (Prevent fracture) -Controlled -Last DEXA Scan: 01/22/21  -Results normal -Current treatment  . Calcium vitamin D -Medications previously tried: na  -Recommend [phone] units of vitamin D daily. Recommend 1200 mg of calcium daily from dietary and supplemental sources. Recommend weight-bearing and muscle strengthening exercises for building and maintaining bone density. -Counseled on diet and exercise extensively Recommended DEXA scan 02/21/21:DEXA  Completed Recommend continue calcium and vitamin D  BP Readings from Last 3 Encounters:  12/25/20 128/68  09/05/20 120/76  08/01/20 110/73    Hypertension (BP goal <130/80) -Controlled -Current treatment: . Benazepril 40 mg qd . Carvedilol 25 mg bid . 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 -Current exercise habits: no formal exercise, walks at work -Denies hypotensive/hypertensive symptoms -Educated on BP goals and benefits of medications for prevention of heart attack, stroke and kidney damage; Daily salt intake goal < 2300 mg; Exercise goal of 150 minutes per week; Symptoms of hypotension and importance of maintaining adequate hydration; -Counseled to monitor BP at home 3 times weekly, document, and provide log at future appointments -Counseled on  diet and exercise extensively Recommended to continue current medication  Lab Results  Component Value Date/Time   HGBA1C 7.1 (H) 12/25/2020 03:03 PM   HGBA1C 6.8 09/05/2020 04:11 PM   HGBA1C 7.8 06/12/2016 12:00 AM   last B12 level 393 recommended B!@ supplement Diabetes/ CKD (A1c goal <7%) -Not ideally controlled -Current medications: .  Trulicity 3 mg q week  . Synjardy XR 12.29m/1000mg  bid -Medications previously tried: NA -Current home glucose readings . fasting glucose: 130-140 . post prandial glucose: does not check regularly -Denies hypoglycemic/hyperglycemic symptoms --Educated on A1c and blood sugar goals; Complications of diabetes including kidney damage, retinal damage, and cardiovascular disease; Exercise goal of 150 minutes per week; Benefits of weight loss; Benefits of routine self-monitoring of blood sugar; -Counseled to check feet daily and get yearly eye exams -Counseled on diet and exercise extensively Recommended to continue current medication Assessed patient finances. Patient now has Medicare and her copayments were recently >$150 for each medication. We discussed patient assistance programs. Will ask CPA to mail  applications to patient.  Recommend patient begin Vitamin B12 500 mcg qday as suggested by PCP.  Lab Results  Component Value Date   CHOL 154 12/25/2020   HDL 53 12/25/2020   LDLCALC 71 12/25/2020   TRIG 176 (H) 12/25/2020   CHOLHDL 3.1 08/18/2019   The 10-year ASCVD risk score (Mikey BussingDC Jr., et al., 2013) is: 12.4%   Hyperlipidemia: (LDL goal < 70) -Controlled -Current treatment:  Lovastatin 20 mg qd  Aspirin 81 mg ec -Medications previously tried: NA  -Current dietary patterns: eats out frequently -Current exercise habits: No structured regimen -Educated on Cholesterol goals;  Benefits of statin for ASCVD risk reduction; Exercise goal of 150 minutes per week; Reducing carbohydrates and increasing fish intake for TG level as  recommended by PCP.  -Counseled on diet and exercise extensively Recommended to continue current medication Recommended patient consider fish oil supplement at effective dose of 4gm daily to reduce TG as she does not eat much fish in her diet.                 Medication Assistance: None required.  Patient affirms current coverage meets needs.  [facility]  [facility] [place]Phone: [phone]: [phone] Uses pill box? Yes Pt endorses 90% compliance  We discussed: Benefits of medication synchronization, packaging and delivery as well as enhanced pharmacist oversight with [employer]. Patient decided to: Continue current medication management strategy  Care Plan and Follow Up  Patient Decision:  Patient agrees to Care Plan and Follow-up.  Plan: Telephone follow up appointment with care management team member scheduled for:  3-4 months  Junita Push. [place] Kingfisher PharmD, [facility] [phone]

## 2021-02-23 NOTE — Patient Instructions (Addendum)
Visit Information  It was a pleasure speaking with you today. Thank you for letting me be part of your clinical team. Please call with any questions or concerns.   Goals Addressed            This Visit's Progress   . Monitor and Manage My Blood Sugar-Diabetes Type 2       Timeframe:  Long-Range Goal Priority:  High Start Date:                             Expected End Date:                       Follow Up Date 3 month follow up visit    - check blood sugar at prescribed times - check blood sugar if I feel it is too high or too low - take the blood sugar meter to all doctor visits    Why is this important?    Checking your blood sugar at home helps to keep it from getting very high or very low.   Writing the results in a diary or log helps the doctor know how to care for you.   Your blood sugar log should have the time, date and the results.   Also, write down the amount of insulin or other medicine that you take.   Other information, like what you ate, exercise done and how you were feeling, will also be helpful.     Notes:     . Obtain Eye Exam-Diabetes Type 2       Follow Up Date 3 month follow up    - schedule appointment with eye doctor    Why is this important?    Eye check-ups are important when you have diabetes.   Vision loss can be prevented.    Notes:        The patient verbalized understanding of instructions, educational materials, and care plan provided today and agreed to receive a mailed copy of patient instructions, educational materials, and care plan.   Telephone follow up appointment with pharmacy team member scheduled for: 2-4 weeks CPA, 3 months PharmD  [facility] Riel Hirschman PharmD, BCPS Clinical Pharmacist [phone]  Preventing Diabetes Mellitus Complications You can help to prevent or slow down problems that are caused by diabetes (diabetes mellitus). Following your diabetes plan and taking care of yourself can reduce your risk of  serious or life-threatening complications. What actions can I take to prevent diabetes complications? Diabetes management  Follow instructions from your health care providers about managing your diabetes. Your diabetes may be managed by a team of health care providers who can teach you how to care for yourself and can answer questions that you have.  Educate yourself about your condition so you can make healthy choices about eating and physical activity.  Know your target range for your blood sugar (glucose), and check your blood glucose level as often as told. Your health care provider will help you decide how often to check your blood glucose level depending on your treatment goals and how well you are meeting them.  Ask your health care provider if you should take low-dose aspirin daily and what dose is recommended for you. Taking low-dose aspirin daily is recommended to help prevent cardiovascular disease.   Controlling your blood pressure and cholesterol Your personal target blood pressure is determined based on:  Your age.  Your medicines.  How long you have had diabetes.  Any other medical conditions you have. To control your blood pressure:  Follow instructions from your health care provider about meal planning, exercise, and medicines.  Make sure your health care provider checks your blood pressure at every medical visit.  Monitor your blood pressure at home as told by your health care provider. To control your cholesterol:  Follow instructions from your health care provider about meal planning, exercise, and medicines.  Have your cholesterol checked at least once a year.  You may be prescribed medicine to lower cholesterol (statin). If you are not taking a statin, ask your health care provider if you should be. Controlling your cholesterol may:  Help prevent heart disease and stroke. These are the most common health problems for people with diabetes.  Improve your  blood flow.   Medical appointments and vaccines Schedule and keep yearly physical exams and eye exams. Your health care provider will tell you how often you need medical visits depending on your diabetes management plan. Keep all follow-up visits as told. This is important so possible problems can be identified early and complications can be avoided or treated.  Every visit with your health care provider should include measuring your: ? Weight. ? Blood pressure. ? Blood glucose control.  Your A1C (hemoglobin A1C) level should be checked: ? At least 2 times a year, if you are meeting your treatment goals. ? 4 times a year, if you are not meeting treatment goals or if your treatment goals have changed.  Your blood lipids (lipid profile) should be checked yearly. You should also be checked yearly for protein in your urine (urine microalbumin).  If you have type 1 diabetes, get an eye exam 3-5 years after you are diagnosed, and then once a year after your first exam.  If you have type 2 diabetes, get an eye exam as soon as you are diagnosed, and then once a year after your first exam. It is also important to keep your vaccines current. It is recommended that you receive:  A flu (influenza) vaccine every year.  A pneumonia (pneumococcal) vaccine and a hepatitis B vaccine. If you are age 39 or older, you may get the pneumonia vaccine as a series of two separate shots. Ask your health care provider which other vaccines may be recommended. Lifestyle  Do not use any products that contain nicotine or tobacco, such as cigarettes, e-cigarettes, and chewing tobacco. If you need help quitting, ask your health care provider. By avoiding nicotine and tobacco: ? You will lower your risk for heart attack, stroke, nerve disease, and kidney disease. ? Your cholesterol and blood pressure may improve. ? Your blood circulation will improve.  If you drink alcohol: ? Limit how much you use to:  0-1 drink a  day for women who are not pregnant.  0-2 drinks a day for men. ? Be aware of how much alcohol is in your drink. In the [place] one drink equals one 12 oz bottle of beer (355 mL), one 5 oz glass of wine (148 mL), or one 11?2 oz glass of hard liquor (44 mL). Taking care of your feet Diabetes may cause you to have poor blood circulation to your legs and feet. Because of this, taking care of your feet is very important. Diabetes can cause:  The skin on the feet to get thinner, break more easily, and heal more slowly.  Nerve damage in your legs and feet, which results in decreased feeling.  You may not notice minor injuries that could lead to serious problems. To avoid foot problems:  Check your skin and feet every day for cuts, bruises, redness, blisters, or sores.  Schedule a foot exam with your health care provider once every year. This exam includes: ? Inspecting the structure and skin of your feet. ? Checking the pulses and sensation in your feet.  Make sure that your health care provider performs a visual foot exam at every medical visit.   Taking care of your teeth People with poorly controlled diabetes are more likely to have gum (periodontal) disease. Diabetes can make periodontal diseases harder to control. If not treated, periodontal diseases can lead to tooth loss. To prevent this:  Brush your teeth twice a day.  Floss at least once a day.  Visit your dentist 2 times a year. Managing stress Living with diabetes can be stressful. When you are experiencing stress, your blood glucose may be affected in two ways:  Stress hormones may cause your blood glucose to rise.  You may be distracted from taking good care of yourself. Be aware of your stress level and make changes to help you manage challenging situations. To lower your stress levels:  Consider joining a support group.  Do planned relaxation or meditation.  Do a hobby that you enjoy.  Maintain healthy  relationships.  Exercise regularly.  Work with your health care provider or a mental health professional. Where to find more information  American Diabetes Association: www.diabetes.org  Association of Diabetes Care and Education Specialists: www.diabeteseducator.org Summary  You can take action to prevent or slow down problems that are caused by diabetes (diabetes mellitus). Following your diabetes plan and taking care of yourself can reduce your risk of serious or life-threatening complications.  Follow instructions from your health care providers about managing your diabetes. Your diabetes may be managed by a team of health care providers who can teach you how to care for yourself and can answer questions that you have.  Know your target range for your blood sugar (glucose), and check your blood glucose levels as often as told. Your health care provider will help you decide how often you should check your blood glucose level depending on your treatment goals and how well you are meeting them.  Your health care provider will tell you how often you need medical visits depending on your diabetes management plan. Keep all follow-up visits as directed. This is important so possible problems can be identified early and complications can be avoided or treated. This information is not intended to replace advice given to you by your health care provider. Make sure you discuss any questions you have with your health care provider. Document Revised: 11/26/2019 Document Reviewed: 11/26/2019 Elsevier Patient Education  2021 [employer].

## 2021-03-27 ENCOUNTER — Ambulatory Visit: Payer: Medicare HMO | Admitting: Internal Medicine

## 2021-03-28 ENCOUNTER — Ambulatory Visit: Payer: Medicare HMO | Admitting: Internal Medicine

## 2021-03-28 ENCOUNTER — Telehealth: Payer: Self-pay | Admitting: Pharmacist

## 2021-03-28 NOTE — Chronic Care Management (AMB) (Signed)
    Chronic Care Management Pharmacy Assistant   Name: Amanda [name]  MRN: [id number] DOB: Apr 26, 1956  Reason for Encounter: Disease State Diabetes Mellitus     Recent office visits:  None noted  Recent consult visits:  None noted  Hospital visits:  None in previous 6 months  Medications: Outpatient Encounter Medications as of 03/28/2021  Medication Sig   aspirin 81 MG EC tablet Take 81 mg by mouth daily. Swallow whole.   benazepril (LOTENSIN) 40 MG tablet Take 1 tablet (40 mg total) by mouth daily.   Calcium Carb-Cholecalciferol (CALCIUM 600+D3) 600-200 MG-UNIT TABS Take 1 tablet by mouth daily.   carvedilol (COREG) 25 MG tablet Take 1 tablet (25 mg total) by mouth 2 (two) times daily with a meal.   Empagliflozin-metFORMIN HCl ER (SYNJARDY XR) 12.02-999 MG TB24 Take 1 tablet by mouth 2 (two) times daily.   hydrochlorothiazide (HYDRODIURIL) 25 MG tablet Take 1 tablet (25 mg total) by mouth daily.   lovastatin (MEVACOR) 20 MG tablet Take 2 tablets (40 mg total) by mouth at bedtime.   meloxicam (MOBIC) 15 MG tablet Take 1 tablet by mouth once daily   Multiple Vitamin (MULTIVITAMIN) tablet Take 1 tablet by mouth daily.   omeprazole (PRILOSEC) 20 MG capsule Take 20 mg by mouth daily.   traZODone (DESYREL) 50 MG tablet Take 1 tablet (50 mg total) by mouth at bedtime.   TRULICITY 3 MG/0.5ML SOPN INJECT 0.5MLS AS DIRECTED ONCE A WEEK   No facility-administered encounter medications on file as of 03/28/2021.   Recent Relevant Labs: Lab Results  Component Value Date/Time   HGBA1C 7.1 (H) 12/25/2020 03:03 PM   HGBA1C 6.8 09/05/2020 04:11 PM   HGBA1C 7.8 06/12/2016 12:00 AM   MICROALBUR 30 (H) 12/25/2020 03:03 PM   MICROALBUR 30 (H) 11/30/2019 03:00 PM    Kidney Function Lab Results  Component Value Date/Time   CREATININE 1.00 12/25/2020 03:12 PM   CREATININE 1.13 (H) 09/05/2020 04:15 PM   GFRNONAA 51 (L) 09/05/2020 04:15 PM   GFRAA 59 (L) 09/05/2020 04:15 PM    Current  antihyperglycemic regimen:  Trulicity 3 mg q week  Synjardy XR 12.5mg /1000mg   bid  What recent interventions/DTPs have been made to improve glycemic control:  None noted  Have there been any recent hospitalizations or ED visits since last visit with CPP? No   Patient denies hypoglycemic symptoms, including Pale, Sweaty, Shaky, Hungry, Nervous/irritable, and Vision changes   Patient denies hyperglycemic symptoms, including blurry vision, excessive thirst, fatigue, polyuria, and weakness   How often are you checking your blood sugar? once daily   What are your blood sugars ranging?  Fasting: 139-04/02/21 Before meals:  After meals:  Bedtime:   During the week, how often does your blood glucose drop below 70? Never   Are you checking your feet daily/regularly?         Patient states she does check her feet daily.  Adherence Review: Is the patient currently on a STATIN medication? Yes Is the patient currently on ACE/ARB medication? Yes Does the patient have >5 day gap between last estimated fill dates? No   Star Rating Drugs: Benazepril 40 mg Last filled:03/19/21 90 DS Empagliflozin-metformin 12.02-999 mg Last filled:03/19/21 90 DS Lovastatin 20 mg Last filled:03/18/21 90 DS Trulicity 3mg /0.47ml Last filled:03/02/21 28 DS   Amanda [name] 03/04/21, RMA [facility]

## 2021-04-02 ENCOUNTER — Encounter: Payer: Self-pay | Admitting: Internal Medicine

## 2021-04-02 ENCOUNTER — Other Ambulatory Visit: Payer: Self-pay

## 2021-04-02 ENCOUNTER — Ambulatory Visit (INDEPENDENT_AMBULATORY_CARE_PROVIDER_SITE_OTHER): Payer: Medicare HMO | Admitting: Internal Medicine

## 2021-04-02 VITALS — BP 136/85 | HR 75 | Temp 98.3°F | Ht 60.12 in | Wt 169.2 lb

## 2021-04-02 DIAGNOSIS — E1169 Type 2 diabetes mellitus with other specified complication: Secondary | ICD-10-CM | POA: Diagnosis not present

## 2021-04-02 DIAGNOSIS — E119 Type 2 diabetes mellitus without complications: Secondary | ICD-10-CM

## 2021-04-02 DIAGNOSIS — Z1211 Encounter for screening for malignant neoplasm of colon: Secondary | ICD-10-CM | POA: Diagnosis not present

## 2021-04-02 DIAGNOSIS — E785 Hyperlipidemia, unspecified: Secondary | ICD-10-CM

## 2021-04-02 DIAGNOSIS — D519 Vitamin B12 deficiency anemia, unspecified: Secondary | ICD-10-CM | POA: Diagnosis not present

## 2021-04-02 LAB — URINALYSIS, ROUTINE W REFLEX MICROSCOPIC
Bilirubin, UA: NEGATIVE
Ketones, UA: NEGATIVE
Leukocytes,UA: NEGATIVE
Nitrite, UA: NEGATIVE
Protein,UA: NEGATIVE
RBC, UA: NEGATIVE
Specific Gravity, UA: 1.02 (ref 1.005–1.030)
Urobilinogen, Ur: 0.2 mg/dL (ref 0.2–1.0)
pH, UA: 6 (ref 5.0–7.5)

## 2021-04-02 LAB — BAYER DCA HB A1C WAIVED: HB A1C (BAYER DCA - WAIVED): 6.9 % (ref <7.0)

## 2021-04-02 NOTE — Progress Notes (Signed)
BP 136/85   Pulse 75   Temp 98.3 F (36.8 C) (Oral)   Ht 5' 0.12" (1.527 m)   Wt 169 lb 3.2 oz (76.7 kg)   SpO2 99%   BMI 32.92 kg/m    Subjective:    Patient ID: Amanda [name], female    DOB: 1956-10-15, 65 y.o.   MRN: [id number]  Chief Complaint  Patient presents with   Diabetes   Hyperlipidemia   Chronic Kidney Disease   Gastroesophageal Reflux    HPI: Amanda [name] is a 65 y.o. female  Diabetes She presents for her follow-up diabetic visit. She has type 2 diabetes mellitus. Pertinent negatives for hypoglycemia include no confusion, dizziness, headaches, nervousness/anxiousness or speech difficulty. Pertinent negatives for diabetes include no blurred vision, no chest pain, no fatigue, no foot paresthesias, no foot ulcerations, no polydipsia, no polyphagia, no polyuria, no visual change, no weakness and no weight loss.  Hyperlipidemia This is a chronic problem. The current episode started more than 1 year ago. She has no history of hypothyroidism or obesity. Pertinent negatives include no chest pain or shortness of breath.  Gastroesophageal Reflux She reports no abdominal pain, no chest pain, no coughing, no early satiety, no heartburn, no nausea, no sore throat, no stridor, no water brash or no wheezing. Pertinent negatives include no fatigue or weight loss.   Chief Complaint  Patient presents with   Diabetes   Hyperlipidemia   Chronic Kidney Disease   Gastroesophageal Reflux    Relevant past medical, surgical, family and social history reviewed and updated as indicated. Interim medical history since our last visit reviewed. Allergies and medications reviewed and updated.  Review of Systems  Constitutional:  Negative for activity change, appetite change, chills, fatigue, fever and weight loss.  HENT:  Negative for congestion and sore throat.   Eyes:  Negative for blurred vision and visual disturbance.  Respiratory:  Negative for apnea, cough, chest tightness, shortness  of breath and wheezing.   Cardiovascular:  Negative for chest pain, palpitations and leg swelling.  Gastrointestinal:  Negative for abdominal distention, abdominal pain, diarrhea, heartburn and nausea.  Endocrine: Negative for cold intolerance, heat intolerance, polydipsia, polyphagia and polyuria.  Genitourinary:  Negative for difficulty urinating, frequency, hematuria and urgency.  Skin:  Negative for color change and rash.  Neurological:  Negative for dizziness, speech difficulty, weakness, light-headedness, numbness and headaches.  Psychiatric/Behavioral:  Negative for behavioral problems and confusion. The patient is not nervous/anxious.    Per HPI unless specifically indicated above     Objective:    BP 136/85   Pulse 75   Temp 98.3 F (36.8 C) (Oral)   Ht 5' 0.12" (1.527 m)   Wt 169 lb 3.2 oz (76.7 kg)   SpO2 99%   BMI 32.92 kg/m   Wt Readings from Last 3 Encounters:  04/02/21 169 lb 3.2 oz (76.7 kg)  12/25/20 171 lb 3.2 oz (77.7 kg)  09/05/20 177 lb 3.2 oz (80.4 kg)    Physical Exam Vitals and nursing note reviewed.  Constitutional:      General: She is not in acute distress.    Appearance: Normal appearance. She is not ill-appearing or diaphoretic.  HENT:     Head: Normocephalic and atraumatic.     Right Ear: Tympanic membrane and external ear normal. There is no impacted cerumen.     Left Ear: External ear normal.     Nose: No congestion or rhinorrhea.     Mouth/Throat:  Pharynx: No oropharyngeal exudate or posterior oropharyngeal erythema.  Eyes:     Conjunctiva/sclera: Conjunctivae normal.     Pupils: Pupils are equal, round, and reactive to light.  Cardiovascular:     Rate and Rhythm: Normal rate and regular rhythm.     Heart sounds: No murmur heard.   No friction rub. No gallop.  Pulmonary:     Effort: No respiratory distress.     Breath sounds: No stridor. No wheezing or rhonchi.  Chest:     Chest wall: No tenderness.  Abdominal:     General:  Abdomen is flat. Bowel sounds are normal. There is no distension.     Palpations: Abdomen is soft. There is no mass.     Tenderness: There is no abdominal tenderness. There is no guarding.  Musculoskeletal:        General: No swelling or deformity.     Cervical back: Normal range of motion and neck supple. No rigidity or tenderness.     Right lower leg: No edema.     Left lower leg: No edema.  Skin:    General: Skin is warm and dry.     Coloration: Skin is not jaundiced.     Findings: No erythema.  Neurological:     Mental Status: She is alert and oriented to person, place, and time. Mental status is at baseline.  Psychiatric:        Mood and Affect: Mood normal.        Behavior: Behavior normal.        Thought Content: Thought content normal.        Judgment: Judgment normal.    Results for orders placed or performed in visit on 12/25/20  Bayer DCA Hb A1c Waived  Result Value Ref Range   HB A1C (BAYER DCA - WAIVED) 7.1 (H) <7.0 %  Microalbumin, Urine Waived  Result Value Ref Range   Microalb, Ur Waived 30 (H) 0 - 19 mg/L   Creatinine, Urine Waived 100 10 - 300 mg/dL   Microalb/Creat Ratio <30 <30 mg/g  Lipid Panel w/o Chol/HDL Ratio  Result Value Ref Range   Cholesterol, Total 154 100 - 199 mg/dL   Triglycerides 176 (H) 0 - 149 mg/dL   HDL 53 >39 mg/dL   VLDL Cholesterol Cal 30 5 - 40 mg/dL   LDL Chol Calc (NIH) 71 0 - 99 mg/dL  Comprehensive metabolic panel  Result Value Ref Range   Glucose 110 (H) 65 - 99 mg/dL   BUN 22 8 - 27 mg/dL   Creatinine, Ser 1.00 0.57 - 1.00 mg/dL   eGFR 63 >59 mL/min/1.73   BUN/Creatinine Ratio 22 12 - 28   Sodium 145 (H) 134 - 144 mmol/L   Potassium 4.0 3.5 - 5.2 mmol/L   Chloride 101 96 - 106 mmol/L   CO2 25 20 - 29 mmol/L   Calcium 9.8 8.7 - 10.3 mg/dL   Total Protein 6.9 6.0 - 8.5 g/dL   Albumin 4.5 3.8 - 4.8 g/dL   Globulin, Total 2.4 1.5 - 4.5 g/dL   Albumin/Globulin Ratio 1.9 1.2 - 2.2   Bilirubin Total 0.3 0.0 - 1.2 mg/dL    Alkaline Phosphatase 71 44 - 121 IU/L   AST 23 0 - 40 IU/L   ALT 16 0 - 32 IU/L  B12  Result Value Ref Range   Vitamin B-12 393 232 - 1,245 pg/mL        Current Outpatient Medications:    aspirin  81 MG EC tablet, Take 81 mg by mouth daily. Swallow whole., Disp: , Rfl:    benazepril (LOTENSIN) 40 MG tablet, Take 1 tablet (40 mg total) by mouth daily., Disp: 90 tablet, Rfl: 4   Calcium Carb-Cholecalciferol (CALCIUM 600+D3) 600-200 MG-UNIT TABS, Take 1 tablet by mouth daily., Disp: , Rfl:    carvedilol (COREG) 25 MG tablet, Take 1 tablet (25 mg total) by mouth 2 (two) times daily with a meal., Disp: 180 tablet, Rfl: 4   Empagliflozin-metFORMIN HCl ER (SYNJARDY XR) 12.02-999 MG TB24, Take 1 tablet by mouth 2 (two) times daily., Disp: 180 tablet, Rfl: 4   hydrochlorothiazide (HYDRODIURIL) 25 MG tablet, Take 1 tablet (25 mg total) by mouth daily., Disp: 90 tablet, Rfl: 4   lovastatin (MEVACOR) 20 MG tablet, Take 2 tablets (40 mg total) by mouth at bedtime., Disp: 180 tablet, Rfl: 4   meloxicam (MOBIC) 15 MG tablet, Take 1 tablet by mouth once daily, Disp: 90 tablet, Rfl: 0   Multiple Vitamin (MULTIVITAMIN) tablet, Take 1 tablet by mouth daily., Disp: , Rfl:    omeprazole (PRILOSEC) 20 MG capsule, Take 20 mg by mouth daily., Disp: , Rfl:    traZODone (DESYREL) 50 MG tablet, Take 1 tablet (50 mg total) by mouth at bedtime., Disp: 90 tablet, Rfl: 4   TRULICITY 3 VJ/5.0NX SOPN, INJECT 0.5MLS AS DIRECTED ONCE A WEEK, Disp: 6 mL, Rfl: 0    Assessment & Plan:   HLD is on mevacor for such  recheck FLP, check LFT's work on diet, SE of meds explained to pt. low fat and high fiber diet explained to pt.   DM a1c at 7/1 last visit Is on synjardy and trulicity 3 mg once a week. Last ophthalmology done @ last year   check HbA1c,  urine  microalbumin  diabetic diet plan given to pt  adviced regarding hypoglycemia and instructions given to pt today on how to prevent and treat the same if it were to occur.  pt acknowledges the plan and voices understanding of the same.  exercise plan given and encouraged.   advice diabetic yearly podiatry, ophthalmology , nutritionist , dental check q 6 months   HTN  Stable, HTN : is on lotensin40 mg daily, hctz 25 mg dailt.  Continue current meds.  Medication compliance emphasised. pt advised to keep Bp logs. Pt verbalised understanding of the same. Pt to have a low salt diet . Exercise to reach a goal of at least 150 mins a week.  lifestyle modifications explained and pt understands importance of the above.   Problem List Items Addressed This Visit   None    Follow up plan: No follow-ups on file.  Health Maintenance  Mammogram : none done then never did one.  Paps smear:  DEXA: 2022  Cscope :- Colonoscopy: Up to date  -- due in May 2022 wants cologaurd

## 2021-04-03 LAB — BASIC METABOLIC PANEL
BUN/Creatinine Ratio: 18 (ref 12–28)
BUN: 19 mg/dL (ref 8–27)
CO2: 23 mmol/L (ref 20–29)
Calcium: 9.5 mg/dL (ref 8.7–10.3)
Chloride: 100 mmol/L (ref 96–106)
Creatinine, Ser: 1.07 mg/dL — ABNORMAL HIGH (ref 0.57–1.00)
Glucose: 98 mg/dL (ref 65–99)
Potassium: 3.9 mmol/L (ref 3.5–5.2)
Sodium: 141 mmol/L (ref 134–144)
eGFR: 58 mL/min/{1.73_m2} — ABNORMAL LOW (ref >59)

## 2021-04-03 LAB — HEPATIC FUNCTION PANEL
ALT: 16 IU/L (ref 0–32)
AST: 23 IU/L (ref 0–40)
Albumin: 4.1 g/dL (ref 3.8–4.8)
Alkaline Phosphatase: 63 IU/L (ref 44–121)
Bilirubin Total: 0.4 mg/dL (ref 0.0–1.2)
Bilirubin, Direct: 0.15 mg/dL (ref 0.00–0.40)
Total Protein: 6.5 g/dL (ref 6.0–8.5)

## 2021-04-03 LAB — VITAMIN B12: Vitamin B-12: 946 pg/mL (ref 232–1245)

## 2021-04-11 ENCOUNTER — Other Ambulatory Visit: Payer: Self-pay

## 2021-04-11 ENCOUNTER — Other Ambulatory Visit: Payer: Medicare HMO

## 2021-04-11 ENCOUNTER — Encounter: Payer: Self-pay | Admitting: Internal Medicine

## 2021-04-11 DIAGNOSIS — E785 Hyperlipidemia, unspecified: Secondary | ICD-10-CM | POA: Diagnosis not present

## 2021-04-11 DIAGNOSIS — E1169 Type 2 diabetes mellitus with other specified complication: Secondary | ICD-10-CM

## 2021-04-12 LAB — LIPID PANEL
Chol/HDL Ratio: 2.7 ratio (ref 0.0–4.4)
Cholesterol, Total: 148 mg/dL (ref 100–199)
HDL: 54 mg/dL (ref >39)
LDL Chol Calc (NIH): 70 mg/dL (ref 0–99)
Triglycerides: 139 mg/dL (ref 0–149)
VLDL Cholesterol Cal: 24 mg/dL (ref 5–40)

## 2021-04-19 ENCOUNTER — Other Ambulatory Visit: Payer: Self-pay | Admitting: Nurse Practitioner

## 2021-04-27 ENCOUNTER — Other Ambulatory Visit: Payer: Self-pay | Admitting: Nurse Practitioner

## 2021-05-03 ENCOUNTER — Encounter: Payer: Self-pay | Admitting: Podiatry

## 2021-05-03 ENCOUNTER — Encounter: Payer: Self-pay | Admitting: Internal Medicine

## 2021-05-03 ENCOUNTER — Other Ambulatory Visit: Payer: Self-pay

## 2021-05-03 ENCOUNTER — Ambulatory Visit (INDEPENDENT_AMBULATORY_CARE_PROVIDER_SITE_OTHER): Payer: Medicare HMO | Admitting: Internal Medicine

## 2021-05-03 ENCOUNTER — Ambulatory Visit: Payer: Medicare HMO | Admitting: Podiatry

## 2021-05-03 VITALS — BP 138/81 | HR 62 | Temp 98.9°F | Ht 60.12 in | Wt 168.0 lb

## 2021-05-03 DIAGNOSIS — N183 Chronic kidney disease, stage 3 unspecified: Secondary | ICD-10-CM

## 2021-05-03 DIAGNOSIS — I152 Hypertension secondary to endocrine disorders: Secondary | ICD-10-CM | POA: Diagnosis not present

## 2021-05-03 DIAGNOSIS — E1122 Type 2 diabetes mellitus with diabetic chronic kidney disease: Secondary | ICD-10-CM

## 2021-05-03 DIAGNOSIS — E119 Type 2 diabetes mellitus without complications: Secondary | ICD-10-CM | POA: Diagnosis not present

## 2021-05-03 DIAGNOSIS — E1159 Type 2 diabetes mellitus with other circulatory complications: Secondary | ICD-10-CM

## 2021-05-03 DIAGNOSIS — E1169 Type 2 diabetes mellitus with other specified complication: Secondary | ICD-10-CM | POA: Diagnosis not present

## 2021-05-03 DIAGNOSIS — E785 Hyperlipidemia, unspecified: Secondary | ICD-10-CM | POA: Diagnosis not present

## 2021-05-03 DIAGNOSIS — Z1329 Encounter for screening for other suspected endocrine disorder: Secondary | ICD-10-CM | POA: Diagnosis not present

## 2021-05-03 DIAGNOSIS — E1121 Type 2 diabetes mellitus with diabetic nephropathy: Secondary | ICD-10-CM | POA: Diagnosis not present

## 2021-05-03 DIAGNOSIS — Z6832 Body mass index (BMI) 32.0-32.9, adult: Secondary | ICD-10-CM | POA: Diagnosis not present

## 2021-05-03 DIAGNOSIS — E6609 Other obesity due to excess calories: Secondary | ICD-10-CM | POA: Diagnosis not present

## 2021-05-03 MED ORDER — MELOXICAM 15 MG PO TABS: 15.0000 mg | ORAL_TABLET | Freq: Every day | ORAL | 2 refills | Status: DC

## 2021-05-03 NOTE — Progress Notes (Signed)
BP 138/81   Pulse 62   Temp 98.9 F (37.2 C) (Oral)   Ht 5' 0.12" (1.527 m)   Wt 168 lb (76.2 kg)   SpO2 100%   BMI 32.68 kg/m    Subjective:    Patient ID: Amanda [name], female    DOB: April 14, 1956, 65 y.o.   MRN: [id number]  Chief Complaint  Patient presents with   Diabetes   Chronic Kidney Disease   Gastroesophageal Reflux    HPI: Amanda [name] is a 65 y.o. female  Diabetes She presents for her follow-up diabetic visit. She has type 2 diabetes mellitus. Pertinent negatives for diabetes include no chest pain.  Gastroesophageal Reflux She reports no abdominal pain, no belching, no chest pain, no early satiety, no globus sensation or no heartburn.  Hypertension This is a chronic problem. Pertinent negatives include no chest pain.  Hyperlipidemia This is a chronic problem. The problem is controlled. Pertinent negatives include no chest pain.   Chief Complaint  Patient presents with   Diabetes   Chronic Kidney Disease   Gastroesophageal Reflux    Relevant past medical, surgical, family and social history reviewed and updated as indicated. Interim medical history since our last visit reviewed. Allergies and medications reviewed and updated.  Review of Systems  Cardiovascular:  Negative for chest pain.  Gastrointestinal:  Negative for abdominal pain and heartburn.   Per HPI unless specifically indicated above     Objective:    BP 138/81   Pulse 62   Temp 98.9 F (37.2 C) (Oral)   Ht 5' 0.12" (1.527 m)   Wt 168 lb (76.2 kg)   SpO2 100%   BMI 32.68 kg/m   Wt Readings from Last 3 Encounters:  05/03/21 168 lb (76.2 kg)  04/02/21 169 lb 3.2 oz (76.7 kg)  12/25/20 171 lb 3.2 oz (77.7 kg)    Physical Exam Vitals and nursing note reviewed.  Constitutional:      General: She is not in acute distress.    Appearance: Normal appearance. She is not ill-appearing or diaphoretic.  HENT:     Head: Normocephalic and atraumatic.     Right Ear: Tympanic membrane and  external ear normal. There is no impacted cerumen.     Left Ear: External ear normal.     Nose: No congestion or rhinorrhea.     Mouth/Throat:     Pharynx: No oropharyngeal exudate or posterior oropharyngeal erythema.  Eyes:     Conjunctiva/sclera: Conjunctivae normal.     Pupils: Pupils are equal, round, and reactive to light.  Cardiovascular:     Rate and Rhythm: Normal rate and regular rhythm.     Heart sounds: No murmur heard.   No friction rub. No gallop.  Pulmonary:     Effort: No respiratory distress.     Breath sounds: No stridor. No wheezing or rhonchi.  Chest:     Chest wall: No tenderness.  Abdominal:     General: Abdomen is flat. Bowel sounds are normal. There is no distension.     Palpations: Abdomen is soft. There is no mass.     Tenderness: There is no abdominal tenderness. There is no guarding.  Musculoskeletal:        General: No swelling or deformity.     Cervical back: Normal range of motion and neck supple. No rigidity or tenderness.     Right lower leg: No edema.     Left lower leg: No edema.  Skin:  General: Skin is warm and dry.     Coloration: Skin is not jaundiced.     Findings: No erythema.  Neurological:     Mental Status: She is alert and oriented to person, place, and time. Mental status is at baseline.  Psychiatric:        Mood and Affect: Mood normal.        Behavior: Behavior normal.        Thought Content: Thought content normal.        Judgment: Judgment normal.    Results for orders placed or performed in visit on 04/11/21  Lipid Profile  Result Value Ref Range   Cholesterol, Total 148 100 - 199 mg/dL   Triglycerides 509 0 - 149 mg/dL   HDL 54 >32 mg/dL   VLDL Cholesterol Cal 24 5 - 40 mg/dL   LDL Chol Calc (NIH) 70 0 - 99 mg/dL   Chol/HDL Ratio 2.7 0.0 - 4.4 ratio        Current Outpatient Medications:    aspirin 81 MG EC tablet, Take 81 mg by mouth daily. Swallow whole., Disp: , Rfl:    benazepril (LOTENSIN) 40 MG tablet,  Take 1 tablet (40 mg total) by mouth daily., Disp: 90 tablet, Rfl: 4   Calcium Carb-Cholecalciferol (CALCIUM 600+D3) 600-200 MG-UNIT TABS, Take 1 tablet by mouth daily., Disp: , Rfl:    carvedilol (COREG) 25 MG tablet, Take 1 tablet (25 mg total) by mouth 2 (two) times daily with a meal., Disp: 180 tablet, Rfl: 4   Empagliflozin-metFORMIN HCl ER (SYNJARDY XR) 12.02-999 MG TB24, Take 1 tablet by mouth 2 (two) times daily., Disp: 180 tablet, Rfl: 4   hydrochlorothiazide (HYDRODIURIL) 25 MG tablet, Take 1 tablet (25 mg total) by mouth daily., Disp: 90 tablet, Rfl: 4   lovastatin (MEVACOR) 20 MG tablet, Take 2 tablets (40 mg total) by mouth at bedtime., Disp: 180 tablet, Rfl: 4   Multiple Vitamin (MULTIVITAMIN) tablet, Take 1 tablet by mouth daily., Disp: , Rfl:    omeprazole (PRILOSEC) 20 MG capsule, Take 20 mg by mouth daily., Disp: , Rfl:    traZODone (DESYREL) 50 MG tablet, Take 1 tablet (50 mg total) by mouth at bedtime., Disp: 90 tablet, Rfl: 4   TRULICITY 3 MG/0.5ML SOPN, INJECT 1 DOSE (3MG ) SUBCUTANEOUSLY AS DIRECTED ONCE A WEEK, Disp: 6 mL, Rfl: 0   meloxicam (MOBIC) 15 MG tablet, Take 1 tablet (15 mg total) by mouth daily. Only to use prn, Disp: 30 tablet, Rfl: 2    Assessment & Plan:  HLD is on mevacor for such  recheck FLP, check LFT's work on diet, SE of meds explained to pt. low fat and high fiber diet explained to pt.     DM a1c at 7/1 last visit; seen podiatry for Dm a1c 6.9 FSBS - 139  Is on synjardy and trulicity 3 mg once a week. Last ophthalmology done @ last year    check HbA1c,  urine  microalbumin  diabetic diet plan given to pt  adviced regarding hypoglycemia and instructions given to pt today on how to prevent and treat the same if it were to occur. pt acknowledges the plan and voices understanding of the same.  exercise plan given and encouraged.   advice diabetic yearly podiatry, ophthalmology , nutritionist , dental check q 6 months     HTN Stable, HTN : is on  lotensin40 mg daily, hctz 25 mg dailt.  Continue current meds.  Medication compliance emphasised. pt  advised to keep Bp logs. Pt verbalised understanding of the same. Pt to have a low salt diet . Exercise to reach a goal of at least 150 mins a week.  lifestyle modifications explained and pt understands importance of the above.   Problem List Items Addressed This Visit   None   Orders Placed This Encounter  Procedures   Comprehensive metabolic panel   Bayer DCA Hb Q1J Waived   Thyroid Panel With TSH   CBC with Differential/Platelet   Lipid panel      Meds ordered this encounter  Medications   meloxicam (MOBIC) 15 MG tablet    Sig: Take 1 tablet (15 mg total) by mouth daily. Only to use prn    Dispense:  30 tablet    Refill:  2     Follow up plan: No follow-ups on file.

## 2021-05-03 NOTE — Progress Notes (Signed)
Subjective: Amanda [name] presents today referred by Loura Pardon, MD for diabetic foot evaluation.  Patient relates two year history of diabetes.  Patient denies any history of foot wounds.  Patient denies any history of numbness, tingling, burning, pins/needles sensations.  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 Swallow whole.     benazepril (LOTENSIN) 40 MG tablet Take 1 tablet (40 mg total) by mouth daily. 90 tablet 4   Calcium Carb-Cholecalciferol (CALCIUM 600+D3) 600-200 MG-UNIT TABS Take 1 tablet by mouth daily.     carvedilol (COREG) 25 MG tablet Take 1 tablet (25 mg total) by mouth 2 (two) times daily with a meal. 180 tablet 4   Empagliflozin-metFORMIN HCl ER (SYNJARDY XR) 12.02-999 MG TB24 Take 1 tablet by mouth 2 (two) times daily. 180 tablet 4   hydrochlorothiazide (HYDRODIURIL) 25 MG tablet Take 1 tablet (25 mg total) by mouth daily. 90 tablet 4   lovastatin (MEVACOR) 20 MG tablet Take 2 tablets (40 mg total) by mouth at bedtime. 180 tablet 4   meloxicam (MOBIC) 15 MG tablet Take 1 tablet by mouth once daily 90 tablet 0   Multiple Vitamin (MULTIVITAMIN) tablet Take 1 tablet by mouth daily.     omeprazole (PRILOSEC) 20 MG capsule Take 20 mg by mouth daily.     traZODone (DESYREL) 50 MG  tablet Take 1 tablet (50 mg total) by mouth at bedtime. 90 tablet 4   TRULICITY 3 MG/0.5ML SOPN INJECT 1 DOSE (3MG ) SUBCUTANEOUSLY AS DIRECTED ONCE A WEEK 6 mL 0   No current facility-administered medications on file prior to visit.     Allergies  Allergen Reactions   Penicillins    Tramadol Other (See Comments)    "woozy"    Social History   Occupational History   Not on file  Tobacco Use   Smoking status: Former    Types: Cigarettes    Quit date: 12/07/1975    Years since quitting: 45.4   Smokeless tobacco: Never  Vaping Use   Vaping Use: Never used  Substance and Sexual Activity   Alcohol use: No   Drug use: No   Sexual activity: Not on file    Family History  Problem Relation Age of Onset   Hypertension Mother    Diabetes Mother    Diabetes Sister    Heart disease Sister    Heart disease Sister    Cancer Niece     Immunization History  Administered Date(s) Administered   Influenza,inj,Quad PF,6+ Mos 09/16/2016, 07/16/2017, 07/22/2018, 08/18/2019, 08/01/2020   Moderna Sars-Covid-2 Vaccination 12/18/2019, 01/15/2020, 10/16/2020   Pneumococcal Conjugate-13 12/25/2020   Pneumococcal Polysaccharide-23 10/26/2002   Tdap 03/16/2013    Review of systems: Positive Findings in bold print.  Constitutional:  chills, fatigue, fever, sweats, weight change Communication: Nurse, learning disability, sign Presenter, broadcasting, hand writing, iPad/Android device Head: headaches, head injury Eyes: changes in vision, eye pain, glaucoma, cataracts, macular degeneration, diplopia, glare,  light sensitivity, eyeglasses or contacts, blindness Ears nose mouth throat: hearing impaired, hearing aids,  ringing in ears, deaf, sign language,  vertigo, nosebleeds,  rhinitis,  cold sores, snoring, swollen glands Cardiovascular: HTN, edema, arrhythmia, pacemaker in place, defibrillator in place, chest pain/tightness, chronic anticoagulation, blood clot, heart failure, MI Peripheral Vascular: leg cramps,  varicose veins, blood clots, lymphedema, varicosities Respiratory:  asthma, difficulty breathing, denies congestion, SOB, wheezing, cough, emphysema Gastrointestinal: change in appetite or weight, abdominal pain, constipation, diarrhea, nausea, vomiting, vomiting blood, change in bowel habits, abdominal pain, jaundice, rectal bleeding, hemorrhoids, GERD Genitourinary:  nocturia,  pain on urination, polyuria,  blood in urine, Foley catheter, urinary urgency, ESRD on hemodialysis Musculoskeletal: amputation, cramping, stiff joints, painful joints, decreased joint motion, fractures, OA, gout, hemiplegia, paraplegia, uses cane, wheelchair bound, uses walker, uses rollator Skin: +changes in toenails, color change, dryness, itching, mole changes,  rash, wound(s) Neurological: headaches, numbness in feet, paresthesias in feet, burning in feet, fainting,  seizures, change in speech, migraines, memory problems/poor historian, cerebral palsy, weakness, paralysis, CVA, TIA Endocrine: diabetes, hypothyroidism, hyperthyroidism,  goiter, dry mouth, flushing, heat intolerance, cold intolerance,  excessive thirst, denies polyuria,  nocturia Hematological:  easy bleeding, excessive bleeding, easy bruising, enlarged lymph nodes, on long term blood thinner, history of past transusions Allergy/immunological:  hives, eczema, frequent infections, multiple drug allergies, seasonal allergies, transplant recipient, multiple food allergies Psychiatric:  anxiety, depression, mood disorder, suicidal ideations, hallucinations, insomnia  Objective: There were no vitals filed for this visit. Vascular Examination: Capillary refill time less than 3 seconds x 10 digits.  Dorsalis pedis pulses palpable.  Posterior tibial pulses palpable.  Digital hair not present x 10 digits.  Skin temperature gradient WNL b/l.  Dermatological Examination: Skin with normal turgor, texture and tone b/l  Toenails 1-5 b/l discolored, thick,  dystrophic with subungual debris and pain with palpation to nailbeds due to thickness of nails.  Musculoskeletal: Muscle strength 5/5 to all LE muscle groups.  Neurological: Sensation intact with 10 gram monofilament.  Vibratory sensation intact.  Assessment: NIDDM Encounter for diabetic foot examination  Plan: Discussed diabetic foot care principles. Literature dispensed on today. Patient to continue soft, supportive shoe gear daily. Patient to report any pedal injuries to medical professional immediately. Follow up one year. Patient/POA to call should there be a concern in the interim.

## 2021-06-07 ENCOUNTER — Other Ambulatory Visit: Payer: Self-pay

## 2021-06-07 DIAGNOSIS — Z1231 Encounter for screening mammogram for malignant neoplasm of breast: Secondary | ICD-10-CM

## 2021-07-12 ENCOUNTER — Telehealth: Payer: Self-pay

## 2021-07-12 NOTE — Chronic Care Management (AMB) (Signed)
    Chronic Care Management Pharmacy Assistant   Name: SHAUNDRA FULLAM  MRN: [id number] DOB: [birth year]  Reason for Encounter: Disease State Diabetes Mellitus  Recent office visits:  05/03/21-[name] Charlotta Newton, MD (PCP) General follow up visit. Labs ordered. Follow up in 4 months. 04/02/21-[name] Vigg, MD (PCP) General follow up visit.  Recent consult visits:  05/03/21-Kevin P. Allena Katz, DPM (Podiatry) Seen for diabetic follow up. Follow up in 3 weeks.  Hospital visits:  None in previous 6 months  Medications: Outpatient Encounter Medications as of 07/12/2021  Medication Sig   aspirin 81 MG EC tablet Take 81 mg by mouth daily. Swallow whole.   benazepril (LOTENSIN) 40 MG tablet Take 1 tablet (40 mg total) by mouth daily.   Calcium Carb-Cholecalciferol (CALCIUM 600+D3) 600-200 MG-UNIT TABS Take 1 tablet by mouth daily.   carvedilol (COREG) 25 MG tablet Take 1 tablet (25 mg total) by mouth 2 (two) times daily with a meal.   Empagliflozin-metFORMIN HCl ER (SYNJARDY XR) 12.02-999 MG TB24 Take 1 tablet by mouth 2 (two) times daily.   hydrochlorothiazide (HYDRODIURIL) 25 MG tablet Take 1 tablet (25 mg total) by mouth daily.   lovastatin (MEVACOR) 20 MG tablet Take 2 tablets (40 mg total) by mouth at bedtime.   meloxicam (MOBIC) 15 MG tablet Take 1 tablet (15 mg total) by mouth daily. Only to use prn   Multiple Vitamin (MULTIVITAMIN) tablet Take 1 tablet by mouth daily.   omeprazole (PRILOSEC) 20 MG capsule Take 20 mg by mouth daily.   traZODone (DESYREL) 50 MG tablet Take 1 tablet (50 mg total) by mouth at bedtime.   TRULICITY 3 MG/0.5ML SOPN INJECT 1 DOSE (3MG ) SUBCUTANEOUSLY AS DIRECTED ONCE A WEEK   No facility-administered encounter medications on file as of 07/12/2021.   Current antihyperglycemic regimen:  Trulicity 3 mg q week  Synjardy XR 12.5mg /1000mg   bid   Unsuccessful out reach to complete assessment call. I have called 3x and left 3 voicemail's to return phone call.  Adherence  Review: Is the patient currently on a STATIN medication? Yes Is the patient currently on ACE/ARB medication? Yes Does the patient have >5 day gap between last estimated fill dates? No   Care Gaps: Zoster Vaccines- Shingrix:Never done OPHTHALMOLOGY EXAM:Last completed: Feb 18, 2018 COVID-19 Vaccine:Last completed: Oct 16, 2020 INFLUENZA VACCINE:Last completed: Aug 01, 2020  Star Rating Drugs: Trulciity 3mg /0.46ml Last filled:06/20/21 28 DS Lovastatin 20 mg Last filled:06/20/21 30 DS Benazepril 40 mg Last filled:06/20/21 30 DS Empagliflozin-metformin 12.02-999 Last filled:06/20/21 30 DS  [name] 10-30-1990, RMA [facility]

## 2021-07-18 ENCOUNTER — Other Ambulatory Visit: Payer: Self-pay | Admitting: Nurse Practitioner

## 2021-08-30 ENCOUNTER — Other Ambulatory Visit: Payer: Medicare HMO

## 2021-08-30 ENCOUNTER — Other Ambulatory Visit: Payer: Self-pay

## 2021-08-30 DIAGNOSIS — E6609 Other obesity due to excess calories: Secondary | ICD-10-CM

## 2021-08-30 DIAGNOSIS — E1169 Type 2 diabetes mellitus with other specified complication: Secondary | ICD-10-CM

## 2021-08-30 DIAGNOSIS — Z1329 Encounter for screening for other suspected endocrine disorder: Secondary | ICD-10-CM | POA: Diagnosis not present

## 2021-08-30 DIAGNOSIS — Z6832 Body mass index (BMI) 32.0-32.9, adult: Secondary | ICD-10-CM | POA: Diagnosis not present

## 2021-08-30 DIAGNOSIS — E1159 Type 2 diabetes mellitus with other circulatory complications: Secondary | ICD-10-CM

## 2021-08-30 DIAGNOSIS — E1122 Type 2 diabetes mellitus with diabetic chronic kidney disease: Secondary | ICD-10-CM | POA: Diagnosis not present

## 2021-08-30 DIAGNOSIS — N183 Chronic kidney disease, stage 3 unspecified: Secondary | ICD-10-CM

## 2021-08-30 DIAGNOSIS — I152 Hypertension secondary to endocrine disorders: Secondary | ICD-10-CM

## 2021-08-30 DIAGNOSIS — E1121 Type 2 diabetes mellitus with diabetic nephropathy: Secondary | ICD-10-CM | POA: Diagnosis not present

## 2021-08-30 DIAGNOSIS — E785 Hyperlipidemia, unspecified: Secondary | ICD-10-CM | POA: Diagnosis not present

## 2021-08-30 LAB — BAYER DCA HB A1C WAIVED: HB A1C (BAYER DCA - WAIVED): 6.7 % — ABNORMAL HIGH (ref 4.8–5.6)

## 2021-08-31 LAB — CBC WITH DIFFERENTIAL/PLATELET
Basophils Absolute: 0 10*3/uL (ref 0.0–0.2)
Basos: 1 %
EOS (ABSOLUTE): 0.2 10*3/uL (ref 0.0–0.4)
Eos: 3 %
Hematocrit: 33.1 % — ABNORMAL LOW (ref 34.0–46.6)
Hemoglobin: 11.3 g/dL (ref 11.1–15.9)
Immature Grans (Abs): 0 10*3/uL (ref 0.0–0.1)
Immature Granulocytes: 0 %
Lymphocytes Absolute: 1.6 10*3/uL (ref 0.7–3.1)
Lymphs: 25 %
MCH: 31.5 pg (ref 26.6–33.0)
MCHC: 34.1 g/dL (ref 31.5–35.7)
MCV: 92 fL (ref 79–97)
Monocytes Absolute: 0.3 10*3/uL (ref 0.1–0.9)
Monocytes: 5 %
Neutrophils Absolute: 4.3 10*3/uL (ref 1.4–7.0)
Neutrophils: 66 %
Platelets: 227 10*3/uL (ref 150–450)
RBC: 3.59 x10E6/uL — ABNORMAL LOW (ref 3.77–5.28)
RDW: 12.9 % (ref 11.7–15.4)
WBC: 6.5 10*3/uL (ref 3.4–10.8)

## 2021-08-31 LAB — LIPID PANEL
Chol/HDL Ratio: 2.8 ratio (ref 0.0–4.4)
Cholesterol, Total: 142 mg/dL (ref 100–199)
HDL: 50 mg/dL (ref >39)
LDL Chol Calc (NIH): 64 mg/dL (ref 0–99)
Triglycerides: 163 mg/dL — ABNORMAL HIGH (ref 0–149)
VLDL Cholesterol Cal: 28 mg/dL (ref 5–40)

## 2021-08-31 LAB — COMPREHENSIVE METABOLIC PANEL
ALT: 16 IU/L (ref 0–32)
AST: 21 IU/L (ref 0–40)
Albumin/Globulin Ratio: 1.8 (ref 1.2–2.2)
Albumin: 4.3 g/dL (ref 3.8–4.8)
Alkaline Phosphatase: 80 IU/L (ref 44–121)
BUN/Creatinine Ratio: 21 (ref 12–28)
BUN: 23 mg/dL (ref 8–27)
Bilirubin Total: 0.3 mg/dL (ref 0.0–1.2)
CO2: 27 mmol/L (ref 20–29)
Calcium: 10 mg/dL (ref 8.7–10.3)
Chloride: 101 mmol/L (ref 96–106)
Creatinine, Ser: 1.07 mg/dL — ABNORMAL HIGH (ref 0.57–1.00)
Globulin, Total: 2.4 g/dL (ref 1.5–4.5)
Glucose: 128 mg/dL — ABNORMAL HIGH (ref 70–99)
Potassium: 4.4 mmol/L (ref 3.5–5.2)
Sodium: 141 mmol/L (ref 134–144)
Total Protein: 6.7 g/dL (ref 6.0–8.5)
eGFR: 57 mL/min/{1.73_m2} — ABNORMAL LOW (ref >59)

## 2021-08-31 LAB — THYROID PANEL WITH TSH
Free Thyroxine Index: 2.1 (ref 1.2–4.9)
T3 Uptake Ratio: 27 % (ref 24–39)
T4, Total: 7.7 ug/dL (ref 4.5–12.0)
TSH: 1.64 u[IU]/mL (ref 0.450–4.500)

## 2021-09-03 ENCOUNTER — Encounter: Payer: Self-pay | Admitting: Internal Medicine

## 2021-09-03 ENCOUNTER — Ambulatory Visit (INDEPENDENT_AMBULATORY_CARE_PROVIDER_SITE_OTHER): Payer: Medicare HMO | Admitting: Internal Medicine

## 2021-09-03 ENCOUNTER — Other Ambulatory Visit: Payer: Self-pay

## 2021-09-03 VITALS — Ht 60.24 in | Wt 161.2 lb

## 2021-09-03 DIAGNOSIS — I152 Hypertension secondary to endocrine disorders: Secondary | ICD-10-CM

## 2021-09-03 DIAGNOSIS — E119 Type 2 diabetes mellitus without complications: Secondary | ICD-10-CM | POA: Diagnosis not present

## 2021-09-03 DIAGNOSIS — H524 Presbyopia: Secondary | ICD-10-CM | POA: Diagnosis not present

## 2021-09-03 DIAGNOSIS — Z23 Encounter for immunization: Secondary | ICD-10-CM | POA: Insufficient documentation

## 2021-09-03 DIAGNOSIS — E1159 Type 2 diabetes mellitus with other circulatory complications: Secondary | ICD-10-CM

## 2021-09-03 NOTE — Progress Notes (Signed)
Ht 5' 0.24" (1.53 m)   Wt 161 lb 3.2 oz (73.1 kg)   BMI 31.24 kg/m    Subjective:    Patient ID: Amanda [name], female    DOB: 07/22/1956, 65 y.o.   MRN: [id number]  Chief Complaint  Patient presents with   Hypertension   Diabetes    HPI: Amanda [name] is a 65 y.o. female  Pt seen ophthalmology today for Dm eye exam.   Hypertension This is a chronic problem. The current episode started more than 1 year ago. The problem has been resolved since onset. The problem is controlled. Pertinent negatives include no blurred vision, chest pain, headaches, malaise/fatigue, neck pain, palpitations, peripheral edema, PND or shortness of breath.  Diabetes She presents for her follow-up diabetic visit. She has type 2 diabetes mellitus. Pertinent negatives for hypoglycemia include no confusion, dizziness, headaches, speech difficulty or tremors. Pertinent negatives for diabetes include no blurred vision, no chest pain, no fatigue, no polydipsia, no polyphagia, no polyuria and no weakness.   Chief Complaint  Patient presents with   Hypertension   Diabetes    Relevant past medical, surgical, family and social history reviewed and updated as indicated. Interim medical history since our last visit reviewed. Allergies and medications reviewed and updated.  Review of Systems  Constitutional:  Negative for activity change, appetite change, chills, fatigue, fever and malaise/fatigue.  HENT:  Negative for congestion, ear discharge, ear pain and facial swelling.   Eyes:  Negative for blurred vision, pain and itching.  Respiratory:  Negative for cough, chest tightness, shortness of breath and wheezing.   Cardiovascular:  Negative for chest pain, palpitations, leg swelling and PND.  Gastrointestinal:  Negative for abdominal distention, abdominal pain, blood in stool, constipation, diarrhea, nausea and vomiting.  Endocrine: Negative for cold intolerance, heat intolerance, polydipsia, polyphagia and  polyuria.  Genitourinary:  Negative for difficulty urinating, dysuria, flank pain, frequency, hematuria and urgency.  Musculoskeletal:  Negative for arthralgias, gait problem, joint swelling, myalgias and neck pain.  Skin:  Negative for color change, rash and wound.  Neurological:  Negative for dizziness, tremors, speech difficulty, weakness, light-headedness, numbness and headaches.  Hematological:  Does not bruise/bleed easily.  Psychiatric/Behavioral:  Negative for agitation, confusion, decreased concentration, sleep disturbance and suicidal ideas.    Per HPI unless specifically indicated above     Objective:    Ht 5' 0.24" (1.53 m)   Wt 161 lb 3.2 oz (73.1 kg)   BMI 31.24 kg/m   Wt Readings from Last 3 Encounters:  09/03/21 161 lb 3.2 oz (73.1 kg)  05/03/21 168 lb (76.2 kg)  04/02/21 169 lb 3.2 oz (76.7 kg)    Physical Exam Vitals and nursing note reviewed.  Constitutional:      General: She is not in acute distress.    Appearance: Normal appearance. She is not ill-appearing or diaphoretic.  Eyes:     Conjunctiva/sclera: Conjunctivae normal.  Cardiovascular:     Rate and Rhythm: Normal rate and regular rhythm.     Heart sounds: No murmur heard.   No friction rub.  Pulmonary:     Effort: No respiratory distress.     Breath sounds: No stridor. No rhonchi.  Abdominal:     General: Abdomen is flat. Bowel sounds are normal. There is no distension.     Palpations: Abdomen is soft. There is no mass.     Tenderness: There is no abdominal tenderness. There is no guarding.  Skin:    General:  Skin is warm and dry.     Coloration: Skin is not jaundiced.     Findings: No erythema.  Neurological:     Mental Status: She is alert.    Results for orders placed or performed in visit on 08/30/21  Lipid panel  Result Value Ref Range   Cholesterol, Total 142 100 - 199 mg/dL   Triglycerides 163 (H) 0 - 149 mg/dL   HDL 50 >39 mg/dL   VLDL Cholesterol Cal 28 5 - 40 mg/dL   LDL Chol  Calc (NIH) 64 0 - 99 mg/dL   Chol/HDL Ratio 2.8 0.0 - 4.4 ratio  CBC with Differential/Platelet  Result Value Ref Range   WBC 6.5 3.4 - 10.8 x10E3/uL   RBC 3.59 (L) 3.77 - 5.28 x10E6/uL   Hemoglobin 11.3 11.1 - 15.9 g/dL   Hematocrit 33.1 (L) 34.0 - 46.6 %   MCV 92 79 - 97 fL   MCH 31.5 26.6 - 33.0 pg   MCHC 34.1 31.5 - 35.7 g/dL   RDW 12.9 11.7 - 15.4 %   Platelets 227 150 - 450 x10E3/uL   Neutrophils 66 Not Estab. %   Lymphs 25 Not Estab. %   Monocytes 5 Not Estab. %   Eos 3 Not Estab. %   Basos 1 Not Estab. %   Neutrophils Absolute 4.3 1.4 - 7.0 x10E3/uL   Lymphocytes Absolute 1.6 0.7 - 3.1 x10E3/uL   Monocytes Absolute 0.3 0.1 - 0.9 x10E3/uL   EOS (ABSOLUTE) 0.2 0.0 - 0.4 x10E3/uL   Basophils Absolute 0.0 0.0 - 0.2 x10E3/uL   Immature Granulocytes 0 Not Estab. %   Immature Grans (Abs) 0.0 0.0 - 0.1 x10E3/uL  Thyroid Panel With TSH  Result Value Ref Range   TSH 1.640 0.450 - 4.500 uIU/mL   T4, Total 7.7 4.5 - 12.0 ug/dL   T3 Uptake Ratio 27 24 - 39 %   Free Thyroxine Index 2.1 1.2 - 4.9  Bayer DCA Hb A1c Waived  Result Value Ref Range   HB A1C (BAYER DCA - WAIVED) 6.7 (H) 4.8 - 5.6 %  Comprehensive metabolic panel  Result Value Ref Range   Glucose 128 (H) 70 - 99 mg/dL   BUN 23 8 - 27 mg/dL   Creatinine, Ser 1.07 (H) 0.57 - 1.00 mg/dL   eGFR 57 (L) >59 mL/min/1.73   BUN/Creatinine Ratio 21 12 - 28   Sodium 141 134 - 144 mmol/L   Potassium 4.4 3.5 - 5.2 mmol/L   Chloride 101 96 - 106 mmol/L   CO2 27 20 - 29 mmol/L   Calcium 10.0 8.7 - 10.3 mg/dL   Total Protein 6.7 6.0 - 8.5 g/dL   Albumin 4.3 3.8 - 4.8 g/dL   Globulin, Total 2.4 1.5 - 4.5 g/dL   Albumin/Globulin Ratio 1.8 1.2 - 2.2   Bilirubin Total 0.3 0.0 - 1.2 mg/dL   Alkaline Phosphatase 80 44 - 121 IU/L   AST 21 0 - 40 IU/L   ALT 16 0 - 32 IU/L        Current Outpatient Medications:    aspirin 81 MG EC tablet, Take 81 mg by mouth daily. Swallow whole., Disp: , Rfl:    benazepril (LOTENSIN) 40 MG  tablet, Take 1 tablet (40 mg total) by mouth daily., Disp: 90 tablet, Rfl: 4   Calcium Carb-Cholecalciferol (CALCIUM 600+D3) 600-200 MG-UNIT TABS, Take 1 tablet by mouth daily., Disp: , Rfl:    carvedilol (COREG) 25 MG tablet, Take 1 tablet (  25 mg total) by mouth 2 (two) times daily with a meal., Disp: 180 tablet, Rfl: 4   Empagliflozin-metFORMIN HCl ER (SYNJARDY XR) 12.02-999 MG TB24, Take 1 tablet by mouth 2 (two) times daily., Disp: 180 tablet, Rfl: 4   hydrochlorothiazide (HYDRODIURIL) 25 MG tablet, Take 1 tablet (25 mg total) by mouth daily., Disp: 90 tablet, Rfl: 4   lovastatin (MEVACOR) 20 MG tablet, Take 2 tablets (40 mg total) by mouth at bedtime., Disp: 180 tablet, Rfl: 4   meloxicam (MOBIC) 15 MG tablet, Take 1 tablet (15 mg total) by mouth daily. Only to use prn, Disp: 30 tablet, Rfl: 2   Multiple Vitamin (MULTIVITAMIN) tablet, Take 1 tablet by mouth daily., Disp: , Rfl:    omeprazole (PRILOSEC) 20 MG capsule, Take 20 mg by mouth daily., Disp: , Rfl:    traZODone (DESYREL) 50 MG tablet, Take 1 tablet (50 mg total) by mouth at bedtime., Disp: 90 tablet, Rfl: 4   TRULICITY 3 KR/8.3KF SOPN, INJECT 1 SYRINGE (3MG) SUBCUTANEOUSLY ONCE A WEEK AS DIRECTED, Disp: 12 mL, Rfl: 0    Assessment & Plan:  Dm is on trulicity 3 mg and synjardy  A1c better - much better.  check HbA1c,  urine  microalbumin  diabetic diet plan given to pt  adviced regarding hypoglycemia and instructions given to pt today on how to prevent and treat the same if it were to occur. pt acknowledges the plan and voices understanding of the same.  exercise plan given and encouraged.   advice diabetic yearly podiatry, ophthalmology , nutritionist , dental check q 6 months  Ref. Range 04/02/2021 15:24 04/02/2021 15:29 08/30/2021 08:18  Glucose Latest Ref Range: 65 - 99 mg/dL  98   HB A1C (BAYER DCA - WAIVED) Latest Ref Range: 4.8 - 5.6 % 6.9  6.7 (H)   2. HTN is on hctz and benazepril for such  Continue current meds.  Medication  compliance emphasised. pt advised to keep Bp logs. Pt verbalised understanding of the same. Pt to have a low salt diet . Exercise to reach a goal of at least 150 mins a week.  lifestyle modifications explained and pt understands importance of the above.  3. HLD  TG high - 163  recheck FLP, check LFT's work on diet, SE of meds explained to pt. low fat and high fiber diet explained to pt.   Problem List Items Addressed This Visit   None Visit Diagnoses     Need for influenza vaccination    -  Primary   Relevant Orders   Flu Vaccine QUAD High Dose(Fluad) (Completed)        Orders Placed This Encounter  Procedures   Flu Vaccine QUAD High Dose(Fluad)     No orders of the defined types were placed in this encounter.    Follow up plan: No follow-ups on file.

## 2021-09-19 ENCOUNTER — Other Ambulatory Visit: Payer: Self-pay | Admitting: Nurse Practitioner

## 2021-09-19 DIAGNOSIS — E1121 Type 2 diabetes mellitus with diabetic nephropathy: Secondary | ICD-10-CM

## 2021-09-20 NOTE — Telephone Encounter (Signed)
Requested Prescriptions  Pending Prescriptions Disp Refills  . traZODone (DESYREL) 50 MG tablet [Pharmacy Med Name: traZODone HCl 50 MG Oral Tablet] 90 tablet 1    Sig: TAKE 1 TABLET BY MOUTH AT BEDTIME     Psychiatry: Antidepressants - Serotonin Modulator Passed - 09/19/2021  5:39 AM      Passed - Valid encounter within last 6 months    Recent Outpatient Visits          2 weeks ago Need for influenza vaccination   [facility] Vigg, [name], MD   4 months ago Hypertension associated with diabetes ([place])   [name], [name], MD   5 months ago Diabetes mellitus without complication ([place])   [name], [name], MD   8 months ago Type 2 diabetes with nephropathy ([place])   [place], [name], NP   1 year ago Type 2 diabetes with nephropathy ([place])   [place], Barbaraann Faster, NP      Future Appointments            In 2 months Vigg, [name], MD [name]           . SYNJARDY XR 12.02-999 MG TB24 [Pharmacy Med Name: Synjardy XR 12.02-999 MG Oral Tablet Extended Release 24 Hour] 180 tablet 1    Sig: Take 1 tablet by mouth twice daily     Endocrinology:  Diabetes - Biguanide + SGLT2 Inhibitor Combos Failed - 09/19/2021  5:39 AM      Failed - Cr in normal range and within 360 days    Creatinine, Ser  Date Value Ref Range Status  08/30/2021 1.07 (H) 0.57 - 1.00 mg/dL Final         Failed - eGFR in normal range and within 360 days    GFR calc Af Amer  Date Value Ref Range Status  09/05/2020 59 (L) >59 mL/min/1.73 Final    Comment:    **In accordance with recommendations from the NKF-ASN Task force,**   [facility] is in the process of updating its eGFR calculation to the   2021 CKD-EPI creatinine equation that estimates kidney function   without a race variable.    GFR calc non Af Amer  Date Value Ref Range Status  09/05/2020 51 (L) >59 mL/min/1.73 Final   eGFR  Date  Value Ref Range Status  08/30/2021 57 (L) >59 mL/min/1.73 Final         Passed - LDL in normal range and within 360 days    LDL Chol Calc (NIH)  Date Value Ref Range Status  08/30/2021 64 0 - 99 mg/dL Final         Passed - HBA1C is between 0 and 7.9 and within 180 days    Hemoglobin A1C  Date Value Ref Range Status  06/12/2016 7.8  Final   HB A1C (BAYER DCA - WAIVED)  Date Value Ref Range Status  08/30/2021 6.7 (H) 4.8 - 5.6 % Final    Comment:             Prediabetes: 5.7 - 6.4          Diabetes: >6.4          Glycemic control for adults with diabetes: <7.0          Passed - Valid encounter within last 6 months    Recent Outpatient Visits          2 weeks ago Need for  influenza vaccination   [facility] Vigg, [name], MD   4 months ago Hypertension associated with diabetes ([place])   [name], [name], MD   5 months ago Diabetes mellitus without complication ([place])   [place], MD   8 months ago Type 2 diabetes with nephropathy ([place])   [place], [name], NP   1 year ago Type 2 diabetes with nephropathy ([place])   [place], Barbaraann Faster, NP      Future Appointments            In 2 months Vigg, [name], MD [facility], [name]

## 2021-09-23 ENCOUNTER — Other Ambulatory Visit: Payer: Self-pay | Admitting: Nurse Practitioner

## 2021-09-23 DIAGNOSIS — I152 Hypertension secondary to endocrine disorders: Secondary | ICD-10-CM

## 2021-09-23 NOTE — Telephone Encounter (Signed)
last RF 09/05/20 #180 4 RF should last until Feb 2023  Requested Prescriptions  Refused Prescriptions Disp Refills  . benazepril (LOTENSIN) 40 MG tablet [Pharmacy Med Name: Benazepril HCl 40 MG Oral Tablet] 30 tablet 0    Sig: Take 1 tablet by mouth once daily     Cardiovascular:  ACE Inhibitors Failed - 09/23/2021 10:27 AM      Failed - Cr in normal range and within 180 days    Creatinine, Ser  Date Value Ref Range Status  08/30/2021 1.07 (H) 0.57 - 1.00 mg/dL Final         Passed - K in normal range and within 180 days    Potassium  Date Value Ref Range Status  08/30/2021 4.4 3.5 - 5.2 mmol/L Final         Passed - Patient is not pregnant      Passed - Last BP in normal range    BP Readings from Last 1 Encounters:  05/03/21 138/81         Passed - Valid encounter within last 6 months    Recent Outpatient Visits          2 weeks ago Need for influenza vaccination   [facility] Vigg, [name], MD   4 months ago Hypertension associated with diabetes (HCC)   [name], [name], MD   5 months ago Diabetes mellitus without complication (HCC)   [name], [name], MD   9 months ago Type 2 diabetes with nephropathy (HCC)   [facility] [place], [place] T, NP   1 year ago Type 2 diabetes with nephropathy (HCC)   [facility] [place], [name], NP      Future Appointments            In 2 months Vigg, [name], MD [name]           . carvedilol (COREG) 25 MG tablet [Pharmacy Med Name: Carvedilol 25 MG Oral Tablet] 60 tablet 0    Sig: TAKE 1 TABLET BY MOUTH TWICE DAILY WITH MEALS     Cardiovascular:  Beta Blockers Passed - 09/23/2021 10:27 AM      Passed - Last BP in normal range    BP Readings from Last 1 Encounters:  05/03/21 138/81         Passed - Last Heart Rate in normal range    Pulse Readings from Last 1 Encounters:  05/03/21 62         Passed - Valid encounter  within last 6 months    Recent Outpatient Visits          2 weeks ago Need for influenza vaccination   [facility] Vigg, [name], MD   4 months ago Hypertension associated with diabetes (HCC)   [name], [name], MD   5 months ago Diabetes mellitus without complication (HCC)   [name], [name], MD   9 months ago Type 2 diabetes with nephropathy (HCC)   [facility] [place], [place] T, NP   1 year ago Type 2 diabetes with nephropathy (HCC)   [facility] Cannady, [name], NP      Future Appointments            In 2 months Vigg, [name], MD [name]

## 2021-09-25 ENCOUNTER — Other Ambulatory Visit: Payer: Self-pay | Admitting: Nurse Practitioner

## 2021-09-25 DIAGNOSIS — E1159 Type 2 diabetes mellitus with other circulatory complications: Secondary | ICD-10-CM

## 2021-09-25 DIAGNOSIS — I152 Hypertension secondary to endocrine disorders: Secondary | ICD-10-CM

## 2021-09-26 NOTE — Telephone Encounter (Signed)
Requested medication (s) are due for refill today: expired medications   Requested medication (s) are on the active medication list: yes   Last refill:  lotensin- 09/05/20 #90 4 refills , coreg- 09/05/20 #180 4 refills   Future visit scheduled: yes in 2 months   Notes to clinic:  expired medications , do you want to renew Rx?     Requested Prescriptions  Pending Prescriptions Disp Refills   benazepril (LOTENSIN) 40 MG tablet [Pharmacy Med Name: Benazepril HCl 40 MG Oral Tablet] 30 tablet 0    Sig: Take 1 tablet by mouth once daily     Cardiovascular:  ACE Inhibitors Failed - 09/25/2021  4:48 PM      Failed - Cr in normal range and within 180 days    Creatinine, Ser  Date Value Ref Range Status  08/30/2021 1.07 (H) 0.57 - 1.00 mg/dL Final          Passed - K in normal range and within 180 days    Potassium  Date Value Ref Range Status  08/30/2021 4.4 3.5 - 5.2 mmol/L Final          Passed - Patient is not pregnant      Passed - Last BP in normal range    BP Readings from Last 1 Encounters:  05/03/21 138/81          Passed - Valid encounter within last 6 months    Recent Outpatient Visits           3 weeks ago Need for influenza vaccination   [facility] Vigg, [name], MD   4 months ago Hypertension associated with diabetes (HCC)   [name], [name], MD   5 months ago Diabetes mellitus without complication (HCC)   [name], [name], MD   9 months ago Type 2 diabetes with nephropathy (HCC)   [facility] [place], [place] T, NP   1 year ago Type 2 diabetes with nephropathy (HCC)   [facility] Cannady, [name], NP       Future Appointments             In 2 months Vigg, [name], MD [name]             carvedilol (COREG) 25 MG tablet [Pharmacy Med Name: Carvedilol 25 MG Oral Tablet] 60 tablet 0    Sig: TAKE 1 TABLET BY MOUTH TWICE DAILY WITH MEALS      Cardiovascular:  Beta Blockers Passed - 09/25/2021  4:48 PM      Passed - Last BP in normal range    BP Readings from Last 1 Encounters:  05/03/21 138/81          Passed - Last Heart Rate in normal range    Pulse Readings from Last 1 Encounters:  05/03/21 62          Passed - Valid encounter within last 6 months    Recent Outpatient Visits           3 weeks ago Need for influenza vaccination   [facility] Vigg, [name], MD   4 months ago Hypertension associated with diabetes (HCC)   [name], [name], MD   5 months ago Diabetes mellitus without complication (HCC)   [name], [name], MD   9 months ago Type 2 diabetes with nephropathy (HCC)   [facility] [place], [place] T, NP   1 year ago  Type 2 diabetes with nephropathy (HCC)   [facility] [place], [name], NP       Future Appointments             In 2 months Vigg, [name], MD [name]

## 2021-09-26 NOTE — Telephone Encounter (Signed)
Patient last seen 09/03/21 and has appointment 12/04/21

## 2021-10-08 ENCOUNTER — Other Ambulatory Visit: Payer: Self-pay | Admitting: Nurse Practitioner

## 2021-10-08 DIAGNOSIS — I152 Hypertension secondary to endocrine disorders: Secondary | ICD-10-CM

## 2021-10-08 NOTE — Telephone Encounter (Signed)
Requested Prescriptions  Pending Prescriptions Disp Refills   hydrochlorothiazide (HYDRODIURIL) 25 MG tablet [Pharmacy Med Name: hydroCHLOROthiazide 25 MG Oral Tablet] 90 tablet 0    Sig: Take 1 tablet by mouth once daily     Cardiovascular: Diuretics - Thiazide Failed - 10/08/2021  5:30 AM      Failed - Cr in normal range and within 360 days    Creatinine, Ser  Date Value Ref Range Status  08/30/2021 1.07 (H) 0.57 - 1.00 mg/dL Final         Passed - Ca in normal range and within 360 days    Calcium  Date Value Ref Range Status  08/30/2021 10.0 8.7 - 10.3 mg/dL Final         Passed - K in normal range and within 360 days    Potassium  Date Value Ref Range Status  08/30/2021 4.4 3.5 - 5.2 mmol/L Final         Passed - Na in normal range and within 360 days    Sodium  Date Value Ref Range Status  08/30/2021 141 134 - 144 mmol/L Final         Passed - Last BP in normal range    BP Readings from Last 1 Encounters:  05/03/21 138/81         Passed - Valid encounter within last 6 months    Recent Outpatient Visits          1 month ago Need for influenza vaccination   [facility] Vigg, [name], MD   5 months ago Hypertension associated with diabetes (HCC)   [name], [name], MD   6 months ago Diabetes mellitus without complication (HCC)   [name], [name], MD   9 months ago Type 2 diabetes with nephropathy (HCC)   [facility] [place], [place] T, NP   1 year ago Type 2 diabetes with nephropathy (HCC)   [facility] Cann[place], [name], NP      Future Appointments            In 1 month Vigg, [name], MD [name]

## 2021-10-10 ENCOUNTER — Telehealth: Payer: Self-pay | Admitting: Internal Medicine

## 2021-10-10 NOTE — Telephone Encounter (Signed)
Pt had some follow up questions, pt was not aware of having to make this appt.

## 2021-10-10 NOTE — Telephone Encounter (Signed)
Copied from CRM [id number]. Topic: Medicare AWV >> Oct 10, 2021 10:28 AM Leigh Aurora wrote: Reason for CRM:  Left message for patient to call back and schedule Medicare Annual Wellness Visit (AWV) to be done virtually or by telephone.  No hx of AWV eligible as of 07/21/21  Please schedule at anytime with [facility] Health Advisor.      45 Minutes appointment   Any questions, please call me at [phone]

## 2021-10-14 ENCOUNTER — Other Ambulatory Visit: Payer: Self-pay | Admitting: Nurse Practitioner

## 2021-10-14 ENCOUNTER — Other Ambulatory Visit: Payer: Self-pay | Admitting: Internal Medicine

## 2021-10-14 DIAGNOSIS — E1169 Type 2 diabetes mellitus with other specified complication: Secondary | ICD-10-CM

## 2021-10-16 NOTE — Telephone Encounter (Signed)
Requested medication (s) are due for refill today: Yes  Requested medication (s) are on the active medication list: Yes  Last refill:  09/05/20  Future visit scheduled: Yes  Notes to clinic:  Prescription expired.    Requested Prescriptions  Pending Prescriptions Disp Refills   lovastatin (MEVACOR) 20 MG tablet [Pharmacy Med Name: Lovastatin 20 MG Oral Tablet] 120 tablet 0    Sig: TAKE 2 TABLETS BY MOUTH AT BEDTIME     Cardiovascular:  Antilipid - Statins Failed - 10/14/2021  7:22 AM      Failed - Triglycerides in normal range and within 360 days    Triglycerides  Date Value Ref Range Status  08/30/2021 163 (H) 0 - 149 mg/dL Final   Triglycerides Piccolo,Waived  Date Value Ref Range Status  07/16/2017 301 (H) <150 mg/dL Final    Comment:                            Normal                   <150                         Borderline High     150 - 199                         High                200 - 499                         Very High                >499           Passed - Total Cholesterol in normal range and within 360 days    Cholesterol, Total  Date Value Ref Range Status  08/30/2021 142 100 - 199 mg/dL Final   Cholesterol Piccolo, Waived  Date Value Ref Range Status  07/16/2017 196 <200 mg/dL Final    Comment:                            Desirable                <200                         Borderline High      200- 239                         High                     >239           Passed - LDL in normal range and within 360 days    LDL Chol Calc (NIH)  Date Value Ref Range Status  08/30/2021 64 0 - 99 mg/dL Final          Passed - HDL in normal range and within 360 days    HDL  Date Value Ref Range Status  08/30/2021 50 >39 mg/dL Final          Passed - Patient is not pregnant      Passed - Valid encounter within last 12 months  Recent Outpatient Visits           1 month ago Need for influenza vaccination   [facility] Vigg,  [name], MD   5 months ago Hypertension associated with diabetes (HCC)   [name], [name], MD   6 months ago Diabetes mellitus without complication (HCC)   [name], [name], MD   9 months ago Type 2 diabetes with nephropathy (HCC)   [facility] [place], [name], NP   1 year ago Type 2 diabetes with nephropathy (HCC)   [facility] [place], [name], NP       Future Appointments             In 1 month Vigg, [name], [name]

## 2021-10-16 NOTE — Telephone Encounter (Signed)
Requested Prescriptions  Pending Prescriptions Disp Refills   TRULICITY 3 MG/0.5ML SOPN [Pharmacy Med Name: Trulicity 3 MG/0.5ML Subcutaneous Solution Pen-injector] 12 mL 0    Sig: INJECT 1 SYRINGE SUBCUTANEOUSLY ONCE A WEEK     Endocrinology:  Diabetes - GLP-1 Receptor Agonists Passed - 10/14/2021  7:44 AM      Passed - HBA1C is between 0 and 7.9 and within 180 days    Hemoglobin A1C  Date Value Ref Range Status  06/12/2016 7.8  Final   HB A1C (BAYER DCA - WAIVED)  Date Value Ref Range Status  08/30/2021 6.7 (H) 4.8 - 5.6 % Final    Comment:             Prediabetes: 5.7 - 6.4          Diabetes: >6.4          Glycemic control for adults with diabetes: <7.0          Passed - Valid encounter within last 6 months    Recent Outpatient Visits          1 month ago Need for influenza vaccination   [name], [name], MD   5 months ago Hypertension associated with diabetes (HCC)   [name], [name], MD   6 months ago Diabetes mellitus without complication (HCC)   [name], [name], MD   9 months ago Type 2 diabetes with nephropathy (HCC)   [facility] [place], [name], NP   1 year ago Type 2 diabetes with nephropathy (HCC)   [facility] Cann[place], [name], NP      Future Appointments            In 1 month Vigg, [name], MD [name]

## 2021-11-02 ENCOUNTER — Emergency Department: Payer: Medicare HMO

## 2021-11-02 ENCOUNTER — Other Ambulatory Visit: Payer: Self-pay

## 2021-11-02 ENCOUNTER — Encounter: Payer: Self-pay | Admitting: Emergency Medicine

## 2021-11-02 ENCOUNTER — Inpatient Hospital Stay
Admission: EM | Admit: 2021-11-02 | Discharge: 2021-11-08 | DRG: 871 | Disposition: A | Discharge status: Home Health | Payer: Medicare HMO | Attending: Internal Medicine | Admitting: Internal Medicine

## 2021-11-02 ENCOUNTER — Emergency Department: Payer: Medicare HMO | Admitting: Radiology

## 2021-11-02 DIAGNOSIS — R7881 Bacteremia: Secondary | ICD-10-CM

## 2021-11-02 DIAGNOSIS — E669 Obesity, unspecified: Secondary | ICD-10-CM | POA: Diagnosis not present

## 2021-11-02 DIAGNOSIS — E785 Hyperlipidemia, unspecified: Secondary | ICD-10-CM | POA: Diagnosis present

## 2021-11-02 DIAGNOSIS — E1122 Type 2 diabetes mellitus with diabetic chronic kidney disease: Secondary | ICD-10-CM | POA: Diagnosis not present

## 2021-11-02 DIAGNOSIS — A419 Sepsis, unspecified organism: Secondary | ICD-10-CM

## 2021-11-02 DIAGNOSIS — I129 Hypertensive chronic kidney disease with stage 1 through stage 4 chronic kidney disease, or unspecified chronic kidney disease: Secondary | ICD-10-CM | POA: Diagnosis present

## 2021-11-02 DIAGNOSIS — K819 Cholecystitis, unspecified: Secondary | ICD-10-CM | POA: Diagnosis not present

## 2021-11-02 DIAGNOSIS — I2489 Other forms of acute ischemic heart disease: Secondary | ICD-10-CM

## 2021-11-02 DIAGNOSIS — Z880 Allergy status to penicillin: Secondary | ICD-10-CM

## 2021-11-02 DIAGNOSIS — E871 Hypo-osmolality and hyponatremia: Secondary | ICD-10-CM | POA: Diagnosis not present

## 2021-11-02 DIAGNOSIS — Z7985 Long-term (current) use of injectable non-insulin antidiabetic drugs: Secondary | ICD-10-CM | POA: Diagnosis not present

## 2021-11-02 DIAGNOSIS — N1831 Chronic kidney disease, stage 3a: Secondary | ICD-10-CM | POA: Diagnosis not present

## 2021-11-02 DIAGNOSIS — I248 Other forms of acute ischemic heart disease: Secondary | ICD-10-CM | POA: Diagnosis not present

## 2021-11-02 DIAGNOSIS — Z886 Allergy status to analgesic agent status: Secondary | ICD-10-CM | POA: Diagnosis not present

## 2021-11-02 DIAGNOSIS — I4891 Unspecified atrial fibrillation: Secondary | ICD-10-CM

## 2021-11-02 DIAGNOSIS — A4151 Sepsis due to Escherichia coli [E. coli]: Principal | ICD-10-CM

## 2021-11-02 DIAGNOSIS — Z87891 Personal history of nicotine dependence: Secondary | ICD-10-CM

## 2021-11-02 DIAGNOSIS — E872 Acidosis, unspecified: Secondary | ICD-10-CM | POA: Diagnosis not present

## 2021-11-02 DIAGNOSIS — Z79899 Other long term (current) drug therapy: Secondary | ICD-10-CM

## 2021-11-02 DIAGNOSIS — K219 Gastro-esophageal reflux disease without esophagitis: Secondary | ICD-10-CM | POA: Diagnosis present

## 2021-11-02 DIAGNOSIS — S0990XA Unspecified injury of head, initial encounter: Secondary | ICD-10-CM | POA: Diagnosis not present

## 2021-11-02 DIAGNOSIS — Z043 Encounter for examination and observation following other accident: Secondary | ICD-10-CM | POA: Diagnosis not present

## 2021-11-02 DIAGNOSIS — K8000 Calculus of gallbladder with acute cholecystitis without obstruction: Secondary | ICD-10-CM | POA: Diagnosis present

## 2021-11-02 DIAGNOSIS — I480 Paroxysmal atrial fibrillation: Secondary | ICD-10-CM | POA: Diagnosis not present

## 2021-11-02 DIAGNOSIS — E860 Dehydration: Secondary | ICD-10-CM | POA: Diagnosis present

## 2021-11-02 DIAGNOSIS — N179 Acute kidney failure, unspecified: Secondary | ICD-10-CM | POA: Diagnosis present

## 2021-11-02 DIAGNOSIS — M25512 Pain in left shoulder: Secondary | ICD-10-CM | POA: Diagnosis not present

## 2021-11-02 DIAGNOSIS — R197 Diarrhea, unspecified: Secondary | ICD-10-CM | POA: Diagnosis not present

## 2021-11-02 DIAGNOSIS — E119 Type 2 diabetes mellitus without complications: Secondary | ICD-10-CM

## 2021-11-02 DIAGNOSIS — N183 Chronic kidney disease, stage 3 unspecified: Secondary | ICD-10-CM

## 2021-11-02 DIAGNOSIS — E1165 Type 2 diabetes mellitus with hyperglycemia: Secondary | ICD-10-CM

## 2021-11-02 DIAGNOSIS — K810 Acute cholecystitis: Secondary | ICD-10-CM

## 2021-11-02 DIAGNOSIS — R6521 Severe sepsis with septic shock: Secondary | ICD-10-CM | POA: Diagnosis not present

## 2021-11-02 DIAGNOSIS — N1330 Unspecified hydronephrosis: Secondary | ICD-10-CM | POA: Diagnosis not present

## 2021-11-02 DIAGNOSIS — N182 Chronic kidney disease, stage 2 (mild): Secondary | ICD-10-CM | POA: Diagnosis not present

## 2021-11-02 DIAGNOSIS — D6959 Other secondary thrombocytopenia: Secondary | ICD-10-CM | POA: Diagnosis present

## 2021-11-02 DIAGNOSIS — Z7982 Long term (current) use of aspirin: Secondary | ICD-10-CM | POA: Diagnosis not present

## 2021-11-02 DIAGNOSIS — Z20822 Contact with and (suspected) exposure to covid-19: Secondary | ICD-10-CM | POA: Diagnosis not present

## 2021-11-02 DIAGNOSIS — K8020 Calculus of gallbladder without cholecystitis without obstruction: Secondary | ICD-10-CM | POA: Diagnosis not present

## 2021-11-02 DIAGNOSIS — I959 Hypotension, unspecified: Secondary | ICD-10-CM | POA: Diagnosis not present

## 2021-11-02 DIAGNOSIS — Z7984 Long term (current) use of oral hypoglycemic drugs: Secondary | ICD-10-CM | POA: Diagnosis not present

## 2021-11-02 DIAGNOSIS — Z452 Encounter for adjustment and management of vascular access device: Secondary | ICD-10-CM | POA: Diagnosis not present

## 2021-11-02 DIAGNOSIS — D61818 Other pancytopenia: Secondary | ICD-10-CM

## 2021-11-02 DIAGNOSIS — N189 Chronic kidney disease, unspecified: Secondary | ICD-10-CM | POA: Diagnosis not present

## 2021-11-02 DIAGNOSIS — Z8249 Family history of ischemic heart disease and other diseases of the circulatory system: Secondary | ICD-10-CM

## 2021-11-02 DIAGNOSIS — Z6831 Body mass index (BMI) 31.0-31.9, adult: Secondary | ICD-10-CM

## 2021-11-02 DIAGNOSIS — Z833 Family history of diabetes mellitus: Secondary | ICD-10-CM

## 2021-11-02 HISTORY — PX: IR PERC CHOLECYSTOSTOMY: IMG2326

## 2021-11-02 LAB — CBC WITH DIFFERENTIAL/PLATELET
Abs Immature Granulocytes: 0.1 10*3/uL — ABNORMAL HIGH (ref 0.00–0.07)
Basophils Absolute: 0 10*3/uL (ref 0.0–0.1)
Basophils Relative: 0 %
Eosinophils Absolute: 0 10*3/uL (ref 0.0–0.5)
Eosinophils Relative: 1 %
HCT: 34.7 % — ABNORMAL LOW (ref 36.0–46.0)
Hemoglobin: 11.7 g/dL — ABNORMAL LOW (ref 12.0–15.0)
Lymphocytes Relative: 21 %
Lymphs Abs: 0.3 10*3/uL — ABNORMAL LOW (ref 0.7–4.0)
MCH: 30 pg (ref 26.0–34.0)
MCHC: 33.7 g/dL (ref 30.0–36.0)
MCV: 89 fL (ref 80.0–100.0)
Metamyelocytes Relative: 2 %
Monocytes Absolute: 0 10*3/uL — ABNORMAL LOW (ref 0.1–1.0)
Monocytes Relative: 1 %
Myelocytes: 2 %
Neutro Abs: 0.9 10*3/uL — ABNORMAL LOW (ref 1.7–7.7)
Neutrophils Relative %: 73 %
Platelets: 129 10*3/uL — ABNORMAL LOW (ref 150–400)
RBC: 3.9 MIL/uL (ref 3.87–5.11)
RDW: 12.8 % (ref 11.5–15.5)
Smear Review: NORMAL
WBC: 1.3 10*3/uL — CL (ref 4.0–10.5)
nRBC: 0 % (ref 0.0–0.2)

## 2021-11-02 LAB — LACTIC ACID, PLASMA
Lactic Acid, Venous: 5.9 mmol/L (ref 0.5–1.9)
Lactic Acid, Venous: 4.2 mmol/L (ref 0.5–1.9)
Lactic Acid, Venous: 2.8 mmol/L (ref 0.5–1.9)

## 2021-11-02 LAB — BLOOD CULTURE ID PANEL (REFLEXED) - BCID2

## 2021-11-02 LAB — RESP PANEL BY RT-PCR (FLU A&B, COVID) ARPGX2
Influenza A by PCR: NEGATIVE
Influenza B by PCR: NEGATIVE
SARS Coronavirus 2 by RT PCR: NEGATIVE

## 2021-11-02 LAB — CBC
HCT: 34.7 % — ABNORMAL LOW (ref 36.0–46.0)
Hemoglobin: 11.8 g/dL — ABNORMAL LOW (ref 12.0–15.0)
MCH: 30.4 pg (ref 26.0–34.0)
MCHC: 34 g/dL (ref 30.0–36.0)
MCV: 89.4 fL (ref 80.0–100.0)
Platelets: 129 10*3/uL — ABNORMAL LOW (ref 150–400)
RBC: 3.88 MIL/uL (ref 3.87–5.11)
RDW: 12.7 % (ref 11.5–15.5)
WBC: 1.3 10*3/uL — CL (ref 4.0–10.5)
nRBC: 0 % (ref 0.0–0.2)

## 2021-11-02 LAB — URINALYSIS, ROUTINE W REFLEX MICROSCOPIC
Bilirubin Urine: NEGATIVE
Glucose, UA: >=500 mg/dL — AB
Ketones, ur: 5 mg/dL — AB
Leukocytes,Ua: NEGATIVE
Nitrite: NEGATIVE
Protein, ur: 100 mg/dL — AB
Specific Gravity, Urine: 1.02 (ref 1.005–1.030)
pH: 5 (ref 5.0–8.0)

## 2021-11-02 LAB — APTT: aPTT: 35 seconds (ref 24–36)

## 2021-11-02 LAB — MRSA NEXT GEN BY PCR, NASAL: MRSA by PCR Next Gen: NOT DETECTED

## 2021-11-02 LAB — COMPREHENSIVE METABOLIC PANEL
ALT: 21 U/L (ref 0–44)
AST: 45 U/L — ABNORMAL HIGH (ref 15–41)
Albumin: 3.3 g/dL — ABNORMAL LOW (ref 3.5–5.0)
Alkaline Phosphatase: 186 U/L — ABNORMAL HIGH (ref 38–126)
Anion gap: 17 — ABNORMAL HIGH (ref 5–15)
BUN: 66 mg/dL — ABNORMAL HIGH (ref 8–23)
CO2: 21 mmol/L — ABNORMAL LOW (ref 22–32)
Calcium: 8.6 mg/dL — ABNORMAL LOW (ref 8.9–10.3)
Chloride: 94 mmol/L — ABNORMAL LOW (ref 98–111)
Creatinine, Ser: 2.89 mg/dL — ABNORMAL HIGH (ref 0.44–1.00)
GFR, Estimated: 17 mL/min — ABNORMAL LOW (ref >60)
Glucose, Bld: 191 mg/dL — ABNORMAL HIGH (ref 70–99)
Potassium: 4.2 mmol/L (ref 3.5–5.1)
Sodium: 132 mmol/L — ABNORMAL LOW (ref 135–145)
Total Bilirubin: 1.7 mg/dL — ABNORMAL HIGH (ref 0.3–1.2)
Total Protein: 7.4 g/dL (ref 6.5–8.1)

## 2021-11-02 LAB — LIPASE, BLOOD: Lipase: 21 U/L (ref 11–51)

## 2021-11-02 LAB — TROPONIN I (HIGH SENSITIVITY): Troponin I (High Sensitivity): 70 ng/L — ABNORMAL HIGH (ref <18)

## 2021-11-02 LAB — HIV ANTIBODY (ROUTINE TESTING W REFLEX): HIV Screen 4th Generation wRfx: NONREACTIVE

## 2021-11-02 LAB — PROTIME-INR
INR: 1.2 (ref 0.8–1.2)
Prothrombin Time: 15.5 seconds — ABNORMAL HIGH (ref 11.4–15.2)

## 2021-11-02 LAB — GLUCOSE, CAPILLARY: Glucose-Capillary: 240 mg/dL — ABNORMAL HIGH (ref 70–99)

## 2021-11-02 LAB — PATHOLOGIST SMEAR REVIEW

## 2021-11-02 LAB — PROCALCITONIN: Procalcitonin: 143.43 ng/mL

## 2021-11-02 MED ORDER — HEPARIN SODIUM (PORCINE) 5000 UNIT/ML IJ SOLN
5000.0000 [IU] | Freq: Three times a day (TID) | INTRAMUSCULAR | Status: DC
  Administered 2021-11-02 – 2021-11-05 (×8): 5000 [IU] via SUBCUTANEOUS
  Filled 2021-11-02 (×8): qty 1

## 2021-11-02 MED ORDER — LIDOCAINE HCL 1 % IJ SOLN
INTRAMUSCULAR | Status: AC
  Administered 2021-11-02: 20 mL
  Filled 2021-11-02: qty 20

## 2021-11-02 MED ORDER — CHLORHEXIDINE GLUCONATE CLOTH 2 % EX PADS
6.0000 | MEDICATED_PAD | Freq: Every day | CUTANEOUS | Status: DC
  Administered 2021-11-02 – 2021-11-08 (×7): 6 via TOPICAL

## 2021-11-02 MED ORDER — LACTATED RINGERS IV SOLN: INTRAVENOUS | Status: AC

## 2021-11-02 MED ORDER — ACETAMINOPHEN 325 MG PO TABS
650.0000 mg | ORAL_TABLET | ORAL | Status: DC | PRN
  Administered 2021-11-05: 650 mg via ORAL
  Filled 2021-11-02: qty 2

## 2021-11-02 MED ORDER — LACTATED RINGERS IV BOLUS
1000.0000 mL | Freq: Once | INTRAVENOUS | Status: AC
  Administered 2021-11-02: 1000 mL via INTRAVENOUS

## 2021-11-02 MED ORDER — ORAL CARE MOUTH RINSE
15.0000 mL | Freq: Two times a day (BID) | OROMUCOSAL | Status: DC
  Administered 2021-11-02 – 2021-11-08 (×12): 15 mL via OROMUCOSAL

## 2021-11-02 MED ORDER — SODIUM CHLORIDE 0.9 % IV SOLN
2.0000 g | INTRAVENOUS | Status: DC
  Administered 2021-11-03 – 2021-11-05 (×3): 2 g via INTRAVENOUS
  Filled 2021-11-02 (×3): qty 2
  Filled 2021-11-02: qty 20

## 2021-11-02 MED ORDER — NOREPINEPHRINE 4 MG/250ML-% IV SOLN
0.0000 ug/min | INTRAVENOUS | Status: DC
  Administered 2021-11-02: 30 ug/min via INTRAVENOUS
  Administered 2021-11-02: 35 ug/min via INTRAVENOUS
  Filled 2021-11-02 (×2): qty 250

## 2021-11-02 MED ORDER — INSULIN ASPART 100 UNIT/ML IJ SOLN
3.0000 [IU] | Freq: Once | INTRAMUSCULAR | Status: AC
  Administered 2021-11-02: 3 [IU] via SUBCUTANEOUS
  Filled 2021-11-02: qty 1

## 2021-11-02 MED ORDER — HYDROCORTISONE SOD SUC (PF) 100 MG IJ SOLR
100.0000 mg | Freq: Three times a day (TID) | INTRAMUSCULAR | Status: DC
  Administered 2021-11-02 – 2021-11-05 (×9): 100 mg via INTRAVENOUS
  Filled 2021-11-02 (×9): qty 2

## 2021-11-02 MED ORDER — PANTOPRAZOLE SODIUM 40 MG IV SOLR
40.0000 mg | INTRAVENOUS | Status: DC
  Administered 2021-11-02 – 2021-11-04 (×3): 40 mg via INTRAVENOUS
  Filled 2021-11-02 (×3): qty 40

## 2021-11-02 MED ORDER — SODIUM CHLORIDE 0.9 % IV SOLN
100.0000 mg | INTRAVENOUS | Status: DC
  Administered 2021-11-03: 100 mg via INTRAVENOUS
  Filled 2021-11-02 (×2): qty 100

## 2021-11-02 MED ORDER — ONDANSETRON HCL 4 MG/2ML IJ SOLN
4.0000 mg | Freq: Once | INTRAMUSCULAR | Status: AC
Start: 2021-11-02 — End: 2021-11-02
  Administered 2021-11-02: 4 mg via INTRAVENOUS
  Filled 2021-11-02: qty 2

## 2021-11-02 MED ORDER — INSULIN ASPART 100 UNIT/ML IJ SOLN
0.0000 [IU] | INTRAMUSCULAR | Status: DC
  Administered 2021-11-03: 4 [IU] via SUBCUTANEOUS
  Administered 2021-11-03 (×2): 3 [IU] via SUBCUTANEOUS
  Administered 2021-11-03: 4 [IU] via SUBCUTANEOUS
  Administered 2021-11-04 (×2): 3 [IU] via SUBCUTANEOUS
  Administered 2021-11-04: 4 [IU] via SUBCUTANEOUS
  Administered 2021-11-04: 3 [IU] via SUBCUTANEOUS
  Filled 2021-11-02 (×8): qty 1

## 2021-11-02 MED ORDER — LACTATED RINGERS IV BOLUS
1000.0000 mL | Freq: Once | INTRAVENOUS | Status: AC
Start: 2021-11-02 — End: 2021-11-02
  Administered 2021-11-02: 1000 mL via INTRAVENOUS

## 2021-11-02 MED ORDER — ONDANSETRON HCL 4 MG/2ML IJ SOLN: 4.0000 mg | Freq: Four times a day (QID) | INTRAMUSCULAR | Status: DC | PRN

## 2021-11-02 MED ORDER — SODIUM CHLORIDE 0.9 % IV SOLN
2.0000 g | Freq: Once | INTRAVENOUS | Status: AC
  Administered 2021-11-02: 2 g via INTRAVENOUS
  Filled 2021-11-02: qty 2

## 2021-11-02 MED ORDER — NOREPINEPHRINE 4 MG/250ML-% IV SOLN
INTRAVENOUS | Status: AC
  Administered 2021-11-02: 4 mg
  Filled 2021-11-02: qty 250

## 2021-11-02 MED ORDER — METRONIDAZOLE 500 MG/100ML IV SOLN
500.0000 mg | Freq: Two times a day (BID) | INTRAVENOUS | Status: DC
  Administered 2021-11-02 – 2021-11-03 (×3): 500 mg via INTRAVENOUS
  Filled 2021-11-02 (×4): qty 100

## 2021-11-02 MED ORDER — SODIUM CHLORIDE 0.9 % IV SOLN
200.0000 mg | Freq: Once | INTRAVENOUS | Status: AC
  Administered 2021-11-02: 200 mg via INTRAVENOUS
  Filled 2021-11-02: qty 200

## 2021-11-02 MED ORDER — NOREPINEPHRINE 4 MG/250ML-% IV SOLN: 2.0000 ug/min | INTRAVENOUS | Status: DC

## 2021-11-02 MED ORDER — METRONIDAZOLE 500 MG/100ML IV SOLN
500.0000 mg | Freq: Once | INTRAVENOUS | Status: AC
  Administered 2021-11-02: 500 mg via INTRAVENOUS
  Filled 2021-11-02: qty 100

## 2021-11-02 MED ORDER — INSULIN ASPART 100 UNIT/ML IJ SOLN
0.0000 [IU] | INTRAMUSCULAR | Status: DC
  Administered 2021-11-02 (×2): 5 [IU] via SUBCUTANEOUS
  Filled 2021-11-02 (×2): qty 1

## 2021-11-02 MED ORDER — FENTANYL CITRATE (PF) 100 MCG/2ML IJ SOLN
INTRAMUSCULAR | Status: AC
  Filled 2021-11-02: qty 2

## 2021-11-02 MED ORDER — SODIUM CHLORIDE 0.9 % IV BOLUS
1000.0000 mL | Freq: Once | INTRAVENOUS | Status: AC
  Administered 2021-11-02: 1000 mL via INTRAVENOUS

## 2021-11-02 MED ORDER — LIDOCAINE HCL (PF) 1 % IJ SOLN
INTRAMUSCULAR | Status: AC
  Filled 2021-11-02: qty 5

## 2021-11-02 MED ORDER — SODIUM CHLORIDE 0.9 % IV SOLN: 250.0000 mL | INTRAVENOUS | Status: DC

## 2021-11-02 MED ORDER — POLYETHYLENE GLYCOL 3350 17 G PO PACK: 17.0000 g | PACK | Freq: Every day | ORAL | Status: DC | PRN

## 2021-11-02 MED ORDER — FENTANYL CITRATE (PF) 100 MCG/2ML IJ SOLN
INTRAMUSCULAR | Status: AC | PRN
Start: 2021-11-02 — End: 2021-11-02
  Administered 2021-11-02 (×2): 25 ug via INTRAVENOUS

## 2021-11-02 MED ORDER — SODIUM CHLORIDE 0.9% FLUSH
5.0000 mL | Freq: Three times a day (TID) | INTRAVENOUS | Status: DC
  Administered 2021-11-02 – 2021-11-07 (×15): 5 mL

## 2021-11-02 MED ORDER — DOCUSATE SODIUM 100 MG PO CAPS: 100.0000 mg | ORAL_CAPSULE | Freq: Two times a day (BID) | ORAL | Status: DC | PRN

## 2021-11-02 MED ORDER — MIDAZOLAM HCL 2 MG/2ML IJ SOLN
INTRAMUSCULAR | Status: AC
  Filled 2021-11-02: qty 2

## 2021-11-02 MED ORDER — IOHEXOL 350 MG/ML SOLN
5.0000 mL | Freq: Once | INTRAVENOUS | Status: AC | PRN
  Administered 2021-11-02: 5 mL

## 2021-11-02 MED ORDER — ACETAMINOPHEN 500 MG PO TABS
1000.0000 mg | ORAL_TABLET | Freq: Once | ORAL | Status: AC
  Administered 2021-11-02: 1000 mg via ORAL
  Filled 2021-11-02: qty 2

## 2021-11-02 MED ORDER — MIDAZOLAM HCL 2 MG/2ML IJ SOLN
INTRAMUSCULAR | Status: AC | PRN
Start: 2021-11-02 — End: 2021-11-02
  Administered 2021-11-02 (×2): .5 mg via INTRAVENOUS

## 2021-11-02 MED ORDER — NOREPINEPHRINE 16 MG/250ML-% IV SOLN
0.0000 ug/min | INTRAVENOUS | Status: DC
  Administered 2021-11-02: 35 ug/min via INTRAVENOUS
  Administered 2021-11-03: 12 ug/min via INTRAVENOUS
  Administered 2021-11-03: 14 ug/min via INTRAVENOUS
  Administered 2021-11-03: 18 ug/min via INTRAVENOUS
  Administered 2021-11-03: 32 ug/min via INTRAVENOUS
  Administered 2021-11-03: 10 ug/min via INTRAVENOUS
  Administered 2021-11-03: 8 ug/min via INTRAVENOUS
  Administered 2021-11-03: 10 ug/min via INTRAVENOUS
  Administered 2021-11-03: 6 ug/min via INTRAVENOUS
  Administered 2021-11-03: 12 ug/min via INTRAVENOUS
  Administered 2021-11-03: 16 ug/min via INTRAVENOUS
  Filled 2021-11-02 (×2): qty 250

## 2021-11-02 MED ORDER — VASOPRESSIN 20 UNITS/100 ML INFUSION FOR SHOCK
0.0400 [IU]/min | INTRAVENOUS | Status: DC
  Administered 2021-11-02 – 2021-11-03 (×4): 0.04 [IU]/min via INTRAVENOUS
  Filled 2021-11-02 (×4): qty 100

## 2021-11-02 MED ORDER — MORPHINE SULFATE (PF) 4 MG/ML IV SOLN
4.0000 mg | Freq: Once | INTRAVENOUS | Status: AC
  Administered 2021-11-02: 4 mg via INTRAVENOUS
  Filled 2021-11-02: qty 1

## 2021-11-02 NOTE — Progress Notes (Signed)
CODE SEPSIS - PHARMACY COMMUNICATION  **Broad Spectrum Antibiotics should be administered within 1 hour of Sepsis diagnosis**  Time Code Sepsis Called/Page Received: 1032  Antibiotics Ordered: Cefepime + metronidazole  Time of 1st antibiotic administration: 1049  Additional action taken by pharmacy: N/A  Amanda [name] 11/02/2021  10:35 AM

## 2021-11-02 NOTE — Consult Note (Signed)
PHARMACY -  BRIEF ANTIBIOTIC NOTE   Pharmacy has received consult(s) for cefepime from an ED provider. Patient is also ordered metronidazole.  The patient's profile has been reviewed for ht/wt/allergies/indication/available labs.    One time order(s) placed for  --Cefepime 2 g IV  Further antibiotics/pharmacy consults should be ordered by admitting physician if indicated.                       Thank you, Benita Gutter 11/02/2021  10:35 AM

## 2021-11-02 NOTE — ED Triage Notes (Signed)
Vomiting and chills 3 days ago.  Then patient states she fell onto the floor.  Continues having vomiting and diarrhea.

## 2021-11-02 NOTE — Consult Note (Signed)
[facility] SUR[facility] CONSULTATION NOTE (initial) - [id number]   HISTORY OF PRESENT ILLNESS (HPI):  65 y.o. female presented to [facility] ED today for evaluation of emesis. Patient reports a 3-4 day history of generalized abdominal pain, nausea, emesis, diarrhea, and decreased PO intake. She reports her abdominal pain initially started on the left side but is now some what diffuse. No fever, chills, cough, SOC, or CP reported. She did not try anything for this. She denied any history of similar in the past. No previous intra-abdominal surgeries. Work up in the ED was concerning for leukopenia to 1.3K, AKI with sCr - 2.89 (baseline 1), mild hyperbilirubinemia to 1.7, and lactic acidosis to 5.3. She was also hypotensive requiring norepinephrine in the ED. She underwent CT Abdomen/Pelvis which was concerning for possible cholecystitis. She was started on cefepime and flagyl.   Surgery is consulted by emergency medicine physician Dr. Vladimir Crofts, MD in this context for evaluation and management of possible cholecystitis.  PAST MEDICAL HISTORY (PMH):  Past Medical History:  Diagnosis Date   Diabetes mellitus without complication ([place])    Hyperlipidemia    Hypertension      PAST SURGICAL HISTORY ([place]):  Past Surgical History:  Procedure Laterality Date   COLONOSCOPY  2012     MEDICATIONS:  Prior to Admission medications   Medication Sig Start Date End Date Taking? Authorizing Provider  benazepril (LOTENSIN) 40 MG tablet Take 1 tablet by mouth once daily 09/26/21   Cannady, [name], NP  carvedilol (COREG) 25 MG tablet TAKE 1 TABLET BY MOUTH TWICE DAILY WITH MEALS 09/26/21   Cannady, Henrine Screws T, NP  aspirin 81 MG EC tablet Take 81 mg by mouth daily. Swallow whole.    [provider]  Calcium Carb-Cholecalciferol (CALCIUM 600+D3) 600-200 MG-UNIT TABS Take 1 tablet by mouth daily.    [provider]  hydrochlorothiazide (HYDRODIURIL) 25 MG tablet Take 1 tablet by mouth  once daily 10/08/21   Vigg, [name], MD  lovastatin (MEVACOR) 20 MG tablet TAKE 2 TABLETS BY MOUTH AT BEDTIME 10/16/21   Cannady, [name], NP  meloxicam (MOBIC) 15 MG tablet Take 1 tablet (15 mg total) by mouth daily. Only to use prn 05/03/21   Vigg, [name], MD  Multiple Vitamin (MULTIVITAMIN) tablet Take 1 tablet by mouth daily.    [provider]  omeprazole (PRILOSEC) 20 MG capsule Take 20 mg by mouth daily.    [provider]  SYNJARDY XR 12.02-999 MG TB24 Take 1 tablet by mouth twice daily 09/20/21   Vigg, [name], MD  traZODone (DESYREL) 50 MG tablet TAKE 1 TABLET BY MOUTH AT BEDTIME 09/20/21   Vigg, [name], MD  TRULICITY 3 0000000 SOPN INJECT 1 SYRINGE SUBCUTANEOUSLY ONCE A WEEK 10/16/21   Vigg, [name], MD     ALLERGIES:  Allergies  Allergen Reactions   Penicillins    Tramadol Other (See Comments)    "woozy"     SOCIAL HISTORY:  Social History   Socioeconomic History   Marital status: Single    Spouse name: Not on file   Number of children: Not on file   Years of education: Not on file   Highest education level: Not on file  Occupational History   Not on file  Tobacco Use   Smoking status: Former    Types: Cigarettes    Quit date: 12/07/1975    Years since quitting: 45.9   Smokeless tobacco: Never  Vaping Use   Vaping Use: Never used  Substance  and Sexual Activity   Alcohol use: No   Drug use: No   Sexual activity: Not on file  Other Topics Concern   Not on file  Social History Narrative   Not on file   Social Determinants of Health   Financial Resource Strain: Not on file  Food Insecurity: Not on file  Transportation Needs: Not on file  Physical Activity: Not on file  Stress: Not on file  Social Connections: Not on file  Intimate Partner Violence: Not on file     FAMILY HISTORY:  Family History  Problem Relation Age of Onset   Hypertension Mother    Diabetes Mother    Diabetes Sister    Heart disease Sister    Heart disease Sister     Cancer Niece       REVIEW OF SYSTEMS:  Review of Systems  Constitutional:  Positive for malaise/fatigue. Negative for chills and fever.  HENT:  Negative for congestion and sore throat.   Respiratory:  Negative for shortness of breath.   Cardiovascular:  Negative for chest pain and palpitations.  Gastrointestinal:  Positive for abdominal pain, diarrhea, nausea and vomiting.  Genitourinary:  Negative for dysuria, hematuria and urgency.  All other systems reviewed and are negative.  VITAL SIGNS:  Temp:  [98 F (36.7 C)-101 F (38.3 C)] 99.7 F (37.6 C) (01/13 1050) Pulse Rate:  [106-126] 106 (01/13 1050) Resp:  [22-50] 22 (01/13 1050) BP: (98-101)/(64-67) 101/67 (01/13 0921) SpO2:  [95 %-97 %] 95 % (01/13 1050) Weight:  [73.1 kg] 73.1 kg (01/13 0904)     Height: 5' 0.24" (153 cm) Weight: 73.1 kg BMI (Calculated): 31.23   INTAKE/OUTPUT:  No intake/output data recorded.  PHYSICAL EXAM:  Physical Exam Vitals and nursing note reviewed. Exam conducted with a chaperone present.  Constitutional:      General: She is not in acute distress.    Appearance: Normal appearance. She is obese. She is ill-appearing.  HENT:     Head: Normocephalic and atraumatic.     Mouth/Throat:     Mouth: Mucous membranes are moist.     Pharynx: Oropharynx is clear.  Eyes:     General: Scleral icterus (Mild) present.  Cardiovascular:     Rate and Rhythm: Regular rhythm. Tachycardia present.     Pulses: Normal pulses.  Pulmonary:     Effort: Pulmonary effort is normal. No respiratory distress.  Abdominal:     General: There is no distension.     Palpations: Abdomen is soft.     Tenderness: There is abdominal tenderness in the right upper quadrant and epigastric area. There is left CVA tenderness. There is no right CVA tenderness, guarding or rebound.     Comments: Abdomen is soft, she is tender somewhat diffusely but definitely worse in RUQ, no rebound/guarding. She also has left flank tenderness  as well.   Genitourinary:    Comments: Deferred Musculoskeletal:     Right lower leg: Edema (Trace) present.     Left lower leg: Edema (Trace) present.  Skin:    General: Skin is warm and dry.     Coloration: Skin is not jaundiced.  Neurological:     General: No focal deficit present.     Mental Status: She is alert and oriented to person, place, and time.  Psychiatric:        Mood and Affect: Mood normal.        Behavior: Behavior normal.     Labs:  CBC Latest Ref  Rng & Units 11/02/2021 11/02/2021 08/30/2021  WBC 4.0 - 10.5 K/uL 1.3(LL) 1.3(LL) 6.5  Hemoglobin 12.0 - 15.0 g/dL 11.7(L) 11.8(L) 11.3  Hematocrit 36.0 - 46.0 % 34.7(L) 34.7(L) 33.1(L)  Platelets 150 - 400 K/uL 129(L) 129(L) 227   CMP Latest Ref Rng & Units 11/02/2021 08/30/2021 04/02/2021  Glucose 70 - 99 mg/dL 191(H) 128(H) 98  BUN 8 - 23 mg/dL 66(H) 23 19  Creatinine 0.44 - 1.00 mg/dL 2.89(H) 1.07(H) 1.07(H)  Sodium 135 - 145 mmol/L 132(L) 141 141  Potassium 3.5 - 5.1 mmol/L 4.2 4.4 3.9  Chloride 98 - 111 mmol/L 94(L) 101 100  CO2 22 - 32 mmol/L 21(L) 27 23  Calcium 8.9 - 10.3 mg/dL 8.6(L) 10.0 9.5  Total Protein 6.5 - 8.1 g/dL 7.4 6.7 6.5  Total Bilirubin 0.3 - 1.2 mg/dL 1.7(H) 0.3 0.4  Alkaline Phos 38 - 126 U/L 186(H) 80 63  AST 15 - 41 U/L 45(H) 21 23  ALT 0 - 44 U/L 21 16 16     Imaging studies:   CT Abdomen/Pelvis (11/02/2021) personally reviewed which does show a distended gallbladder with cholelithiasis and per-cholecystitic fluid definitely concerning for cholecystitis, there is also left perinephritic stranding as well, and radiologist report reviewed below:  IMPRESSION: 1. Cholelithiasis with gallbladder distension and gallbladder wall thickening concerning for acute cholecystitis. 2. 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 Recommend adrenal washout CT or chemical shift MRI. JACR 2017 Aug; 14(8):1038-44, JCAT 2016  Mar-Apr; 40(2):194-200, Urol J 2006 Spring; 3(2):71-4. 3.  Aortic Atherosclerosis (ICD10-I70.0).    Assessment/Plan: (ICD-10's: K81.0) 66 y.o. female with sepsis likely secondary to acute cholecystitis and [possible concomitant urosepsis awaiting work up.   - Agree with admission to ICU secondary to sepsis requiring vasopressor support - Given her current condition and presentation, she is not an optimal surgical candidate at this time. I did review case with IR for cholecystostomy tube who is in agreement. They will plan to expedite this procedure given this is likely a major contributor to her sepsis. Greatly appreciate their help with this case.    - Continue NPO + IVF Support - Continue IV Abx (Cefepime + Flagyl)   - Pain control prn; antiemetics prn - Work up to rule out urinary source   - Further management per primary service; we will follow   All of the above findings and recommendations were discussed with the patient and her family (sister at bedside), and all of patient's and her family's questions were answered to their expressed satisfaction.  Thank you for the opportunity to participate in this patient's care.   -- Edison Simon, PA-C  [facility] 11/02/2021, 12:24 PM [phone] M-F: 7am - 4pm

## 2021-11-02 NOTE — Consult Note (Signed)
PHARMACY CONSULT NOTE  Pharmacy Consult for Electrolyte Monitoring and Replacement   Recent Labs: Potassium (mmol/L)  Date Value  11/02/2021 4.2   Calcium (mg/dL)  Date Value  41/96/2229 8.6 (L)   Albumin (g/dL)  Date Value  79/89/2119 3.3 (L)  08/30/2021 4.3   Sodium (mmol/L)  Date Value  11/02/2021 132 (L)  08/30/2021 141   Assessment: Patient is a 66 y/o F with medical history including HTN, HLD, GERD, obesity, diabetes who is admitted with septic shock secondary to acute cholecystitis. Patient had percutaneous cholecystostomy tube placed emergently by IR 1/13. Patient disposition is the ICU. Pharmacy consulted to assist with electrolyte monitoring and replacement.  Goal of Therapy:  Electrolytes within normal limits  Plan:  --Labs notable for Scr 2.89 (BL Scr ~ 1). Patient receiving fluids per sepsis protocol --No electrolyte replacement indicated at this time --Follow-up electrolytes with AM labs tomorrow  Tressie Ellis 11/02/2021 2:18 PM

## 2021-11-02 NOTE — Sepsis Progress Note (Signed)
eLink monitoring sepsis protocol °

## 2021-11-02 NOTE — H&P (Signed)
NAME:  Amanda [name], MRN:  [id number], DOB:  08-Nov-1955, LOS: 0 ADMISSION DATE:  11/02/2021, CONSULTATION DATE:  11/02/2021 REFERRING MD:  Dr. Tamala Julian, CHIEF COMPLAINT:  Abdominal Pain, N/V   Brief Pt Description / Synopsis:  66 y.o. female admitted with Septic Shock in the setting of Acute Cholecystitis due to Cholelithiasis, s/p percutaneous cholecystostomy tube placement 1/13 by IR.  General Surgery following.  History of Present Illness:  Amanda [name] is a 66 year old female with a past medical history significant for hypertension, hyperlipidemia, GERD, CKD, diabetes mellitus, and obesity who presents to [facility] ED on 11/02/2021 due to a 3 to 4-day history of nausea, vomiting, diarrhea, abdominal pain and cramping.  Patient is currently lethargic after receiving sedation, therefore history is obtained from patient's sister at bedside along with chart review.  It is reported that she was having at least 5 episodes per day of emesis and diarrhea, described as watery, nonbloody, nonbilious, and no hematochezia or melena.  Given her abdominal symptoms she had poor p.o. intake with concerns for dehydration.  She denied chest pain, shortness of breath, fever, palpitations, dizziness, cough, dysuria.  She also reported a mechanical fall 3 days ago when she tripped over her feet with resultant fall onto her left side.  Denied syncope was able to get back up and with ambulating at baseline since then.  ED Course: Initial vital signs: Temperature 101 Fahrenheit orally, respiratory rate 25, pulse 115, blood pressure 101/67, 97% SPO2 on room air Significant labs: Sodium 132, chloride 94, bicarbonate 21, glucose 191, BUN 66, creatinine 2.89, anion gap 17, alkaline phosphatase 186, lipase 21, AST 45, ALT 21, total bilirubin 1.7, lactic acid 5.9, procalcitonin 143, WBC 1.3 with mild left shift, hemoglobin 11.7, hematocrit 34.7, platelets 129 with normal morphology, PT 15.5, INR 1.2 Imaging: Chest x-Dimaio: No radiographic  evidence of acute cardiopulmonary process. X-Sh[place]felt Left shoulder:No acute osseous injury of the left shoulder. CT abdomen and pelvis:IMPRESSION: 1. Cholelithiasis with gallbladder distension and gallbladder wall thickening concerning for acute cholecystitis. 2. 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 Recommend adrenal washout CT or chemical shift MRI. JACR 2017 Aug; 14(8):1038-44, JCAT 2016 Mar-Apr; 40(2):194-200, Urol J 2006 Spring; 3(2):71-4. 3.  Aortic Atherosclerosis CT head without contrast:No acute intracranial pathology. Medications administered: 3 L of IV fluids, cefepime and Flagyl, Levophed infusion  She met sepsis criteria therefore IV fluids and broad-spectrum antibiotics administered.  Given concern for acute cholecystitis, general surgery evaluated the patient and recommended IR to place percutaneous Cholecystostomy tube. ED provider placed central line due to vasopressor requirements.  PCCM is asked to admit the patient to ICU for further work-up and treatment of  Septic Shock in the setting of Acute Cholecystitis due to Cholelithiasis, s/p percutaneous cholecystostomy tube placement 1/13 by IR.    Pertinent  Medical History  Hypertension Hyperlipidemia GERD Diabetes mellitus Obesity  Micro Data:  11/02/2021: SARS-CoV-2 and influenza PCR>> negative 11/02/2021: Blood culture x2>> 11/02/2021: Urine>> 11/02/2021: Fluid from cholecystostomy tube>>  Antimicrobials:  Cefepime 1/13 x 1 dose Flagyl 1/13>> Ceftriaxone 1/13>> Eraxis 1/13>>  Significant Hospital Events: Including procedures, antibiotic start and stop dates in addition to other pertinent events   11/02/2021: Presented to ED, found to have acute cholecystitis.  General surgery evaluated,  IR performed cholecystostomy tube.  PCCM asked to admit due to vasopressor requirements  Interim History / Subjective:  -Patient presented with  abdominal pain, nausea, vomiting ~found to have acute  cholecystitis -Remained hypotensive despite IV fluid resuscitation, placed on vasopressors -General surgery evaluated ~IR performed cholecystostomy tube. -PCCM asked to admit -Seen at bedside following cholecystostomy tube placement, slightly lethargic as she received fentanyl and Versed during the procedure -Requiring Levophed and vasopressin infusions, critically ill -Sister updated at bedside  Objective   Blood pressure (!) 76/59, pulse 100, temperature 99.7 F (37.6 C), temperature source Oral, resp. rate (!) 30, height 5' 0.24" (1.53 m), weight 73.1 kg, SpO2 98 %.       No intake or output data in the 24 hours ending 11/02/21 1412 Filed Weights   11/02/21 0904  Weight: 73.1 kg    Examination: General: Critically ill-appearing female, laying in bed, on room air, no acute distress HENT: Atraumatic, normocephalic, neck supple, no JVD Lungs: Clear breath sounds throughout, no wheezing or rales noted, even, nonlabored, no accessory muscle use Cardiovascular: Tachycardia, regular rhythm, S1-S2, no murmurs, rubs, gallops Abdomen: Obese, soft, extremely tender, slightly distended, no guarding or rebound tenderness Extremities: Normal bulk and tone, no deformities, no edema Neuro: Difficult to perform neuro exam due to administration recently of fentanyl and Versed for procedure.  Will arouse to voice and nod to questions. GU: Deferred  Resolved Hospital Problem list     Assessment & Plan:   Septic Shock PMHx: Hypertension, Hyperlipidemia -Continuous cardiac monitoring -Maintain MAP >65 -IV fluids, LR @ 150 cc/hr (received 3L of IVF bolues in ED) -Vasopressors as needed to maintain MAP goal -Stress dose steroids -Trend lactic acid until normalized (5.9 ~ 4.2 ~ ) -Trend HS Troponin until peaked  Severe Sepsis in the setting of Acute Cholecystis (Met SIRS Criteria at admission: Temperature 101 F, RR 25, HR 115, WBC 1.3,  Lactic 5.9) -Monitor fever curve -Trend WBC's & Procalcitonin -Follow cultures as above -Continue empiric Ceftriaxone, Flagyl, & Eraxis pending cultures & sensitivities -s/p percutaneous cholecystostomy tube placement 1/13 for source control  Acute Cholecystitis due to Cholelithiasis, s/p percutaneous cholecystostomy tube placement 1/13 PMHx: GERD -NPO -IV fluids -ABX as above -Pain control prn; antiemetics prn -General Surgery following, appreciate input -Care/Management of Cholecystostomy tube as per IR & General surgery -IV Protonix daily  Pancytopenia, suspect Leukocytopenia & Thrombocytopenia due to Sepsis Normochromic Normocytic Anemia without overt s/sx of bleeding -Monitor for S/Sx of bleeding -Trend CBC -Heparin SQ for VTE Prophylaxis  -Transfuse for Hgb <7 -Smear review with unremarkable RBC morphology, normal platelet morphology, WBC's with mild left shift  Acute Kidney Injury superimposed on CKD Anion Gap Metabolic Acidosis in setting of Lactic Acidosis Mild Hyponatremia, suspect from dehydration and poor PO intake -Monitor I&O's / urinary output -Follow BMP -Ensure adequate renal perfusion -Avoid nephrotoxic agents as able -Replace electrolytes as indicated -IV fluids -Trend lactic acid  Diabetes Mellitus -CBG's q4h; Target range of 140 to 180 -SSI -Follow ICU Hypo/Hyperglycemia protocol -Check Hgb A1c     Best Practice (right click and "Reselect all SmartList Selections" daily)   Diet/type: NPO DVT prophylaxis: prophylactic heparin  GI prophylaxis: PPI Lines: Central line and yes and it is still needed Foley:  N/A Code Status:  full code Last date of multidisciplinary goals of care discussion [N/A]  Labs   CBC: Recent Labs  Lab 11/02/21 0943  WBC 1.3*   1.3*  NEUTROABS 0.9*  HGB 11.7*   11.8*  HCT 34.7*   34.7*  MCV 89.0   89.4  PLT 129*   129*    Basic Metabolic Panel: Recent Labs  Lab 11/02/21 0943  NA 132*  K 4.2  CL 94*  CO2  21*  GLUCOSE 191*  BUN 66*  CREATININE 2.89*  CALCIUM 8.6*   GFR: Estimated Creatinine Clearance: 17.2 mL/min (A) (by C-G formula based on SCr of 2.89 mg/dL (H)). Recent Labs  Lab 11/02/21 0943 11/02/21 1121  PROCALCITON 143.43  --   WBC 1.3*   1.3*  --   LATICACIDVEN 5.9* 4.2*    Liver Function Tests: Recent Labs  Lab 11/02/21 0943  AST 45*  ALT 21  ALKPHOS 186*  BILITOT 1.7*  PROT 7.4  ALBUMIN 3.3*   Recent Labs  Lab 11/02/21 0943  LIPASE 21   No results for input(s): AMMONIA in the last 168 hours.  ABG No results found for: PHART, PCO2ART, PO2ART, HCO3, TCO2, ACIDBASEDEF, O2SAT   Coagulation Profile: Recent Labs  Lab 11/02/21 0943  INR 1.2    Cardiac Enzymes: No results for input(s): CKTOTAL, CKMB, CKMBINDEX, TROPONINI in the last 168 hours.  HbA1C: Hemoglobin A1C  Date/Time Value Ref Range Status  06/12/2016 12:00 AM 7.8  Final   HB A1C (BAYER DCA - WAIVED)  Date/Time Value Ref Range Status  08/30/2021 08:18 AM 6.7 (H) 4.8 - 5.6 % Final    Comment:             Prediabetes: 5.7 - 6.4          Diabetes: >6.4          Glycemic control for adults with diabetes: <7.0   04/02/2021 03:24 PM 6.9 <7.0 % Final    Comment:                                          Diabetic Adult            <7.0                                       Healthy Adult        4.3 - 5.7                                                           (DCCT/NGSP) American Diabetes Association's Summary of Glycemic Recommendations for Adults with Diabetes: Hemoglobin A1c <7.0%. More stringent glycemic goals (A1c <6.0%) may further reduce complications at the cost of increased risk of hypoglycemia.     CBG: No results for input(s): GLUCAP in the last 168 hours.  Review of Systems:   Positives in BOLD: Gen: Denies fever, chills, weight change, fatigue, night sweats HEENT: Denies blurred vision, double vision, hearing loss, tinnitus, sinus congestion, rhinorrhea, sore throat, neck  stiffness, dysphagia PULM: Denies shortness of breath, cough, sputum production, hemoptysis, wheezing CV: Denies chest pain, edema, orthopnea, paroxysmal nocturnal dyspnea, palpitations GI: Denies abdominal pain, nausea, vomiting, diarrhea, hematochezia, melena, constipation, change in bowel habits GU: Denies dysuria, hematuria, polyuria, oliguria, urethral discharge Endocrine: Denies hot or cold intolerance, polyuria, polyphagia or appetite change Derm: Denies rash, dry skin, scaling or peeling skin change Heme: Denies easy bruising, bleeding, bleeding gums Neuro: Denies headache, numbness, weakness, slurred speech, loss of memory or consciousness   Past Medical History:  She,  has a past medical history of Diabetes mellitus without complication ([place]), Hyperlipidemia, and Hypertension.   Surgical History:   Past Surgical History:  Procedure Laterality Date   COLONOSCOPY  2012     Social History:   reports that she quit smoking about 45 years ago. Her smoking use included cigarettes. She has never used smokeless tobacco. She reports that she does not drink alcohol and does not use drugs.   Family History:  Her family history includes Cancer in her niece; Diabetes in her mother and sister; Heart disease in her sister and sister; Hypertension in her mother.   Allergies Allergies  Allergen Reactions   Penicillins    Tramadol Other (See Comments)    "woozy"     Home Medications  Prior to Admission medications   Medication Sig Start Date End Date Taking? Authorizing Provider  benazepril (LOTENSIN) 40 MG tablet Take 1 tablet by mouth once daily 09/26/21   Cannady, [name], NP  carvedilol (COREG) 25 MG tablet TAKE 1 TABLET BY MOUTH TWICE DAILY WITH MEALS 09/26/21   Cannady, Henrine Screws T, NP  aspirin 81 MG EC tablet Take 81 mg by mouth daily. Swallow whole.    [provider]  Calcium Carb-Cholecalciferol (CALCIUM 600+D3) 600-200 MG-UNIT TABS Take 1 tablet by mouth daily.     [provider]  hydrochlorothiazide (HYDRODIURIL) 25 MG tablet Take 1 tablet by mouth once daily 10/08/21   Vigg, [name], MD  lovastatin (MEVACOR) 20 MG tablet TAKE 2 TABLETS BY MOUTH AT BEDTIME 10/16/21   Cannady, [name], NP  meloxicam (MOBIC) 15 MG tablet Take 1 tablet (15 mg total) by mouth daily. Only to use prn 05/03/21   Vigg, [name], MD  Multiple Vitamin (MULTIVITAMIN) tablet Take 1 tablet by mouth daily.    [provider]  omeprazole (PRILOSEC) 20 MG capsule Take 20 mg by mouth daily.    [provider]  SYNJARDY XR 12.02-999 MG TB24 Take 1 tablet by mouth twice daily 09/20/21   Vigg, [name], MD  traZODone (DESYREL) 50 MG tablet TAKE 1 TABLET BY MOUTH AT BEDTIME 09/20/21   Vigg, [name], MD  TRULICITY 3 EA/5.4UJ SOPN INJECT 1 SYRINGE SUBCUTANEOUSLY ONCE A WEEK 10/16/21   Charlynne Cousins, MD     Critical care time: 50 minutes     Darel Hong, AGACNP-BC [facility] Prefer epic messenger for cross cover needs If after hours, please call E-link

## 2021-11-02 NOTE — Procedures (Signed)
Interventional Radiology Procedure Note  Date of Procedure: 11/02/2021  Procedure: Cholecystostomy tube placement   Findings:  1. Successful placement of 12Fr percutaneous cholecystostomy tube    Complications: No immediate complications noted.   Estimated Blood Loss: minimal  Follow-up and Recommendations: 1. Keep to bag drainage  2. Follow up with surgery RE surgical candidacy  3. Can plan for tube check and exchange in 6 weeks if no interval cholecystectomy    Olive Bass, MD  Vascular & Interventional Radiology  11/02/2021 2:14 PM

## 2021-11-02 NOTE — ED Provider Notes (Signed)
[facility] Provider Note    Event Date/Time   First MD Initiated Contact with Patient 11/02/21 [phone]     (approximate)   History   Emesis   HPI  Amanda [name] is a 66 y.o. female who presents to the ED for evaluation of Emesis   Review outpatient PCP visit from 7/14.  History of HTN, HLD, GERD, CKD and DM.  Obesity.  Patient presents to the ED, accompanied by her niece who provides some supplemental history, for evaluation of 3-4 days of nausea, vomiting and diarrhea with associated abdominal pain and cramping.  She reports primarily emesis and diarrhea, at least 5 episodes per day of both, nonbloody nonbilious emesis and watery diarrhea without hematochezia or melena.  She has had poor p.o. intake and concern for dehydration.  She reports a slowing of these with only 2 episodes this morning of both emesis and diarrhea, but due to continued generalized abdominal discomfort and pain she presents to the ED for evaluation.  Reports decreased urination without dysuria, hematuria. She did not know that she had a fever.  No chest pain, shortness of breath, cough, syncop.   Does report a mechanical fall 3 days ago when she tripped up on her feet, causing her to fall onto her left side.  No syncope.  She was able to get back up and has ambulated at baseline since then.    Physical Exam   Triage Vital Signs: ED Triage Vitals  Enc Vitals Group     BP 11/02/21 0906 98/64     Pulse Rate 11/02/21 0906 (!) 126     Resp 11/02/21 0906 (!) 50     Temp 11/02/21 0906 98 F (36.7 C)     Temp Source 11/02/21 0906 Oral     SpO2 11/02/21 0909 97 %     Weight 11/02/21 0904 161 lb 2.5 oz (73.1 kg)     Height 11/02/21 0904 5' 0.24" (1.53 m)     Head Circumference --      Peak Flow --      Pain Score 11/02/21 0904 0     Pain Loc --      Pain Edu? --      Excl. in [place]? --     Most recent vital signs: Vitals:   11/02/21 1348 11/02/21 1356  BP: (!) 82/38 (!) 83/53   Pulse: (!) 106 (!) 103  Resp: (!) 28 (!) 25  Temp:    SpO2:  99%    General: Awake, no distress.  Appears dry and uncomfortable CV:  Good peripheral perfusion.  Tachycardic and regular. Resp:  Normal effort.  Tachypnea to the mid 20s with clear lungs. Abd:  No distention.  Diffuse tenderness with intermittent voluntary guarding.  Seems more tender on the right side of the abdomen. MSK:  No deformity noted.  Warm to the touch. Neuro:  No focal deficits appreciated. Other:     ED Results / Procedures / Treatments   Labs (all labs ordered are listed, but only abnormal results are displayed) Labs Reviewed  COMPREHENSIVE METABOLIC PANEL - Abnormal; Notable for the following components:      Result Value   Sodium 132 (*)    Chloride 94 (*)    CO2 21 (*)    Glucose, Bld 191 (*)    BUN 66 (*)    Creatinine, Ser 2.89 (*)    Calcium 8.6 (*)    Albumin 3.3 (*)    AST  45 (*)    Alkaline Phosphatase 186 (*)    Total Bilirubin 1.7 (*)    GFR, Estimated 17 (*)    Anion gap 17 (*)    All other components within normal limits  CBC - Abnormal; Notable for the following components:   WBC 1.3 (*)    Hemoglobin 11.8 (*)    HCT 34.7 (*)    Platelets 129 (*)    All other components within normal limits  LACTIC ACID, PLASMA - Abnormal; Notable for the following components:   Lactic Acid, Venous 5.9 (*)    All other components within normal limits  LACTIC ACID, PLASMA - Abnormal; Notable for the following components:   Lactic Acid, Venous 4.2 (*)    All other components within normal limits  PROTIME-INR - Abnormal; Notable for the following components:   Prothrombin Time 15.5 (*)    All other components within normal limits  CBC WITH DIFFERENTIAL/PLATELET - Abnormal; Notable for the following components:   WBC 1.3 (*)    Hemoglobin 11.7 (*)    HCT 34.7 (*)    Platelets 129 (*)    Neutro Abs 0.9 (*)    Lymphs Abs 0.3 (*)    Monocytes Absolute 0.0 (*)    Abs Immature Granulocytes  0.10 (*)    All other components within normal limits  RESP PANEL BY RT-PCR (FLU A&B, COVID) ARPGX2  CULTURE, BLOOD (ROUTINE X 2)  CULTURE, BLOOD (ROUTINE X 2)  URINE CULTURE  LIPASE, BLOOD  PROCALCITONIN  APTT  URINALYSIS, ROUTINE W REFLEX MICROSCOPIC  DIFFERENTIAL    EKG Sinus tachycardia, rate of 109 bpm.  Normal axis and intervals.  No evidence of acute ischemia.  RADIOLOGY I reviewed CT abdomen/pelvis with signs of cholecystitis.  Perinephric stranding is also noted. Plain film of the chest and left shoulder reviewed by me without evidence of acute cardiopulmonary pathology and bony pathology, respectively. Repeat CXR reviewed by me with IJ in good position. CT head reviewed by me without evidence of acute intracranial pathology.  Official radiology report(s): CT ABDOMEN PELVIS WO CONTRAST  Result Date: 11/02/2021 CLINICAL DATA:  Nausea, vomiting, diarrhea, acute kidney injury, vomiting and chills for 3 days EXAM: CT ABDOMEN AND PELVIS WITHOUT CONTRAST TECHNIQUE: Multidetector CT imaging of the abdomen and pelvis was performed following the standard protocol without IV contrast. COMPARISON:  None. FINDINGS: Lower chest: Trace left pleural effusion. Coronary artery atherosclerosis. Hepatobiliary: No focal hepatic mass. Distended gallbladder with cholelithiasis. Gallbladder wall thickening. No intrahepatic or extrahepatic biliary ductal dilatation. Pancreas: Unremarkable. No pancreatic ductal dilatation or surrounding inflammatory changes. Spleen: Normal in size without focal abnormality. Adrenals/Urinary Tract: 2.4 cm left adrenal mass measuring 21 Hounsfield units of indeterminate etiology. 2.6 cm right adrenal mass of indeterminate etiology measuring 24 Hounsfield units. No urolithiasis. Mild left hydroureteronephrosis. Bilateral perinephric fat stranding. Normal bladder. Stomach/Bowel: Stomach is within normal limits. Appendix appears normal. No evidence of bowel wall thickening,  distention, or inflammatory changes. Multiple fluid-filled loops of small bowel. Vascular/Lymphatic: Aortic atherosclerosis. No enlarged abdominal or pelvic lymph nodes. Reproductive: Uterus and bilateral adnexa are unremarkable. Other: No abdominal wall hernia or abnormality. No abdominopelvic ascites. Musculoskeletal: No acute osseous abnormality. No aggressive osseous lesion. Degenerative disease with disc height loss and facet arthropathy throughout the thoracolumbar spine. IMPRESSION: 1. Cholelithiasis with gallbladder distension and gallbladder wall thickening concerning for acute cholecystitis. 2. 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 Recommend adrenal washout CT  or chemical shift MRI. JACR 2017 Aug; 14(8):1038-44, JCAT 2016 Mar-Apr; 40(2):194-200, Urol J 2006 Spring; 3(2):71-4. 3.  Aortic Atherosclerosis (ICD10-I70.0). Electronically Signed   By: Kathreen Devoid M.D.   On: 11/02/2021 12:04   DG Chest 1 View  Result Date: 11/02/2021 CLINICAL DATA:  Right-sided central line placement EXAM: CHEST  1 VIEW COMPARISON:  Chest radiograph 11/02/2021 FINDINGS: There is a new right IJ central venous catheter terminating at the cavoatrial junction. The cardiomediastinal silhouette is stable. Lung aeration is unchanged, with no new or worsening focal airspace disease. There is no pleural effusion or pneumothorax There is no acute osseous abnormality. IMPRESSION: New right IJ central venous catheter terminating at the cavoatrial junction. No pneumothorax. Electronically Signed   By: Valetta Mole M.D.   On: 11/02/2021 13:34   CT HEAD WO CONTRAST (5MM)  Result Date: 11/02/2021 CLINICAL DATA:  Vomiting and chills for 3 days.  Head trauma. EXAM: CT HEAD WITHOUT CONTRAST TECHNIQUE: Contiguous axial images were obtained from the base of the skull through the vertex without intravenous contrast. RADIATION DOSE REDUCTION: This exam was  performed according to the departmental dose-optimization program which includes automated exposure control, adjustment of the mA and/or kV according to patient size and/or use of iterative reconstruction technique. COMPARISON:  None. FINDINGS: Brain: No evidence of acute infarction, hemorrhage, hydrocephalus, extra-axial collection or mass lesion/mass effect. Vascular: No hyperdense vessel or unexpected calcification. Skull: No osseous abnormality. Sinuses/Orbits: Opacification of the right maxillary sinus. Visualized mastoid sinuses are clear. Visualized orbits demonstrate no focal abnormality. Other: None IMPRESSION: No acute intracranial pathology. Electronically Signed   By: Kathreen Devoid M.D.   On: 11/02/2021 12:07   DG Chest Port 1 View  Result Date: 11/02/2021 CLINICAL DATA:  Fall onto left side EXAM: PORTABLE CHEST 1 VIEW COMPARISON:  None. FINDINGS: The cardiomediastinal silhouette is normal. There is no focal consolidation or pulmonary edema. There is no pleural effusion or pneumothorax. There is no acute osseous abnormality. IMPRESSION: No radiographic evidence of acute cardiopulmonary process. Electronically Signed   By: Valetta Mole M.D.   On: 11/02/2021 10:28   DG Shoulder Left Portable  Result Date: 11/02/2021 CLINICAL DATA:  Status post fall, left shoulder pain EXAM: LEFT SHOULDER COMPARISON:  None. FINDINGS: No fracture or dislocation. Mild degenerative changes of the acromioclavicular joint. Soft tissues are normal. IMPRESSION: No acute osseous injury of the left shoulder. Electronically Signed   By: Kathreen Devoid M.D.   On: 11/02/2021 10:26    PROCEDURES and INTERVENTIONS:  .1-3 Lead EKG Interpretation Performed by: Vladimir Crofts, MD Authorized by: Vladimir Crofts, MD     Interpretation: abnormal     ECG rate:  104   ECG rate assessment: tachycardic     Rhythm: sinus tachycardia     Ectopy: none     Conduction: normal   .Critical Care Performed by: Vladimir Crofts, MD Authorized  by: Vladimir Crofts, MD   Critical care provider statement:    Critical care time (minutes):  30   Critical care time was exclusive of:  Separately billable procedures and treating other patients   Critical care was necessary to treat or prevent imminent or life-threatening deterioration of the following conditions:  Shock and sepsis   Critical care was time spent personally by me on the following activities:  Development of treatment plan with patient or surrogate, discussions with consultants, evaluation of patient's response to treatment, examination of patient, ordering and review of laboratory studies, ordering and review of radiographic studies, ordering  and performing treatments and interventions, pulse oximetry, re-evaluation of patient's condition and review of old charts .Central Line  Date/Time: 11/02/2021 2:02 PM Performed by: Vladimir Crofts, MD Authorized by: Vladimir Crofts, MD   Consent:    Consent obtained:  Verbal   Consent given by:  Patient   Risks, benefits, and alternatives were discussed: yes   Pre-procedure details:    Hand hygiene: Hand hygiene performed prior to insertion     Sterile barrier technique: All elements of maximal sterile technique followed     Skin preparation agent: Skin preparation agent completely dried prior to procedure   Anesthesia:    Anesthesia method:  Local infiltration   Local anesthetic:  Lidocaine 1% w/o epi Procedure details:    Location:  R internal jugular   Patient position:  Supine   Procedural supplies:  Triple lumen   Landmarks identified: yes     Ultrasound guidance: yes     Ultrasound guidance timing: prior to insertion and real time     Sterile ultrasound techniques: Sterile gel and sterile probe covers were used     Number of attempts:  2 (Was attempted was a blind left subclavian, unable to acquire venous blood back.  No return of air.  Switch to IJ by ultrasound)   Successful placement: yes   Post-procedure details:     Post-procedure:  Dressing applied   Assessment:  Blood return through all ports and free fluid flow   Procedure completion:  Tolerated well, no immediate complications  Medications  lactated ringers infusion ( Intravenous New Bag/Given 11/02/21 1205)  norepinephrine (LEVOPHED) 4mg  in 241mL (0.016 mg/mL) premix infusion (18 mcg/min Intravenous Rate/Dose Change 11/02/21 1249)  lidocaine (PF) (XYLOCAINE) 1 % injection (has no administration in time range)  lidocaine (XYLOCAINE) 1 % (with pres) injection (has no administration in time range)  fentaNYL (SUBLIMAZE) 100 MCG/2ML injection (has no administration in time range)  midazolam (VERSED) 2 MG/2ML injection (has no administration in time range)  vasopressin (PITRESSIN) 20 Units in sodium chloride 0.9 % 100 mL infusion-*FOR SHOCK* (0.04 Units/min Intravenous New Bag/Given 11/02/21 1351)  midazolam (VERSED) injection (0.5 mg Intravenous Given 11/02/21 1355)  fentaNYL (SUBLIMAZE) injection (25 mcg Intravenous Given 11/02/21 1355)  anidulafungin (ERAXIS) 200 mg in sodium chloride 0.9 % 200 mL IVPB (has no administration in time range)  anidulafungin (ERAXIS) 100 mg in sodium chloride 0.9 % 100 mL IVPB (has no administration in time range)  ondansetron (ZOFRAN) injection 4 mg (4 mg Intravenous Given 11/02/21 0954)  lactated ringers bolus 1,000 mL (0 mLs Intravenous Stopped 11/02/21 1124)  acetaminophen (TYLENOL) tablet 1,000 mg (1,000 mg Oral Given 11/02/21 0953)  morphine 4 MG/ML injection 4 mg (4 mg Intravenous Given 11/02/21 0954)  sodium chloride 0.9 % bolus 1,000 mL (0 mLs Intravenous Stopped 11/02/21 1124)  ceFEPIme (MAXIPIME) 2 g in sodium chloride 0.9 % 100 mL IVPB (0 g Intravenous Stopped 11/02/21 1159)  metroNIDAZOLE (FLAGYL) IVPB 500 mg (0 mg Intravenous Stopped 11/02/21 1305)  lactated ringers bolus 1,000 mL (1,000 mLs Intravenous Bolus 11/02/21 1200)     IMPRESSION / MDM / ASSESSMENT AND PLAN / ED COURSE  I reviewed the triage vital signs and  the nursing notes.  66 year old female presents to the ED with a few days of nausea, vomiting and emesis, found of evidence of septic shock due to acute cholecystitis requiring ICU admission for further resuscitation.  She looks dry and unwell on exam.  She is tachycardic with hypotensive pressures.  Tachycardia slowly  improved with fluid resuscitation, but she remains hypotensive criteria for septic shock and necessitating vasopressors.  He was rapidly titrated up to about 25 mics per minute while in the ED, so I placed a central line to facilitate this.  She is neutropenic, I suspect a reaction to her severe infection.  Lactic acidosis is clearing with resuscitation.  Procalcitonin is quite remarkably elevated.  Metabolic panel with a prerenal AKI on CKD.  CXR without acute cardiopulmonary pathology, but CT abdomen demonstrates signs of cholecystitis and possibly pyelonephritis as the infectious etiology.  Urine is pending at the time of transfer to IR for percutaneous cholecystostomy tube.  Broad-spectrum antibiotics by protocols would cover for infectious etiology.  I consulted with surgery who help facilitate IR drainage and patient will be admitted to the ICU after this.  Clinical Course as of 11/02/21 1402  Fri Nov 02, 2021  1120 Reassessed.  Sister now at bedside.  Somewhat soft pressures, third liter of fluids started and CT is pending.  Clinically similar reports feeling better after IV medications [DS]  1216 Reassessed. Still hypotensive, starting pressors, called ICU and surgery [DS]  1218 I discuss with Dr. Hampton Abbot, he agrees the patient is too systemically ill for cholecystectomy today.  He recommends intensivist admission and percutaneous drain with IR once she is stable to do so. [DS]  1400 I get in touch with Dr. Mortimer Fries in the ICU and relay pertinent information.  He agrees to admit after the [place] tube is placed. [DS]    Clinical Course User Index [DS] Vladimir Crofts, MD     FINAL  CLINICAL IMPRESSION(S) / ED DIAGNOSES   Final diagnoses:  Septic shock ([place])  Cholecystitis     Rx / DC Orders   ED Discharge Orders     None        Note:  This document was prepared using Dragon voice recognition software and may include unintentional dictation errors.   Vladimir Crofts, MD 11/02/21 1416

## 2021-11-02 NOTE — H&P (Signed)
Chief Complaint: Acute Cholecystitis  Referring Physician(s): Henrene Dodge  Supervising Physician: Pernell Dupre  Patient Status: [facility] - ED  History of Present Illness: Amanda [name] is a 66 y.o. female who presented to the ED with 3 days of vomiting and chills.  CT scan showed= Cholelithiasis with gallbladder distension and gallbladder wall thickening concerning for acute cholecystitis.  She is currently tachycardic and hypotensive c/w sepsis.  General surgery has evaluated patient - given her current clinical status, we are asked to proceed with a percutaneous cholecystostomy.  Past Medical History:  Diagnosis Date   Diabetes mellitus without complication (HCC)    Hyperlipidemia    Hypertension     Past Surgical History:  Procedure Laterality Date   COLONOSCOPY  2012    Allergies: Penicillins and Tramadol  Medications: Prior to Admission medications   Medication Sig Start Date End Date Taking? Authorizing Provider  benazepril (LOTENSIN) 40 MG tablet Take 1 tablet by mouth once daily 09/26/21   Cannady, [name], NP  carvedilol (COREG) 25 MG tablet TAKE 1 TABLET BY MOUTH TWICE DAILY WITH MEALS 09/26/21   Cannady, Corrie Dandy T, NP  aspirin 81 MG EC tablet Take 81 mg by mouth daily. Swallow whole.    [provider]  Calcium Carb-Cholecalciferol (CALCIUM 600+D3) 600-200 MG-UNIT TABS Take 1 tablet by mouth daily.    [provider]  hydrochlorothiazide (HYDRODIURIL) 25 MG tablet Take 1 tablet by mouth once daily 10/08/21   Vigg, [name], MD  lovastatin (MEVACOR) 20 MG tablet TAKE 2 TABLETS BY MOUTH AT BEDTIME 10/16/21   Cannady, [name], NP  meloxicam (MOBIC) 15 MG tablet Take 1 tablet (15 mg total) by mouth daily. Only to use prn 05/03/21   Vigg, [name], MD  Multiple Vitamin (MULTIVITAMIN) tablet Take 1 tablet by mouth daily.    [provider]  omeprazole (PRILOSEC) 20 MG capsule Take 20 mg by mouth daily.    [provider]   SYNJARDY XR 12.02-999 MG TB24 Take 1 tablet by mouth twice daily 09/20/21   Vigg, [name], MD  traZODone (DESYREL) 50 MG tablet TAKE 1 TABLET BY MOUTH AT BEDTIME 09/20/21   Vigg, [name], MD  TRULICITY 3 MG/0.5ML SOPN INJECT 1 SYRINGE SUBCUTANEOUSLY ONCE A WEEK 10/16/21   Vigg, [name], MD     Family History  Problem Relation Age of Onset   Hypertension Mother    Diabetes Mother    Diabetes Sister    Heart disease Sister    Heart disease Sister    Cancer Niece     Social History   Socioeconomic History   Marital status: Single    Spouse name: Not on file   Number of children: Not on file   Years of education: Not on file   Highest education level: Not on file  Occupational History   Not on file  Tobacco Use   Smoking status: Former    Types: Cigarettes    Quit date: 12/07/1975    Years since quitting: 45.9   Smokeless tobacco: Never  Vaping Use   Vaping Use: Never used  Substance and Sexual Activity   Alcohol use: No   Drug use: No   Sexual activity: Not on file  Other Topics Concern   Not on file  Social History Narrative   Not on file   Social Determinants of Health   Financial Resource Strain: Not on file  Food Insecurity: Not on file  Transportation Needs: Not on file  Physical Activity: Not on file  Stress: Not on file  Social Connections: Not on file     Review of Systems: A 12 point ROS discussed and pertinent positives are indicated in the HPI above.  All other systems are negative.  Review of Systems  Vital Signs: BP 101/67    Pulse (!) 106    Temp 99.7 F (37.6 C) (Oral)    Resp (!) 22    Ht 5' 0.24" (1.53 m)    Wt 73.1 kg    SpO2 95%    BMI 31.22 kg/m   Physical Exam Vitals reviewed.  HENT:     Head: Normocephalic and atraumatic.  Eyes:     Extraocular Movements: Extraocular movements intact.  Cardiovascular:     Rate and Rhythm: Regular rhythm. Tachycardia present.  Pulmonary:     Effort: Pulmonary effort is normal. No respiratory  distress.     Breath sounds: Normal breath sounds.  Abdominal:     Palpations: Abdomen is soft.  Musculoskeletal:        General: Normal range of motion.     Cervical back: Normal range of motion.  Skin:    General: Skin is warm and dry.  Neurological:     General: No focal deficit present.     Mental Status: She is alert and oriented to person, place, and time.  Psychiatric:        Mood and Affect: Mood normal.        Behavior: Behavior normal.        Thought Content: Thought content normal.        Judgment: Judgment normal.    Imaging: CT ABDOMEN PELVIS WO CONTRAST  Result Date: 11/02/2021 CLINICAL DATA:  Nausea, vomiting, diarrhea, acute kidney injury, vomiting and chills for 3 days EXAM: CT ABDOMEN AND PELVIS WITHOUT CONTRAST TECHNIQUE: Multidetector CT imaging of the abdomen and pelvis was performed following the standard protocol without IV contrast. COMPARISON:  None. FINDINGS: Lower chest: Trace left pleural effusion. Coronary artery atherosclerosis. Hepatobiliary: No focal hepatic mass. Distended gallbladder with cholelithiasis. Gallbladder wall thickening. No intrahepatic or extrahepatic biliary ductal dilatation. Pancreas: Unremarkable. No pancreatic ductal dilatation or surrounding inflammatory changes. Spleen: Normal in size without focal abnormality. Adrenals/Urinary Tract: 2.4 cm left adrenal mass measuring 21 Hounsfield units of indeterminate etiology. 2.6 cm right adrenal mass of indeterminate etiology measuring 24 Hounsfield units. No urolithiasis. Mild left hydroureteronephrosis. Bilateral perinephric fat stranding. Normal bladder. Stomach/Bowel: Stomach is within normal limits. Appendix appears normal. No evidence of bowel wall thickening, distention, or inflammatory changes. Multiple fluid-filled loops of small bowel. Vascular/Lymphatic: Aortic atherosclerosis. No enlarged abdominal or pelvic lymph nodes. Reproductive: Uterus and bilateral adnexa are unremarkable. Other: No  abdominal wall hernia or abnormality. No abdominopelvic ascites. Musculoskeletal: No acute osseous abnormality. No aggressive osseous lesion. Degenerative disease with disc height loss and facet arthropathy throughout the thoracolumbar spine. IMPRESSION: 1. Cholelithiasis with gallbladder distension and gallbladder wall thickening concerning for acute cholecystitis. 2. 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 Recommend adrenal washout CT or chemical shift MRI. JACR 2017 Aug; 14(8):1038-44, JCAT 2016 Mar-Apr; 40(2):194-200, Urol J 2006 Spring; 3(2):71-4. 3.  Aortic Atherosclerosis (ICD10-I70.0). Electronically Signed   By: Elige Ko M.D.   On: 11/02/2021 12:04   CT HEAD WO CONTRAST ( )  Result Date: 11/02/2021 CLINICAL DATA:  Vomiting and chills for 3 days.  Head trauma. EXAM: CT HEAD WITHOUT CONTRAST TECHNIQUE: Contiguous axial images were  obtained from the base of the skull through the vertex without intravenous contrast. RADIATION DOSE REDUCTION: This exam was performed according to the departmental dose-optimization program which includes automated exposure control, adjustment of the mA and/or kV according to patient size and/or use of iterative reconstruction technique. COMPARISON:  None. FINDINGS: Brain: No evidence of acute infarction, hemorrhage, hydrocephalus, extra-axial collection or mass lesion/mass effect. Vascular: No hyperdense vessel or unexpected calcification. Skull: No osseous abnormality. Sinuses/Orbits: Opacification of the right maxillary sinus. Visualized mastoid sinuses are clear. Visualized orbits demonstrate no focal abnormality. Other: None IMPRESSION: No acute intracranial pathology. Electronically Signed   By: Elige Ko M.D.   On: 11/02/2021 12:07   DG Chest Port 1 View  Result Date: 11/02/2021 CLINICAL DATA:  Fall onto left side EXAM: PORTABLE CHEST 1 VIEW COMPARISON:  None. FINDINGS: The  cardiomediastinal silhouette is normal. There is no focal consolidation or pulmonary edema. There is no pleural effusion or pneumothorax. There is no acute osseous abnormality. IMPRESSION: No radiographic evidence of acute cardiopulmonary process. Electronically Signed   By: Lesia Hausen M.D.   On: 11/02/2021 10:28   DG Shoulder Left Portable  Result Date: 11/02/2021 CLINICAL DATA:  Status post fall, left shoulder pain EXAM: LEFT SHOULDER COMPARISON:  None. FINDINGS: No fracture or dislocation. Mild degenerative changes of the acromioclavicular joint. Soft tissues are normal. IMPRESSION: No acute osseous injury of the left shoulder. Electronically Signed   By: Elige Ko M.D.   On: 11/02/2021 10:26    Labs:  CBC: Recent Labs    08/30/21 0822 11/02/21 0943  WBC 6.5 1.3*   1.3*  HGB 11.3 11.7*   11.8*  HCT 33.1* 34.7*   34.7*  PLT 227 129*   129*    COAGS: Recent Labs    11/02/21 0943  INR 1.2  APTT 35    BMP: Recent Labs    12/25/20 1512 04/02/21 1529 08/30/21 0822 11/02/21 0943  NA 145* 141 141 132*  K 4.0 3.9 4.4 4.2  CL 101 100 101 94*  CO2 25 23 27  21*  GLUCOSE 110* 98 128* 191*  BUN 22 19 23  66*  CALCIUM 9.8 9.5 10.0 8.6*  CREATININE 1.00 1.07* 1.07* 2.89*  GFRNONAA  --   --   --  17*    LIVER FUNCTION TESTS: Recent Labs    12/25/20 1512 04/02/21 1529 08/30/21 0822 11/02/21 0943  BILITOT 0.3 0.4 0.3 1.7*  AST 23 23 21  45*  ALT 16 16 16 21   ALKPHOS 71 63 80 186*  PROT 6.9 6.5 6.7 7.4  ALBUMIN 4.5 4.1 4.3 3.3*    TUMOR MARKERS: No results for input(s): AFPTM, CEA, CA199, CHROMGRNA in the last 8760 hours.  Assessment and Plan:  Sepsis, hypotension secondary to acute cholecystitis.  Will urgently proceed with image guided percutaneous cholecystostomy by Dr. 13/10/22.  Risks and benefits discussed with the patient including, but not limited to bleeding, infection, gallbladder perforation, bile leak, sepsis or even death.  All of the patient's  questions were answered, patient is agreeable to proceed. Consent signed and in chart.  Thank you for allowing our service to participate in CHEVI LIM 's care.  Electronically Signed: , PA-C   11/02/2021, 1:19 PM      I spent a total of 20 Minutes in face to face in clinical consultation, greater than 50% of which was counseling/coordinating care for perc chole.

## 2021-11-02 NOTE — Progress Notes (Signed)
PHARMACY - PHYSICIAN COMMUNICATION CRITICAL VALUE ALERT - BLOOD CULTURE IDENTIFICATION (BCID)  Amanda [name] is an 66 y.o. female who presented to [facility] on 11/02/2021 with a chief complaint of septic shock  Name of physician (or Provider) Contacted: Erin Fulling  Current antibiotics: ceftriaxone 2gm daily  Changes to prescribed antibiotics recommended:  Patient is on recommended antibiotics - No changes needed  Results for orders placed or performed during the hospital encounter of 11/02/21  Blood Culture ID Panel (Reflexed) (Collected: 11/02/2021  9:46 AM)  Result Value Ref Range   Enterococcus faecalis NOT DETECTED NOT DETECTED   Enterococcus Faecium NOT DETECTED NOT DETECTED   Listeria monocytogenes NOT DETECTED NOT DETECTED   Staphylococcus species NOT DETECTED NOT DETECTED   Staphylococcus aureus (BCID) NOT DETECTED NOT DETECTED   Staphylococcus epidermidis NOT DETECTED NOT DETECTED   Staphylococcus lugdunensis NOT DETECTED NOT DETECTED   Streptococcus species NOT DETECTED NOT DETECTED   Streptococcus agalactiae NOT DETECTED NOT DETECTED   Streptococcus pneumoniae NOT DETECTED NOT DETECTED   Streptococcus pyogenes NOT DETECTED NOT DETECTED   A.calcoaceticus-baumannii NOT DETECTED NOT DETECTED   Bacteroides fragilis NOT DETECTED NOT DETECTED   Enterobacterales DETECTED (A) NOT DETECTED   Enterobacter cloacae complex NOT DETECTED NOT DETECTED   Escherichia coli DETECTED (A) NOT DETECTED   Klebsiella aerogenes NOT DETECTED NOT DETECTED   Klebsiella oxytoca NOT DETECTED NOT DETECTED   Klebsiella pneumoniae NOT DETECTED NOT DETECTED   Proteus species NOT DETECTED NOT DETECTED   Salmonella species NOT DETECTED NOT DETECTED   Serratia marcescens NOT DETECTED NOT DETECTED   Haemophilus influenzae NOT DETECTED NOT DETECTED   Neisseria meningitidis NOT DETECTED NOT DETECTED   Pseudomonas aeruginosa NOT DETECTED NOT DETECTED   Stenotrophomonas maltophilia NOT DETECTED NOT DETECTED    Candida albicans NOT DETECTED NOT DETECTED   Candida auris NOT DETECTED NOT DETECTED   Candida glabrata NOT DETECTED NOT DETECTED   Candida krusei NOT DETECTED NOT DETECTED   Candida parapsilosis NOT DETECTED NOT DETECTED   Candida tropicalis NOT DETECTED NOT DETECTED   Cryptococcus neoformans/gattii NOT DETECTED NOT DETECTED   CTX-M ESBL NOT DETECTED NOT DETECTED   Carbapenem resistance IMP NOT DETECTED NOT DETECTED   Carbapenem resistance KPC NOT DETECTED NOT DETECTED   Carbapenem resistance NDM NOT DETECTED NOT DETECTED   Carbapenem resist OXA 48 LIKE NOT DETECTED NOT DETECTED   Carbapenem resistance VIM NOT DETECTED NOT DETECTED    Rico Junker 11/02/2021  7:57 PM

## 2021-11-03 DIAGNOSIS — K819 Cholecystitis, unspecified: Secondary | ICD-10-CM

## 2021-11-03 DIAGNOSIS — R6521 Severe sepsis with septic shock: Secondary | ICD-10-CM | POA: Diagnosis not present

## 2021-11-03 DIAGNOSIS — K810 Acute cholecystitis: Secondary | ICD-10-CM | POA: Diagnosis not present

## 2021-11-03 DIAGNOSIS — A4151 Sepsis due to Escherichia coli [E. coli]: Secondary | ICD-10-CM | POA: Diagnosis not present

## 2021-11-03 DIAGNOSIS — A419 Sepsis, unspecified organism: Secondary | ICD-10-CM | POA: Diagnosis not present

## 2021-11-03 LAB — COMPREHENSIVE METABOLIC PANEL
ALT: 51 U/L — ABNORMAL HIGH (ref 0–44)
AST: 84 U/L — ABNORMAL HIGH (ref 15–41)
Albumin: 2.1 g/dL — ABNORMAL LOW (ref 3.5–5.0)
Alkaline Phosphatase: 94 U/L (ref 38–126)
Anion gap: 11 (ref 5–15)
BUN: 58 mg/dL — ABNORMAL HIGH (ref 8–23)
CO2: 21 mmol/L — ABNORMAL LOW (ref 22–32)
Calcium: 7.4 mg/dL — ABNORMAL LOW (ref 8.9–10.3)
Chloride: 99 mmol/L (ref 98–111)
Creatinine, Ser: 2.05 mg/dL — ABNORMAL HIGH (ref 0.44–1.00)
GFR, Estimated: 26 mL/min — ABNORMAL LOW (ref >60)
Glucose, Bld: 136 mg/dL — ABNORMAL HIGH (ref 70–99)
Potassium: 3.9 mmol/L (ref 3.5–5.1)
Sodium: 131 mmol/L — ABNORMAL LOW (ref 135–145)
Total Bilirubin: 1.5 mg/dL — ABNORMAL HIGH (ref 0.3–1.2)
Total Protein: 5.3 g/dL — ABNORMAL LOW (ref 6.5–8.1)

## 2021-11-03 LAB — GLUCOSE, CAPILLARY
Glucose-Capillary: 117 mg/dL — ABNORMAL HIGH (ref 70–99)
Glucose-Capillary: 120 mg/dL — ABNORMAL HIGH (ref 70–99)
Glucose-Capillary: 124 mg/dL — ABNORMAL HIGH (ref 70–99)
Glucose-Capillary: 125 mg/dL — ABNORMAL HIGH (ref 70–99)
Glucose-Capillary: 127 mg/dL — ABNORMAL HIGH (ref 70–99)
Glucose-Capillary: 163 mg/dL — ABNORMAL HIGH (ref 70–99)
Glucose-Capillary: 193 mg/dL — ABNORMAL HIGH (ref 70–99)

## 2021-11-03 LAB — CBC
HCT: 30.2 % — ABNORMAL LOW (ref 36.0–46.0)
Hemoglobin: 10.6 g/dL — ABNORMAL LOW (ref 12.0–15.0)
MCH: 29.9 pg (ref 26.0–34.0)
MCHC: 35.1 g/dL (ref 30.0–36.0)
MCV: 85.3 fL (ref 80.0–100.0)
Platelets: 111 10*3/uL — ABNORMAL LOW (ref 150–400)
RBC: 3.54 MIL/uL — ABNORMAL LOW (ref 3.87–5.11)
RDW: 13.2 % (ref 11.5–15.5)
WBC: 18 10*3/uL — ABNORMAL HIGH (ref 4.0–10.5)
nRBC: 0 % (ref 0.0–0.2)

## 2021-11-03 LAB — PROCALCITONIN: Procalcitonin: 120.77 ng/mL

## 2021-11-03 LAB — TROPONIN I (HIGH SENSITIVITY): Troponin I (High Sensitivity): 362 ng/L (ref <18)

## 2021-11-03 LAB — MAGNESIUM
Magnesium: 1.2 mg/dL — ABNORMAL LOW (ref 1.7–2.4)
Magnesium: 1.8 mg/dL (ref 1.7–2.4)

## 2021-11-03 LAB — LACTIC ACID, PLASMA: Lactic Acid, Venous: 2.2 mmol/L (ref 0.5–1.9)

## 2021-11-03 LAB — PHOSPHORUS: Phosphorus: 2.9 mg/dL (ref 2.5–4.6)

## 2021-11-03 MED ORDER — LACTATED RINGERS IV BOLUS
500.0000 mL | Freq: Once | INTRAVENOUS | Status: AC
Start: 2021-11-03 — End: 2021-11-03
  Administered 2021-11-03: 500 mL via INTRAVENOUS

## 2021-11-03 MED ORDER — MIDODRINE HCL 5 MG PO TABS: 10.0000 mg | ORAL_TABLET | Freq: Three times a day (TID) | ORAL | Status: DC

## 2021-11-03 MED ORDER — MIDODRINE HCL 5 MG PO TABS
10.0000 mg | ORAL_TABLET | Freq: Three times a day (TID) | ORAL | Status: DC
  Administered 2021-11-03 – 2021-11-04 (×4): 10 mg via ORAL
  Filled 2021-11-03 (×4): qty 2

## 2021-11-03 MED ORDER — MAGNESIUM SULFATE 2 GM/50ML IV SOLN
2.0000 g | Freq: Once | INTRAVENOUS | Status: AC
  Administered 2021-11-03: 2 g via INTRAVENOUS
  Filled 2021-11-03: qty 50

## 2021-11-03 MED ORDER — MORPHINE SULFATE (PF) 2 MG/ML IV SOLN
2.0000 mg | INTRAVENOUS | Status: AC
  Administered 2021-11-03: 2 mg via INTRAVENOUS
  Filled 2021-11-03: qty 1

## 2021-11-03 NOTE — Progress Notes (Signed)
Dr. Belia Heman and Dr. Aleen Campi notified of elevated troponin level, no new orders at this time.

## 2021-11-03 NOTE — Progress Notes (Signed)
11/03/2021  Subjective: Patient had perc chole drain placed yesterday -- cultures prelim showing GNR.  Her blood cultures resulted positive for GNR and specifically E coli and Enterobacterales.  Her pressor requirements have improved this morning and she is currently on Ceftriaxone and Flagyl.  Her Cr is improving today, her WBC is elevated.  Patient reports the pain has improved a bit, but appears more comfortable this morning compare to yesterday.  Vital signs: Temp:  [97.9 F (36.6 C)-99.7 F (37.6 C)] 98.1 F (36.7 C) (01/14 0800) Pulse Rate:  [86-106] 86 (01/14 1015) Resp:  [19-32] 28 (01/14 1015) BP: (76-131)/(38-93) 105/70 (01/14 1015) SpO2:  [91 %-99 %] 98 % (01/14 1015) Weight:  [75.4 kg-79.5 kg] 79.5 kg (01/14 0500)   Intake/Output: 01/13 0701 - 01/14 0700 In: 5222 [I.V.:3986.1; IV Piggyback:1225.9] Out: 690 [Urine:650; Drains:40] Last BM Date:  (pta)  Physical Exam: Constitutional: No acute distress Abdomen:  soft, non-distended, with less diffuse pain, now more localized to the right abdomen compared to all around yesterday.  Still tender to palpation particularly in the RUQ.  Cholecystostomy drain in place with dark bile.    Labs:  Recent Labs    11/02/21 0943 11/03/21 0806  WBC 1.3*   1.3* 18.0*  HGB 11.7*   11.8* 10.6*  HCT 34.7*   34.7* 30.2*  PLT 129*   129* 111*   Recent Labs    11/02/21 0943 11/03/21 0806  NA 132* 131*  K 4.2 3.9  CL 94* 99  CO2 21* 21*  GLUCOSE 191* 136*  BUN 66* 58*  CREATININE 2.89* 2.05*  CALCIUM 8.6* 7.4*   Recent Labs    11/02/21 0943  LABPROT 15.5*  INR 1.2    Imaging: CT ABDOMEN PELVIS WO CONTRAST  Result Date: 11/02/2021 CLINICAL DATA:  Nausea, vomiting, diarrhea, acute kidney injury, vomiting and chills for 3 days EXAM: CT ABDOMEN AND PELVIS WITHOUT CONTRAST TECHNIQUE: Multidetector CT imaging of the abdomen and pelvis was performed following the standard protocol without IV contrast. COMPARISON:  None. FINDINGS:  Lower chest: Trace left pleural effusion. Coronary artery atherosclerosis. Hepatobiliary: No focal hepatic mass. Distended gallbladder with cholelithiasis. Gallbladder wall thickening. No intrahepatic or extrahepatic biliary ductal dilatation. Pancreas: Unremarkable. No pancreatic ductal dilatation or surrounding inflammatory changes. Spleen: Normal in size without focal abnormality. Adrenals/Urinary Tract: 2.4 cm left adrenal mass measuring 21 Hounsfield units of indeterminate etiology. 2.6 cm right adrenal mass of indeterminate etiology measuring 24 Hounsfield units. No urolithiasis. Mild left hydroureteronephrosis. Bilateral perinephric fat stranding. Normal bladder. Stomach/Bowel: Stomach is within normal limits. Appendix appears normal. No evidence of bowel wall thickening, distention, or inflammatory changes. Multiple fluid-filled loops of small bowel. Vascular/Lymphatic: Aortic atherosclerosis. No enlarged abdominal or pelvic lymph nodes. Reproductive: Uterus and bilateral adnexa are unremarkable. Other: No abdominal wall hernia or abnormality. No abdominopelvic ascites. Musculoskeletal: No acute osseous abnormality. No aggressive osseous lesion. Degenerative disease with disc height loss and facet arthropathy throughout the thoracolumbar spine. IMPRESSION: 1. Cholelithiasis with gallbladder distension and gallbladder wall thickening concerning for acute cholecystitis. 2. 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 Recommend adrenal washout CT or chemical shift MRI. JACR 2017 Aug; 14(8):1038-44, JCAT 2016 Mar-Apr; 40(2):194-200, Urol J 2006 Spring; 3(2):71-4. 3.  Aortic Atherosclerosis (ICD10-I70.0). Electronically Signed   By: Elige Ko M.D.   On: 11/02/2021 12:04   DG Chest 1 View  Result Date: 11/02/2021 CLINICAL DATA:  Right-sided central line placement EXAM: CHEST  1 VIEW COMPARISON:  Chest radiograph 11/02/2021  FINDINGS: There is a new right IJ central venous catheter terminating at the cavoatrial junction. The cardiomediastinal silhouette is stable. Lung aeration is unchanged, with no new or worsening focal airspace disease. There is no pleural effusion or pneumothorax There is no acute osseous abnormality. IMPRESSION: New right IJ central venous catheter terminating at the cavoatrial junction. No pneumothorax. Electronically Signed   By: Lesia Hausen M.D.   On: 11/02/2021 13:34   CT HEAD WO CONTRAST ( )  Result Date: 11/02/2021 CLINICAL DATA:  Vomiting and chills for 3 days.  Head trauma. EXAM: CT HEAD WITHOUT CONTRAST TECHNIQUE: Contiguous axial images were obtained from the base of the skull through the vertex without intravenous contrast. RADIATION DOSE REDUCTION: This exam was performed according to the departmental dose-optimization program which includes automated exposure control, adjustment of the mA and/or kV according to patient size and/or use of iterative reconstruction technique. COMPARISON:  None. FINDINGS: Brain: No evidence of acute infarction, hemorrhage, hydrocephalus, extra-axial collection or mass lesion/mass effect. Vascular: No hyperdense vessel or unexpected calcification. Skull: No osseous abnormality. Sinuses/Orbits: Opacification of the right maxillary sinus. Visualized mastoid sinuses are clear. Visualized orbits demonstrate no focal abnormality. Other: None IMPRESSION: No acute intracranial pathology. Electronically Signed   By: Elige Ko M.D.   On: 11/02/2021 12:07   IR Perc Cholecystostomy  Result Date: 11/02/2021 INDICATION: Acute cholecystitis with sepsis EXAM: 1. Ultrasound-guided puncture of the gallbladder via a subcostal transhepatic approach 2. Cholecystogram 3. Placement of a percutaneous cholecystostomy tube using fluoroscopic guidance MEDICATIONS: Patient received IV antibiotics per sepsis protocol prior to arrival to the IR suite ANESTHESIA/SEDATION: Moderate  (conscious) sedation was employed during this procedure. A total of Versed 1 mg and Fentanyl 50 mcg was administered intravenously by the radiology nurse. Total intra-service moderate Sedation Time: 14 minutes. The patient's level of consciousness and vital signs were monitored continuously by radiology nursing throughout the procedure under my direct supervision. FLUOROSCOPY TIME:  Fluoroscopy Time: 1 minutes 54 seconds (26 mGy). COMPLICATIONS: None immediate. PROCEDURE: Informed written consent was obtained from the patient after a thorough discussion of the procedural risks, benefits and alternatives. All questions were addressed. Maximal Sterile Barrier Technique was utilized including caps, mask, sterile gowns, sterile gloves, sterile drape, hand hygiene and skin antiseptic. A timeout was performed prior to the initiation of the procedure. The patient was placed supine on the exam table. The right upper quadrant was prepped and draped in the standard sterile fashion. Ultrasound was used to evaluate the gallbladder and select an appropriate skin entry site. Ultrasound demonstrated a gallbladder with thickened walls with internal sludge and gallstones. Skin entry site was marked and a subcostal location. Using ultrasound guidance, access was obtained into the gallbladder using a 21 gauge Chiba needle via a subcostal transhepatic approach. Location was confirmed with needle tip in the gallbladder lumen. An 018 wire was advanced into the gallbladder lumen and found to coil within on fluoroscopy. Subsequently, a transition dilator was advanced into the gallbladder lumen. Location within the gallbladder lumen was confirmed with injection of contrast material. Wire was exchanged for an 035 Amplatz wire, and the subcutaneous tract was serially dilated to eventually accommodate a 12 French locking drainage catheter. Approximately 25 mL of dark thick bile was aspirated with a sample sent to the lab for analysis. Location  of the drainage catheter was confirmed with injection of contrast material, which demonstrated a decompressed gallbladder with filling defects consistent with known gallstones. The drainage  catheter was secured to skin using silk suture and a dressing. It was attached to bag drainage. The patient tolerated the procedure well, and was transferred in stable condition. IMPRESSION: Successful placement of a 4 French percutaneous cholecystostomy tube using ultrasound and fluoroscopic guidance for the treatment of acute calculus cholecystitis. Drainage catheter to stated bag drainage. Further recommendations regarding surgical candidacy per general surgery. IR will plan for routine tube exchange in approximately 6 weeks if interval cholecystectomy not yet performed. Electronically Signed   By: Olive Bass M.D.   On: 11/02/2021 15:30    Assessment/Plan: This is a 66 y.o. female with sepsis due to cholecystitis.  --Patient has improved between yesterday and today with less pain and decreased pressor requirement.  Continue IV antibiotics and taylor based on culture data.  Flush drain as instructed. --May start clear liquid diet today.   I spent 25 minutes dedicated to the care of this patient on the date of this encounter to include pre-visit review of records, face-to-face time with the patient discussing diagnosis and management, and any post-visit coordination of care.  Howie Ill, MD  [facility]

## 2021-11-03 NOTE — Consult Note (Signed)
PHARMACY CONSULT NOTE  Pharmacy Consult for Electrolyte Monitoring and Replacement   Recent Labs: Potassium (mmol/L)  Date Value  11/03/2021 3.9   Magnesium (mg/dL)  Date Value  42/87/6811 1.2 (L)   Calcium (mg/dL)  Date Value  57/26/2035 7.4 (L)   Albumin (g/dL)  Date Value  59/74/1638 2.1 (L)  08/30/2021 4.3   Phosphorus (mg/dL)  Date Value  45/36/4680 2.9   Sodium (mmol/L)  Date Value  11/03/2021 131 (L)  08/30/2021 141   Assessment: Patient is a 66 y/o F with medical history including HTN, HLD, GERD, obesity, diabetes who is admitted with septic shock secondary to acute cholecystitis. Patient had percutaneous cholecystostomy tube placed emergently by IR 1/13. Patient disposition is the ICU. Pharmacy consulted to assist with electrolyte monitoring and replacement.  -NPO  Goal of Therapy:  Electrolytes within normal limits  Plan:  --Scr 2.89 > 2.05 (BL Scr ~ 1). Patient receiving fluids per sepsis protocol --Mag 1.2,  Will order Magnesium 2 gm x 1 (conservative d/t renal fxn),   --Recheck Mag level at 1800 --Follow-up electrolytes with AM labs tomorrow  Kandi Brusseau A 11/03/2021 10:17 AM

## 2021-11-03 NOTE — Progress Notes (Signed)
PHARMACY CONSULT NOTE  Pharmacy Consult for Electrolyte Monitoring and Replacement   Recent Labs: Potassium (mmol/L)  Date Value  11/03/2021 3.9   Magnesium (mg/dL)  Date Value  27/78/2423 1.8   Calcium (mg/dL)  Date Value  53/61/4431 7.4 (L)   Albumin (g/dL)  Date Value  54/00/8676 2.1 (L)  08/30/2021 4.3   Phosphorus (mg/dL)  Date Value  19/50/9326 2.9   Sodium (mmol/L)  Date Value  11/03/2021 131 (L)  08/30/2021 141   Assessment: Patient is a 66 y/o F with medical history including HTN, HLD, GERD, obesity, diabetes who is admitted with septic shock secondary to acute cholecystitis. Patient had percutaneous cholecystostomy tube placed emergently by IR 1/13. Patient disposition is the ICU. Pharmacy consulted to assist with electrolyte monitoring and replacement.  -NPO  Goal of Therapy:  Electrolytes within normal limits  Plan:  --Mag 1.2 > 1.8 after MgSulfate 2gm IVPB x 1 dose given this AM --NO additional replacement today --Follow-up electrolytes with AM labs tomorrow  Kaho Selle Rodriguez-Guzman PharmD, BCPS 11/03/2021 7:36 PM

## 2021-11-03 NOTE — Plan of Care (Signed)
Continuing with plan of care. 

## 2021-11-03 NOTE — Progress Notes (Signed)
NAME:  Amanda [name], MRN:  [id number], DOB:  29-Jan-1956, LOS: 1 ADMISSION DATE:  11/02/2021  BRIEF SYNOPSIS 66 y.o. female admitted with Septic Shock in the setting of Acute Cholecystitis due to Cholelithiasis, s/p percutaneous cholecystostomy tube placement 1/13 by IR.  General Surgery following.   Significant Hospital Events: Including procedures, antibiotic start and stop dates in addition to other pertinent events   1/13 admitted for septic shock 1/14 remains on pressors      Micro Data:  1/13 Enterobacter and E coli Bacteremia Blood Cultures   Antimicrobials:   Antibiotics Given (last 72 hours)     Date/Time Action Medication Dose Rate   11/02/21 1049 New Bag/Given   ceFEPIme (MAXIPIME) 2 g in sodium chloride 0.9 % 100 mL IVPB 2 g 200 mL/hr   11/02/21 1206 New Bag/Given   metroNIDAZOLE (FLAGYL) IVPB 500 mg 500 mg 100 mL/hr   11/02/21 2105 New Bag/Given   metroNIDAZOLE (FLAGYL) IVPB 500 mg 500 mg 100 mL/hr   11/03/21 0850 New Bag/Given   metroNIDAZOLE (FLAGYL) IVPB 500 mg 500 mg 100 mL/hr            Interim History / Subjective:  Remains on pressors  +septic shock +renal failure  BMP Latest Ref Rng & Units 11/03/2021 11/02/2021 08/30/2021  Glucose 70 - 99 mg/dL 621(H) 086(V) 784(O)  BUN 8 - 23 mg/dL 96(E) 95(M) 23  Creatinine 0.44 - 1.00 mg/dL 8.41(L) 2.44(W) 1.02(V)  BUN/Creat Ratio 12 - 28 - - 21  Sodium 135 - 145 mmol/L 131(L) 132(L) 141  Potassium 3.5 - 5.1 mmol/L 3.9 4.2 4.4  Chloride 98 - 111 mmol/L 99 94(L) 101  CO2 22 - 32 mmol/L 21(L) 21(L) 27  Calcium 8.9 - 10.3 mg/dL 7.4(L) 8.6(L) 10.0         Objective   Blood pressure (!) 128/91, pulse (!) 104, temperature 98.5 F (36.9 C), temperature source Axillary, resp. rate (!) 23, height 5' (1.524 m), weight 79.5 kg, SpO2 96 %. CVP:  [2 mmHg-15 mmHg] 15 mmHg      Intake/Output Summary (Last 24 hours) at 11/03/2021 0910 Last data filed at 11/03/2021 0649 Gross per 24 hour  Intake 5222.02 ml   Output 690 ml  Net 4532.02 ml   Filed Weights   11/02/21 0904 11/02/21 1637 11/03/21 0500  Weight: 73.1 kg 75.4 kg 79.5 kg    Review of Systems:  Gen:  feels weak Cardiac:  No dizziness, chest pain or heaviness, chest tightness,edema, No JVD Resp:   No cough, -sputum production, -shortness of breath,-wheezing, -hemoptysis,  Gi: +abd pain Other:  All other systems negative  Physical Examination:   General Appearance: No distress  EYES PERRLA, EOM intact.   NECK Supple, No JVD Pulmonary: normal breath sounds, No wheezing.  CardiovascularNormal S1,S2.  No m/r/g.   Abdomen: distended and ABD pain Skin:   warm, no rashes, no ecchymosis  Extremities: normal, no cyanosis, clubbing. Neuro:without focal findings,  speech normal  PSYCHIATRIC: Mood, affect within normal limits.   ALL OTHER ROS ARE NEGATIVE     Labs/imaging that I havepersonally reviewed  (right click and "Reselect all SmartList Selections" daily)       ASSESSMENT AND PLAN SYNOPSIS  Admitted for severe spetic shock due to infected GB s/p IR  Successful placement of 12Fr percutaneous cholecystostomy tube     CARDIAC ICU monitoring   ACUTE KIDNEY INJURY/Renal Failure -continue Foley Catheter-assess need -Avoid nephrotoxic agents -Follow urine output, BMP -Ensure adequate renal perfusion, optimize  oxygenation -Renal dose medications   Intake/Output Summary (Last 24 hours) at 11/03/2021 0910 Last data filed at 11/03/2021 8242 Gross per 24 hour  Intake 5222.02 ml  Output 690 ml  Net 4532.02 ml      SEPTIC SHOCK SOURCE-infected GB -use vasopressors to keep MAP>65 as needed -follow ABG and LA -follow up cultures -consider stress dose steroids -aggressive IV fluid resuscitation  INFECTIOUS DISEASE -continue antibiotics as prescribed -follow up cultures  ENDO - ICU hypoglycemic\Hyperglycemia protocol -check FSBS per protocol   GI GI PROPHYLAXIS as indicated Follow up General Surgery  recs  NUTRITIONAL STATUS DIET-->NPO Constipation protocol as indicated   ELECTROLYTES -follow labs as needed -replace as needed -pharmacy consultation and following    Best Practice (right click and "Reselect all SmartList Selections" daily)    Diet/type: NPO DVT prophylaxis: prophylactic heparin  GI prophylaxis: PPI Lines: Central line and yes and it is still needed Foley:  N/A Code Status:  full code   Labs   CBC: Recent Labs  Lab 11/02/21 0943 11/03/21 0806  WBC 1.3*   1.3* 18.0*  NEUTROABS 0.9*  --   HGB 11.7*   11.8* 10.6*  HCT 34.7*   34.7* 30.2*  MCV 89.0   89.4 85.3  PLT 129*   129* 111*    Basic Metabolic Panel: Recent Labs  Lab 11/02/21 0943  NA 132*  K 4.2  CL 94*  CO2 21*  GLUCOSE 191*  BUN 66*  CREATININE 2.89*  CALCIUM 8.6*   GFR: Estimated Creatinine Clearance: 17.9 mL/min (A) (by C-G formula based on SCr of 2.89 mg/dL (H)). Recent Labs  Lab 11/02/21 0943 11/02/21 1121 11/02/21 1656 11/03/21 0011 11/03/21 0806  PROCALCITON 143.43  --   --   --   --   WBC 1.3*   1.3*  --   --   --  18.0*  LATICACIDVEN 5.9* 4.2* 2.8* 2.2*  --     Liver Function Tests: Recent Labs  Lab 11/02/21 0943  AST 45*  ALT 21  ALKPHOS 186*  BILITOT 1.7*  PROT 7.4  ALBUMIN 3.3*   Recent Labs  Lab 11/02/21 0943  LIPASE 21   No results for input(s): AMMONIA in the last 168 hours.  ABG No results found for: PHART, PCO2ART, PO2ART, HCO3, TCO2, ACIDBASEDEF, O2SAT   Coagulation Profile: Recent Labs  Lab 11/02/21 0943  INR 1.2    Cardiac Enzymes: No results for input(s): CKTOTAL, CKMB, CKMBINDEX, TROPONINI in the last 168 hours.  HbA1C: Hemoglobin A1C  Date/Time Value Ref Range Status  06/12/2016 12:00 AM 7.8  Final   HB A1C (BAYER DCA - WAIVED)  Date/Time Value Ref Range Status  08/30/2021 08:18 AM 6.7 (H) 4.8 - 5.6 % Final    Comment:             Prediabetes: 5.7 - 6.4          Diabetes: >6.4          Glycemic control for adults  with diabetes: <7.0   04/02/2021 03:24 PM 6.9 <7.0 % Final    Comment:                                          Diabetic Adult            <7.0  Healthy Adult        4.3 - 5.7                                                           (DCCT/NGSP) American Diabetes Association's Summary of Glycemic Recommendations for Adults with Diabetes: Hemoglobin A1c <7.0%. More stringent glycemic goals (A1c <6.0%) may further reduce complications at the cost of increased risk of hypoglycemia.     CBG: Recent Labs  Lab 11/02/21 1640 11/03/21 0009 11/03/21 0406 11/03/21 0819  GLUCAP 240* 193* 163* 127*    Allergies Allergies  Allergen Reactions   Penicillins    Tramadol Other (See Comments)    "woozy"       DVT/GI PRX  assessed I Assessed the need for Labs I Assessed the need for Foley I Assessed the need for Central Venous Line Family Discussion when available I Assessed the need for Mobilization I made an Assessment of medications to be adjusted accordingly Safety Risk assessment completed  CASE DISCUSSED IN MULTIDISCIPLINARY ROUNDS WITH ICU TEAM     Critical Care Time devoted to patient care services described in this note is 40 minutes.    Lucie Leather, M.D.  [facility]  Medical Director [facility] Medical Director [facility] Cardio-Pulmonary Department

## 2021-11-04 DIAGNOSIS — K810 Acute cholecystitis: Secondary | ICD-10-CM | POA: Diagnosis not present

## 2021-11-04 DIAGNOSIS — A4151 Sepsis due to Escherichia coli [E. coli]: Secondary | ICD-10-CM | POA: Diagnosis not present

## 2021-11-04 DIAGNOSIS — A419 Sepsis, unspecified organism: Secondary | ICD-10-CM | POA: Diagnosis not present

## 2021-11-04 DIAGNOSIS — R6521 Severe sepsis with septic shock: Secondary | ICD-10-CM | POA: Diagnosis not present

## 2021-11-04 LAB — GLUCOSE, CAPILLARY
Glucose-Capillary: 232 mg/dL — ABNORMAL HIGH (ref 70–99)
Glucose-Capillary: 88 mg/dL (ref 70–99)
Glucose-Capillary: 91 mg/dL (ref 70–99)
Glucose-Capillary: 105 mg/dL — ABNORMAL HIGH (ref 70–99)
Glucose-Capillary: 164 mg/dL — ABNORMAL HIGH (ref 70–99)
Glucose-Capillary: 139 mg/dL — ABNORMAL HIGH (ref 70–99)
Glucose-Capillary: 126 mg/dL — ABNORMAL HIGH (ref 70–99)

## 2021-11-04 LAB — COMPREHENSIVE METABOLIC PANEL
ALT: 41 U/L (ref 0–44)
ALT: 38 U/L (ref 0–44)
AST: 62 U/L — ABNORMAL HIGH (ref 15–41)
AST: 52 U/L — ABNORMAL HIGH (ref 15–41)
Albumin: 1.9 g/dL — ABNORMAL LOW (ref 3.5–5.0)
Albumin: 1.9 g/dL — ABNORMAL LOW (ref 3.5–5.0)
Alkaline Phosphatase: 79 U/L (ref 38–126)
Alkaline Phosphatase: 86 U/L (ref 38–126)
Anion gap: 11 (ref 5–15)
Anion gap: 9 (ref 5–15)
BUN: 59 mg/dL — ABNORMAL HIGH (ref 8–23)
BUN: 59 mg/dL — ABNORMAL HIGH (ref 8–23)
CO2: 20 mmol/L — ABNORMAL LOW (ref 22–32)
CO2: 21 mmol/L — ABNORMAL LOW (ref 22–32)
Calcium: 7.5 mg/dL — ABNORMAL LOW (ref 8.9–10.3)
Calcium: 7.5 mg/dL — ABNORMAL LOW (ref 8.9–10.3)
Chloride: 103 mmol/L (ref 98–111)
Chloride: 105 mmol/L (ref 98–111)
Creatinine, Ser: 1.79 mg/dL — ABNORMAL HIGH (ref 0.44–1.00)
Creatinine, Ser: 1.67 mg/dL — ABNORMAL HIGH (ref 0.44–1.00)
GFR, Estimated: 31 mL/min — ABNORMAL LOW (ref >60)
GFR, Estimated: 34 mL/min — ABNORMAL LOW (ref >60)
Glucose, Bld: 97 mg/dL (ref 70–99)
Glucose, Bld: 153 mg/dL — ABNORMAL HIGH (ref 70–99)
Potassium: 3.3 mmol/L — ABNORMAL LOW (ref 3.5–5.1)
Potassium: 3.1 mmol/L — ABNORMAL LOW (ref 3.5–5.1)
Sodium: 134 mmol/L — ABNORMAL LOW (ref 135–145)
Sodium: 135 mmol/L (ref 135–145)
Total Bilirubin: 0.6 mg/dL (ref 0.3–1.2)
Total Bilirubin: 1.4 mg/dL — ABNORMAL HIGH (ref 0.3–1.2)
Total Protein: 4.7 g/dL — ABNORMAL LOW (ref 6.5–8.1)
Total Protein: 5.1 g/dL — ABNORMAL LOW (ref 6.5–8.1)

## 2021-11-04 LAB — CBC
HCT: 27.1 % — ABNORMAL LOW (ref 36.0–46.0)
Hemoglobin: 9.4 g/dL — ABNORMAL LOW (ref 12.0–15.0)
MCH: 29.9 pg (ref 26.0–34.0)
MCHC: 34.7 g/dL (ref 30.0–36.0)
MCV: 86.3 fL (ref 80.0–100.0)
Platelets: 62 10*3/uL — ABNORMAL LOW (ref 150–400)
RBC: 3.14 MIL/uL — ABNORMAL LOW (ref 3.87–5.11)
RDW: 13.2 % (ref 11.5–15.5)
WBC: 8.4 10*3/uL (ref 4.0–10.5)
nRBC: 0 % (ref 0.0–0.2)

## 2021-11-04 LAB — PROCALCITONIN: Procalcitonin: 72.87 ng/mL

## 2021-11-04 LAB — PHOSPHORUS
Phosphorus: 2.4 mg/dL — ABNORMAL LOW (ref 2.5–4.6)
Phosphorus: 3 mg/dL (ref 2.5–4.6)

## 2021-11-04 LAB — MAGNESIUM
Magnesium: 1.9 mg/dL (ref 1.7–2.4)
Magnesium: 1.9 mg/dL (ref 1.7–2.4)

## 2021-11-04 MED ORDER — SALINE SPRAY 0.65 % NA SOLN
1.0000 | NASAL | Status: DC | PRN
  Administered 2021-11-04: 1 via NASAL
  Filled 2021-11-04 (×2): qty 44

## 2021-11-04 MED ORDER — POTASSIUM CHLORIDE CRYS ER 20 MEQ PO TBCR
40.0000 meq | EXTENDED_RELEASE_TABLET | Freq: Once | ORAL | Status: AC
  Administered 2021-11-04: 40 meq via ORAL
  Filled 2021-11-04: qty 2

## 2021-11-04 MED ORDER — ALBUMIN HUMAN 25 % IV SOLN
25.0000 g | Freq: Four times a day (QID) | INTRAVENOUS | Status: DC
  Administered 2021-11-04 – 2021-11-05 (×3): 25 g via INTRAVENOUS
  Filled 2021-11-04 (×3): qty 100

## 2021-11-04 MED ORDER — MAGNESIUM SULFATE 2 GM/50ML IV SOLN
2.0000 g | Freq: Once | INTRAVENOUS | Status: AC
  Administered 2021-11-04: 2 g via INTRAVENOUS
  Filled 2021-11-04: qty 50

## 2021-11-04 MED ORDER — POTASSIUM PHOSPHATES 15 MMOLE/5ML IV SOLN
10.0000 mmol | Freq: Once | INTRAVENOUS | Status: AC
  Administered 2021-11-04: 10 mmol via INTRAVENOUS
  Filled 2021-11-04: qty 3.33

## 2021-11-04 MED ORDER — POTASSIUM CHLORIDE 10 MEQ/50ML IV SOLN
10.0000 meq | INTRAVENOUS | Status: AC
  Administered 2021-11-04 – 2021-11-05 (×4): 10 meq via INTRAVENOUS
  Filled 2021-11-04 (×4): qty 50

## 2021-11-04 NOTE — Progress Notes (Signed)
Dr. Tim Lair notified of decreased urine output for last part of the shift, no new orders at this time.

## 2021-11-04 NOTE — Progress Notes (Signed)
NAME:  Amanda [name], MRN:  [id number], DOB:  09-15-1956, LOS: 2 ADMISSION DATE:  11/02/2021, CONSULTATION DATE:  11/02/2021 REFERRING MD:  Dr. Tamala Julian, CHIEF COMPLAINT:  Abdominal Pain, N/V   Brief Pt Description / Synopsis:  Patient is a 66 y/o F with medical history including HTN, HLD, GERD, obesity, diabetes who is admitted with septic shock secondary to acute cholecystitis. Patient had percutaneous cholecystostomy tube placed emergently by IR 1/13 and transferred to the ICU.   ED Course: Initial vital signs: Temperature 101 Fahrenheit orally, respiratory rate 25, pulse 115, blood pressure 101/67, 97% SPO2 on room air. Significant labs: Sodium 132, chloride 94, bicarbonate 21, glucose 191, BUN 66, creatinine 2.89, anion gap 17, alkaline phosphatase 186, lipase 21, AST 45, ALT 21, total bilirubin 1.7, lactic acid 5.9, procalcitonin 143, WBC 1.3 with mild left shift, hemoglobin 11.7, hematocrit 34.7, platelets 129 with normal morphology, PT 15.5, INR 1.2 CT abdomen and pelvis: Cholelithiasis with gallbladder distension and gallbladder wall thickening concerning for acute cholecystitis.  PCCM is asked to admit the patient to ICU for further work-up and treatment of  Septic Shock in the setting of Acute Cholecystitis due to Cholelithiasis, s/p percutaneous cholecystostomy tube placement 1/13 by IR.   Pertinent  Medical History  Hypertension Hyperlipidemia GERD Diabetes mellitus Obesity  Micro Data:  11/02/2021: SARS-CoV-2 and influenza PCR>> negative 11/02/2021: Blood culture x2>> Growing Enterobacterales, E.Coli 11/02/2021: Urine>> 11/02/2021: Fluid from cholecystostomy tube>>  Antimicrobials:  Cefepime 1/13 x 1 dose Flagyl 1/13>> Ceftriaxone 1/13>> Eraxis 1/13>>  Significant Hospital Events: Including procedures, antibiotic start and stop dates in addition to other pertinent events   11/02/2021: Presented to ED, found to have acute cholecystitis.  General surgery evaluated,  IR  performed cholecystostomy tube.  PCCM asked to admit due to vasopressor requirements 11/03/2021: Tolerating clear liquids, off PRESSORS  Interim History / Subjective:  -Off Levophed -Tolerating clear liquid -Minimal output from cholecystostomy bag -Platelets low this am, looks like hemodilutional will continue to monitor  Objective   Blood pressure (!) 94/56, pulse 73, temperature 98.3 F (36.8 C), temperature source Oral, resp. rate 15, height 5' (1.524 m), weight 79.7 kg, SpO2 99 %. CVP:  [1 mmHg-24 mmHg] 12 mmHg      Intake/Output Summary (Last 24 hours) at 11/04/2021 0842 Last data filed at 11/04/2021 [id number] Gross per 24 hour  Intake 1238.7 ml  Output 975 ml  Net 263.7 ml   Filed Weights   11/02/21 1637 11/03/21 0500 11/04/21 0500  Weight: 75.4 kg 79.5 kg 79.7 kg   Physical Examination: General: Critically ill-appearing female, laying in bed, on room air, no acute distress HENT: Atraumatic, normocephalic, neck supple, no JVD Lungs: Clear breath sounds throughout, no wheezing or rales noted, even, nonlabored, no accessory muscle use Cardiovascular: Tachycardia, regular rhythm, S1-S2, no murmurs, rubs, gallops Abdomen: Obese, soft, extremely tender, slightly distended, no guarding or rebound tenderness Extremities: Normal bulk and tone, no deformities, no edema Neuro: Difficult to perform neuro exam due to administration recently of fentanyl and Versed for procedure.  Will arouse to voice and nod to questions. GU: Deferred  Resolved Hospital Problem list     Assessment & Plan:  Severe Sepsis  with Septic Shock in the setting of Acute Cholecystis Blood culture x2>> Growing Enterobacterales, E.Coli -Continuous cardiac monitoring -Continue IV fluids and Vasopressors as needed to maintain MAP goal. Off Levo -Stress dose steroids -Trend lactic acid until normalized  -Trend HS Troponin until peaked -Monitor fever curve -Trend WBC's & Procalcitonin -Follow  cultures as  above -Continue empiric Ceftriaxone  Acute Cholecystitis due to Cholelithiasis,  s/p percutaneous cholecystostomy tube placement 1/13 -Continue Clear liquid as tolerated per General surgery -IV fluids -ABX as above -IV Protonix daily -Pain control prn; antiemetics prn -General Surgery following, appreciate input -Care/Management of Cholecystostomy tube as per IR & General surgery  Pancytopenia, suspect Leukocytopenia & Thrombocytopenia due to Sepsis Normochromic Normocytic Anemia without overt s/sx of bleeding -Monitor for S/Sx of bleeding -Trend CBC -Heparin SQ for VTE Prophylaxis  -Transfuse for Hgb <7 -Smear review with unremarkable RBC morphology, normal platelet morphology, WBC's with mild left shift  Acute Kidney Injury superimposed on CKD Anion Gap Metabolic Acidosis in setting of Lactic Acidosis Mild Hyponatremia, suspect from dehydration and poor PO intake~improving -Monitor I&O's / urinary output -Follow BMP -Ensure adequate renal perfusion -Avoid nephrotoxic agents as able -Replace electrolytes as indicated -IV fluids -Trend lactic acid  Diabetes Mellitus -CBG's q4h; Target range of 140 to 180 -SSI -Follow ICU Hypo/Hyperglycemia protocol -Check Hgb A1c  Best Practice (right click and "Reselect all SmartList Selections" daily)   Diet/type: NPO DVT prophylaxis: prophylactic heparin  GI prophylaxis: PPI Lines: Central line and yes and it is still needed Foley:  N/A Code Status:  full code Last date of multidisciplinary goals of care discussion [N/A]  Labs   CBC: Recent Labs  Lab 11/02/21 0943 11/03/21 0806 11/04/21 0614  WBC 1.3*   1.3* 18.0* 8.4  NEUTROABS 0.9*  --   --   HGB 11.7*   11.8* 10.6* 9.4*  HCT 34.7*   34.7* 30.2* 27.1*  MCV 89.0   89.4 85.3 86.3  PLT 129*   129* 111* 62*     Basic Metabolic Panel: Recent Labs  Lab 11/02/21 0943 11/03/21 0806 11/03/21 1844 11/04/21 0614  NA 132* 131*  --  134*  K 4.2 3.9  --  3.3*  CL 94* 99   --  103  CO2 21* 21*  --  20*  GLUCOSE 191* 136*  --  97  BUN 66* 58*  --  59*  CREATININE 2.89* 2.05*  --  1.79*  CALCIUM 8.6* 7.4*  --  7.5*  MG  --  1.2* 1.8 1.9  PHOS  --  2.9  --  2.4*    GFR: Estimated Creatinine Clearance: 28.9 mL/min (A) (by C-G formula based on SCr of 1.79 mg/dL (H)). Recent Labs  Lab 11/02/21 0943 11/02/21 1121 11/02/21 1656 11/03/21 0011 11/03/21 0806 11/04/21 0614  PROCALCITON 143.43  --   --   --  120.77 72.87  WBC 1.3*   1.3*  --   --   --  18.0* 8.4  LATICACIDVEN 5.9* 4.2* 2.8* 2.2*  --   --      Liver Function Tests: Recent Labs  Lab 11/02/21 0943 11/03/21 0806 11/04/21 0614  AST 45* 84* 62*  ALT 21 51* 41  ALKPHOS 186* 94 79  BILITOT 1.7* 1.5* 0.6  PROT 7.4 5.3* 4.7*  ALBUMIN 3.3* 2.1* 1.9*    Recent Labs  Lab 11/02/21 0943  LIPASE 21    No results for input(s): AMMONIA in the last 168 hours.  ABG No results found for: PHART, PCO2ART, PO2ART, HCO3, TCO2, ACIDBASEDEF, O2SAT   Coagulation Profile: Recent Labs  Lab 11/02/21 0943  INR 1.2     Cardiac Enzymes: No results for input(s): CKTOTAL, CKMB, CKMBINDEX, TROPONINI in the last 168 hours.  HbA1C: Hemoglobin A1C  Date/Time Value Ref Range Status  06/12/2016 12:00 AM 7.8  Final   HB A1C (BAYER DCA - WAIVED)  Date/Time Value Ref Range Status  08/30/2021 08:18 AM 6.7 (H) 4.8 - 5.6 % Final    Comment:             Prediabetes: 5.7 - 6.4          Diabetes: >6.4          Glycemic control for adults with diabetes: <7.0   04/02/2021 03:24 PM 6.9 <7.0 % Final    Comment:                                          Diabetic Adult            <7.0                                       Healthy Adult        4.3 - 5.7                                                           (DCCT/NGSP) American Diabetes Association's Summary of Glycemic Recommendations for Adults with Diabetes: Hemoglobin A1c <7.0%. More stringent glycemic goals (A1c <6.0%) may further reduce  complications at the cost of increased risk of hypoglycemia.     CBG: Recent Labs  Lab 11/03/21 1659 11/03/21 2008 11/03/21 2350 11/04/21 0411 11/04/21 0739  GLUCAP 117* 125* 124* 88 91    Past Medical History:  She,  has a past medical history of Diabetes mellitus without complication ([place]), Hyperlipidemia, and Hypertension.   Surgical History:   Past Surgical History:  Procedure Laterality Date   COLONOSCOPY  2012   IR PERC CHOLECYSTOSTOMY  11/02/2021     Social History:   reports that she quit smoking about 45 years ago. Her smoking use included cigarettes. She has never used smokeless tobacco. She reports that she does not drink alcohol and does not use drugs.   Family History:  Her family history includes Cancer in her niece; Diabetes in her mother and sister; Heart disease in her sister and sister; Hypertension in her mother.   Allergies Allergies  Allergen Reactions   Penicillins    Tramadol Other (See Comments)    "woozy"     Home Medications  Prior to Admission medications   Medication Sig Start Date End Date Taking? Authorizing Provider  benazepril (LOTENSIN) 40 MG tablet Take 1 tablet by mouth once daily 09/26/21   Cannady, [name], NP  carvedilol (COREG) 25 MG tablet TAKE 1 TABLET BY MOUTH TWICE DAILY WITH MEALS 09/26/21   Cannady, Henrine Screws T, NP  aspirin 81 MG EC tablet Take 81 mg by mouth daily. Swallow whole.    [provider]  Calcium Carb-Cholecalciferol (CALCIUM 600+D3) 600-200 MG-UNIT TABS Take 1 tablet by mouth daily.    [provider]  hydrochlorothiazide (HYDRODIURIL) 25 MG tablet Take 1 tablet by mouth once daily 10/08/21   Vigg, [name], MD  lovastatin (MEVACOR) 20 MG tablet TAKE 2 TABLETS BY MOUTH AT BEDTIME 10/16/21   Cannady, [name], NP  meloxicam (MOBIC) 15 MG tablet Take 1  tablet (15 mg total) by mouth daily. Only to use prn 05/03/21   Vigg, [name], MD  Multiple Vitamin (MULTIVITAMIN) tablet Take 1 tablet by mouth daily.     [provider]  omeprazole (PRILOSEC) 20 MG capsule Take 20 mg by mouth daily.    [provider]  SYNJARDY XR 12.02-999 MG TB24 Take 1 tablet by mouth twice daily 09/20/21   Vigg, [name], MD  traZODone (DESYREL) 50 MG tablet TAKE 1 TABLET BY MOUTH AT BEDTIME 09/20/21   Vigg, [name], MD  TRULICITY 3 0000000 SOPN INJECT 1 SYRINGE SUBCUTANEOUSLY ONCE A WEEK 10/16/21   Vigg, [name], MD  Scheduled Meds:  Chlorhexidine Gluconate Cloth  6 each Topical Daily   heparin  5,000 Units Subcutaneous Q8H   hydrocortisone sod succinate (SOLU-CORTEF) inj  100 mg Intravenous Q8H   insulin aspart  0-20 Units Subcutaneous Q4H   mouth rinse  15 mL Mouth Rinse BID   midodrine  10 mg Oral TID WC   pantoprazole (PROTONIX) IV  40 mg Intravenous Q24H   sodium chloride flush  5 mL Intracatheter Q8H   Continuous Infusions:  anidulafungin Stopped (11/03/21 1717)   cefTRIAXone (ROCEPHIN)  IV Stopped (11/03/21 1038)   metronidazole Stopped (11/03/21 2236)   norepinephrine (LEVOPHED) Adult infusion Stopped (11/03/21 2305)   potassium PHOSPHATE IVPB (in mmol)     vasopressin Stopped (11/03/21 1850)   PRN Meds:.acetaminophen, docusate sodium, ondansetron (ZOFRAN) IV, polyethylene glycol   Critical care time: 35 minutes       Rufina Falco, DNP, CCRN, FNP-C, AGACNP-BC Acute Care Nurse Practitioner  [facility] Pager: [phone] [place] [facility]

## 2021-11-04 NOTE — Plan of Care (Signed)
Continuing with plan of care. 

## 2021-11-04 NOTE — Care Management (Signed)
ICU pickup for 1/16  21M admitted for septic shock due to infected bile duct, e coli bacteremia, now eating, off pressors, no oxygen required, alert and awake, following commands, plan to continue ABX, has drain placed by IR into GB  TRH to assume primary care of this patient on 1/16 Signout via secure chat from PCCM attending  Ralene Muskrat MD

## 2021-11-04 NOTE — Consult Note (Signed)
PHARMACY CONSULT NOTE  Pharmacy Consult for Electrolyte Monitoring and Replacement   Recent Labs: Potassium (mmol/L)  Date Value  11/04/2021 3.3 (L)   Magnesium (mg/dL)  Date Value  70/10/7492 1.9   Calcium (mg/dL)  Date Value  49/67/5916 7.5 (L)   Albumin (g/dL)  Date Value  38/46/6599 1.9 (L)  08/30/2021 4.3   Phosphorus (mg/dL)  Date Value  35/70/1779 2.4 (L)   Sodium (mmol/L)  Date Value  11/04/2021 134 (L)  08/30/2021 141   Assessment: Patient is a 66 y/o F with medical history including HTN, HLD, GERD, obesity, diabetes who is admitted with septic shock secondary to acute cholecystitis. Patient had percutaneous cholecystostomy tube placed emergently by IR 1/13. Patient disposition is the ICU. Pharmacy consulted to assist with electrolyte monitoring and replacement.  -NPO  Goal of Therapy:  Electrolytes within normal limits  Plan:  --Scr 2.89 > 2.05 > 1.79 ( Baseline Scr ~ 1). Patient receiving fluids per sepsis protocol --K 3.3,  Phos 2.4   Will order Potassium Phosphate 10 mmol IV x 1, (conservative d/t renal fxn), --Follow-up electrolytes with AM labs tomorrow  Arly Salminen A 11/04/2021 8:07 AM

## 2021-11-04 NOTE — Progress Notes (Signed)
11/04/2021  Subjective: No acute events overnight.  This morning, the patient is off vasopressor support and reports that she is feeling better with less diffuse pain.  She tolerated clear liquid diet without any nausea or vomiting.  His white blood cell count today has normalized to 8.4 and his creatinine continues to improve and it is 1.79 this morning  Vital signs: Temp:  [98 F (36.7 C)-98.7 F (37.1 C)] 98.3 F (36.8 C) (01/15 0000) Pulse Rate:  [73-116] 81 (01/15 0900) Resp:  [14-32] 24 (01/15 0900) BP: (78-126)/(38-100) 111/63 (01/15 0900) SpO2:  [79 %-99 %] 96 % (01/15 0900) Weight:  [79.7 kg] 79.7 kg (01/15 0500)   Intake/Output: 01/14 0701 - 01/15 0700 In: 1238.7 [I.V.:244.8; IV Piggyback:993.9] Out: 975 [Urine:950; Drains:25] Last BM Date:  (pta)  Physical Exam: Constitutional: No acute distress Abdomen: Soft, nondistended, with more localized tenderness to palpation in the right upper quadrant.  No peritonitis.  Right upper quadrant percutaneous cholecystostomy drain in place with low volume bile in the bag.  Labs:  Recent Labs    11/03/21 0806 11/04/21 0614  WBC 18.0* 8.4  HGB 10.6* 9.4*  HCT 30.2* 27.1*  PLT 111* 62*   Recent Labs    11/03/21 0806 11/04/21 0614  NA 131* 134*  K 3.9 3.3*  CL 99 103  CO2 21* 20*  GLUCOSE 136* 97  BUN 58* 59*  CREATININE 2.05* 1.79*  CALCIUM 7.4* 7.5*   Recent Labs    11/02/21 0943  LABPROT 15.5*  INR 1.2    Imaging: No results found.  Assessment/Plan: This is a 66 y.o. female with acute cholecystitis presenting with septic shock now much improved.  - Patient has been improving clinically and her white blood cell count is normalized, her creatinine is improving, and she is off vasopressors.  Her abdominal exam is also improved. - We will advance the patient's diet to full liquid diet today.  If she is doing very well with this today, she may be advanced to a diabetic cardiac diet for dinner. - Continue IV  antibiotics.  Bile cultures are still pending but are showing few negative rods.  Blood culture showing E. coli with susceptibilities pending.   I spent 25 minutes dedicated to the care of this patient on the date of this encounter to include pre-visit review of records, face-to-face time with the patient discussing diagnosis and management, and any post-visit coordination of care.  Howie Ill, MD [facility]

## 2021-11-04 NOTE — Progress Notes (Addendum)
Patient had a 17 run of SVT with HR greater than 150 while sleeping; patient was asymptomatic and returned to NSR.  Dr. Belia Heman notified and ordered for a mag, phos, and CMP level to be drawn and patient to stay on unit and not be transferred to med-surg floor at this time.

## 2021-11-05 DIAGNOSIS — R6521 Severe sepsis with septic shock: Secondary | ICD-10-CM | POA: Diagnosis not present

## 2021-11-05 LAB — COMPREHENSIVE METABOLIC PANEL
ALT: 39 U/L (ref 0–44)
AST: 53 U/L — ABNORMAL HIGH (ref 15–41)
Albumin: 2.5 g/dL — ABNORMAL LOW (ref 3.5–5.0)
Alkaline Phosphatase: 74 U/L (ref 38–126)
Anion gap: 8 (ref 5–15)
BUN: 63 mg/dL — ABNORMAL HIGH (ref 8–23)
CO2: 21 mmol/L — ABNORMAL LOW (ref 22–32)
Calcium: 7.8 mg/dL — ABNORMAL LOW (ref 8.9–10.3)
Chloride: 106 mmol/L (ref 98–111)
Creatinine, Ser: 1.62 mg/dL — ABNORMAL HIGH (ref 0.44–1.00)
GFR, Estimated: 35 mL/min — ABNORMAL LOW (ref >60)
Glucose, Bld: 135 mg/dL — ABNORMAL HIGH (ref 70–99)
Potassium: 4.1 mmol/L (ref 3.5–5.1)
Sodium: 135 mmol/L (ref 135–145)
Total Bilirubin: 1.3 mg/dL — ABNORMAL HIGH (ref 0.3–1.2)
Total Protein: 5.4 g/dL — ABNORMAL LOW (ref 6.5–8.1)

## 2021-11-05 LAB — GLUCOSE, CAPILLARY
Glucose-Capillary: 119 mg/dL — ABNORMAL HIGH (ref 70–99)
Glucose-Capillary: 119 mg/dL — ABNORMAL HIGH (ref 70–99)
Glucose-Capillary: 184 mg/dL — ABNORMAL HIGH (ref 70–99)
Glucose-Capillary: 178 mg/dL — ABNORMAL HIGH (ref 70–99)
Glucose-Capillary: 168 mg/dL — ABNORMAL HIGH (ref 70–99)
Glucose-Capillary: 167 mg/dL — ABNORMAL HIGH (ref 70–99)

## 2021-11-05 LAB — CULTURE, BLOOD (ROUTINE X 2)
Special Requests: ADEQUATE
Special Requests: ADEQUATE

## 2021-11-05 LAB — HEMOGLOBIN A1C
Hgb A1c MFr Bld: 6.9 % — ABNORMAL HIGH (ref 4.8–5.6)
Mean Plasma Glucose: 151.33 mg/dL

## 2021-11-05 LAB — CBC
HCT: 25.2 % — ABNORMAL LOW (ref 36.0–46.0)
Hemoglobin: 8.9 g/dL — ABNORMAL LOW (ref 12.0–15.0)
MCH: 30.4 pg (ref 26.0–34.0)
MCHC: 35.3 g/dL (ref 30.0–36.0)
MCV: 86 fL (ref 80.0–100.0)
Platelets: 59 10*3/uL — ABNORMAL LOW (ref 150–400)
RBC: 2.93 MIL/uL — ABNORMAL LOW (ref 3.87–5.11)
RDW: 13.6 % (ref 11.5–15.5)
WBC: 8.2 10*3/uL (ref 4.0–10.5)
nRBC: 0 % (ref 0.0–0.2)

## 2021-11-05 LAB — URINE CULTURE: Culture: 3000 — AB

## 2021-11-05 LAB — MAGNESIUM: Magnesium: 2.5 mg/dL — ABNORMAL HIGH (ref 1.7–2.4)

## 2021-11-05 LAB — PHOSPHORUS: Phosphorus: 2.8 mg/dL (ref 2.5–4.6)

## 2021-11-05 MED ORDER — CEPHALEXIN 500 MG PO CAPS
500.0000 mg | ORAL_CAPSULE | Freq: Three times a day (TID) | ORAL | Status: DC
  Administered 2021-11-06 – 2021-11-08 (×9): 500 mg via ORAL
  Filled 2021-11-05 (×10): qty 1

## 2021-11-05 MED ORDER — DILTIAZEM HCL 60 MG PO TABS
30.0000 mg | ORAL_TABLET | Freq: Four times a day (QID) | ORAL | Status: DC
  Administered 2021-11-05 – 2021-11-07 (×7): 30 mg via ORAL
  Filled 2021-11-05 (×9): qty 0.5

## 2021-11-05 MED ORDER — INSULIN ASPART 100 UNIT/ML IJ SOLN
0.0000 [IU] | Freq: Three times a day (TID) | INTRAMUSCULAR | Status: DC
  Administered 2021-11-05 – 2021-11-06 (×3): 4 [IU] via SUBCUTANEOUS
  Administered 2021-11-06: 7 [IU] via SUBCUTANEOUS
  Administered 2021-11-06: 4 [IU] via SUBCUTANEOUS
  Administered 2021-11-07: 11 [IU] via SUBCUTANEOUS
  Administered 2021-11-07 – 2021-11-08 (×3): 4 [IU] via SUBCUTANEOUS
  Filled 2021-11-05 (×9): qty 1

## 2021-11-05 MED ORDER — DILTIAZEM HCL 30 MG PO TABS
30.0000 mg | ORAL_TABLET | Freq: Four times a day (QID) | ORAL | Status: DC
  Filled 2021-11-05 (×4): qty 1

## 2021-11-05 MED ORDER — METOPROLOL TARTRATE 5 MG/5ML IV SOLN: 5.0000 mg | INTRAVENOUS | Status: DC | PRN

## 2021-11-05 MED ORDER — HYDROCORTISONE SOD SUC (PF) 100 MG IJ SOLR
50.0000 mg | Freq: Three times a day (TID) | INTRAMUSCULAR | Status: DC
  Administered 2021-11-05 – 2021-11-06 (×3): 50 mg via INTRAVENOUS
  Filled 2021-11-05: qty 1
  Filled 2021-11-05: qty 2
  Filled 2021-11-05 (×2): qty 1

## 2021-11-05 MED ORDER — MIDODRINE HCL 5 MG PO TABS: 5.0000 mg | ORAL_TABLET | Freq: Three times a day (TID) | ORAL | Status: DC

## 2021-11-05 MED ORDER — METRONIDAZOLE 500 MG PO TABS
500.0000 mg | ORAL_TABLET | Freq: Two times a day (BID) | ORAL | Status: DC
  Administered 2021-11-05 – 2021-11-07 (×4): 500 mg via ORAL
  Filled 2021-11-05 (×6): qty 1

## 2021-11-05 NOTE — Consult Note (Signed)
PHARMACY CONSULT NOTE  Pharmacy Consult for Electrolyte Monitoring and Replacement   Recent Labs: Potassium (mmol/L)  Date Value  11/05/2021 4.1   Magnesium (mg/dL)  Date Value  91/66/0600 2.5 (H)   Calcium (mg/dL)  Date Value  45/99/7741 7.8 (L)   Albumin (g/dL)  Date Value  42/39/5320 2.5 (L)  08/30/2021 4.3   Phosphorus (mg/dL)  Date Value  23/34/3568 2.8   Sodium (mmol/L)  Date Value  11/05/2021 135  08/30/2021 141   Assessment: Patient is a 66 y/o F with medical history including HTN, HLD, GERD, obesity, diabetes who is admitted with septic shock secondary to acute cholecystitis. Patient had percutaneous cholecystostomy tube placed emergently by IR 1/13. Patient disposition is the ICU. Pharmacy consulted to assist with electrolyte monitoring and replacement.  Goal of Therapy:  Electrolytes within normal limits  Plan:  --No electrolyte replacement indicated at this time --Patient care transferred from [facility] to [facility]. Will discontinue electrolyte consult at this time. Defer further ordering of labs and electrolyte replacement to hospitalist --Pharmacy will continue to monitor peripherally  Tressie Ellis 11/05/2021 7:59 AM

## 2021-11-05 NOTE — Progress Notes (Signed)
PROGRESS NOTE    Amanda [name]  NGE:[id number] DOB: 01-04-1956 DOA: 11/02/2021 PCP: Loura Pardon, MD     Brief Narrative:  Amanda [name] is a 67 year old female with past medical history significant for HTN, HLD, GERD, DM who was admitted to ICU with septic shock secondary to acute cholecystitis.  Patient had percutaneous cholecystostomy tube placed by IR on 1/13.  She required Levophed due to septic shock.  Blood cultures grew Enterobacter, E. coli.  She [facility] transferred [facility] 1/[facility].  New events last 24 hours / Subjective: Patient sitting in bed, feeling well.  She denies abdominal pain or nausea or vomiting.  Blood pressure improved and now weaning down on Solu-Cortef and midodrine.  Assessment & Plan:   Principal Problem:   Septic shock (HCC) Active Problems:   Cholecystitis   Septic shock secondary to acute cholecystitis, Enterobacter and E. coli bacteremia, UTI  -Sepsis present on admission -Now off vasopressor.  Wean down on Solu-Cortef and midodrine -Status post percutaneous cholecystostomy tube 1/13 by IR which will remain in place for 6-8 weeks -Appreciate general surgery, follow-up as outpatient in 2-3 weeks  -Continue Rocephin, can likely deescalate to cipro on discharge   Pancytopenia -Likely secondary to sepsis -Stop subq hep  -Monitor  Demand ischemia -Secondary to sepsis   AKI on CKD stage 3a -Baseline Cr 1 -Improving  -Tolerating PO   Diabetes mellitus, well controlled  -A1c 6.7 in Nov 2022  -SSI    DVT prophylaxis:  SCDs Start: 11/02/21 1409  Code Status: Full code Family Communication: Sister at bedside Disposition Plan:  Status is: Inpatient  Remains inpatient appropriate because: Remains on IV antibiotics, wean down on Solu-Cortef and midodrine    Antimicrobials:  Anti-infectives (From admission, onward)    Start     Dose/Rate Route Frequency Ordered Stop   11/03/21 1500  anidulafungin (ERAXIS) 100 mg in sodium  chloride 0.9 % 100 mL IVPB  Status:  Discontinued        100 mg 78 mL/hr over 100 Minutes Intravenous Every 24 hours 11/02/21 1401 11/04/21 0857   11/03/21 1000  cefTRIAXone (ROCEPHIN) 2 g in sodium chloride 0.9 % 100 mL IVPB        2 g 200 mL/hr over 30 Minutes Intravenous Every 24 hours 11/02/21 1413     11/02/21 2200  metroNIDAZOLE (FLAGYL) IVPB 500 mg  Status:  Discontinued        500 mg 100 mL/hr over 60 Minutes Intravenous Every 12 hours 11/02/21 1413 11/04/21 0857   11/02/21 1500  anidulafungin (ERAXIS) 200 mg in sodium chloride 0.9 % 200 mL IVPB        200 mg 78 mL/hr over 200 Minutes Intravenous  Once 11/02/21 1401 11/03/21 0828   11/02/21 1045  ceFEPIme (MAXIPIME) 2 g in sodium chloride 0.9 % 100 mL IVPB        2 g 200 mL/hr over 30 Minutes Intravenous  Once 11/02/21 1031 11/02/21 1159   11/02/21 1045  metroNIDAZOLE (FLAGYL) IVPB 500 mg        500 mg 100 mL/hr over 60 Minutes Intravenous  Once 11/02/21 1031 11/02/21 1305        Objective: Vitals:   11/05/21 0700 11/05/21 0716 11/05/21 0800 11/05/21 0900  BP: 131/79  (!) 147/77 (!) 156/85  Pulse: 72 72 72   Resp: 20 19 15  (!) 23  Temp:   97.8 F (36.6 C)   TempSrc:   Oral   SpO2:  95% 90% 92%   Weight:      Height:        Intake/Output Summary (Last 24 hours) at 11/05/2021 1032 Last data filed at 11/05/2021 0914 Gross per 24 hour  Intake 903.18 ml  Output 1575 ml  Net -671.82 ml   Filed Weights   11/03/21 0500 11/04/21 0500 11/05/21 0455  Weight: 79.5 kg 79.7 kg 79 kg    Examination:  General exam: Appears calm and comfortable  Respiratory system: Clear to auscultation. Respiratory effort normal. No respiratory distress. No conversational dyspnea.  Cardiovascular system: S1 & S2 heard, RRR. No murmurs. No pedal edema. Gastrointestinal system: Abdomen is nondistended, soft and nontender. Normal bowel sounds heard. Central nervous system: Alert and oriented. No focal neurological deficits. Speech clear.   Extremities: Symmetric in appearance  Skin: No rashes, lesions or ulcers on exposed skin  Psychiatry: Judgement and insight appear normal. Mood & affect appropriate.   Data Reviewed: I have personally reviewed following labs and imaging studies  CBC: Recent Labs  Lab 11/02/21 0943 11/03/21 0806 11/04/21 0614 11/05/21 0445  WBC 1.3*   1.3* 18.0* 8.4 8.2  NEUTROABS 0.9*  --   --   --   HGB 11.7*   11.8* 10.6* 9.4* 8.9*  HCT 34.7*   34.7* 30.2* 27.1* 25.2*  MCV 89.0   89.4 85.3 86.3 86.0  PLT 129*   129* 111* 62* 59*   Basic Metabolic Panel: Recent Labs  Lab 11/02/21 0943 11/03/21 0806 11/03/21 1844 11/04/21 0614 11/04/21 1837 11/05/21 0445  NA 132* 131*  --  134* 135 135  K 4.2 3.9  --  3.3* 3.1* 4.1  CL 94* 99  --  103 105 106  CO2 21* 21*  --  20* 21* 21*  GLUCOSE 191* 136*  --  97 153* 135*  BUN 66* 58*  --  59* 59* 63*  CREATININE 2.89* 2.05*  --  1.79* 1.67* 1.62*  CALCIUM 8.6* 7.4*  --  7.5* 7.5* 7.8*  MG  --  1.2* 1.8 1.9 1.9 2.5*  PHOS  --  2.9  --  2.4* 3.0 2.8   GFR: Estimated Creatinine Clearance: 31.8 mL/min (A) (by C-G formula based on SCr of 1.62 mg/dL (H)). Liver Function Tests: Recent Labs  Lab 11/02/21 0943 11/03/21 0806 11/04/21 0614 11/04/21 1837 11/05/21 0445  AST 45* 84* 62* 52* 53*  ALT 21 51* 41 38 39  ALKPHOS 186* 94 79 86 74  BILITOT 1.7* 1.5* 0.6 1.4* 1.3*  PROT 7.4 5.3* 4.7* 5.1* 5.4*  ALBUMIN 3.3* 2.1* 1.9* 1.9* 2.5*   Recent Labs  Lab 11/02/21 0943  LIPASE 21   No results for input(s): AMMONIA in the last 168 hours. Coagulation Profile: Recent Labs  Lab 11/02/21 0943  INR 1.2   Cardiac Enzymes: No results for input(s): CKTOTAL, CKMB, CKMBINDEX, TROPONINI in the last 168 hours. BNP (last 3 results) No results for input(s): PROBNP in the last 8760 hours. HbA1C: No results for input(s): HGBA1C in the last 72 hours. CBG: Recent Labs  Lab 11/04/21 1537 11/04/21 1914 11/04/21 2306 11/05/21 0323 11/05/21 0726   GLUCAP 164* 139* 126* 119* 119*   Lipid Profile: No results for input(s): CHOL, HDL, LDLCALC, TRIG, CHOLHDL, LDLDIRECT in the last 72 hours. Thyroid Function Tests: No results for input(s): TSH, T4TOTAL, FREET4, T3FREE, THYROIDAB in the last 72 hours. Anemia Panel: No results for input(s): VITAMINB12, FOLATE, FERRITIN, TIBC, IRON, RETICCTPCT in the last 72 hours. Sepsis Labs: Recent Labs  Lab 11/02/21 0943 11/02/21 1121 11/02/21 1656 11/03/21 0011 11/03/21 0806 11/04/21 0614  PROCALCITON 143.43  --   --   --  120.77 72.87  LATICACIDVEN 5.9* 4.2* 2.8* 2.2*  --   --     Recent Results (from the past 240 hour(s))  Resp Panel by RT-PCR (Flu A&B, Covid) Nasopharyngeal Swab     Status: None   Collection Time: 11/02/21  9:43 AM   Specimen: Nasopharyngeal Swab; Nasopharyngeal(NP) swabs in vial transport medium  Result Value Ref Range Status   SARS Coronavirus 2 by RT PCR NEGATIVE NEGATIVE Final    Comment: (NOTE) SARS-CoV-2 target nucleic acids are NOT DETECTED.  The SARS-CoV-2 RNA is generally detectable in upper respiratory specimens during the acute phase of infection. The lowest concentration of SARS-CoV-2 viral copies this assay can detect is 138 copies/mL. A negative result does not preclude SARS-Cov-2 infection and should not be used as the sole basis for treatment or other patient management decisions. A negative result may occur with  improper specimen collection/handling, submission of specimen other than nasopharyngeal swab, presence of viral mutation(s) within the areas targeted by this assay, and inadequate number of viral copies(<138 copies/mL). A negative result must be combined with clinical observations, patient history, and epidemiological information. The expected result is Negative.  Fact Sheet for Patients:  EntrepreneurPulse.com.au  Fact Sheet for Healthcare Providers:  IncredibleEmployment.be  This test is no t yet  approved or cleared by the [place] FDA and  has been authorized for detection and/or diagnosis of SARS-CoV-2 by FDA under an Emergency Use Authorization (EUA). This EUA will remain  in effect (meaning this test can be used) for the duration of the COVID-19 declaration under Section 564(b)(1) of the Act, 21 U.S.C.section 360bbb-3(b)(1), unless the authorization is terminated  or revoked sooner.       Influenza A by PCR NEGATIVE NEGATIVE Final   Influenza B by PCR NEGATIVE NEGATIVE Final    Comment: (NOTE) The Xpert Xpress SARS-CoV-2/FLU/RSV plus assay is intended as an aid in the diagnosis of influenza from Nasopharyngeal swab specimens and should not be used as a sole basis for treatment. Nasal washings and aspirates are unacceptable for Xpert Xpress SARS-CoV-2/FLU/RSV testing.  Fact Sheet for Patients: EntrepreneurPulse.com.au  Fact Sheet for Healthcare Providers: IncredibleEmployment.be  This test is not yet approved or cleared by the [place] FDA and has been authorized for detection and/or diagnosis of SARS-CoV-2 by FDA under an Emergency Use Authorization (EUA). This EUA will remain in effect (meaning this test can be used) for the duration of the COVID-19 declaration under Section 564(b)(1) of the Act, 21 U.S.C. section 360bbb-3(b)(1), unless the authorization is terminated or revoked.  Performed at [facility], [address] [place], [place] [address]   Blood culture (routine x 2)     Status: Abnormal   Collection Time: 11/02/21  9:46 AM   Specimen: BLOOD RIGHT WRIST  Result Value Ref Range Status   Specimen Description   Final    BLOOD RIGHT WRIST Performed at [facility], [address] [place], [place] [address]    Special Requests   Final    BOTTLES DRAWN AEROBIC AND ANAEROBIC Blood Culture adequate volume Performed at [facility], [address] [place], [place] [address]     Culture  Setup Time   Final    GRAM NEGATIVE RODS IN BOTH AEROBIC AND ANAEROBIC BOTTLES CRITICAL VALUE NOTED.  VALUE IS CONSISTENT WITH PREVIOUSLY REPORTED AND CALLED VALUE. Performed  at [facility], [address] [place], [place] [address]    Culture (A)  Final    ESCHERICHIA COLI SUSCEPTIBILITIES PERFORMED ON PREVIOUS CULTURE WITHIN THE LAST 5 DAYS. Performed at [facility], [address] [place], [place] [address]    Report Status 11/05/2021 FINAL  Final  Blood culture (routine x 2)     Status: Abnormal   Collection Time: 11/02/21  9:46 AM   Specimen: BLOOD LEFT ARM  Result Value Ref Range Status   Specimen Description   Final    BLOOD LEFT ARM Performed at [facility], [address], [place], [place] [address]    Special Requests   Final    BOTTLES DRAWN AEROBIC AND ANAEROBIC Blood Culture adequate volume Performed at [facility], [address] [place], [place] [address]    Culture  Setup Time   Final    Organism ID to follow GRAM NEGATIVE RODS IN BOTH AEROBIC AND ANAEROBIC BOTTLES CRITICAL RESULT CALLED TO, READ BACK BY AND VERIFIED WITH: SUSAN WATSON AT 1948 ON 11/02/21 BY SS Performed at [facility], [address] [place], [place] [address]    Culture ESCHERICHIA COLI (A)  Final   Report Status 11/05/2021 FINAL  Final   Organism ID, Bacteria ESCHERICHIA COLI  Final      Susceptibility   Escherichia coli - MIC*    AMPICILLIN <=2 SENSITIVE Sensitive     CEFAZOLIN <=4 SENSITIVE Sensitive     CEFEPIME <=0.12 SENSITIVE Sensitive     CEFTAZIDIME <=1 SENSITIVE Sensitive     CEFTRIAXONE <=0.25 SENSITIVE Sensitive     CIPROFLOXACIN <=0.25 SENSITIVE Sensitive     GENTAMICIN <=1 SENSITIVE Sensitive     IMIPENEM <=0.25 SENSITIVE Sensitive     TRIMETH/SULFA <=20 SENSITIVE Sensitive     AMPICILLIN/SULBACTAM <=2 SENSITIVE Sensitive     PIP/TAZO <=4 SENSITIVE Sensitive     * ESCHERICHIA COLI  Blood Culture ID Panel  (Reflexed)     Status: Abnormal   Collection Time: 11/02/21  9:46 AM  Result Value Ref Range Status   Enterococcus faecalis NOT DETECTED NOT DETECTED Final   Enterococcus Faecium NOT DETECTED NOT DETECTED Final   Listeria monocytogenes NOT DETECTED NOT DETECTED Final   Staphylococcus species NOT DETECTED NOT DETECTED Final   Staphylococcus aureus (BCID) NOT DETECTED NOT DETECTED Final   Staphylococcus epidermidis NOT DETECTED NOT DETECTED Final   Staphylococcus lugdunensis NOT DETECTED NOT DETECTED Final   Streptococcus species NOT DETECTED NOT DETECTED Final   Streptococcus agalactiae NOT DETECTED NOT DETECTED Final   Streptococcus pneumoniae NOT DETECTED NOT DETECTED Final   Streptococcus pyogenes NOT DETECTED NOT DETECTED Final   A.calcoaceticus-baumannii NOT DETECTED NOT DETECTED Final   Bacteroides fragilis NOT DETECTED NOT DETECTED Final   Enterobacterales DETECTED (A) NOT DETECTED Final    Comment: Enterobacterales represent a large order of gram negative bacteria, not a single organism. CRITICAL RESULT CALLED TO, READ BACK BY AND VERIFIED WITH: SUSAN WATSON AT 1948 ON 11/02/21 BY SS    Enterobacter cloacae complex NOT DETECTED NOT DETECTED Final   Escherichia coli DETECTED (A) NOT DETECTED Final    Comment: CRITICAL RESULT CALLED TO, READ BACK BY AND VERIFIED WITH: SUSAN WATSON AT 1948 ON 11/02/21 BY SS    Klebsiella aerogenes NOT DETECTED NOT DETECTED Final   Klebsiella oxytoca NOT DETECTED NOT DETECTED Final   Klebsiella pneumoniae NOT DETECTED NOT DETECTED Final   Proteus species NOT DETECTED NOT DETECTED Final  Salmonella species NOT DETECTED NOT DETECTED Final   Serratia marcescens NOT DETECTED NOT DETECTED Final   Haemophilus influenzae NOT DETECTED NOT DETECTED Final   Neisseria meningitidis NOT DETECTED NOT DETECTED Final   Pseudomonas aeruginosa NOT DETECTED NOT DETECTED Final   Stenotrophomonas maltophilia NOT DETECTED NOT DETECTED Final   Candida albicans NOT  DETECTED NOT DETECTED Final   Candida auris NOT DETECTED NOT DETECTED Final   Candida glabrata NOT DETECTED NOT DETECTED Final   Candida krusei NOT DETECTED NOT DETECTED Final   Candida parapsilosis NOT DETECTED NOT DETECTED Final   Candida tropicalis NOT DETECTED NOT DETECTED Final   Cryptococcus neoformans/gattii NOT DETECTED NOT DETECTED Final   CTX-M ESBL NOT DETECTED NOT DETECTED Final   Carbapenem resistance IMP NOT DETECTED NOT DETECTED Final   Carbapenem resistance KPC NOT DETECTED NOT DETECTED Final   Carbapenem resistance NDM NOT DETECTED NOT DETECTED Final   Carbapenem resist OXA 48 LIKE NOT DETECTED NOT DETECTED Final   Carbapenem resistance VIM NOT DETECTED NOT DETECTED Final    Comment: Performed at [facility], [address] [place], [place] [address]  Aerobic/Anaerobic Culture w Gram Stain (surgical/deep wound)     Status: None (Preliminary result)   Collection Time: 11/02/21  2:18 PM   Specimen: Gallbladder; Bile  Result Value Ref Range Status   Specimen Description   Final    GALL BLADDER Performed at [facility] [address], [place], [place] [address]    Special Requests   Final    NONE Performed at [facility], [address] [place], [place] [address]    Gram Stain   Final    FEW WBC PRESENT,BOTH PMN AND MONONUCLEAR FEW GRAM NEGATIVE RODS Performed at [facility], [address], [place], [place] [address]    Culture   Final    ABUNDANT GRAM NEGATIVE RODS IDENTIFICATION AND SUSCEPTIBILITIES TO FOLLOW NO ANAEROBES ISOLATED; CULTURE IN PROGRESS FOR 5 DAYS    Report Status PENDING  Incomplete  MRSA Next Gen by PCR, Nasal     Status: None   Collection Time: 11/02/21  4:41 PM   Specimen: Nasal Mucosa; Nasal Swab  Result Value Ref Range Status   MRSA by PCR Next Gen NOT DETECTED NOT DETECTED Final    Comment: (NOTE) The GeneXpert MRSA Assay (FDA approved for NASAL specimens only), is one component of a  comprehensive MRSA colonization surveillance program. It is not intended to diagnose MRSA infection nor to guide or monitor treatment for MRSA infections. Test performance is not FDA approved in patients less than 4 years old. Performed at [facility], [address] [place], [place] [address]   Urine Culture     Status: Abnormal   Collection Time: 11/02/21  4:56 PM   Specimen: In/Out Cath Urine  Result Value Ref Range Status   Specimen Description   Final    IN/OUT CATH URINE Performed at [facility], [address], [place], [place] [address]    Special Requests   Final    NONE Performed at [facility], [place], [place] [address]    Culture 3,000 COLONIES/mL ESCHERICHIA COLI (A)  Final   Report Status 11/05/2021 FINAL  Final   Organism ID, Bacteria ESCHERICHIA COLI (A)  Final      Susceptibility   Escherichia coli - MIC*    AMPICILLIN <=2 SENSITIVE Sensitive     CEFAZOLIN <=4 SENSITIVE Sensitive     CEFEPIME <=  0.12 SENSITIVE Sensitive     CEFTRIAXONE <=0.25 SENSITIVE Sensitive     CIPROFLOXACIN <=0.25 SENSITIVE Sensitive     GENTAMICIN <=1 SENSITIVE Sensitive     IMIPENEM <=0.25 SENSITIVE Sensitive     NITROFURANTOIN <=16 SENSITIVE Sensitive     TRIMETH/SULFA <=20 SENSITIVE Sensitive     AMPICILLIN/SULBACTAM <=2 SENSITIVE Sensitive     PIP/TAZO <=4 SENSITIVE Sensitive     * 3,000 COLONIES/mL ESCHERICHIA COLI      Radiology Studies: No results found.    Scheduled Meds:  Chlorhexidine Gluconate Cloth  6 each Topical Daily   heparin  5,000 Units Subcutaneous Q8H   hydrocortisone sod succinate (SOLU-CORTEF) inj  50 mg Intravenous Q8H   insulin aspart  0-20 Units Subcutaneous TID WC   mouth rinse  15 mL Mouth Rinse BID   midodrine  5 mg Oral TID WC   pantoprazole (PROTONIX) IV  40 mg Intravenous Q24H   sodium chloride flush  5 mL Intracatheter Q8H   Continuous Infusions:  albumin human 25 g (11/05/21 0911)    cefTRIAXone (ROCEPHIN)  IV 2 g (11/05/21 0914)     LOS: 3 days     Dessa Phi, DO [facility] 11/05/2021, 10:32 AM   Available via Epic secure chat 7am-7pm After these hours, please refer to coverage provider listed on [email or web address]

## 2021-11-05 NOTE — Evaluation (Signed)
Occupational Therapy Evaluation Patient Details Name: Amanda [name] MRN: [id number] DOB: 1956-08-20 Today's Date: 11/05/2021   History of Present Illness This is a 66 y.o. female with acute cholecystitis presenting with septic shock. S/p Cholecstomy 1/13.   Clinical Impression   Pt was seen for OT evaluation this date. Prior to hospital admission, pt was independent and working in a daycare. Pt lives with her spouse who she reports is able to assist as needed. Pt received seated in the recliner, endorsing fatigue and ready to return to bed. Pt encouraged to use bathroom first and agreeable. Pt stood from recliner and std height toilet with SBA + VC for RW use/hand placement to improve technique. Pt endorsed fatigue quickly and abdominal discomfort with upright positioning involved with transfers. Set up and supv for seated pericare. Pt able to walk from toilet back to bed ~10' +RW and SBA. Respiration rate up to 30's with exertion. HR in 80's. Currently pt demonstrates impairments as described below (See OT problem list) which functionally limit her ability to perform ADL/self-care tasks. Pt currently requires PRN MIN A for LB ADL, SBA for ADL transfers. Pt educated in role of OT, AE/DME, home/routines modifications for ADL, and ECS to support safety and independence. Pt verbalized understanding. Pt would benefit from skilled OT services to address noted impairments and functional limitations (see below for any additional details) in order to maximize safety and independence while minimizing falls risk and caregiver burden. Upon hospital discharge, recommend HHOT to maximize pt safety and return to functional independence during meaningful occupations of daily life.   Recommendations for follow up therapy are one component of a multi-disciplinary discharge planning process, led by the attending physician.  Recommendations may be updated based on patient status, additional functional criteria and insurance  authorization.   Follow Up Recommendations  Home health OT    Assistance Recommended at Discharge Intermittent Supervision/Assistance  Patient can return home with the following A little help with walking and/or transfers;A little help with bathing/dressing/bathroom;Assistance with cooking/housework;Assist for transportation;Help with stairs or ramp for entrance    Functional Status Assessment  Patient has had a recent decline in their functional status and demonstrates the ability to make significant improvements in function in a reasonable and predictable amount of time.  Equipment Recommendations  BSC/3in1;Other (comment);Tub/shower seat Lexicographer)    Recommendations for Other Services       Precautions / Restrictions Precautions Precautions: Fall Restrictions Weight Bearing Restrictions: No      Mobility Bed Mobility Overal bed mobility: Needs Assistance Bed Mobility: Sit to Supine      Sit to supine: Supervision   General bed mobility comments: use of bed rail, increased time    Transfers Overall transfer level: Needs assistance Equipment used: Rolling walker (2 wheels) Transfers: Sit to/from Stand Sit to Stand: Supervision;Min guard           General transfer comment: able to stand without AD      Balance Overall balance assessment: Needs assistance Sitting-balance support: Feet unsupported;Bilateral upper extremity supported Sitting balance-Leahy Scale: Fair     Standing balance support: During functional activity;Reliant on assistive device for balance Standing balance-Leahy Scale: Fair Standing balance comment: able to stand without need for UE support on AD.                           ADL either performed or assessed with clinical judgement   ADL  General ADL Comments: PRN MIN A for LB ADL, SBA for ADL trasnfers + RW     Vision         Perception     Praxis       Pertinent Vitals/Pain Pain Assessment: 0-10 Pain Score: 6  Pain Location: Abdomen Pain Descriptors / Indicators: Aching Pain Intervention(s): Limited activity within patient's tolerance;Monitored during session;Repositioned     Hand Dominance     Extremity/Trunk Assessment Upper Extremity Assessment Upper Extremity Assessment: Generalized weakness   Lower Extremity Assessment Lower Extremity Assessment: Generalized weakness   Cervical / Trunk Assessment Cervical / Trunk Assessment: Normal   Communication Communication Communication: No difficulties   Cognition Arousal/Alertness: Awake/alert Behavior During Therapy: WFL for tasks assessed/performed Overall Cognitive Status: Within Functional Limits for tasks assessed                                       General Comments      Exercises Other Exercises Other Exercises: Pt educated in role of OT, AE/DME, home/routines modifications for ADL, and ECS to support safety and independence   Shoulder Instructions      Home Living Family/patient expects to be discharged to:: Private residence Living Arrangements: Spouse/significant other Available Help at Discharge: Available 24 hours/day (limited assist) Type of Home: Mobile home Home Access: Stairs to enter [employer] of Steps: 4-5 at front and back Entrance Stairs-Rails: Right;Left;Can reach both Home Layout: One level     Bathroom Shower/Tub: Chief Strategy Officer: Standard     Home Equipment: None          Prior Functioning/Environment Prior Level of Function : Independent/Modified Independent;Working/employed;History of Falls (last six months);Driving               ADLs Comments: works in a daycare, 1 fall in 62mo recent        OT Problem List: Decreased knowledge of use of DME or AE;Decreased activity tolerance;Decreased strength      OT Treatment/Interventions: Self-care/ADL training;Therapeutic  exercise;Therapeutic activities;Energy conservation;DME and/or AE instruction;Patient/family education    OT Goals(Current goals can be found in the care plan section) Acute Rehab OT Goals Patient Stated Goal: get rest and go home OT Goal Formulation: With patient Time For Goal Achievement: 11/19/21 Potential to Achieve Goals: Good ADL Goals Pt Will Perform Lower Body Dressing: with modified independence;sit to/from stand Pt Will Transfer to Toilet: with modified independence;ambulating (elevated commode, LRAD PRN) Additional ADL Goal #1: Pt will verbalize plan to implement at least 2 learned ECS into daily ADL routine to minimize over exertion  OT Frequency: Min 2X/week    Co-evaluation              AM-PAC OT "6 Clicks" Daily Activity     Outcome Measure Help from another person eating meals?: None Help from another person taking care of personal grooming?: None Help from another person toileting, which includes using toliet, bedpan, or urinal?: A Little Help from another person bathing (including washing, rinsing, drying)?: A Little Help from another person to put on and taking off regular upper body clothing?: None Help from another person to put on and taking off regular lower body clothing?: A Little 6 Click Score: 21   End of Session Equipment Utilized During Treatment: Rolling walker (2 wheels)  Activity Tolerance: Patient tolerated treatment well Patient left: in bed;with call bell/phone within reach  OT Visit  Diagnosis: Other abnormalities of gait and mobility (R26.89);Muscle weakness (generalized) (M62.81)                Time: 4035-2481 OT Time Calculation (min): 31 min Charges:  OT General Charges $OT Visit: 1 Visit OT Evaluation $OT Eval Moderate Complexity: 1 Mod OT Treatments $Self Care/Home Management : 8-22 mins  Arman Filter., MPH, MS, OTR/L ascom [phone] 11/05/21, 2:16 PM

## 2021-11-05 NOTE — Progress Notes (Signed)
Evening blood sugar 168

## 2021-11-05 NOTE — Evaluation (Signed)
Physical Therapy Evaluation Patient Details Name: Amanda [name] MRN: [id number] DOB: July 22, 1956 Today's Date: 11/05/2021  History of Present Illness  This is a 66 y.o. female with acute cholecystitis presenting with septic shock. S/p Cholecstomy 1/13.  Clinical Impression  Pt admitted with above diagnosis. Pt received in bed with sister present. Able to report home lay out, DME, PLOF, without difficulty. Reports being indep at baseline for mobility, ADL's/IADL's was working until hospitalization. Pt has excellent family support in sister, available for support except on Friday's. Pt able to transfer to EoB with increased time and stand to RW with supervision. Pt tolerated ambulating ~32' with RW with slow and cautious gait, does have SOB requiring 2 standing rest breaks during ambulation. Pt is not unsteady but appears very weak and deconditioned due to RR up to 40 BPM with very short bouts of ambulation and need for standing rest breaks. Pt able to return to recliner with safe RW sequencing and use of hands and attain sitting with supervision. HR remained in 80's BPM throughout mobility and Spo2 remaining >90%. All needs in place. Pt and sister educated on benefits of RW, 3 in 1 for elevated toileting/shower seat due to deconditioned state. In agreement of DME needs and d/c recs. Plan for pt to return home with [facility] PT services with frequent supervision for safety. Pt currently with functional limitations due to the deficits listed below (see PT Problem List). Pt will benefit from skilled PT to increase their independence and safety with mobility to allow discharge to the venue listed below.     Recommendations for follow up therapy are one component of a multi-disciplinary discharge planning process, led by the attending physician.  Recommendations may be updated based on patient status, additional functional criteria and insurance authorization.  Follow Up Recommendations Home health PT    Assistance  Recommended at Discharge Frequent or constant Supervision/Assistance  Patient can return home with the following  A little help with bathing/dressing/bathroom;Assistance with cooking/housework;Assist for transportation;Help with stairs or ramp for entrance;A little help with walking and/or transfers    Equipment Recommendations Rolling walker (2 wheels);BSC/3in1  Recommendations for Other Services       Functional Status Assessment Patient has had a recent decline in their functional status and demonstrates the ability to make significant improvements in function in a reasonable and predictable amount of time.     Precautions / Restrictions Precautions Precautions: Fall Restrictions Weight Bearing Restrictions: No      Mobility  Bed Mobility Overal bed mobility: Needs Assistance Bed Mobility: Supine to Sit     Supine to sit: Supervision;HOB elevated     General bed mobility comments: use of bed rail, increased time Patient Response: Cooperative  Transfers Overall transfer level: Needs assistance   Transfers: Sit to/from Stand Sit to Stand: Supervision           General transfer comment: able to stand without AD    Ambulation/Gait Ambulation/Gait assistance: Min guard Gait Distance (Feet): 32 Feet Assistive device: Rolling walker (2 wheels) Gait Pattern/deviations: Step-to pattern;Decreased stride length Gait velocity: decreased     General Gait Details: cautious, required use of RW due to weakness  Stairs            Wheelchair Mobility    Modified Rankin (Stroke Patients Only)       Balance Overall balance assessment: Needs assistance Sitting-balance support: Feet unsupported;Bilateral upper extremity supported Sitting balance-Leahy Scale: Fair     Standing balance support: During functional activity;Reliant on  assistive device for balance Standing balance-Leahy Scale: Fair Standing balance comment: able to stand without need for UE support  on AD.                             Pertinent Vitals/Pain Pain Assessment: 0-10 Pain Score: 3  Pain Location: Abdomen Pain Descriptors / Indicators: Discomfort Pain Intervention(s): Limited activity within patient's tolerance;Monitored during session;Repositioned    Home Living Family/patient expects to be discharged to:: Private residence Living Arrangements: Spouse/significant other Available Help at Discharge: Available 24 hours/day (limited assist) Type of Home: Mobile home Home Access: Stairs to enter Entrance Stairs-Rails: Right;Left;Can reach both Entrance Stairs-Number of Steps: 4-5 at front and back   Home Layout: One level Home Equipment: None      Prior Function Prior Level of Function : Independent/Modified Independent;Working/employed                     Hand Dominance        Extremity/Trunk Assessment   Upper Extremity Assessment Upper Extremity Assessment: Defer to OT evaluation    Lower Extremity Assessment Lower Extremity Assessment: Generalized weakness    Cervical / Trunk Assessment Cervical / Trunk Assessment: Normal  Communication   Communication: No difficulties  Cognition Arousal/Alertness: Awake/alert Behavior During Therapy: WFL for tasks assessed/performed Overall Cognitive Status: Within Functional Limits for tasks assessed                                          General Comments General comments (skin integrity, edema, etc.): HR and SPo2 WNL at rest and with mobility.    Exercises Other Exercises Other Exercises: Role of PT in acute setting, DME and D/c recs. Energy conservation.   Assessment/Plan    PT Assessment Patient needs continued PT services  PT Problem List Decreased strength;Pain;Decreased activity tolerance;Decreased mobility       PT Treatment Interventions DME instruction;Therapeutic exercise;Gait training;Balance training;Stair training;Neuromuscular re-education;Functional  mobility training;Therapeutic activities;Patient/family education    PT Goals (Current goals can be found in the Care Plan section)  Acute Rehab PT Goals Patient Stated Goal: to return home PT Goal Formulation: With patient Time For Goal Achievement: 11/19/21 Potential to Achieve Goals: Good    Frequency Min 2X/week     Co-evaluation               AM-PAC PT "6 Clicks" Mobility  Outcome Measure Help needed turning from your back to your side while in a flat bed without using bedrails?: A Little Help needed moving from lying on your back to sitting on the side of a flat bed without using bedrails?: A Little Help needed moving to and from a bed to a chair (including a wheelchair)?: A Little Help needed standing up from a chair using your arms (e.g., wheelchair or bedside chair)?: A Little Help needed to walk in hospital room?: A Little Help needed climbing 3-5 steps with a railing? : A Lot 6 Click Score: 17    End of Session Equipment Utilized During Treatment: Gait belt Activity Tolerance: Patient tolerated treatment well;Patient limited by fatigue Patient left: in chair;with family/visitor present;with call bell/phone within reach Nurse Communication: Mobility status PT Visit Diagnosis: Other abnormalities of gait and mobility (R26.89);Muscle weakness (generalized) (M62.81)    Time: 3810-1751 PT Time Calculation (min) (ACUTE ONLY): 30 min   Charges:  PT Evaluation $PT Eval Moderate Complexity: 1 Mod PT Treatments $Therapeutic Activity: 8-22 mins        Leonetta Mcgivern M. Fairly IV, PT, DPT Physical Therapist- [facility]  11/05/2021, 10:27 AM

## 2021-11-05 NOTE — Progress Notes (Addendum)
°  PROGRESS NOTE  Patient went into new onset A fib this afternoon. She was resting in bed during event, rate 90-110s. SBP 140. Patient seen, she appears comfortable without distress. She has no symptoms of chest pain/pressure, SOB. EKG completed which confirm A Fib.   -Cardizem PO -Metoprolol IV PRN for HR sustaining >130 -Echo ordered  -Hold off on anticoag due to thrombocytopenia -Also her blood cx came back pansensitive, discussed with pharmacy and changed antibiotics to keflex/flagyl    Dessa Phi, DO [facility] 11/05/2021, 3:19 PM  Available via Epic secure chat 7am-7pm After these hours, please refer to coverage provider listed on [email or web address]

## 2021-11-05 NOTE — Discharge Instructions (Signed)
Drain care: Please flush the drainage catheter with 5 ml of NS once daily. Apply dry gauze dressing around and covering the drain to keep the area clean and dry.  Change dressing daily and as needed. Record drain output daily. It is normal for the output volume to vary from day to day.  Some days will be higher, some days will be lower.

## 2021-11-05 NOTE — Progress Notes (Signed)
Patient went into afib at about 1445 BP stable patient sleeping Dr notified orders received

## 2021-11-05 NOTE — Progress Notes (Signed)
1400 patient sister at bedside teaching with patient sister preforming flush to biliary drain since she will be doing this at home for patient. Patient also understands what is needs to e done

## 2021-11-05 NOTE — Progress Notes (Signed)
[facility] SURGICAL PROGRESS NOTE (cpt [phone])  Hospital Day(s): 3.   Interval History: Patient seen and examined, no acute events or new complaints overnight. Patient reports she is feeling better; mostly very tired this morning. No complaints of abdominal pain, nausea, emesis, fever. She cotinines to remain without need for vasopressor support. Her WBC has remained normal; sCr - 8.2K. Still with slight elevation in renal function; sCr - 1.62; UO - 1.4L. Cx from percutaneous cholecystostomy growing abundant GNR. Ucx with E coli. She continues on Rocephin.   Review of Systems:  Constitutional: denies fever, chills  HEENT: denies cough or congestion  Respiratory: denies any shortness of breath  Cardiovascular: denies chest pain or palpitations  Gastrointestinal: denies abdominal pain, N/V Genitourinary: denies burning with urination or urinary frequency Musculoskeletal: denies pain, decreased motor or sensation  Vital signs in last 24 hours: [min-max] current  Temp:  [97.1 F (36.2 C)-98.3 F (36.8 C)] 98.3 F (36.8 C) (01/16 0200) Pulse Rate:  [68-81] 68 (01/16 0400) Resp:  [15-25] 16 (01/16 0600) BP: (102-149)/(57-86) 141/70 (01/16 0600) SpO2:  [91 %-97 %] 94 % (01/16 0600) Weight:  [79 kg] 79 kg (01/16 0455)     Height: 5' (152.4 cm) Weight: 79 kg BMI (Calculated): 34.01   Intake/Output last 2 shifts:  01/15 0701 - 01/16 0700 In: 1130.5 [P.O.:360; I.V.:5; IV Piggyback:760.5] Out: [id number] [Urine:1450]   Physical Exam:  Constitutional: alert, cooperative and no distress  HENT: normocephalic without obvious abnormality  Eyes: PERRL, EOM's grossly intact and symmetric  Respiratory: breathing non-labored at rest  Cardiovascular: regular rate and sinus rhythm  Gastrointestinal: Soft, no appreciable tenderness, non-distended, no rebound/guarding. Percutaneous cholecystostomy tube in the RUQ; site is CDI; output is thin and bilious appearing Musculoskeletal: no edema or  wounds, motor and sensation grossly intact, NT    Labs:  CBC Latest Ref Rng & Units 11/05/2021 11/04/2021 11/03/2021  WBC 4.0 - 10.5 K/uL 8.2 8.4 18.0(H)  Hemoglobin 12.0 - 15.0 g/dL 8.9(L) 9.4(L) 10.6(L)  Hematocrit 36.0 - 46.0 % 25.2(L) 27.1(L) 30.2(L)  Platelets 150 - 400 K/uL 59(L) 62(L) 111(L)   CMP Latest Ref Rng & Units 11/05/2021 11/04/2021 11/04/2021  Glucose 70 - 99 mg/dL 135(H) 153(H) 97  BUN 8 - 23 mg/dL 63(H) 59(H) 59(H)  Creatinine 0.44 - 1.00 mg/dL 1.62(H) 1.67(H) 1.79(H)  Sodium 135 - 145 mmol/L 135 135 134(L)  Potassium 3.5 - 5.1 mmol/L 4.1 3.1(L) 3.3(L)  Chloride 98 - 111 mmol/L 106 105 103  CO2 22 - 32 mmol/L 21(L) 21(L) 20(L)  Calcium 8.9 - 10.3 mg/dL 7.8(L) 7.5(L) 7.5(L)  Total Protein 6.5 - 8.1 g/dL 5.4(L) 5.1(L) 4.7(L)  Total Bilirubin 0.3 - 1.2 mg/dL 1.3(H) 1.4(H) 0.6  Alkaline Phos 38 - 126 U/L 74 86 79  AST 15 - 41 U/L 53(H) 52(H) 62(H)  ALT 0 - 44 U/L 39 38 41     Imaging studies: No new pertinent imaging studies   Assessment/Plan: (ICD-10's: K81.0) 66 y.o. female with improving/resolved sepsis likely secondary to acute cholecystitis and concomitant urosepsis.    - Okay to continue diet as tolerated - Continue IV Abx (Rocephin); follow up Cx --> She will certainly need PO Abx for home   - Continue percutaneous cholecystostomy tube; monitor and record output; she will need to maintain this for 6-8 weeks then be re-evaluated for interval cholecystectomy   - Monitor abdominal examination; on-going bowel function   - Pain control prn; antiemetics prn - Further management per primary service; we will  follow  - General surgery will sign off and follow peripherally; She can follow up in 2-3 weeks in clinic. She will need Abx for home.    All of the above findings and recommendations were discussed with the patient, and the medical team, and all of patient's questions were answered to her expressed satisfaction.  -- Edison Simon, PA-C [facility] 11/05/2021, 7:18 AM [phone] M-F: 7am - 4pm

## 2021-11-05 NOTE — TOC Initial Note (Signed)
Transition of Care [facility]) - Initial/Assessment Note    Patient Details  Name: Amanda [name] MRN: [id number] Date of Birth: 1956/07/01  Transition of Care [facility]) CM/SW Contact:    Shelbie Hutching, RN Phone Number: 11/05/2021, 4:01 PM  Clinical Narrative:                 Patient admitted to the hospital with septic shock and acute cholelithiasis.  RNCM met with patient at the bedside, her sister is also present at the bedside.  Patient is from home where she lives with her significant other Fritz Pickerel.  She is independent, she does not drive but Fritz Pickerel or her sister provider her transportation.  She is current with her PCP and uses [facility] [place] for prescriptions.  Patient agrees with home health services, she does not have a preference in agency.  [place] with [facility] has accepted referral for RN, PT, and OT.  PT and OT have recommended RW and 3 in 1- both ordered from [facility] and will be delivered to the patient's room before discharge.      Expected Discharge Plan: [place] Barriers to Discharge: Continued Medical Work up   Patient Goals and CMS Choice Patient states their goals for this hospitalization and ongoing recovery are:: to get home CMS Medicare.gov Compare Post Acute Care list provided to:: Patient Choice offered to / list presented to : Patient  Expected Discharge Plan and Services Expected Discharge Plan: [place]   Discharge Planning Services: CM Consult Post Acute Care Choice: [place] arrangements for the past 2 months: Mobile Home                 DME Arranged: Walker rolling, 3-N-1 DME Agency: [facility] Date DME Agency Contacted: 11/05/21 Time DME Agency Contacted: 1600 Representative spoke with at DME Agency: [place] Arranged: RN, PT, OT [place] Agency: [place] Date [place]: 11/05/21 Time [place]: 1600 Representative spoke with at [place]: [place]  Prior Living  Arrangements/Services Living arrangements for the past 2 months: Mobile Home Lives with:: Significant Other Patient language and need for interpreter reviewed:: Yes Do you feel safe going back to the place where you live?: Yes      Need for Family Participation in Patient Care: Yes (Comment) Care giver support system in place?: Yes (comment) (sister and significant other)   Criminal Activity/Legal Involvement Pertinent to Current Situation/Hospitalization: No - Comment as needed  Activities of Daily Living Home Assistive Devices/Equipment: None ADL Screening (condition at time of admission) Patient's cognitive ability adequate to safely complete daily activities?: Yes Is the patient deaf or have difficulty hearing?: No Does the patient have difficulty seeing, even when wearing glasses/contacts?: No Does the patient have difficulty concentrating, remembering, or making decisions?: No Patient able to express need for assistance with ADLs?: Yes Does the patient have difficulty dressing or bathing?: Yes Independently performs ADLs?: No Communication: Independent Dressing (OT): Needs assistance Is this a change from baseline?: Change from baseline, expected to last <3days Grooming: Needs assistance Is this a change from baseline?: Change from baseline, expected to last <3 days Feeding: Needs assistance Is this a change from baseline?: Change from baseline, expected to last <3 days Bathing: Needs assistance Is this a change from baseline?: Change from baseline, expected to last <3 days Toileting: Needs assistance Is this a change from baseline?: Change from baseline, expected to last <3 days In/Out Bed: Needs assistance Is  this a change from baseline?: Change from baseline, expected to last <3 days Walks in Home: Independent Does the patient have difficulty walking or climbing stairs?: Yes Weakness of Legs: Both Weakness of Arms/Hands: Both  Permission Sought/Granted Permission  sought to share information with : Case Manager, Family Supports, Other (comment) Permission granted to share information with : Yes, Verbal Permission Granted  Share Information with NAME: Thea Gist  Permission granted to share info w AGENCY: home health agency  Permission granted to share info w Relationship: significant other  Permission granted to share info w Contact Information: [phone]  Emotional Assessment Appearance:: Appears stated age Attitude/Demeanor/Rapport: Engaged Affect (typically observed): Accepting Orientation: : Oriented to Self, Oriented to Place, Oriented to  Time, Oriented to Situation Alcohol / Substance Use: Not Applicable Psych Involvement: No (comment)  Admission diagnosis:  Cholecystitis [K81.9] Septic shock ([place]) [A41.9, R65.21] Patient Active Problem List   Diagnosis Date Noted   Cholecystitis    Septic shock ([place]) 11/02/2021   Need for influenza vaccination 09/03/2021   Diabetes mellitus without complication ([place]) 68/87/3730   B12 deficiency 12/26/2020   Obesity 03/01/2020   Insomnia 11/30/2019   GERD (gastroesophageal reflux disease) 11/30/2019   Hyperlipidemia associated with type 2 diabetes mellitus ([place])    Hypertension associated with diabetes ([place])    PCP:  Charlynne Cousins, MD Pharmacy:   [facility] [address] [place] [address] Phone: [phone] Fax: [phone]     Social Determinants of Health (SDOH) Interventions    Readmission Risk Interventions No flowsheet data found.

## 2021-11-06 ENCOUNTER — Inpatient Hospital Stay
Admit: 2021-11-06 | Discharge: 2021-11-06 | Disposition: A | Discharge status: Home / Self Care | Payer: Medicare HMO | Attending: Internal Medicine | Admitting: Internal Medicine

## 2021-11-06 ENCOUNTER — Ambulatory Visit: Payer: Medicare HMO | Admitting: Podiatry

## 2021-11-06 DIAGNOSIS — I248 Other forms of acute ischemic heart disease: Secondary | ICD-10-CM

## 2021-11-06 DIAGNOSIS — R7881 Bacteremia: Secondary | ICD-10-CM

## 2021-11-06 DIAGNOSIS — D61818 Other pancytopenia: Secondary | ICD-10-CM

## 2021-11-06 DIAGNOSIS — N179 Acute kidney failure, unspecified: Secondary | ICD-10-CM

## 2021-11-06 DIAGNOSIS — N189 Chronic kidney disease, unspecified: Secondary | ICD-10-CM

## 2021-11-06 DIAGNOSIS — I480 Paroxysmal atrial fibrillation: Secondary | ICD-10-CM

## 2021-11-06 LAB — COMPREHENSIVE METABOLIC PANEL
ALT: 33 U/L (ref 0–44)
AST: 36 U/L (ref 15–41)
Albumin: 2.9 g/dL — ABNORMAL LOW (ref 3.5–5.0)
Alkaline Phosphatase: 85 U/L (ref 38–126)
Anion gap: 12 (ref 5–15)
BUN: 62 mg/dL — ABNORMAL HIGH (ref 8–23)
CO2: 19 mmol/L — ABNORMAL LOW (ref 22–32)
Calcium: 8.1 mg/dL — ABNORMAL LOW (ref 8.9–10.3)
Chloride: 107 mmol/L (ref 98–111)
Creatinine, Ser: 1.46 mg/dL — ABNORMAL HIGH (ref 0.44–1.00)
GFR, Estimated: 39 mL/min — ABNORMAL LOW (ref >60)
Glucose, Bld: 169 mg/dL — ABNORMAL HIGH (ref 70–99)
Potassium: 3.8 mmol/L (ref 3.5–5.1)
Sodium: 138 mmol/L (ref 135–145)
Total Bilirubin: 1.1 mg/dL (ref 0.3–1.2)
Total Protein: 5.6 g/dL — ABNORMAL LOW (ref 6.5–8.1)

## 2021-11-06 LAB — ECHOCARDIOGRAM COMPLETE
AR max vel: 2.88 cm2
AV Area VTI: 3.4 cm2
AV Area mean vel: 2.7 cm2
AV Mean grad: 2 mmHg
AV Peak grad: 3.5 mmHg
Ao pk vel: 0.93 m/s
Area-P 1/2: 4.26 cm2
Height: 60 in
S' Lateral: 2 cm
Weight: 2786.61 oz

## 2021-11-06 LAB — CBC
HCT: 29.2 % — ABNORMAL LOW (ref 36.0–46.0)
Hemoglobin: 10.2 g/dL — ABNORMAL LOW (ref 12.0–15.0)
MCH: 30.2 pg (ref 26.0–34.0)
MCHC: 34.9 g/dL (ref 30.0–36.0)
MCV: 86.4 fL (ref 80.0–100.0)
Platelets: 62 10*3/uL — ABNORMAL LOW (ref 150–400)
RBC: 3.38 MIL/uL — ABNORMAL LOW (ref 3.87–5.11)
RDW: 13.8 % (ref 11.5–15.5)
WBC: 13.5 10*3/uL — ABNORMAL HIGH (ref 4.0–10.5)
nRBC: 0 % (ref 0.0–0.2)

## 2021-11-06 LAB — GLUCOSE, CAPILLARY
Glucose-Capillary: 152 mg/dL — ABNORMAL HIGH (ref 70–99)
Glucose-Capillary: 230 mg/dL — ABNORMAL HIGH (ref 70–99)
Glucose-Capillary: 191 mg/dL — ABNORMAL HIGH (ref 70–99)
Glucose-Capillary: 152 mg/dL — ABNORMAL HIGH (ref 70–99)

## 2021-11-06 LAB — MAGNESIUM: Magnesium: 2.3 mg/dL (ref 1.7–2.4)

## 2021-11-06 MED ORDER — TRAZODONE HCL 50 MG PO TABS
50.0000 mg | ORAL_TABLET | Freq: Every day | ORAL | Status: DC
  Administered 2021-11-06 – 2021-11-07 (×2): 50 mg via ORAL
  Filled 2021-11-06 (×2): qty 1

## 2021-11-06 MED ORDER — SODIUM CHLORIDE 0.9% FLUSH: 5.0000 mL | Freq: Three times a day (TID) | INTRAVENOUS | 1 refills | Status: DC

## 2021-11-06 MED ORDER — HYDROCORTISONE SOD SUC (PF) 100 MG IJ SOLR
50.0000 mg | Freq: Two times a day (BID) | INTRAMUSCULAR | Status: AC
  Administered 2021-11-06 – 2021-11-07 (×2): 50 mg via INTRAVENOUS
  Filled 2021-11-06 (×2): qty 1

## 2021-11-06 NOTE — Progress Notes (Signed)
Physical Therapy Treatment Patient Details Name: Amanda [name] MRN: [id number] DOB: 1956-01-23 Today's Date: 11/06/2021   History of Present Illness Pt. is a 66 y.o. female with acute cholecystitis presenting with septic shock. S/p Cholecstomy 1/13.    PT Comments    Pt received supine in bed agreeable to treatment. Denies abdominal pain/discomfort. Tolerating LE therex well in supine with min VC's for form/technique with good carryover. Pt does require intermittent rest with therex due to deconditioning with HR elevating from mid 90's at rest up to 117 BPM. Pt able to transfer to EoB with mod-I with use of bed rail and stand to RW with supervision cuing for hand placement. Pt progressing ambulation to 84' with RW moving at slow but consistent cadence with two standing rest breaks. Pt returning to room with increased cadence with HR up to 134 BPM and RR at 38 pt endorsing fatigue and need to sit with noted SOB. Pt safe with transferring to Eob with decrease in HR to mid 90's after 1-2 min seated rest with return to resting RR. Pt able to transfer supine mod-I with all needs in reach. D/c recs remain appropriate. Will benefit from [facility] PT due to decreased endurance with OOB mobility.   Recommendations for follow up therapy are one component of a multi-disciplinary discharge planning process, led by the attending physician.  Recommendations may be updated based on patient status, additional functional criteria and insurance authorization.  Follow Up Recommendations  Home health PT     Assistance Recommended at Discharge Frequent or constant Supervision/Assistance  Patient can return home with the following A little help with bathing/dressing/bathroom;Assistance with cooking/housework;Assist for transportation;Help with stairs or ramp for entrance;A little help with walking and/or transfers   Equipment Recommendations  Rolling walker (2 wheels);BSC/3in1    Recommendations for Other Services        Precautions / Restrictions Precautions Precautions: Fall Restrictions Weight Bearing Restrictions: No     Mobility  Bed Mobility Overal bed mobility: Modified Independent Bed Mobility: Supine to Sit, Sit to Supine     Supine to sit: Modified independent (Device/Increase time) Sit to supine: Modified independent (Device/Increase time)   General bed mobility comments: Supine to sit to EOB with use of bedrails Patient Response: Cooperative  Transfers Overall transfer level: Needs assistance Equipment used: Rolling walker (2 wheels) Transfers: Sit to/from Stand Sit to Stand: Supervision           General transfer comment: cuing for hand placement    Ambulation/Gait Ambulation/Gait assistance: Supervision Gait Distance (Feet): 70 Feet Assistive device: Rolling walker (2 wheels) Gait Pattern/deviations: Step-to pattern, Decreased stride length       General Gait Details: cautious, required use of RW due to weakness   Stairs             Wheelchair Mobility    Modified Rankin (Stroke Patients Only)       Balance Overall balance assessment: Needs assistance Sitting-balance support: Feet unsupported, Bilateral upper extremity supported Sitting balance-Leahy Scale: Fair     Standing balance support: During functional activity, Reliant on assistive device for balance Standing balance-Leahy Scale: Fair                              Cognition Arousal/Alertness: Awake/alert Behavior During Therapy: WFL for tasks assessed/performed Overall Cognitive Status: Within Functional Limits for tasks assessed  Exercises General Exercises - Lower Extremity Ankle Circles/Pumps: AROM, Supine, Both, 15 reps, Strengthening Heel Slides: AROM, Supine, Strengthening, Both, 10 reps Hip ABduction/ADduction: AROM, Supine, Strengthening, Both, 10 reps Straight Leg Raises: AROM, Supine, Strengthening, Both,  10 reps    General Comments General comments (skin integrity, edema, etc.): HR up to 134 BPM with mobility and RR of 38      Pertinent Vitals/Pain Pain Assessment Pain Assessment: No/denies pain    Home Living                          Prior Function            PT Goals (current goals can now be found in the care plan section) Acute Rehab PT Goals Patient Stated Goal: to return home PT Goal Formulation: With patient Time For Goal Achievement: 11/19/21 Potential to Achieve Goals: Good Progress towards PT goals: Progressing toward goals    Frequency    Min 2X/week      PT Plan Current plan remains appropriate    Co-evaluation              AM-PAC PT "6 Clicks" Mobility   Outcome Measure  Help needed turning from your back to your side while in a flat bed without using bedrails?: A Little Help needed moving from lying on your back to sitting on the side of a flat bed without using bedrails?: A Little Help needed moving to and from a bed to a chair (including a wheelchair)?: A Little Help needed standing up from a chair using your arms (e.g., wheelchair or bedside chair)?: A Little Help needed to walk in hospital room?: A Little Help needed climbing 3-5 steps with a railing? : A Little 6 Click Score: 18    End of Session Equipment Utilized During Treatment: Gait belt Activity Tolerance: Patient tolerated treatment well;Patient limited by fatigue Patient left: in bed;with call bell/phone within reach Nurse Communication: Mobility status PT Visit Diagnosis: Other abnormalities of gait and mobility (R26.89);Muscle weakness (generalized) (M62.81)     Time: 8338-2505 PT Time Calculation (min) (ACUTE ONLY): 19 min  Charges:  $Therapeutic Exercise: 8-22 mins                    Delphia Grates. Fairly IV, PT, DPT Physical Therapist- [place]  [facility]  11/06/2021, 3:56 PM

## 2021-11-06 NOTE — Care Management Important Message (Signed)
Important Message  Patient Details  Name: Amanda [name] MRN: [id number] Date of Birth: [birth year]   Medicare Important Message Given:  N/A - LOS <3 / Initial given by admissions     Johnell Comings 11/06/2021, 8:40 AM

## 2021-11-06 NOTE — Progress Notes (Signed)
Referring Physician(s): Henrene Dodge, MD  Supervising Physician: Pernell Dupre  Patient Status:  [facility] - In-pt  Reason for Visit: Acute calculus cholecystitis s/p successful percutaneous cholecystostomy tube placement 11/02/21 by IR  Subjective: Patient states she is feeling better she denies any abdominal pain. She denies any issues with drain site.  Allergies: Tramadol and Penicillins  Medications: Prior to Admission medications   Medication Sig Start Date End Date Taking? Authorizing Provider  benazepril (LOTENSIN) 40 MG tablet Take 1 tablet by mouth once daily 09/26/21   Cannady, [name], NP  carvedilol (COREG) 25 MG tablet TAKE 1 TABLET BY MOUTH TWICE DAILY WITH MEALS 09/26/21   Cannady, Corrie Dandy T, NP  aspirin 81 MG EC tablet Take 81 mg by mouth daily. Swallow whole.    [provider]  Calcium Carb-Cholecalciferol (CALCIUM 600+D3) 600-200 MG-UNIT TABS Take 1 tablet by mouth daily.    [provider]  hydrochlorothiazide (HYDRODIURIL) 25 MG tablet Take 1 tablet by mouth once daily 10/08/21   Vigg, [name], MD  lovastatin (MEVACOR) 20 MG tablet TAKE 2 TABLETS BY MOUTH AT BEDTIME 10/16/21   Cannady, [name], NP  meloxicam (MOBIC) 15 MG tablet Take 1 tablet (15 mg total) by mouth daily. Only to use prn Patient not taking: Reported on 11/03/2021 05/03/21   Loura Pardon, MD  Multiple Vitamin (MULTIVITAMIN) tablet Take 1 tablet by mouth daily.    [provider]  omeprazole (PRILOSEC) 20 MG capsule Take 20 mg by mouth daily.    [provider]  sodium chloride flush (NS) 0.9 % SOLN 5 mLs by Intracatheter route every 8 (eight) hours. 11/06/21 01/05/22  Donovan Kail, PA-C  SYNJARDY XR 12.02-999 MG TB24 Take 1 tablet by mouth twice daily 09/20/21   Vigg, [name], MD  traZODone (DESYREL) 50 MG tablet TAKE 1 TABLET BY MOUTH AT BEDTIME 09/20/21   Vigg, [name], MD  TRULICITY 3 MG/0.5ML SOPN INJECT 1 SYRINGE SUBCUTANEOUSLY ONCE A WEEK 10/16/21   Vigg,  [name], MD   Vital Signs: BP (!) 141/71 (BP Location: Right Arm)    Pulse 97    Temp 97.7 F (36.5 C) (Oral)    Resp 18    Ht 5' (1.524 m)    Wt 174 lb 2.6 oz (79 kg)    SpO2 96%    BMI 34.01 kg/m   Physical Exam General: A&O, NAD, just finished walking the halls Abd: Soft, NT, ND, RUQ drain dressing C/D/I-NT, small amount of bilious output in gravity bag  Output by Drain (mL) 11/04/21 0701 - 11/04/21 1900 11/04/21 1901 - 11/05/21 0700 11/05/21 0701 - 11/05/21 1900 11/05/21 1901 - 11/06/21 0700 11/06/21 0701 - 11/06/21 1600  Biliary Tube Cook slip-coat 12 Fr. RUQ   5 20     Imaging: ECHOCARDIOGRAM COMPLETE  Result Date: 11/06/2021    ECHOCARDIOGRAM REPORT   Patient Name:   Yazmyne A Beitler Date of Exam: 11/06/2021 Medical Rec #:  [id number]   Height:       60.0 in Accession #:    [id number]  Weight:       310.8 lb Date of Birth:  10/21/1956   BSA:          2.251 m Patient Age:    66 years    BP:           141/71 mmHg Patient Gender: F           HR:  97 bpm. Exam Location:  [facility] Procedure: 2D Echo, Cardiac Doppler and Color Doppler Indications:     Atrial Fibrillation I48.91  History:         Patient has no prior history of Echocardiogram examinations.                  Risk Factors:Hypertension, Diabetes and Dyslipidemia.  Sonographer:     Cristela Blue Referring Phys:  [id number] JENNIFER CHOI Diagnosing Phys: Arnoldo Hooker MD  Sonographer Comments: Suboptimal apical window. IMPRESSIONS  1. Left ventricular ejection fraction, by estimation, is 60 to 65%. The left ventricle has normal function. The left ventricle has no regional wall motion abnormalities. Left ventricular diastolic parameters were normal.  2. Right ventricular systolic function is normal. The right ventricular size is normal.  3. Left atrial size was mildly dilated.  4. The mitral valve is normal in structure. Mild mitral valve regurgitation.  5. The aortic valve is normal in structure. Aortic valve regurgitation is trivial. FINDINGS   Left Ventricle: Left ventricular ejection fraction, by estimation, is 60 to 65%. The left ventricle has normal function. The left ventricle has no regional wall motion abnormalities. The left ventricular internal cavity size was normal in size. There is  no left ventricular hypertrophy. Left ventricular diastolic parameters were normal. Right Ventricle: The right ventricular size is normal. No increase in right ventricular wall thickness. Right ventricular systolic function is normal. Left Atrium: Left atrial size was mildly dilated. Right Atrium: Right atrial size was normal in size. Pericardium: There is no evidence of pericardial effusion. Mitral Valve: The mitral valve is normal in structure. Mild mitral valve regurgitation. Tricuspid Valve: The tricuspid valve is normal in structure. Tricuspid valve regurgitation is not demonstrated. Aortic Valve: The aortic valve is normal in structure. Aortic valve regurgitation is trivial. Aortic valve mean gradient measures 2.0 mmHg. Aortic valve peak gradient measures 3.5 mmHg. Aortic valve area, by VTI measures 3.40 cm. Pulmonic Valve: The pulmonic valve was normal in structure. Pulmonic valve regurgitation is not visualized. Aorta: The aortic root and ascending aorta are structurally normal, with no evidence of dilitation. IAS/Shunts: No atrial level shunt detected by color flow Doppler.  LEFT VENTRICLE PLAX 2D LVIDd:         3.50 cm LVIDs:         2.00 cm LV PW:         1.20 cm LV IVS:        0.65 cm LVOT diam:     2.00 cm LV SV:         50 LV SV Index:   22 LVOT Area:     3.14 cm  RIGHT VENTRICLE RV Basal diam:  3.60 cm RV S prime:     7.18 cm/s TAPSE (M-mode): 1.0 cm LEFT ATRIUM             Index        RIGHT ATRIUM           Index LA diam:        4.40 cm 1.95 cm/m   RA Area:     18.40 cm LA Vol (A2C):   76.8 ml 34.11 ml/m  RA Volume:   51.60 ml  22.92 ml/m LA Vol (A4C):   65.6 ml 29.14 ml/m LA Biplane Vol: 71.2 ml 31.63 ml/m  AORTIC VALVE                     PULMONIC VALVE AV Area (Vmax):  2.88 cm     PV Vmax:        0.62 m/s AV Area (Vmean):   2.70 cm     PV Vmean:       39.850 cm/s AV Area (VTI):     3.40 cm     PV VTI:         0.098 m AV Vmax:           93.30 cm/s   PV Peak grad:   1.5 mmHg AV Vmean:          68.100 cm/s  PV Mean grad:   1.0 mmHg AV VTI:            0.148 m      RVOT Peak grad: 2 mmHg AV Peak Grad:      3.5 mmHg AV Mean Grad:      2.0 mmHg LVOT Vmax:         85.50 cm/s LVOT Vmean:        58.500 cm/s LVOT VTI:          0.160 m LVOT/AV VTI ratio: 1.08  AORTA Ao Root diam: 2.50 cm MITRAL VALVE                TRICUSPID VALVE MV Area (PHT): 4.26 cm     TR Peak grad:   12.2 mmHg MV Decel Time: 178 msec     TR Vmax:        175.00 cm/s MV E velocity: 113.00 cm/s                             SHUNTS                             Systemic VTI:  0.16 m                             Systemic Diam: 2.00 cm                             Pulmonic VTI:  0.093 m Arnoldo Hooker MD Electronically signed by Arnoldo Hooker MD Signature Date/Time: 11/06/2021/1:26:00 PM    Final     Labs:  CBC: Recent Labs    11/03/21 0806 11/04/21 0614 11/05/21 0445 11/06/21 0522  WBC 18.0* 8.4 8.2 13.5*  HGB 10.6* 9.4* 8.9* 10.2*  HCT 30.2* 27.1* 25.2* 29.2*  PLT 111* 62* 59* 62*    COAGS: Recent Labs    11/02/21 0943  INR 1.2  APTT 35    BMP: Recent Labs    11/04/21 0614 11/04/21 1837 11/05/21 0445 11/06/21 0522  NA 134* 135 135 138  K 3.3* 3.1* 4.1 3.8  CL 103 105 106 107  CO2 20* 21* 21* 19*  GLUCOSE 97 153* 135* 169*  BUN 59* 59* 63* 62*  CALCIUM 7.5* 7.5* 7.8* 8.1*  CREATININE 1.79* 1.67* 1.62* 1.46*  GFRNONAA 31* 34* 35* 39*    LIVER FUNCTION TESTS: Recent Labs    11/04/21 0614 11/04/21 1837 11/05/21 0445 11/06/21 0522  BILITOT 0.6 1.4* 1.3* 1.1  AST 62* 52* 53* 36  ALT 41 38 39 33  ALKPHOS 79 86 74 85  PROT 4.7* 5.1* 5.4* 5.6*  ALBUMIN 1.9* 1.9* 2.5* 2.9*    Assessment and Plan: Acute calculus cholecystitis s/p  successful  percutaneous cholecystostomy tube placement 11/02/21 by IR, clinically improving- decreased abdominal pain and site without complication. GB Cx GNR- E. Coli, Wbc initially trended down but now slight rise- continue to monitor, of note antibiotics changed yesterday based on Cx, afebrile. Continue to flush as ordered and monitor daily output.  Per surgical notes patient will f/u with surgery in 2-3 weeks to discuss cholecystectomy.   Cholecystostomy tube will need to stay in place 6-8 weeks OR until interval cholecystectomy. Follow-up with surgery, if deemed a poor surgical candidate at that time, IR can perform cholecystostomy tube exchanges every 6-8 weeks. Drain catheter has retained suture within catheter, prior to removal of drain cut off the hub to release the retention suture forming the pigtail.   Electronically Signed: Berneta Levins, PA-C 11/06/2021, 3:48 PM   I spent a total of 15 Minutes at the the patient's bedside AND on the patient's hospital floor or unit, greater than 50% of which was counseling/coordinating care for acute cholecystitis.

## 2021-11-06 NOTE — Progress Notes (Signed)
Occupational Therapy Treatment Patient Details Name: Amanda [name] MRN: [id number] DOB: Jan 31, 1956 Today's Date: 11/06/2021   History of present illness Pt. is a 66 y.o. female with acute cholecystitis presenting with septic shock. S/p Cholecstomy 1/13.   OT comments  Pt./caregiver education was provided about energy conservation, and work simplification strategies for ADLs, and home management, A/E use for LE ADLs, and pursed lip breathing techniques. Reviewed pt.'s daily routines, and assisted with problem solving through anticipated home needs in preparation with transitioning from hospital to home at discharge. Pt. Has supportive family to assist as needed. Pt.'s SO2 is 98% on RA,HR 98-115, and RR 16-31. Pt. Continues to benefit from OT services for ADL training, A/E training, and pt. education about energy conservation/work simplification, home modification, and DME. Pt. Plans to return home upon discharge, with family assistance as needed.     Recommendations for follow up therapy are one component of a multi-disciplinary discharge planning process, led by the attending physician.  Recommendations may be updated based on patient status, additional functional criteria and insurance authorization.    Follow Up Recommendations  Home health OT    Assistance Recommended at Discharge Intermittent Supervision/Assistance  Patient can return home with the following  A little help with walking and/or transfers;A little help with bathing/dressing/bathroom;Assistance with cooking/housework;Assist for transportation;Help with stairs or ramp for entrance   Equipment Recommendations    Shower seat   Recommendations for Other Services      Precautions / Restrictions Precautions Precautions: Fall Restrictions Weight Bearing Restrictions: No       Mobility Bed Mobility Overal bed mobility: Modified Independent             General bed mobility comments: Supine to sit to EOB with use of  bedrails    Transfers Overall transfer level: Needs assistance Equipment used: Rolling walker (2 wheels) Transfers: Sit to/from Stand Sit to Stand: Supervision           General transfer comment: Supervision sidestepping towards the [facility]     Balance                                           ADL either performed or assessed with clinical judgement   ADL                                         General ADL Comments: SB-minA LE ADLs.    Extremity/Trunk Assessment Upper Extremity Assessment Upper Extremity Assessment: Generalized weakness            Vision Patient Visual Report: No change from baseline     Perception     Praxis      Cognition Arousal/Alertness: Awake/alert   Overall Cognitive Status: Within Functional Limits for tasks assessed                                          Exercises      Shoulder Instructions       General Comments      Pertinent Vitals/ Pain       Pain Assessment Pain Assessment: 0-10 Pain Score: 4  Pain Location: Abdomen Pain Descriptors / Indicators: Aching Pain Intervention(s): Limited  activity within patient's tolerance, Monitored during session, Repositioned  Home Living                                          Prior Functioning/Environment              Frequency  Min 2X/week        Progress Toward Goals  OT Goals(current goals can now be found in the care plan section)  Progress towards OT goals: Progressing toward goals  Acute Rehab OT Goals Patient Stated Goal: To return home OT Goal Formulation: With patient Time For Goal Achievement: 11/19/21 Potential to Achieve Goals: Good  Plan      Co-evaluation                 AM-PAC OT "6 Clicks" Daily Activity     Outcome Measure   Help from another person eating meals?: None Help from another person taking care of personal grooming?: None Help from another person  toileting, which includes using toliet, bedpan, or urinal?: A Little Help from another person bathing (including washing, rinsing, drying)?: A Little Help from another person to put on and taking off regular upper body clothing?: None Help from another person to put on and taking off regular lower body clothing?: A Little 6 Click Score: 21    End of Session Equipment Utilized During Treatment: Rolling walker (2 wheels)  OT Visit Diagnosis: Other abnormalities of gait and mobility (R26.89);Muscle weakness (generalized) (M62.81)   Activity Tolerance Patient tolerated treatment well   Patient Left in bed;with call bell/phone within reach   Nurse Communication          Time: 2035-5974 OT Time Calculation (min): 30 min  Charges: OT General Charges $OT Visit: 1 Visit OT Treatments $Self Care/Home Management : 23-37 mins  Olegario Messier, MS, OTR/L   Olegario Messier 11/06/2021, 2:56 PM

## 2021-11-06 NOTE — Progress Notes (Signed)
*  PRELIMINARY RESULTS* Echocardiogram 2D Echocardiogram has been performed.  Cristela Blue 11/06/2021, 1:05 PM

## 2021-11-06 NOTE — Progress Notes (Signed)
PROGRESS NOTE    Amanda [name]  [id number] DOB: 09/11/1956 DOA: 11/02/2021 PCP: Charlynne Cousins, MD     Brief Narrative:  Amanda [name] is a 66 year old female with past medical history significant for HTN, HLD, GERD, DM who was admitted to ICU with septic shock secondary to acute cholecystitis.  Patient had percutaneous cholecystostomy tube placed by IR on 1/13.  She required Levophed due to septic shock.  Blood cultures grew Enterobacter, E. coli.  [facility].  Patient went into new onset A. fib on afternoon of 1/16, rate was well controlled and patient was started on oral Cardizem.  New events last 24 hours / Subjective: Patient remains in A. fib, rate is 80s this morning.  She has no complaints of chest pain, pressure, abdominal pain, nausea or vomiting.    Assessment & Plan:   Principal Problem:   Septic shock ([place]) Active Problems:   CKD (chronic kidney disease) stage 3, GFR 30-59 ml/min (HCC)   Diabetes mellitus without complication (HCC)   Cholecystitis   Bacteremia due to Gram-negative bacteria   AF (paroxysmal atrial fibrillation) (HCC)   Pancytopenia (HCC)   Demand ischemia (HCC)   AKI (acute kidney injury) ([place])   Septic shock secondary to acute cholecystitis, Enterobacter and E. coli bacteremia, UTI  -Sepsis present on admission -Now off vasopressor.  Wean off Solu-Cortef and stop midodrine -Status post percutaneous cholecystostomy tube 1/13 by IR which will remain in place for 6-8 weeks -Appreciate general surgery, follow-up as outpatient in 2-3 weeks  -Continue Keflex/Flagyl  New onset A. fib -Cardiology consulted, Dr. Nehemiah Massed aware via secure chat  -Echocardiogram is pending -Continue oral Cardizem -Holding off on anticoagulation due to thrombocytopenia  Pancytopenia -Likely secondary to sepsis. Now WBC increasing but patient has been on steroids -Stop subq hep  -Monitor  Demand ischemia -Secondary to sepsis    AKI on CKD stage 3a -Baseline Cr 1 -Improving  -Tolerating PO   Diabetes mellitus, well controlled  -A1c 6.7 in Nov 2022  -SSI    DVT prophylaxis:  SCDs Start: 11/02/21 1409  Code Status: Full code Family Communication: Sister at bedside Disposition Plan:  Status is: Inpatient  Remains inpatient appropriate because: New onset A Fib, cardiology consulted, Possible discharge home 1/18 if stable     Antimicrobials:  Anti-infectives (From admission, onward)    Start     Dose/Rate Route Frequency Ordered Stop   11/06/21 0800  cephALEXin (KEFLEX) capsule 500 mg        500 mg Oral 3 times daily with meals & bedtime 11/05/21 1519     11/05/21 2200  metroNIDAZOLE (FLAGYL) tablet 500 mg        500 mg Oral 2 times daily 11/05/21 1519     11/03/21 1500  anidulafungin (ERAXIS) 100 mg in sodium chloride 0.9 % 100 mL IVPB  Status:  Discontinued        100 mg 78 mL/hr over 100 Minutes Intravenous Every 24 hours 11/02/21 1401 11/04/21 0857   11/03/21 1000  cefTRIAXone (ROCEPHIN) 2 g in sodium chloride 0.9 % 100 mL IVPB  Status:  Discontinued        2 g 200 mL/hr over 30 Minutes Intravenous Every 24 hours 11/02/21 1413 11/05/21 1519   11/02/21 2200  metroNIDAZOLE (FLAGYL) IVPB 500 mg  Status:  Discontinued        500 mg 100 mL/hr over 60 Minutes Intravenous Every 12 hours 11/02/21 1413 11/04/21 0857  11/02/21 1500  anidulafungin (ERAXIS) 200 mg in sodium chloride 0.9 % 200 mL IVPB        200 mg 78 mL/hr over 200 Minutes Intravenous  Once 11/02/21 1401 11/03/21 0828   11/02/21 1045  ceFEPIme (MAXIPIME) 2 g in sodium chloride 0.9 % 100 mL IVPB        2 g 200 mL/hr over 30 Minutes Intravenous  Once 11/02/21 1031 11/02/21 1159   11/02/21 1045  metroNIDAZOLE (FLAGYL) IVPB 500 mg        500 mg 100 mL/hr over 60 Minutes Intravenous  Once 11/02/21 1031 11/02/21 1305        Objective: Vitals:   11/05/21 1702 11/05/21 1951 11/06/21 0508 11/06/21 0831  BP: 124/81 (!) 145/81 (!) 148/82  (!) 141/71  Pulse: (!) 110 (!) 105 94 97  Resp: 19  18 18   Temp: 98 F (36.7 C) 97.9 F (36.6 C) 98.2 F (36.8 C) 97.7 F (36.5 C)  TempSrc: Oral   Oral  SpO2: 97% 96% 96% 96%  Weight:      Height:        Intake/Output Summary (Last 24 hours) at 11/06/2021 1209 Last data filed at 11/06/2021 0900 Gross per 24 hour  Intake 355 ml  Output 20 ml  Net 335 ml    Filed Weights   11/03/21 0500 11/04/21 0500 11/05/21 0455  Weight: 79.5 kg 79.7 kg 79 kg    Examination:  General exam: Appears calm and comfortable  Respiratory system: Clear to auscultation. Respiratory effort normal. No respiratory distress. No conversational dyspnea.  Cardiovascular system: S1 & S2 heard, Irreg rhythm rate 80s. No murmurs. No pedal edema. Gastrointestinal system: Abdomen is nondistended, soft and nontender. Normal bowel sounds heard. +Biliary drain in place  Central nervous system: Alert and oriented. No focal neurological deficits. Speech clear.  Extremities: Symmetric in appearance  Skin: No rashes, lesions or ulcers on exposed skin  Psychiatry: Judgement and insight appear normal. Mood & affect appropriate.   Data Reviewed: I have personally reviewed following labs and imaging studies  CBC: Recent Labs  Lab 11/02/21 0943 11/03/21 0806 11/04/21 0614 11/05/21 0445 11/06/21 0522  WBC 1.3*   1.3* 18.0* 8.4 8.2 13.5*  NEUTROABS 0.9*  --   --   --   --   HGB 11.7*   11.8* 10.6* 9.4* 8.9* 10.2*  HCT 34.7*   34.7* 30.2* 27.1* 25.2* 29.2*  MCV 89.0   89.4 85.3 86.3 86.0 86.4  PLT 129*   129* 111* 62* 59* 62*    Basic Metabolic Panel: Recent Labs  Lab 11/03/21 0806 11/03/21 1844 11/04/21 0614 11/04/21 1837 11/05/21 0445 11/06/21 0522  NA 131*  --  134* 135 135 138  K 3.9  --  3.3* 3.1* 4.1 3.8  CL 99  --  103 105 106 107  CO2 21*  --  20* 21* 21* 19*  GLUCOSE 136*  --  97 153* 135* 169*  BUN 58*  --  59* 59* 63* 62*  CREATININE 2.05*  --  1.79* 1.67* 1.62* 1.46*  CALCIUM 7.4*  --   7.5* 7.5* 7.8* 8.1*  MG 1.2* 1.8 1.9 1.9 2.5* 2.3  PHOS 2.9  --  2.4* 3.0 2.8  --     GFR: Estimated Creatinine Clearance: 35.2 mL/min (A) (by C-G formula based on SCr of 1.46 mg/dL (H)). Liver Function Tests: Recent Labs  Lab 11/03/21 0806 11/04/21 0614 11/04/21 1837 11/05/21 0445 11/06/21 0522  AST 84* 62* 52*  53* 36  ALT 51* 41 38 39 33  ALKPHOS 94 79 86 74 85  BILITOT 1.5* 0.6 1.4* 1.3* 1.1  PROT 5.3* 4.7* 5.1* 5.4* 5.6*  ALBUMIN 2.1* 1.9* 1.9* 2.5* 2.9*    Recent Labs  Lab 11/02/21 0943  LIPASE 21    No results for input(s): AMMONIA in the last 168 hours. Coagulation Profile: Recent Labs  Lab 11/02/21 0943  INR 1.2    Cardiac Enzymes: No results for input(s): CKTOTAL, CKMB, CKMBINDEX, TROPONINI in the last 168 hours. BNP (last 3 results) No results for input(s): PROBNP in the last 8760 hours. HbA1C: Recent Labs    11/05/21 0445  HGBA1C 6.9*   CBG: Recent Labs  Lab 11/05/21 1532 11/05/21 1704 11/05/21 2110 11/06/21 0753 11/06/21 1138  GLUCAP 178* 168* 167* 152* 230*    Lipid Profile: No results for input(s): CHOL, HDL, LDLCALC, TRIG, CHOLHDL, LDLDIRECT in the last 72 hours. Thyroid Function Tests: No results for input(s): TSH, T4TOTAL, FREET4, T3FREE, THYROIDAB in the last 72 hours. Anemia Panel: No results for input(s): VITAMINB12, FOLATE, FERRITIN, TIBC, IRON, RETICCTPCT in the last 72 hours. Sepsis Labs: Recent Labs  Lab 11/02/21 0943 11/02/21 1121 11/02/21 1656 11/03/21 0011 11/03/21 0806 11/04/21 0614  PROCALCITON 143.43  --   --   --  120.77 72.87  LATICACIDVEN 5.9* 4.2* 2.8* 2.2*  --   --      Recent Results (from the past 240 hour(s))  Resp Panel by RT-PCR (Flu A&B, Covid) Nasopharyngeal Swab     Status: None   Collection Time: 11/02/21  9:43 AM   Specimen: Nasopharyngeal Swab; Nasopharyngeal(NP) swabs in vial transport medium  Result Value Ref Range Status   SARS Coronavirus 2 by RT PCR NEGATIVE NEGATIVE Final     Comment: (NOTE) SARS-CoV-2 target nucleic acids are NOT DETECTED.  The SARS-CoV-2 RNA is generally detectable in upper respiratory specimens during the acute phase of infection. The lowest concentration of SARS-CoV-2 viral copies this assay can detect is 138 copies/mL. A negative result does not preclude SARS-Cov-2 infection and should not be used as the sole basis for treatment or other patient management decisions. A negative result may occur with  improper specimen collection/handling, submission of specimen other than nasopharyngeal swab, presence of viral mutation(s) within the areas targeted by this assay, and inadequate number of viral copies(<138 copies/mL). A negative result must be combined with clinical observations, patient history, and epidemiological information. The expected result is Negative.  Fact Sheet for Patients:  EntrepreneurPulse.com.au  Fact Sheet for Healthcare Providers:  IncredibleEmployment.be  This test is no t yet approved or cleared by the [place] FDA and  has been authorized for detection and/or diagnosis of SARS-CoV-2 by FDA under an Emergency Use Authorization (EUA). This EUA will remain  in effect (meaning this test can be used) for the duration of the COVID-19 declaration under Section 564(b)(1) of the Act, 21 U.S.C.section 360bbb-3(b)(1), unless the authorization is terminated  or revoked sooner.       Influenza A by PCR NEGATIVE NEGATIVE Final   Influenza B by PCR NEGATIVE NEGATIVE Final    Comment: (NOTE) The Xpert Xpress SARS-CoV-2/FLU/RSV plus assay is intended as an aid in the diagnosis of influenza from Nasopharyngeal swab specimens and should not be used as a sole basis for treatment. Nasal washings and aspirates are unacceptable for Xpert Xpress SARS-CoV-2/FLU/RSV testing.  Fact Sheet for Patients: EntrepreneurPulse.com.au  Fact Sheet for Healthcare  Providers: IncredibleEmployment.be  This test is not yet approved  or cleared by the [place] and has been authorized for detection and/or diagnosis of SARS-CoV-2 by FDA under an Emergency Use Authorization (EUA). This EUA will remain in effect (meaning this test can be used) for the duration of the COVID-19 declaration under Section 564(b)(1) of the Act, 21 U.S.C. section 360bbb-3(b)(1), unless the authorization is terminated or revoked.  Performed at [facility], [address] [place], [place] [address]   Blood culture (routine x 2)     Status: Abnormal   Collection Time: 11/02/21  9:46 AM   Specimen: BLOOD RIGHT WRIST  Result Value Ref Range Status   Specimen Description   Final    BLOOD RIGHT WRIST Performed at [facility], [address], [place], [place] [address]    Special Requests   Final    BOTTLES DRAWN AEROBIC AND ANAEROBIC Blood Culture adequate volume Performed at [facility], [address] [place], [place] [address]    Culture  Setup Time   Final    GRAM NEGATIVE RODS IN BOTH AEROBIC AND ANAEROBIC BOTTLES CRITICAL VALUE NOTED.  VALUE IS CONSISTENT WITH PREVIOUSLY REPORTED AND CALLED VALUE. Performed at [facility], [address] [place], [place] [address]    Culture (A)  Final    ESCHERICHIA COLI SUSCEPTIBILITIES PERFORMED ON PREVIOUS CULTURE WITHIN THE LAST 5 DAYS. Performed at [facility], [place] [address] [place], [place] [address]    Report Status 11/05/2021 FINAL  Final  Blood culture (routine x 2)     Status: Abnormal   Collection Time: 11/02/21  9:46 AM   Specimen: BLOOD LEFT ARM  Result Value Ref Range Status   Specimen Description   Final    BLOOD LEFT ARM Performed at [facility], [address] [place], [place] [address]    Special Requests   Final    BOTTLES DRAWN AEROBIC AND ANAEROBIC Blood Culture adequate volume Performed at [facility],  [address] [place], [place] [address]    Culture  Setup Time   Final    Organism ID to follow GRAM NEGATIVE RODS IN BOTH AEROBIC AND ANAEROBIC BOTTLES CRITICAL RESULT CALLED TO, READ BACK BY AND VERIFIED WITH: [place] ON 11/02/21 BY SS Performed at [facility], [address] [place], [place] [address]    Culture ESCHERICHIA COLI (A)  Final   Report Status 11/05/2021 FINAL  Final   Organism ID, Bacteria ESCHERICHIA COLI  Final      Susceptibility   Escherichia coli - MIC*    AMPICILLIN <=2 SENSITIVE Sensitive     CEFAZOLIN <=4 SENSITIVE Sensitive     CEFEPIME <=0.12 SENSITIVE Sensitive     CEFTAZIDIME <=1 SENSITIVE Sensitive     CEFTRIAXONE <=0.25 SENSITIVE Sensitive     CIPROFLOXACIN <=0.25 SENSITIVE Sensitive     GENTAMICIN <=1 SENSITIVE Sensitive     IMIPENEM <=0.25 SENSITIVE Sensitive     TRIMETH/SULFA <=20 SENSITIVE Sensitive     AMPICILLIN/SULBACTAM <=2 SENSITIVE Sensitive     PIP/TAZO <=4 SENSITIVE Sensitive     * ESCHERICHIA COLI  Blood Culture ID Panel (Reflexed)     Status: Abnormal   Collection Time: 11/02/21  9:46 AM  Result Value Ref Range Status   Enterococcus faecalis NOT DETECTED NOT DETECTED Final   Enterococcus Faecium NOT DETECTED NOT DETECTED Final   Listeria monocytogenes NOT DETECTED NOT DETECTED Final   Staphylococcus species NOT DETECTED NOT DETECTED Final   Staphylococcus aureus (BCID)  NOT DETECTED NOT DETECTED Final   Staphylococcus epidermidis NOT DETECTED NOT DETECTED Final   Staphylococcus lugdunensis NOT DETECTED NOT DETECTED Final   Streptococcus species NOT DETECTED NOT DETECTED Final   Streptococcus agalactiae NOT DETECTED NOT DETECTED Final   Streptococcus pneumoniae NOT DETECTED NOT DETECTED Final   Streptococcus pyogenes NOT DETECTED NOT DETECTED Final   A.calcoaceticus-baumannii NOT DETECTED NOT DETECTED Final   Bacteroides fragilis NOT DETECTED NOT DETECTED Final   Enterobacterales DETECTED (A) NOT DETECTED  Final    Comment: Enterobacterales represent a large order of gram negative bacteria, not a single organism. CRITICAL RESULT CALLED TO, READ BACK BY AND VERIFIED WITH: [place] ON 11/02/21 BY SS    Enterobacter cloacae complex NOT DETECTED NOT DETECTED Final   Escherichia coli DETECTED (A) NOT DETECTED Final    Comment: CRITICAL RESULT CALLED TO, READ BACK BY AND VERIFIED WITH: [place] ON 11/02/21 BY SS    Klebsiella aerogenes NOT DETECTED NOT DETECTED Final   Klebsiella oxytoca NOT DETECTED NOT DETECTED Final   Klebsiella pneumoniae NOT DETECTED NOT DETECTED Final   Proteus species NOT DETECTED NOT DETECTED Final   Salmonella species NOT DETECTED NOT DETECTED Final   Serratia marcescens NOT DETECTED NOT DETECTED Final   Haemophilus influenzae NOT DETECTED NOT DETECTED Final   Neisseria meningitidis NOT DETECTED NOT DETECTED Final   Pseudomonas aeruginosa NOT DETECTED NOT DETECTED Final   Stenotrophomonas maltophilia NOT DETECTED NOT DETECTED Final   Candida albicans NOT DETECTED NOT DETECTED Final   Candida auris NOT DETECTED NOT DETECTED Final   Candida glabrata NOT DETECTED NOT DETECTED Final   Candida krusei NOT DETECTED NOT DETECTED Final   Candida parapsilosis NOT DETECTED NOT DETECTED Final   Candida tropicalis NOT DETECTED NOT DETECTED Final   Cryptococcus neoformans/gattii NOT DETECTED NOT DETECTED Final   CTX-M ESBL NOT DETECTED NOT DETECTED Final   Carbapenem resistance IMP NOT DETECTED NOT DETECTED Final   Carbapenem resistance KPC NOT DETECTED NOT DETECTED Final   Carbapenem resistance NDM NOT DETECTED NOT DETECTED Final   Carbapenem resist OXA 48 LIKE NOT DETECTED NOT DETECTED Final   Carbapenem resistance VIM NOT DETECTED NOT DETECTED Final    Comment: Performed at [facility], [address] [place], [place] [address]  Aerobic/Anaerobic Culture w Gram Stain (surgical/deep wound)     Status: None (Preliminary result)   Collection  Time: 11/02/21  2:18 PM   Specimen: Gallbladder; Bile  Result Value Ref Range Status   Specimen Description   Final    GALL BLADDER Performed at [facility], [address], [place], [place] [address]    Special Requests   Final    NONE Performed at [facility], [address] [place], [place] [address]    Gram Stain   Final    FEW WBC PRESENT,BOTH PMN AND MONONUCLEAR FEW GRAM NEGATIVE RODS Performed at [facility], [address] [place], [place] [address]    Culture   Final    ABUNDANT ESCHERICHIA COLI NO ANAEROBES ISOLATED; CULTURE IN PROGRESS FOR 5 DAYS    Report Status PENDING  Incomplete   Organism ID, Bacteria ESCHERICHIA COLI  Final      Susceptibility   Escherichia coli - MIC*    AMPICILLIN <=2 SENSITIVE Sensitive     CEFAZOLIN <=4 SENSITIVE Sensitive     CEFEPIME <=0.12 SENSITIVE Sensitive     CEFTAZIDIME <=1 SENSITIVE Sensitive     CEFTRIAXONE <=0.25 SENSITIVE  Sensitive     CIPROFLOXACIN <=0.25 SENSITIVE Sensitive     GENTAMICIN <=1 SENSITIVE Sensitive     IMIPENEM <=0.25 SENSITIVE Sensitive     TRIMETH/SULFA <=20 SENSITIVE Sensitive     AMPICILLIN/SULBACTAM <=2 SENSITIVE Sensitive     PIP/TAZO <=4 SENSITIVE Sensitive     * ABUNDANT ESCHERICHIA COLI  MRSA Next Gen by PCR, Nasal     Status: None   Collection Time: 11/02/21  4:41 PM   Specimen: Nasal Mucosa; Nasal Swab  Result Value Ref Range Status   MRSA by PCR Next Gen NOT DETECTED NOT DETECTED Final    Comment: (NOTE) The GeneXpert MRSA Assay (FDA approved for NASAL specimens only), is one component of a comprehensive MRSA colonization surveillance program. It is not intended to diagnose MRSA infection nor to guide or monitor treatment for MRSA infections. Test performance is not FDA approved in patients less than 71 years old. Performed at [facility], [address] [place], [place] [address]   Urine Culture     Status: Abnormal   Collection Time: 11/02/21   4:56 PM   Specimen: In/Out Cath Urine  Result Value Ref Range Status   Specimen Description   Final    IN/OUT CATH URINE Performed at [facility], [address] [place], [place] [address]    Special Requests   Final    NONE Performed at [facility], [place], [place] [address]    Culture 3,000 COLONIES/mL ESCHERICHIA COLI (A)  Final   Report Status 11/05/2021 FINAL  Final   Organism ID, Bacteria ESCHERICHIA COLI (A)  Final      Susceptibility   Escherichia coli - MIC*    AMPICILLIN <=2 SENSITIVE Sensitive     CEFAZOLIN <=4 SENSITIVE Sensitive     CEFEPIME <=0.12 SENSITIVE Sensitive     CEFTRIAXONE <=0.25 SENSITIVE Sensitive     CIPROFLOXACIN <=0.25 SENSITIVE Sensitive     GENTAMICIN <=1 SENSITIVE Sensitive     IMIPENEM <=0.25 SENSITIVE Sensitive     NITROFURANTOIN <=16 SENSITIVE Sensitive     TRIMETH/SULFA <=20 SENSITIVE Sensitive     AMPICILLIN/SULBACTAM <=2 SENSITIVE Sensitive     PIP/TAZO <=4 SENSITIVE Sensitive     * 3,000 COLONIES/mL ESCHERICHIA COLI       Radiology Studies: No results found.    Scheduled Meds:  cephALEXin  500 mg Oral TID WC & HS   Chlorhexidine Gluconate Cloth  6 each Topical Daily   diltiazem  30 mg Oral Q6H   hydrocortisone sod succinate (SOLU-CORTEF) inj  50 mg Intravenous Q12H   insulin aspart  0-20 Units Subcutaneous TID WC   mouth rinse  15 mL Mouth Rinse BID   metroNIDAZOLE  500 mg Oral BID   sodium chloride flush  5 mL Intracatheter Q8H   Continuous Infusions:     LOS: 4 days     Dessa Phi, DO [facility] 11/06/2021, 12:09 PM   Available via Epic secure chat 7am-7pm After these hours, please refer to coverage provider listed on [email or web address]

## 2021-11-06 NOTE — Consult Note (Addendum)
[facility] Cardiology Consultation Note  Patient ID: Amanda [name], MRN: [id number], DOB/AGE: Nov 02, 1955 66 y.o. Admit date: 11/02/2021   Date of Consult: 11/06/2021 Primary Physician: Charlynne Cousins, MD Primary Cardiologist: none  Chief Complaint:  Chief Complaint  Patient presents with   Emesis   Reason for Consult:New onset Atrial Fibrillation  HPI: 66 y.o. female with past medical history of hypertension, hyperlipidemia, type 2 diabetes currently admitted to the hospital with septic shock secondary to acute cholecystitis, not currently requiring pressors for blood pressure support.  Yesterday afternoon, patient went into new onset atrial fibrillation which was subsequently rate controlled with Cardizem.  Patient currently has adequate heart rate control with telemetry showing atrial fibrillation at a rate of 80-90 bpm.  She is completely asymptomatic with no complaints of shortness of breath, fatigue, weakness, palpitations.   Echocardiogram showed normal left ventricular systolic function with an EF of 60-65%, normal RV systolic function with mildly dilated left atrium.  No significant valvular abnormalities.  Past Medical History:  Diagnosis Date   Diabetes mellitus without complication ([place])    Hyperlipidemia    Hypertension       Surgical History:  Past Surgical History:  Procedure Laterality Date   COLONOSCOPY  2012   IR PERC CHOLECYSTOSTOMY  11/02/2021     Home Meds: Prior to Admission medications   Medication Sig Start Date End Date Taking? Authorizing Provider  benazepril (LOTENSIN) 40 MG tablet Take 1 tablet by mouth once daily 09/26/21   Cannady, [name], NP  carvedilol (COREG) 25 MG tablet TAKE 1 TABLET BY MOUTH TWICE DAILY WITH MEALS 09/26/21   Cannady, Henrine Screws T, NP  aspirin 81 MG EC tablet Take 81 mg by mouth daily. Swallow whole.    [provider]  Calcium Carb-Cholecalciferol (CALCIUM 600+D3) 600-200 MG-UNIT TABS Take 1 tablet by mouth daily.     [provider]  hydrochlorothiazide (HYDRODIURIL) 25 MG tablet Take 1 tablet by mouth once daily 10/08/21   Vigg, [name], MD  lovastatin (MEVACOR) 20 MG tablet TAKE 2 TABLETS BY MOUTH AT BEDTIME 10/16/21   Cannady, [name], NP  meloxicam (MOBIC) 15 MG tablet Take 1 tablet (15 mg total) by mouth daily. Only to use prn Patient not taking: Reported on 11/03/2021 05/03/21   Charlynne Cousins, MD  Multiple Vitamin (MULTIVITAMIN) tablet Take 1 tablet by mouth daily.    [provider]  omeprazole (PRILOSEC) 20 MG capsule Take 20 mg by mouth daily.    [provider]  sodium chloride flush (NS) 0.9 % SOLN 5 mLs by Intracatheter route every 8 (eight) hours. 11/06/21 01/05/22  Tylene Fantasia, PA-C  SYNJARDY XR 12.02-999 MG TB24 Take 1 tablet by mouth twice daily 09/20/21   Vigg, [name], MD  traZODone (DESYREL) 50 MG tablet TAKE 1 TABLET BY MOUTH AT BEDTIME 09/20/21   Vigg, [name], MD  TRULICITY 3 0000000 SOPN INJECT 1 SYRINGE SUBCUTANEOUSLY ONCE A WEEK 10/16/21   Vigg, [name], MD    Inpatient Medications:   cephALEXin  500 mg Oral TID WC & HS   Chlorhexidine Gluconate Cloth  6 each Topical Daily   diltiazem  30 mg Oral Q6H   hydrocortisone sod succinate (SOLU-CORTEF) inj  50 mg Intravenous Q12H   insulin aspart  0-20 Units Subcutaneous TID WC   mouth rinse  15 mL Mouth Rinse BID   metroNIDAZOLE  500 mg Oral BID   sodium chloride flush  5 mL Intracatheter Q8H     Allergies:  Allergies  Allergen Reactions   Tramadol Other (See Comments)    "woozy"   Penicillins Other (See Comments)    Dizziness Tolerated augmentin but does not like taste    Social History   Socioeconomic History   Marital status: Single    Spouse name: Not on file   Number of children: Not on file   Years of education: Not on file   Highest education level: Not on file  Occupational History   Not on file  Tobacco Use   Smoking status: Former    Types: Cigarettes    Quit date:  12/07/1975    Years since quitting: 45.9   Smokeless tobacco: Never  Vaping Use   Vaping Use: Never used  Substance and Sexual Activity   Alcohol use: No   Drug use: No   Sexual activity: Not on file  Other Topics Concern   Not on file  Social History Narrative   Not on file   Social Determinants of Health   Financial Resource Strain: Not on file  Food Insecurity: Not on file  Transportation Needs: Not on file  Physical Activity: Not on file  Stress: Not on file  Social Connections: Not on file  Intimate Partner Violence: Not on file     Family History  Problem Relation Age of Onset   Hypertension Mother    Diabetes Mother    Diabetes Sister    Heart disease Sister    Heart disease Sister    Cancer Niece      Review of Systems Positive for atrial fibrillation with controlled rate Negative for: General:  chills, fever, night sweats or weight changes.  Cardiovascular: PND orthopnea syncope dizziness  Dermatological skin lesions rashes Respiratory: Cough congestion Urologic: Frequent urination urination at night and hematuria Abdominal: negative for nausea, vomiting, diarrhea, bright red blood per rectum, melena, or hematemesis Neurologic: negative for visual changes, and/or hearing changes  All other systems reviewed and are otherwise negative except as noted above.  Labs: No results for input(s): CKTOTAL, CKMB, TROPONINI in the last 72 hours. Lab Results  Component Value Date   WBC 13.5 (H) 11/06/2021   HGB 10.2 (L) 11/06/2021   HCT 29.2 (L) 11/06/2021   MCV 86.4 11/06/2021   PLT 62 (L) 11/06/2021    Recent Labs  Lab 11/06/21 0522  NA 138  K 3.8  CL 107  CO2 19*  BUN 62*  CREATININE 1.46*  CALCIUM 8.1*  PROT 5.6*  BILITOT 1.1  ALKPHOS 85  ALT 33  AST 36  GLUCOSE 169*   Lab Results  Component Value Date   CHOL 142 08/30/2021   HDL 50 08/30/2021   LDLCALC 64 08/30/2021   TRIG 163 (H) 08/30/2021   No results found for:  DDIMER  Radiology/Studies:  CT ABDOMEN PELVIS WO CONTRAST  Result Date: 11/02/2021 CLINICAL DATA:  Nausea, vomiting, diarrhea, acute kidney injury, vomiting and chills for 3 days EXAM: CT ABDOMEN AND PELVIS WITHOUT CONTRAST TECHNIQUE: Multidetector CT imaging of the abdomen and pelvis was performed following the standard protocol without IV contrast. COMPARISON:  None. FINDINGS: Lower chest: Trace left pleural effusion. Coronary artery atherosclerosis. Hepatobiliary: No focal hepatic mass. Distended gallbladder with cholelithiasis. Gallbladder wall thickening. No intrahepatic or extrahepatic biliary ductal dilatation. Pancreas: Unremarkable. No pancreatic ductal dilatation or surrounding inflammatory changes. Spleen: Normal in size without focal abnormality. Adrenals/Urinary Tract: 2.4 cm left adrenal mass measuring 21 Hounsfield units of indeterminate etiology. 2.6 cm right adrenal mass of indeterminate etiology measuring 24 Hounsfield  units. No urolithiasis. Mild left hydroureteronephrosis. Bilateral perinephric fat stranding. Normal bladder. Stomach/Bowel: Stomach is within normal limits. Appendix appears normal. No evidence of bowel wall thickening, distention, or inflammatory changes. Multiple fluid-filled loops of small bowel. Vascular/Lymphatic: Aortic atherosclerosis. No enlarged abdominal or pelvic lymph nodes. Reproductive: Uterus and bilateral adnexa are unremarkable. Other: No abdominal wall hernia or abnormality. No abdominopelvic ascites. Musculoskeletal: No acute osseous abnormality. No aggressive osseous lesion. Degenerative disease with disc height loss and facet arthropathy throughout the thoracolumbar spine. IMPRESSION: 1. Cholelithiasis with gallbladder distension and gallbladder wall thickening concerning for acute cholecystitis. 2. 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 Recommend adrenal washout CT or  chemical shift MRI. JACR 2017 Aug; 14(8):1038-44, JCAT 2016 Mar-Apr; 40(2):194-200, Urol J 2006 Spring; 3(2):71-4. 3.  Aortic Atherosclerosis (ICD10-I70.0). Electronically Signed   By: Kathreen Devoid M.D.   On: 11/02/2021 12:04   DG Chest 1 View  Result Date: 11/02/2021 CLINICAL DATA:  Right-sided central line placement EXAM: CHEST  1 VIEW COMPARISON:  Chest radiograph 11/02/2021 FINDINGS: There is a new right IJ central venous catheter terminating at the cavoatrial junction. The cardiomediastinal silhouette is stable. Lung aeration is unchanged, with no new or worsening focal airspace disease. There is no pleural effusion or pneumothorax There is no acute osseous abnormality. IMPRESSION: New right IJ central venous catheter terminating at the cavoatrial junction. No pneumothorax. Electronically Signed   By: Valetta Mole M.D.   On: 11/02/2021 13:34   CT HEAD WO CONTRAST (5MM)  Result Date: 11/02/2021 CLINICAL DATA:  Vomiting and chills for 3 days.  Head trauma. EXAM: CT HEAD WITHOUT CONTRAST TECHNIQUE: Contiguous axial images were obtained from the base of the skull through the vertex without intravenous contrast. RADIATION DOSE REDUCTION: This exam was performed according to the departmental dose-optimization program which includes automated exposure control, adjustment of the mA and/or kV according to patient size and/or use of iterative reconstruction technique. COMPARISON:  None. FINDINGS: Brain: No evidence of acute infarction, hemorrhage, hydrocephalus, extra-axial collection or mass lesion/mass effect. Vascular: No hyperdense vessel or unexpected calcification. Skull: No osseous abnormality. Sinuses/Orbits: Opacification of the right maxillary sinus. Visualized mastoid sinuses are clear. Visualized orbits demonstrate no focal abnormality. Other: None IMPRESSION: No acute intracranial pathology. Electronically Signed   By: Kathreen Devoid M.D.   On: 11/02/2021 12:07   IR Perc Cholecystostomy  Result  Date: 11/02/2021 INDICATION: Acute cholecystitis with sepsis EXAM: 1. Ultrasound-guided puncture of the gallbladder via a subcostal transhepatic approach 2. Cholecystogram 3. Placement of a percutaneous cholecystostomy tube using fluoroscopic guidance MEDICATIONS: Patient received IV antibiotics per sepsis protocol prior to arrival to the IR suite ANESTHESIA/SEDATION: Moderate (conscious) sedation was employed during this procedure. A total of Versed 1 mg and Fentanyl 50 mcg was administered intravenously by the radiology nurse. Total intra-service moderate Sedation Time: 14 minutes. The patient's level of consciousness and vital signs were monitored continuously by radiology nursing throughout the procedure under my direct supervision. FLUOROSCOPY TIME:  Fluoroscopy Time: 1 minutes 54 seconds (26 mGy). COMPLICATIONS: None immediate. PROCEDURE: Informed written consent was obtained from the patient after a thorough discussion of the procedural risks, benefits and alternatives. All questions were addressed. Maximal Sterile Barrier Technique was utilized including caps, mask, sterile gowns, sterile gloves, sterile drape, hand hygiene and skin antiseptic. A timeout was performed prior to the initiation of the procedure. The patient was placed supine on the exam table. The right upper quadrant was prepped  and draped in the standard sterile fashion. Ultrasound was used to evaluate the gallbladder and select an appropriate skin entry site. Ultrasound demonstrated a gallbladder with thickened walls with internal sludge and gallstones. Skin entry site was marked and a subcostal location. Using ultrasound guidance, access was obtained into the gallbladder using a 21 gauge Chiba needle via a subcostal transhepatic approach. Location was confirmed with needle tip in the gallbladder lumen. An 018 wire was advanced into the gallbladder lumen and found to coil within on fluoroscopy. Subsequently, a transition dilator was advanced  into the gallbladder lumen. Location within the gallbladder lumen was confirmed with injection of contrast material. Wire was exchanged for an 035 Amplatz wire, and the subcutaneous tract was serially dilated to eventually accommodate a 12 French locking drainage catheter. Approximately 25 mL of dark thick bile was aspirated with a sample sent to the lab for analysis. Location of the drainage catheter was confirmed with injection of contrast material, which demonstrated a decompressed gallbladder with filling defects consistent with known gallstones. The drainage catheter was secured to skin using silk suture and a dressing. It was attached to bag drainage. The patient tolerated the procedure well, and was transferred in stable condition. IMPRESSION: Successful placement of a 56 French percutaneous cholecystostomy tube using ultrasound and fluoroscopic guidance for the treatment of acute calculus cholecystitis. Drainage catheter to stated bag drainage. Further recommendations regarding surgical candidacy per general surgery. IR will plan for routine tube exchange in approximately 6 weeks if interval cholecystectomy not yet performed. Electronically Signed   By: Albin Felling M.D.   On: 11/02/2021 15:30   DG Chest Port 1 View  Result Date: 11/02/2021 CLINICAL DATA:  Fall onto left side EXAM: PORTABLE CHEST 1 VIEW COMPARISON:  None. FINDINGS: The cardiomediastinal silhouette is normal. There is no focal consolidation or pulmonary edema. There is no pleural effusion or pneumothorax. There is no acute osseous abnormality. IMPRESSION: No radiographic evidence of acute cardiopulmonary process. Electronically Signed   By: Valetta Mole M.D.   On: 11/02/2021 10:28   DG Shoulder Left Portable  Result Date: 11/02/2021 CLINICAL DATA:  Status post fall, left shoulder pain EXAM: LEFT SHOULDER COMPARISON:  None. FINDINGS: No fracture or dislocation. Mild degenerative changes of the acromioclavicular joint. Soft tissues  are normal. IMPRESSION: No acute osseous injury of the left shoulder. Electronically Signed   By: Kathreen Devoid M.D.   On: 11/02/2021 10:26   ECHOCARDIOGRAM COMPLETE  Result Date: 11/06/2021    ECHOCARDIOGRAM REPORT   Patient Name:   Amanda [name] Date of Exam: 11/06/2021 Medical Rec #:  [id number]   Height:       60.0 in Accession #:    [id number]  Weight:       310.8 lb Date of Birth:  21-Apr-1956   BSA:          2.251 m Patient Age:    41 years    BP:           141/71 mmHg Patient Gender: F           HR:           97 bpm. Exam Location:  [facility] Procedure: 2D Echo, Cardiac Doppler and Color Doppler Indications:     Atrial Fibrillation I48.91  History:         Patient has no prior history of Echocardiogram examinations.                  Risk Factors:Hypertension, Diabetes and  Dyslipidemia.  Sonographer:     Sherrie Sport Referring Phys:  [id number] [place] Diagnosing Phys: Serafina Royals MD  Sonographer Comments: Suboptimal apical window. IMPRESSIONS  1. Left ventricular ejection fraction, by estimation, is 60 to 65%. The left ventricle has normal function. The left ventricle has no regional wall motion abnormalities. Left ventricular diastolic parameters were normal.  2. Right ventricular systolic function is normal. The right ventricular size is normal.  3. Left atrial size was mildly dilated.  4. The mitral valve is normal in structure. Mild mitral valve regurgitation.  5. The aortic valve is normal in structure. Aortic valve regurgitation is trivial. FINDINGS  Left Ventricle: Left ventricular ejection fraction, by estimation, is 60 to 65%. The left ventricle has normal function. The left ventricle has no regional wall motion abnormalities. The left ventricular internal cavity size was normal in size. There is  no left ventricular hypertrophy. Left ventricular diastolic parameters were normal. Right Ventricle: The right ventricular size is normal. No increase in right ventricular wall thickness. Right ventricular  systolic function is normal. Left Atrium: Left atrial size was mildly dilated. Right Atrium: Right atrial size was normal in size. Pericardium: There is no evidence of pericardial effusion. Mitral Valve: The mitral valve is normal in structure. Mild mitral valve regurgitation. Tricuspid Valve: The tricuspid valve is normal in structure. Tricuspid valve regurgitation is not demonstrated. Aortic Valve: The aortic valve is normal in structure. Aortic valve regurgitation is trivial. Aortic valve mean gradient measures 2.0 mmHg. Aortic valve peak gradient measures 3.5 mmHg. Aortic valve area, by VTI measures 3.40 cm. Pulmonic Valve: The pulmonic valve was normal in structure. Pulmonic valve regurgitation is not visualized. Aorta: The aortic root and ascending aorta are structurally normal, with no evidence of dilitation. IAS/Shunts: No atrial level shunt detected by color flow Doppler.  LEFT VENTRICLE PLAX 2D LVIDd:         3.50 cm LVIDs:         2.00 cm LV PW:         1.20 cm LV IVS:        0.65 cm LVOT diam:     2.00 cm LV SV:         50 LV SV Index:   22 LVOT Area:     3.14 cm  RIGHT VENTRICLE RV Basal diam:  3.60 cm RV S prime:     7.18 cm/s TAPSE (M-mode): 1.0 cm LEFT ATRIUM             Index        RIGHT ATRIUM           Index LA diam:        4.40 cm 1.95 cm/m   RA Area:     18.40 cm LA Vol (A2C):   76.8 ml 34.11 ml/m  RA Volume:   51.60 ml  22.92 ml/m LA Vol (A4C):   65.6 ml 29.14 ml/m LA Biplane Vol: 71.2 ml 31.63 ml/m  AORTIC VALVE                    PULMONIC VALVE AV Area (Vmax):    2.88 cm     PV Vmax:        0.62 m/s AV Area (Vmean):   2.70 cm     PV Vmean:       39.850 cm/s AV Area (VTI):     3.40 cm     PV VTI:  0.098 m AV Vmax:           93.30 cm/s   PV Peak grad:   1.5 mmHg AV Vmean:          68.100 cm/s  PV Mean grad:   1.0 mmHg AV VTI:            0.148 m      RVOT Peak grad: 2 mmHg AV Peak Grad:      3.5 mmHg AV Mean Grad:      2.0 mmHg LVOT Vmax:         85.50 cm/s LVOT Vmean:         58.500 cm/s LVOT VTI:          0.160 m LVOT/AV VTI ratio: 1.08  AORTA Ao Root diam: 2.50 cm MITRAL VALVE                TRICUSPID VALVE MV Area (PHT): 4.26 cm     TR Peak grad:   12.2 mmHg MV Decel Time: 178 msec     TR Vmax:        175.00 cm/s MV E velocity: 113.00 cm/s                             SHUNTS                             Systemic VTI:  0.16 m                             Systemic Diam: 2.00 cm                             Pulmonic VTI:  0.093 m Serafina Royals MD Electronically signed by Serafina Royals MD Signature Date/Time: 11/06/2021/1:26:00 PM    Final     EKG: No recent EKGs available for review  Weights: Filed Weights   11/03/21 0500 11/04/21 0500 11/05/21 0455  Weight: 79.5 kg 79.7 kg 79 kg     Physical Exam: Blood pressure (!) 141/71, pulse 97, temperature 97.7 F (36.5 C), temperature source Oral, resp. rate 18, height 5' (1.524 m), weight 79 kg, SpO2 96 %. Body mass index is 34.01 kg/m. General: Well developed, well nourished, in no acute distress. Head eyes ears nose throat: Normocephalic, atraumatic, sclera non-icteric, no xanthomas, nares are without discharge. No apparent thyromegaly and/or mass  Lungs: Normal respiratory effort.  no wheezes, no rales, no rhonchi.  Heart: Irregular rhythm with normal S1 S2. no murmur gallop, no rub, PMI is normal size and placement, carotid upstroke normal without bruit, jugular venous pressure is normal Abdomen: Soft, non-tender, non-distended with normoactive bowel sounds. No hepatomegaly. No rebound/guarding. No obvious abdominal masses. Abdominal aorta is normal size without bruit Extremities: No edema. no cyanosis, no clubbing, no ulcers  Peripheral : 2+ bilateral upper extremity pulses, 2+ bilateral femoral pulses, 2+ bilateral dorsal pedal pulse Neuro: Alert and oriented. No facial asymmetry. No focal deficit. Moves all extremities spontaneously. Musculoskeletal: Normal muscle tone without kyphosis Psych:  Responds to questions  appropriately with a normal affect.    Assessment: 66 year old female with new onset atrial fibrillation in the setting of septis secondary to acute cholecystitis.  Currently not symptomatic and with no evidence of CHF, significant valvular disease by echocardiogram.  Plan:  Continue diltiazem 30 mg every 6 hours for heart rate control with atrial fibrillation. Metoprolol as needed for heart rates of greater than 130 bpm. Continue to hold off on anticoagulation due to thrombocytopenia in the setting of sepsis.  Would consider addition of anticoagulation if platelet levels improve Continue current management of septic shock as per hospitalist   Signed, Jettie Booze [facility] 11/06/2021, 4:31 PM  The patient has been interviewed and examined. I agree with assessment and plan above. Serafina Royals MD [facility]

## 2021-11-07 ENCOUNTER — Telehealth: Payer: Self-pay

## 2021-11-07 DIAGNOSIS — N1831 Chronic kidney disease, stage 3a: Secondary | ICD-10-CM

## 2021-11-07 DIAGNOSIS — N179 Acute kidney failure, unspecified: Secondary | ICD-10-CM

## 2021-11-07 DIAGNOSIS — N189 Chronic kidney disease, unspecified: Secondary | ICD-10-CM

## 2021-11-07 DIAGNOSIS — I4891 Unspecified atrial fibrillation: Secondary | ICD-10-CM

## 2021-11-07 DIAGNOSIS — A4151 Sepsis due to Escherichia coli [E. coli]: Secondary | ICD-10-CM

## 2021-11-07 DIAGNOSIS — R197 Diarrhea, unspecified: Secondary | ICD-10-CM

## 2021-11-07 DIAGNOSIS — E1122 Type 2 diabetes mellitus with diabetic chronic kidney disease: Secondary | ICD-10-CM

## 2021-11-07 LAB — CBC
HCT: 30.8 % — ABNORMAL LOW (ref 36.0–46.0)
Hemoglobin: 10.6 g/dL — ABNORMAL LOW (ref 12.0–15.0)
MCH: 29.3 pg (ref 26.0–34.0)
MCHC: 34.4 g/dL (ref 30.0–36.0)
MCV: 85.1 fL (ref 80.0–100.0)
Platelets: 76 10*3/uL — ABNORMAL LOW (ref 150–400)
RBC: 3.62 MIL/uL — ABNORMAL LOW (ref 3.87–5.11)
RDW: 13.7 % (ref 11.5–15.5)
WBC: 16.8 10*3/uL — ABNORMAL HIGH (ref 4.0–10.5)
nRBC: 0 % (ref 0.0–0.2)

## 2021-11-07 LAB — GLUCOSE, CAPILLARY
Glucose-Capillary: 156 mg/dL — ABNORMAL HIGH (ref 70–99)
Glucose-Capillary: 255 mg/dL — ABNORMAL HIGH (ref 70–99)
Glucose-Capillary: 178 mg/dL — ABNORMAL HIGH (ref 70–99)
Glucose-Capillary: 206 mg/dL — ABNORMAL HIGH (ref 70–99)

## 2021-11-07 LAB — BASIC METABOLIC PANEL
Anion gap: 8 (ref 5–15)
BUN: 55 mg/dL — ABNORMAL HIGH (ref 8–23)
CO2: 21 mmol/L — ABNORMAL LOW (ref 22–32)
Calcium: 8 mg/dL — ABNORMAL LOW (ref 8.9–10.3)
Chloride: 108 mmol/L (ref 98–111)
Creatinine, Ser: 1.04 mg/dL — ABNORMAL HIGH (ref 0.44–1.00)
GFR, Estimated: 59 mL/min — ABNORMAL LOW (ref >60)
Glucose, Bld: 171 mg/dL — ABNORMAL HIGH (ref 70–99)
Potassium: 3 mmol/L — ABNORMAL LOW (ref 3.5–5.1)
Sodium: 137 mmol/L (ref 135–145)

## 2021-11-07 LAB — AEROBIC/ANAEROBIC CULTURE W GRAM STAIN (SURGICAL/DEEP WOUND)

## 2021-11-07 MED ORDER — LOPERAMIDE HCL 2 MG PO CAPS: 4.0000 mg | ORAL_CAPSULE | Freq: Three times a day (TID) | ORAL | Status: DC | PRN

## 2021-11-07 MED ORDER — POTASSIUM CHLORIDE CRYS ER 20 MEQ PO TBCR
40.0000 meq | EXTENDED_RELEASE_TABLET | Freq: Once | ORAL | Status: AC
  Administered 2021-11-07: 40 meq via ORAL
  Filled 2021-11-07: qty 2

## 2021-11-07 MED ORDER — CARVEDILOL 12.5 MG PO TABS
12.5000 mg | ORAL_TABLET | Freq: Two times a day (BID) | ORAL | Status: DC
  Administered 2021-11-07 – 2021-11-08 (×3): 12.5 mg via ORAL
  Filled 2021-11-07 (×3): qty 1

## 2021-11-07 MED ORDER — DILTIAZEM HCL ER COATED BEADS 180 MG PO CP24
180.0000 mg | ORAL_CAPSULE | Freq: Every day | ORAL | Status: DC
  Administered 2021-11-07 – 2021-11-08 (×2): 180 mg via ORAL
  Filled 2021-11-07 (×3): qty 1

## 2021-11-07 NOTE — Progress Notes (Signed)
Patient ID: Amanda [name], female   DOB: 1956/10/19, 66 y.o.   MRN: [id number] [facility] PROGRESS NOTE  Amanda [name] [id number] DOB: 03/30/1956 DOA: 11/02/2021 PCP: Charlynne Cousins, MD  HPI/Subjective: Patient complains of a lot of diarrhea this morning.  Initially admitted with septic shock and E. coli bacteremia.  Patient had a cholecystectomy tube placed.  Patient went into atrial fibrillation.  Objective: Vitals:   11/07/21 0436 11/07/21 0736  BP: (!) 150/82 (!) 161/72  Pulse: 91 (!) 108  Resp:  16  Temp: 97.8 F (36.6 C) 98.3 F (36.8 C)  SpO2: 97% 99%    Intake/Output Summary (Last 24 hours) at 11/07/2021 1603 Last data filed at 11/07/2021 0900 Gross per 24 hour  Intake 360 ml  Output --  Net 360 ml   Filed Weights   11/03/21 0500 11/04/21 0500 11/05/21 0455  Weight: 79.5 kg 79.7 kg 79 kg    ROS: Review of Systems  Respiratory:  Negative for shortness of breath.   Cardiovascular:  Negative for chest pain.  Gastrointestinal:  Positive for abdominal pain and diarrhea. Negative for nausea and vomiting.  Exam: Physical Exam HENT:     Head: Normocephalic.     Mouth/Throat:     Pharynx: No oropharyngeal exudate.  Eyes:     General: Lids are normal.     Conjunctiva/sclera: Conjunctivae normal.  Cardiovascular:     Rate and Rhythm: Normal rate and regular rhythm.     Heart sounds: Normal heart sounds, S1 normal and S2 normal.  Pulmonary:     Breath sounds: No decreased breath sounds, wheezing, rhonchi or rales.  Abdominal:     Palpations: Abdomen is soft.     Tenderness: There is no abdominal tenderness.  Musculoskeletal:     Right lower leg: No swelling.     Left lower leg: No swelling.  Skin:    General: Skin is warm.     Findings: No rash.  Neurological:     Mental Status: She is alert and oriented to person, place, and time.      Scheduled Meds:  carvedilol  12.5 mg Oral BID WC   cephALEXin  500 mg Oral TID WC & HS   Chlorhexidine Gluconate  Cloth  6 each Topical Daily   diltiazem  180 mg Oral Daily   insulin aspart  0-20 Units Subcutaneous TID WC   mouth rinse  15 mL Mouth Rinse BID   metroNIDAZOLE  500 mg Oral BID   sodium chloride flush  5 mL Intracatheter Q8H   traZODone  50 mg Oral QHS     Assessment/Plan:  Septic shock, present on admission with E. coli growing in the blood cultures.  Patient has acute cholecystitis and a cholecystectomy drainage tube was placed by IR on 11/02/2020.  Patient on Keflex and Flagyl.  We will get rid of Flagyl and continue Keflex. Diarrhea.  Stool comprehensive panel sent off.  Discontinued as needed MiraLAX and Colace.  Patient's been in the hospital too long for a C. difficile test to be sent off. New onset atrial fibrillation.  Patient on Cardizem CD.  We will add back Coreg that she takes at home.  Holding off on anticoagulation with thrombocytopenia. Acute kidney injury on chronic kidney disease stage IIIa.  Creatinine 2.89 on presentation and down to 1.04. Type 2 diabetes mellitus with CKD stage IIIa.  With hemoglobin A1c of 6.9.  Patient on sliding scale insulin for now.  Code Status:     Code Status Orders  (From admission, onward)           Start     Ordered   11/02/21 1410  Full code  Continuous        11/02/21 1409           Code Status History     This patient has a current code status but no historical code status.      Family Communication: Family at bedside Disposition Plan: Status is: Inpatient  Antibiotics: Keflex. Discontinue Flagyl.  [place]  [facility] [employer]

## 2021-11-07 NOTE — Progress Notes (Signed)
[facility] Cardiology [facility] Encounter Note  Patient: Amanda [name] / Admit Date: 11/02/2021 / Date of Encounter: 11/07/2021, 11:49 AM   Subjective: 66 y.o. female with past medical history of hypertension, hyperlipidemia, type 2 diabetes currently admitted to the hospital with septic shock secondary to acute cholecystitis, not currently requiring pressors for blood pressure support. Patient went into new onset atrial fibrillation on 11/05/2021 which was subsequently rate controlled with Cardizem.    Today, patient has slightly worse heart rate control with average HR between 95-110 on exam this morning after receiving her morning Cardizem. She remains asymptomatic with no complaints of SOB, fatigue, weakness, palpitations.   Echocardiogram showed normal left ventricular systolic function with an EF of 60-65%, normal RV systolic function with mildly dilated left atrium.  No significant valvular abnormalities.  Review of Systems: Positive HQI:ONGE Negative for: Vision change, hearing change, syncope, dizziness, nausea, vomiting,diarrhea, bloody stool, stomach pain, cough, congestion, diaphoresis, urinary frequency, urinary pain,skin lesions, skin rashes Others previously listed  Objective: Telemetry: atrial fibrillation Physical Exam: Blood pressure (!) 161/72, pulse (!) 108, temperature 98.3 F (36.8 C), temperature source Oral, resp. rate 16, height 5' (1.524 m), weight 79 kg, SpO2 99 %. Body mass index is 34.01 kg/m. General: Well developed, well nourished, in no acute distress. Head: Normocephalic, atraumatic, sclera non-icteric, no xanthomas, nares are without discharge. Neck: No apparent masses Lungs: Normal respirations with no wheezes, no rhonchi, no rales , no crackles   Heart: Regular rate and rhythm, normal S1 S2, no murmur, no rub, no gallop, PMI is normal size and placement, carotid upstroke normal without bruit, jugular venous pressure normal Abdomen: Soft, non-tender,  non-distended with normoactive bowel sounds. No hepatosplenomegaly. Abdominal aorta is normal size without bruit Extremities: No edema, no clubbing, no cyanosis, no ulcers,  Peripheral: 2+ radial, 2+ femoral, 2+ dorsal pedal pulses Neuro: Alert and oriented. Moves all extremities spontaneously. Psych:  Responds to questions appropriately with a normal affect.   Intake/Output Summary (Last 24 hours) at 11/07/2021 1149 Last data filed at 11/07/2021 0900 Gross per 24 hour  Intake 360 ml  Output --  Net 360 ml    Inpatient Medications:   carvedilol  12.5 mg Oral BID WC   cephALEXin  500 mg Oral TID WC & HS   Chlorhexidine Gluconate Cloth  6 each Topical Daily   diltiazem  180 mg Oral Daily   insulin aspart  0-20 Units Subcutaneous TID WC   mouth rinse  15 mL Mouth Rinse BID   metroNIDAZOLE  500 mg Oral BID   sodium chloride flush  5 mL Intracatheter Q8H   traZODone  50 mg Oral QHS   Infusions:   Labs: Recent Labs    11/04/21 1837 11/05/21 0445 11/06/21 0522 11/07/21 0500  NA 135 135 138 137  K 3.1* 4.1 3.8 3.0*  CL 105 106 107 108  CO2 21* 21* 19* 21*  GLUCOSE 153* 135* 169* 171*  BUN 59* 63* 62* 55*  CREATININE 1.67* 1.62* 1.46* 1.04*  CALCIUM 7.5* 7.8* 8.1* 8.0*  MG 1.9 2.5* 2.3  --   PHOS 3.0 2.8  --   --    Recent Labs    11/05/21 0445 11/06/21 0522  AST 53* 36  ALT 39 33  ALKPHOS 74 85  BILITOT 1.3* 1.1  PROT 5.4* 5.6*  ALBUMIN 2.5* 2.9*   Recent Labs    11/06/21 0522 11/07/21 0500  WBC 13.5* 16.8*  HGB 10.2* 10.6*  HCT 29.2* 30.8*  MCV  86.4 85.1  PLT 62* 76*   No results for input(s): CKTOTAL, CKMB, TROPONINI in the last 72 hours. Invalid input(s): POCBNP Recent Labs    11/05/21 0445  HGBA1C 6.9*     Weights: Filed Weights   11/03/21 0500 11/04/21 0500 11/05/21 0455  Weight: 79.5 kg 79.7 kg 79 kg     Radiology/Studies:  CT ABDOMEN PELVIS WO CONTRAST  Result Date: 11/02/2021 CLINICAL DATA:  Nausea, vomiting, diarrhea, acute  kidney injury, vomiting and chills for 3 days EXAM: CT ABDOMEN AND PELVIS WITHOUT CONTRAST TECHNIQUE: Multidetector CT imaging of the abdomen and pelvis was performed following the standard protocol without IV contrast. COMPARISON:  None. FINDINGS: Lower chest: Trace left pleural effusion. Coronary artery atherosclerosis. Hepatobiliary: No focal hepatic mass. Distended gallbladder with cholelithiasis. Gallbladder wall thickening. No intrahepatic or extrahepatic biliary ductal dilatation. Pancreas: Unremarkable. No pancreatic ductal dilatation or surrounding inflammatory changes. Spleen: Normal in size without focal abnormality. Adrenals/Urinary Tract: 2.4 cm left adrenal mass measuring 21 Hounsfield units of indeterminate etiology. 2.6 cm right adrenal mass of indeterminate etiology measuring 24 Hounsfield units. No urolithiasis. Mild left hydroureteronephrosis. Bilateral perinephric fat stranding. Normal bladder. Stomach/Bowel: Stomach is within normal limits. Appendix appears normal. No evidence of bowel wall thickening, distention, or inflammatory changes. Multiple fluid-filled loops of small bowel. Vascular/Lymphatic: Aortic atherosclerosis. No enlarged abdominal or pelvic lymph nodes. Reproductive: Uterus and bilateral adnexa are unremarkable. Other: No abdominal wall hernia or abnormality. No abdominopelvic ascites. Musculoskeletal: No acute osseous abnormality. No aggressive osseous lesion. Degenerative disease with disc height loss and facet arthropathy throughout the thoracolumbar spine. IMPRESSION: 1. Cholelithiasis with gallbladder distension and gallbladder wall thickening concerning Hattie Per EAD WITHOUT CONTRAST TECHNIQUE: Contiguous axial images were obtained from the base of the skull through the vertex without intravenous contrast. RADIATION DOSE REDUCTION: This exam was performed according to the departmental dose-optimization program which includes automated exposure control, adjustment of the mA and/or kV according to patient size and/or use of iterative reconstruction technique. COMPARISON:  None. FINDINGS: Brain: No evidence of acute infarction, hemorrhage, hydrocephalus, extra-axial collection or mass lesion/mass effect. Vascular: No hyperdense vessel or unexpected calcification. Skull: No osseous abnormality. Sinuses/Orbits: Opacification of the right maxillary sinus. Visualized mastoid sinuses are clear. Visualized orbits demonstrate no focal abnormality. Other: None IMPRESSION: No acute intracranial pathology. Electronically Signed   By: Hetal  Patel M.D.   On: 11/02/2021 12:07   IR Perc Cholecystostomy  Result Date: 11/02/2021 INDICATION: Acute cholecystitis with sepsis EXAM: 1. Ultrasound-guided puncture of the gallbladder via a subcostal transhepatic  approach 2. Cholecystogram 3. Placement of a percutaneous cholecystostomy tube using fluoroscopic guidance MEDICATIONS: Patient received IV antibiotics per sepsis protocol prior to arrival to the IR suite ANESTHESIA/SEDATION: Moderate (conscious) sedation was employed during this procedure. A total of Versed 1 mg and Fentanyl 50 mcg was administered intravenously by the radiology nurse. Total intra-service moderate Sedation Time: 14 minutes. The patient's level of consciousness and vital signs were monitored continuously by radiology nursing throughout the procedure under my direct supervision. FLUOROSCOPY TIME:  Fluoroscopy Time: 1 minutes 54 seconds (26 mGy). COMPLICATIONS: None immediate. PROCEDURE: Informed written consent was obtained from the patient after a thorough discussion of the procedural risks, benefits and alternatives. All questions were addressed. Maximal Sterile Barrier Technique was utilized including caps, mask, sterile gowns, sterile gloves, sterile drape, hand hygiene and skin antiseptic. A timeout was performed prior to the initiation of the procedure. The patient was placed supine on the exam table. The right upper quadrant was prepped and draped in the standard sterile fashion. Ultrasound was used to evaluate the gallbladder and select an appropriate skin entry site. Ultrasound demonstrated a gallbladder with thickened walls with internal sludge and gallstones. Skin entry site was marked and a subcostal location. Using ultrasound guidance, access was obtained into the gallbladder using a 21 gauge Chiba needle via a subcostal transhepatic approach. Location was confirmed with needle tip in the gallbladder lumen. An 018 wire was advanced into the gallbladder lumen and found to coil within on fluoroscopy. Subsequently, a transition dilator was advanced into the gallbladder lumen. Location within the gallbladder lumen was confirmed with injection of contrast material. Wire was exchanged for an  035 Amplatz wire, and the subcutaneous tract was serially dilated to eventually accommodate a 12 French locking drainage catheter. Approximately 25 mL of dark thick bile was aspirated with a sample sent to the lab for analysis. Location of the drainage catheter was confirmed with injection of contrast material, which demonstrated a decompressed gallbladder with filling defects consistent with known gallstones. The drainage catheter was secured to skin using silk suture and a dressing. It was attached to bag drainage. The patient tolerated the procedure well, and was transferred in stable condition. IMPRESSION: Successful placement of a 55 French percutaneous cholecystostomy tube using ultrasound and fluoroscopic guidance for the treatment of acute calculus cholecystitis. Drainage catheter to stated bag drainage. Further recommendations regarding surgical candidacy per general surgery. IR will plan for routine tube exchange in approximately 6 weeks if interval cholecystectomy not yet performed. Electronically Signed   By: Olive Bass M.D.   On: 11/02/2021 15:30   DG Chest Port 1 View  Result Date: 11/02/2021 CLINICAL DATA:  Fall onto left side EXAM: PORTABLE CHEST 1 VIEW COMPARISON:  None. FINDINGS: The cardiomediastinal silhouette is normal. There is no focal consolidation or pulmonary edema. There is no pleural effusion or pneumothorax. There is no acute osseous abnormality. IMPRESSION: No radiographic evidence of acute cardiopulmonary process. Electronically Signed   By: Lesia Hausen M.D.   On: 11/02/2021 10:28   DG Shoulder Left Portable  Result Date: 11/02/2021 CLINICAL DATA:  Status post fall, left shoulder pain EXAM: LEFT SHOULDER COMPARISON:  None. FINDINGS: No fracture or dislocation. Mild degenerative changes of the acromioclavicular joint. Soft tissues are normal. IMPRESSION: No acute osseous injury of the left shoulder. Electronically Signed   By: Elige Ko M.D.   On: 11/02/2021 10:26  ECHOCARDIOGRAM COMPLETE  Result Date: 11/06/2021    ECHOCARDIOGRAM REPORT   Patient Name:   Amanda [name] Date of Exam: 11/06/2021 Medical Rec #:  [id number]   Height:       60.0 in Accession #:    [id number]  Weight:       310.8 lb Date of Birth:  Sep 15, 1956   BSA:          2.251 m Patient Age:    65 years    BP:           141/71 mmHg Patient Gender: F           HR:           97 bpm. Exam Location:  [facility] Procedure: 2D Echo, Cardiac Doppler and Color Doppler Indications:     Atrial Fibrillation I48.91  History:         Patient has no prior history of Echocardiogram examinations.                  Risk Factors:Hypertension, Diabetes and Dyslipidemia.  Sonographer:     Cristela Blue Referring Phys:  [id number] JENNIFER CHOI Diagnosing Phys: Arnoldo Hooker MD  Sonographer Comments: Suboptimal apical window. IMPRESSIONS  1. Left ventricular ejection fraction, by estimation, is 60 to 65%. The left ventricle has normal function. The left ventricle has no regional wall motion abnormalities. Left ventricular diastolic parameters were normal.  2. Right ventricular systolic function is normal. The right ventricular size is normal.  3. Left atrial size was mildly dilated.  4. The mitral valve is normal in structure. Mild mitral valve regurgitation.  5. The aortic valve is normal in structure. Aortic valve regurgitation is trivial. FINDINGS  Left Ventricle: Left ventricular ejection fraction, by estimation, is 60 to 65%. The left ventricle has normal function. The left ventricle has no regional wall motion abnormalities. The left ventricular internal cavity size was normal in size. There is  no left ventricular hypertrophy. Left ventricular diastolic parameters were normal. Right Ventricle: The right ventricular size is normal. No increase in right ventricular wall thickness. Right ventricular systolic function is normal. Left Atrium: Left atrial size was mildly dilated. Right Atrium: Right atrial size was normal in size.  Pericardium: There is no evidence of pericardial effusion. Mitral Valve: The mitral valve is normal in structure. Mild mitral valve regurgitation. Tricuspid Valve: The tricuspid valve is normal in structure. Tricuspid valve regurgitation is not demonstrated. Aortic Valve: The aortic valve is normal in structure. Aortic valve regurgitation is trivial. Aortic valve mean gradient measures 2.0 mmHg. Aortic valve peak gradient measures 3.5 mmHg. Aortic valve area, by VTI measures 3.40 cm. Pulmonic Valve: The pulmonic valve was normal in structure. Pulmonic valve regurgitation is not visualized. Aorta: The aortic root and ascending aorta are structurally normal, with no evidence of dilitation. IAS/Shunts: No atrial level shunt detected by color flow Doppler.  LEFT VENTRICLE PLAX 2D LVIDd:         3.50 cm LVIDs:         2.00 cm LV PW:         1.20 cm LV IVS:        0.65 cm LVOT diam:     2.00 cm LV SV:         50 LV SV Index:   22 LVOT Area:     3.14 cm  RIGHT VENTRICLE RV Basal diam:  3.60 cm RV S prime:     7.18 cm/s TAPSE (M-mode): 1.0 cm  LEFT ATRIUM             Index        RIGHT ATRIUM           Index LA diam:        4.40 cm 1.95 cm/m   RA Area:     18.40 cm LA Vol (A2C):   76.8 ml 34.11 ml/m  RA Volume:   51.60 ml  22.92 ml/m LA Vol (A4C):   65.6 ml 29.14 ml/m LA Biplane Vol: 71.2 ml 31.63 ml/m  AORTIC VALVE                    PULMONIC VALVE AV Area (Vmax):    2.88 cm     PV Vmax:        0.62 m/s AV Area (Vmean):   2.70 cm     PV Vmean:       39.850 cm/s AV Area (VTI):     3.40 cm     PV VTI:         0.098 m AV Vmax:           93.30 cm/s   PV Peak grad:   1.5 mmHg AV Vmean:          68.100 cm/s  PV Mean grad:   1.0 mmHg AV VTI:            0.148 m      RVOT Peak grad: 2 mmHg AV Peak Grad:      3.5 mmHg AV Mean Grad:      2.0 mmHg LVOT Vmax:         85.50 cm/s LVOT Vmean:        58.500 cm/s LVOT VTI:          0.160 m LVOT/AV VTI ratio: 1.08  AORTA Ao Root diam: 2.50 cm MITRAL VALVE                TRICUSPID  VALVE MV Area (PHT): 4.26 cm     TR Peak grad:   12.2 mmHg MV Decel Time: 178 msec     TR Vmax:        175.00 cm/s MV E velocity: 113.00 cm/s                             SHUNTS                             Systemic VTI:  0.16 m                             Systemic Diam: 2.00 cm                             Pulmonic VTI:  0.093 m Arnoldo Hooker MD Electronically signed by Arnoldo Hooker MD Signature Date/Time: 11/06/2021/1:26:00 PM    Final      Assessment and Recommendation  65 y.o. female 65 year old female with new onset atrial fibrillation in the setting of septis secondary to acute cholecystitis.  Currently not symptomatic and with no evidence of CHF, significant valvular disease by echocardiogram.  Plan: Change to Cardizem CD 180 mg once daily for better heart rate control with atrial fibrillation  Metoprolol as needed for heart rates of greater than 130 bpm  Continue to hold off on anticoagulation due to thrombocytopenia in the setting of sepsis.  Would consider addition of anticoagulation if platelet levels improved. Continue current management of septic shock as per hospitalist   Signed, Maralyn Sago, PA-C

## 2021-11-07 NOTE — Chronic Care Management (AMB) (Signed)
Error

## 2021-11-07 NOTE — TOC Progression Note (Signed)
Transition of Care [facility]) - Progression Note    Patient Details  Name: Amanda [name] MRN: [id number] Date of Birth: 1956/02/23  Transition of Care [facility]) CM/SW Contact  Maree Krabbe, LCSW Phone Number: 11/07/2021, 3:24 PM  Clinical Narrative:    Pts HH is arranged through [facility].   Expected Discharge Plan: Home w Home Health Services Barriers to Discharge: Continued Medical Work up  Expected Discharge Plan and Services Expected Discharge Plan: Home w Home Health Services   Discharge Planning Services: CM Consult Post Acute Care Choice: Home Health Living arrangements for the past 2 months: Mobile Home                 DME Arranged: Walker rolling, 3-N-1 DME Agency: [employer] Date DME Agency Contacted: 11/05/21 Time DME Agency Contacted: 1600 Representative spoke with at DME Agency: Bjorn Loser HH Arranged: RN, PT, OT [facility] Agency: [facility] Date [facility] Agency Contacted: 11/05/21 Time HH Agency Contacted: 1600 Representative spoke with at [facility] Agency: [place]   Social Determinants of Health (SDOH) Interventions    Readmission Risk Interventions No flowsheet data found.

## 2021-11-07 NOTE — Progress Notes (Signed)
Physical Therapy Treatment Patient Details Name: Amanda [name] MRN: [id number] DOB: November 14, 1955 Today's Date: 11/07/2021   History of Present Illness Pt. is a 66 y.o. female with acute cholecystitis presenting with septic shock. S/p Cholecstomy 1/13.    PT Comments    Pt received in bed agreeable to PT. Family present in room. Remains supervision for bed mobility, transfers, and ambulation progressing to 46' with RW. Consistent step through pattern today with reduction in RR with ambulation to 33 breaths/min at max compared to 38-40 breaths/min indicative of improved tolerance for OOB mobility. Pt returned to seated EOB and educated pt and pt's sister on benefits of DME for falls risk, energy conservation, with brief review of LE therex and importance for strength to improve mobility with ADL's. Pt and pt's sister verbalizing understanding. Pt left seated EoB with all needs in reach. D/c recs remain appropriate.     Recommendations for follow up therapy are one component of a multi-disciplinary discharge planning process, led by the attending physician.  Recommendations may be updated based on patient status, additional functional criteria and insurance authorization.  Follow Up Recommendations  Home health PT     Assistance Recommended at Discharge Intermittent Supervision/Assistance  Patient can return home with the following A little help with bathing/dressing/bathroom;Assistance with cooking/housework;Assist for transportation;Help with stairs or ramp for entrance;A little help with walking and/or transfers   Equipment Recommendations  Rolling walker (2 wheels);BSC/3in1    Recommendations for Other Services       Precautions / Restrictions Precautions Precautions: Fall Restrictions Weight Bearing Restrictions: No     Mobility  Bed Mobility Overal bed mobility: Modified Independent Bed Mobility: Supine to Sit     Supine to sit: Modified independent (Device/Increase time)      General bed mobility comments: Seated EOB post session. Patient Response: Cooperative  Transfers Overall transfer level: Needs assistance Equipment used: Rolling walker (2 wheels) Transfers: Sit to/from Stand Sit to Stand: Supervision           General transfer comment: cuing for hand placement    Ambulation/Gait Ambulation/Gait assistance: Supervision Gait Distance (Feet): 118 Feet Assistive device: Rolling walker (2 wheels) Gait Pattern/deviations: Step-through pattern, Decreased stride length       General Gait Details: Pt lightly relying on RW for support.   Stairs             Wheelchair Mobility    Modified Rankin (Stroke Patients Only)       Balance Overall balance assessment: Needs assistance Sitting-balance support: Feet unsupported, Bilateral upper extremity supported Sitting balance-Amanda [name] Scale: Fair     Standing balance support: During functional activity, Reliant on assistive device for balance Standing balance-Amanda [name] Scale: Fair                              Cognition Arousal/Alertness: Awake/alert Behavior During Therapy: WFL for tasks assessed/performed Overall Cognitive Status: Within Functional Limits for tasks assessed                                          Exercises Other Exercises Other Exercises: Education to pt and pt's sister on benefits of DME, HH PT, safe mobility in household, energy conservation techniques.    General Comments General comments (skin integrity, edema, etc.): HR up to 134 BPM with ambulation. Decreased SOB today with ambualtion with RR  up to 33.      Pertinent Vitals/Pain Pain Assessment Pain Assessment: No/denies pain    Home Living                          Prior Function            PT Goals (current goals can now be found in the care plan section) Acute Rehab PT Goals Patient Stated Goal: to return home PT Goal Formulation: With patient Time For Goal  Achievement: 11/19/21 Potential to Achieve Goals: Good Progress towards PT goals: Progressing toward goals    Frequency    Min 2X/week      PT Plan Current plan remains appropriate    Co-evaluation              AM-PAC PT "6 Clicks" Mobility   Outcome Measure  Help needed turning from your back to your side while in a flat bed without using bedrails?: A Little Help needed moving from lying on your back to sitting on the side of a flat bed without using bedrails?: A Little Help needed moving to and from a bed to a chair (including a wheelchair)?: A Little Help needed standing up from a chair using your arms (e.g., wheelchair or bedside chair)?: A Little Help needed to walk in hospital room?: A Little Help needed climbing 3-5 steps with a railing? : A Little 6 Click Score: 18    End of Session Equipment Utilized During Treatment: Gait belt Activity Tolerance: Patient tolerated treatment well Patient left: in bed;with call bell/phone within reach;with family/visitor present Nurse Communication: Mobility status PT Visit Diagnosis: Other abnormalities of gait and mobility (R26.89);Muscle weakness (generalized) (M62.81)     Time: 4765-4650 PT Time Calculation (min) (ACUTE ONLY): 23 min  Charges:  $Therapeutic Exercise: 8-22 mins $Therapeutic Activity: 8-22 mins                     Vallen Calabrese M. Fairly IV, PT, DPT Physical Therapist- [facility]  11/07/2021, 4:08 PM

## 2021-11-08 DIAGNOSIS — N182 Chronic kidney disease, stage 2 (mild): Secondary | ICD-10-CM

## 2021-11-08 DIAGNOSIS — K819 Cholecystitis, unspecified: Secondary | ICD-10-CM

## 2021-11-08 DIAGNOSIS — I480 Paroxysmal atrial fibrillation: Secondary | ICD-10-CM

## 2021-11-08 LAB — BASIC METABOLIC PANEL
Anion gap: 8 (ref 5–15)
BUN: 45 mg/dL — ABNORMAL HIGH (ref 8–23)
CO2: 22 mmol/L (ref 22–32)
Calcium: 7.9 mg/dL — ABNORMAL LOW (ref 8.9–10.3)
Chloride: 110 mmol/L (ref 98–111)
Creatinine, Ser: 0.79 mg/dL (ref 0.44–1.00)
GFR, Estimated: >60 mL/min (ref >60)
Glucose, Bld: 179 mg/dL — ABNORMAL HIGH (ref 70–99)
Potassium: 3.6 mmol/L (ref 3.5–5.1)
Sodium: 140 mmol/L (ref 135–145)

## 2021-11-08 LAB — CBC
HCT: 31.2 % — ABNORMAL LOW (ref 36.0–46.0)
Hemoglobin: 10.7 g/dL — ABNORMAL LOW (ref 12.0–15.0)
MCH: 29.6 pg (ref 26.0–34.0)
MCHC: 34.3 g/dL (ref 30.0–36.0)
MCV: 86.4 fL (ref 80.0–100.0)
Platelets: 91 10*3/uL — ABNORMAL LOW (ref 150–400)
RBC: 3.61 MIL/uL — ABNORMAL LOW (ref 3.87–5.11)
RDW: 13.8 % (ref 11.5–15.5)
WBC: 15.4 10*3/uL — ABNORMAL HIGH (ref 4.0–10.5)
nRBC: 0 % (ref 0.0–0.2)

## 2021-11-08 LAB — GLUCOSE, CAPILLARY: Glucose-Capillary: 185 mg/dL — ABNORMAL HIGH (ref 70–99)

## 2021-11-08 MED ORDER — APIXABAN 5 MG PO TABS: 5.0000 mg | ORAL_TABLET | Freq: Two times a day (BID) | ORAL | 0 refills | Status: DC

## 2021-11-08 MED ORDER — TRAZODONE HCL 50 MG PO TABS: 50.0000 mg | ORAL_TABLET | Freq: Every day | ORAL | 0 refills | Status: DC

## 2021-11-08 MED ORDER — CARVEDILOL 12.5 MG PO TABS: 12.5000 mg | ORAL_TABLET | Freq: Two times a day (BID) | ORAL | 0 refills | Status: DC

## 2021-11-08 MED ORDER — DILTIAZEM HCL ER COATED BEADS 180 MG PO CP24: 180.0000 mg | ORAL_CAPSULE | Freq: Every day | ORAL | 0 refills | Status: DC

## 2021-11-08 MED ORDER — APIXABAN 5 MG PO TABS
5.0000 mg | ORAL_TABLET | Freq: Two times a day (BID) | ORAL | Status: DC
  Administered 2021-11-08: 5 mg via ORAL
  Filled 2021-11-08: qty 1

## 2021-11-08 MED ORDER — CEPHALEXIN 500 MG PO CAPS: 500.0000 mg | ORAL_CAPSULE | Freq: Three times a day (TID) | ORAL | 0 refills | Status: AC

## 2021-11-08 NOTE — Discharge Summary (Signed)
[facility] [place] NAME: Amanda [name]    MR#:  [id number]  DATE OF BIRTH:  03-07-1956  DATE OF ADMISSION:  11/02/2021 ADMITTING PHYSICIAN: Dessa Phi, DO  DATE OF DISCHARGE: 11/08/2021 11:39 AM  PRIMARY CARE PHYSICIAN: Charlynne Cousins, MD    ADMISSION DIAGNOSIS:  Cholecystitis [K81.9] Septic shock ([place]) [A41.9, R65.21]  DISCHARGE DIAGNOSIS:  Septic shock secondary to E. 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  Septic shock, present on admission with E. coli growing in the blood cultures.  Patient has acute cholecystitis and a cholecystectomy drain was placed by interventional radiology on 11/02/2020.  I discontinued Flagyl yesterday and continued on Keflex.  Patient will receive 4 more days of Keflex upon disposition.  Follow-up with Dr. Hampton Abbot for any issues with the biliary drain and for outpatient cholecystectomy. 2.  Diarrhea.  Likely secondary to antibiotics.  Got rid of as needed MiraLAX and Colace.  Patient in the hospital too long to send off C. difficile testing.  No fever.  I did order comprehensive panel but did not have a bowel movement yesterday. 3.  New onset atrial fibrillation.  Patient on Cardizem CD and added back Coreg.  Since platelet count has improved we did start Eliquis.  Asked TOC to give 30-day free Eliquis card. 4.  Acute kidney injury on chronic kidney disease stage II.  Creatinine 2.89 on presentation and came down to 0.79 on discharge 5.  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  Hemoglobin A1c 6.9.  Patient on sliding scale insulin during the hospital course.  Can go back on Trulicity at  home. 6.  Thrombocytopenia secondary to sepsis.  Platelet count dropped as low as 59,000.  Up to 91,000 upon disposition.  DISCHARGE CONDITIONS:   Satisfactory  CONSULTS OBTAINED:  General surgery Cardiology  DRUG ALLERGIES:   Allergies  Allergen Reactions   Tramadol Other (See Comments)    "woozy"   Penicillins Other (See Comments)    Dizziness Tolerated augmentin but does not like taste    DISCHARGE MEDICATIONS:   Allergies as of 11/08/2021       Reactions   Tramadol Other (See Comments)   "woozy"   Penicillins Other (See Comments)   Dizziness Tolerated augmentin but does not like taste        Medication List     STOP taking these medications    aspirin 81 MG EC tablet   benazepril 40 MG tablet Commonly known as: LOTENSIN   hydrochlorothiazide 25 MG tablet Commonly known as: HYDRODIURIL   lovastatin 20 MG tablet Commonly known as: MEVACOR   meloxicam 15 MG tablet Commonly known as: MOBIC   Synjardy XR 12.02-999 MG Tb24 Generic drug: Empagliflozin-metFORMIN HCl ER       TAKE these medications    apixaban 5 MG Tabs tablet Commonly known as: ELIQUIS Take 1 tablet (5 mg total) by mouth 2 (two) times daily.   Calcium 600+D3 600-5 MG-MCG Tabs Generic drug: Calcium Carb-Cholecalciferol Take 1 tablet by mouth daily.   carvedilol 12.5 MG tablet Commonly known as: COREG Take 1 tablet (12.5 mg total) by mouth 2 (two) times daily with  a meal. What changed:  medication strength how much to take   cephALEXin 500 MG capsule Commonly known as: KEFLEX Take 1 capsule (500 mg total) by mouth 4 (four) times daily -  with meals and at bedtime for 4 days.   diltiazem 180 MG 24 hr capsule Commonly known as: CARDIZEM CD Take 1 capsule (180 mg total) by mouth daily.   multivitamin tablet Take 1 tablet by mouth daily.   omeprazole 20 MG capsule Commonly known as: PRILOSEC Take 20 mg by mouth daily.   sodium chloride flush 0.9 % Soln Commonly  known as: NS 5 mLs by Intracatheter route every 8 (eight) hours.   traZODone 50 MG tablet Commonly known as: DESYREL Take 1 tablet (50 mg total) by mouth at bedtime.   Trulicity 3 0000000 Sopn Generic drug: Dulaglutide INJECT 1 SYRINGE SUBCUTANEOUSLY ONCE A WEEK               Durable Medical Equipment  (From admission, onward)           Start     Ordered   11/05/21 1558  For home use only DME 3 n 1  Once        11/05/21 1557   11/05/21 1558  For home use only DME Walker rolling  Once       Question Answer Comment  Walker: With 5 Inch Wheels   Patient needs a walker to treat with the following condition Weakness generalized      11/05/21 1557             DISCHARGE INSTRUCTIONS:   Follow-up PMD 5 days Follow-up cardiology 1 to 2 weeks Follow-up general surgery 3 weeks  If you experience worsening of your admission symptoms, develop shortness of breath, life threatening emergency, suicidal or homicidal thoughts you must seek medical attention immediately by calling 911 or calling your MD immediately  if symptoms less severe.  You Must read complete instructions/literature along with all the possible adverse reactions/side effects for all the Medicines you take and that have been prescribed to you. Take any new Medicines after you have completely understood and accept all the possible adverse reactions/side effects.   Please note  You were cared for by a hospitalist during your hospital stay. If you have any questions about your discharge medications or the care you received while you were in the hospital after you are discharged, you can call the unit and asked to speak with the hospitalist on call if the hospitalist that took care of you is not available. Once you are discharged, your primary care physician will handle any further medical issues. Please note that NO REFILLS for any discharge medications will be authorized once you are discharged, as it is  imperative that you return to your primary care physician (or establish a relationship with a primary care physician if you do not have one) for your aftercare needs so that they can reassess your need for medications and monitor your lab values.    Today   CHIEF COMPLAINT:   Chief Complaint  Patient presents with   Emesis    HISTORY OF PRESENT ILLNESS:  Amanda [name]  is a 66 y.o. female came in initially with vomiting   VITAL SIGNS:  Blood pressure 123/76, pulse (!) 105, temperature 98.5 F (36.9 C), resp. rate 18, height 5' (1.524 m), weight 79 kg, SpO2 100 %.  I/O:  No intake or output data in the 24 hours ending 11/08/21 1524  PHYSICAL EXAMINATION:  GENERAL:  66 y.o.-year-old patient lying in the bed with no acute distress.  EYES: Pupils equal, round, reactive to light and accommodation. No scleral icterus. HEENT: Head atraumatic, normocephalic. Oropharynx and nasopharynx clear.  LUNGS: Normal breath sounds bilaterally, no wheezing, rales,rhonchi or crepitation. No use of accessory muscles of respiration.  CARDIOVASCULAR: S1, S2 normal. No murmurs, rubs, or gallops.  ABDOMEN: Soft, non-tender, non-distended. Bowel sounds present.  EXTREMITIES: No pedal edema.  NEUROLOGIC: Cranial nerves II through XII are intact. Muscle strength 5/5 in all extremities. Sensation intact. Gait not checked.  PSYCHIATRIC: The patient is alert and oriented x 3.  SKIN: No obvious rash, lesion, or ulcer.   DATA REVIEW:   CBC Recent Labs  Lab 11/08/21 0520  WBC 15.4*  HGB 10.7*  HCT 31.2*  PLT 91*    Chemistries  Recent Labs  Lab 11/06/21 0522 11/07/21 0500 11/08/21 0520  NA 138   < > 140  K 3.8   < > 3.6  CL 107   < > 110  CO2 19*   < > 22  GLUCOSE 169*   < > 179*  BUN 62*   < > 45*  CREATININE 1.46*   < > 0.79  CALCIUM 8.1*   < > 7.9*  MG 2.3  --   --   AST 36  --   --   ALT 33  --   --   ALKPHOS 85  --   --   BILITOT 1.1  --   --    < > = values in this interval not  displayed.     Microbiology Results  Results for orders placed or performed during the hospital encounter of 11/02/21  Resp Panel by RT-PCR (Flu A&B, Covid) Nasopharyngeal Swab     Status: None   Collection Time: 11/02/21  9:43 AM   Specimen: Nasopharyngeal Swab; Nasopharyngeal(NP) swabs in vial transport medium  Result Value Ref Range Status   SARS Coronavirus 2 by RT PCR NEGATIVE NEGATIVE Final    Comment: (NOTE) SARS-CoV-2 target nucleic acids are NOT DETECTED.  The SARS-CoV-2 RNA is generally detectable in upper respiratory specimens during the acute phase of infection. The lowest concentration of SARS-CoV-2 viral copies this assay can detect is 138 copies/mL. A negative result does not preclude SARS-Cov-2 infection and should not be used as the sole basis for treatment or other patient management decisions. A negative result may occur with  improper specimen collection/handling, submission of specimen other than nasopharyngeal swab, presence of viral mutation(s) within the areas targeted by this assay, and inadequate number of viral copies(<138 copies/mL). A negative result must be combined with clinical observations, patient history, and epidemiological information. The expected result is Negative.  Fact Sheet for Patients:  EntrepreneurPulse.com.au  Fact Sheet for Healthcare Providers:  IncredibleEmployment.be  This test is no t yet approved or cleared by the [place] FDA and  has been authorized for detection and/or diagnosis of SARS-CoV-2 by FDA under an Emergency Use Authorization (EUA). This EUA will remain  in effect (meaning this test can be used) for the duration of the COVID-19 declaration under Section 564(b)(1) of the Act, 21 U.S.C.section 360bbb-3(b)(1), unless the authorization is terminated  or revoked sooner.       Influenza A by PCR NEGATIVE NEGATIVE Final   Influenza B by PCR NEGATIVE NEGATIVE Final     Comment: (NOTE) The Xpert Xpress SARS-CoV-2/FLU/RSV plus assay is intended as an aid in the diagnosis of influenza  from Nasopharyngeal swab specimens and should not be used as a sole basis for treatment. Nasal washings and aspirates are unacceptable for Xpert Xpress SARS-CoV-2/FLU/RSV testing.  Fact Sheet for Patients: EntrepreneurPulse.com.au  Fact Sheet for Healthcare Providers: IncredibleEmployment.be  This test is not yet approved or cleared by the [place] FDA and has been authorized for detection and/or diagnosis of SARS-CoV-2 by FDA under an Emergency Use Authorization (EUA). This EUA will remain in effect (meaning this test can be used) for the duration of the COVID-19 declaration under Section 564(b)(1) of the Act, 21 U.S.C. section 360bbb-3(b)(1), unless the authorization is terminated or revoked.  Performed at [facility], [address] [place], [place] [address]   Blood culture (routine x 2)     Status: Abnormal   Collection Time: 11/02/21  9:46 AM   Specimen: BLOOD RIGHT WRIST  Result Value Ref Range Status   Specimen Description   Final    BLOOD RIGHT WRIST Performed at [facility], [address], [place], [place] [address]    Special Requests   Final    BOTTLES DRAWN AEROBIC AND ANAEROBIC Blood Culture adequate volume Performed at [facility], [address], [place], [place] [address]    Culture  Setup Time   Final    GRAM NEGATIVE RODS IN BOTH AEROBIC AND ANAEROBIC BOTTLES CRITICAL VALUE NOTED.  VALUE IS CONSISTENT WITH PREVIOUSLY REPORTED AND CALLED VALUE. Performed at [facility], [address] [place], [place] [address]    Culture (A)  Final    ESCHERICHIA COLI SUSCEPTIBILITIES PERFORMED ON PREVIOUS CULTURE WITHIN THE LAST 5 DAYS. Performed at [facility], [place] [address] [place], [place] [address]    Report Status 11/05/2021 FINAL  Final  Blood culture  (routine x 2)     Status: Abnormal   Collection Time: 11/02/21  9:46 AM   Specimen: BLOOD LEFT ARM  Result Value Ref Range Status   Specimen Description   Final    BLOOD LEFT ARM Performed at [facility], [address] [place], [place] [address]    Special Requests   Final    BOTTLES DRAWN AEROBIC AND ANAEROBIC Blood Culture adequate volume Performed at [facility], [address] [place], [place] [address]    Culture  Setup Time   Final    Organism ID to follow GRAM NEGATIVE RODS IN BOTH AEROBIC AND ANAEROBIC BOTTLES CRITICAL RESULT CALLED TO, READ BACK BY AND VERIFIED WITH: [place] ON 11/02/21 BY SS Performed at [facility], [address] [place], [place] [address]    Culture ESCHERICHIA COLI (A)  Final   Report Status 11/05/2021 FINAL  Final   Organism ID, Bacteria ESCHERICHIA COLI  Final      Susceptibility   Escherichia coli - MIC*    AMPICILLIN <=2 SENSITIVE Sensitive     CEFAZOLIN <=4 SENSITIVE Sensitive     CEFEPIME <=0.12 SENSITIVE Sensitive     CEFTAZIDIME <=1 SENSITIVE Sensitive     CEFTRIAXONE <=0.25 SENSITIVE Sensitive     CIPROFLOXACIN <=0.25 SENSITIVE Sensitive     GENTAMICIN <=1 SENSITIVE Sensitive     IMIPENEM <=0.25 SENSITIVE Sensitive     TRIMETH/SULFA <=20 SENSITIVE Sensitive     AMPICILLIN/SULBACTAM <=2 SENSITIVE Sensitive     PIP/TAZO <=4 SENSITIVE Sensitive     * ESCHERICHIA COLI  Blood Culture ID Panel (Reflexed)     Status: Abnormal   Collection Time: 11/02/21  9:46 AM  Result Value Ref Range Status   Enterococcus faecalis NOT DETECTED NOT DETECTED Final   Enterococcus Faecium NOT DETECTED NOT DETECTED Final   Listeria monocytogenes NOT DETECTED NOT DETECTED Final   Staphylococcus species NOT DETECTED NOT DETECTED Final   Staphylococcus aureus (BCID) NOT DETECTED NOT DETECTED Final   Staphylococcus epidermidis NOT DETECTED NOT DETECTED Final   Staphylococcus lugdunensis NOT DETECTED NOT DETECTED  Final   Streptococcus species NOT DETECTED NOT DETECTED Final   Streptococcus agalactiae NOT DETECTED NOT DETECTED Final   Streptococcus pneumoniae NOT DETECTED NOT DETECTED Final   Streptococcus pyogenes NOT DETECTED NOT DETECTED Final   A.calcoaceticus-baumannii NOT DETECTED NOT DETECTED Final   Bacteroides fragilis NOT DETECTED NOT DETECTED Final   Enterobacterales DETECTED (A) NOT DETECTED Final    Comment: Enterobacterales represent a large order of gram negative bacteria, not a single organism. CRITICAL RESULT CALLED TO, READ BACK BY AND VERIFIED WITH: [place] ON 11/02/21 BY SS    Enterobacter cloacae complex NOT DETECTED NOT DETECTED Final   Escherichia coli DETECTED (A) NOT DETECTED Final    Comment: CRITICAL RESULT CALLED TO, READ BACK BY AND VERIFIED WITH: [place] ON 11/02/21 BY SS    Klebsiella aerogenes NOT DETECTED NOT DETECTED Final   Klebsiella oxytoca NOT DETECTED NOT DETECTED Final   Klebsiella pneumoniae NOT DETECTED NOT DETECTED Final   Proteus species NOT DETECTED NOT DETECTED Final   Salmonella species NOT DETECTED NOT DETECTED Final   Serratia marcescens NOT DETECTED NOT DETECTED Final   Haemophilus influenzae NOT DETECTED NOT DETECTED Final   Neisseria meningitidis NOT DETECTED NOT DETECTED Final   Pseudomonas aeruginosa NOT DETECTED NOT DETECTED Final   Stenotrophomonas maltophilia NOT DETECTED NOT DETECTED Final   Candida albicans NOT DETECTED NOT DETECTED Final   Candida auris NOT DETECTED NOT DETECTED Final   Candida glabrata NOT DETECTED NOT DETECTED Final   Candida krusei NOT DETECTED NOT DETECTED Final   Candida parapsilosis NOT DETECTED NOT DETECTED Final   Candida tropicalis NOT DETECTED NOT DETECTED Final   Cryptococcus neoformans/gattii NOT DETECTED NOT DETECTED Final   CTX-M ESBL NOT DETECTED NOT DETECTED Final   Carbapenem resistance IMP NOT DETECTED NOT DETECTED Final   Carbapenem resistance KPC NOT DETECTED NOT DETECTED  Final   Carbapenem resistance NDM NOT DETECTED NOT DETECTED Final   Carbapenem resist OXA 48 LIKE NOT DETECTED NOT DETECTED Final   Carbapenem resistance VIM NOT DETECTED NOT DETECTED Final    Comment: Performed at [facility], [address] [place], [place] [address]  Aerobic/Anaerobic Culture w Gram Stain (surgical/deep wound)     Status: None   Collection Time: 11/02/21  2:18 PM   Specimen: Gallbladder; Bile  Result Value Ref Range Status   Specimen Description   Final    GALL BLADDER Performed at [facility], [address], [place], [place] [address]    Special Requests   Final    NONE Performed at [facility], [address] [place], [place] [address]    Gram Stain   Final    FEW WBC PRESENT,BOTH PMN AND MONONUCLEAR FEW GRAM NEGATIVE RODS    Culture   Final    ABUNDANT ESCHERICHIA COLI NO ANAEROBES ISOLATED Performed at [facility], [place] [address], [place], [place] [address]    Report Status 11/07/2021 FINAL  Final   Organism ID, Bacteria ESCHERICHIA COLI  Final      Susceptibility   Escherichia coli -  MIC*    AMPICILLIN <=2 SENSITIVE Sensitive     CEFAZOLIN <=4 SENSITIVE Sensitive     CEFEPIME <=0.12 SENSITIVE Sensitive     CEFTAZIDIME <=1 SENSITIVE Sensitive     CEFTRIAXONE <=0.25 SENSITIVE Sensitive     CIPROFLOXACIN <=0.25 SENSITIVE Sensitive     GENTAMICIN <=1 SENSITIVE Sensitive     IMIPENEM <=0.25 SENSITIVE Sensitive     TRIMETH/SULFA <=20 SENSITIVE Sensitive     AMPICILLIN/SULBACTAM <=2 SENSITIVE Sensitive     PIP/TAZO <=4 SENSITIVE Sensitive     * ABUNDANT ESCHERICHIA COLI  MRSA Next Gen by PCR, Nasal     Status: None   Collection Time: 11/02/21  4:41 PM   Specimen: Nasal Mucosa; Nasal Swab  Result Value Ref Range Status   MRSA by PCR Next Gen NOT DETECTED NOT DETECTED Final    Comment: (NOTE) The GeneXpert MRSA Assay (FDA approved for NASAL specimens only), is one component of a comprehensive MRSA colonization  surveillance program. It is not intended to diagnose MRSA infection nor to guide or monitor treatment for MRSA infections. Test performance is not FDA approved in patients less than 53 years old. Performed at [facility], [address] [place], [place] [address]   Urine Culture     Status: Abnormal   Collection Time: 11/02/21  4:56 PM   Specimen: In/Out Cath Urine  Result Value Ref Range Status   Specimen Description   Final    IN/OUT CATH URINE Performed at [facility], [address] [place], [place] [address]    Special Requests   Final    NONE Performed at [facility], [place], [place] [address]    Culture 3,000 COLONIES/mL ESCHERICHIA COLI (A)  Final   Report Status 11/05/2021 FINAL  Final   Organism ID, Bacteria ESCHERICHIA COLI (A)  Final      Susceptibility   Escherichia coli - MIC*    AMPICILLIN <=2 SENSITIVE Sensitive     CEFAZOLIN <=4 SENSITIVE Sensitive     CEFEPIME <=0.12 SENSITIVE Sensitive     CEFTRIAXONE <=0.25 SENSITIVE Sensitive     CIPROFLOXACIN <=0.25 SENSITIVE Sensitive     GENTAMICIN <=1 SENSITIVE Sensitive     IMIPENEM <=0.25 SENSITIVE Sensitive     NITROFURANTOIN <=16 SENSITIVE Sensitive     TRIMETH/SULFA <=20 SENSITIVE Sensitive     AMPICILLIN/SULBACTAM <=2 SENSITIVE Sensitive     PIP/TAZO <=4 SENSITIVE Sensitive     * 3,000 COLONIES/mL ESCHERICHIA COLI     Management plans discussed with the patient, family and they are in agreement.  CODE STATUS:     Code Status Orders  (From admission, onward)           Start     Ordered   11/02/21 1410  Full code  Continuous        11/02/21 1409           Code Status History     This patient has a current code status but no historical code status.       TOTAL TIME TAKING CARE OF THIS PATIENT: 35 minutes.    Loletha Grayer M.D on 11/08/2021 at 3:24 PM   [facility]  CC: Primary care physician; Charlynne Cousins, MD

## 2021-11-08 NOTE — Progress Notes (Signed)
Pt discharged per MD order. IV removed. Discharge instructions reviewed with pt and family. RN demonstrated care of the bili drain, including how to flush it, empty and record the amount. Pt handed a printed script to get flushes from the pharmacy. All pt and families questions answered to satisfaction. Pt taken downstairs in wheelchair by staff.

## 2021-11-08 NOTE — Progress Notes (Signed)
[name] SUR[facility]ICAL PROGRESS NOTE (cpt [phone])  Hospital Day(s): 6.   Interval History: Patient seen and examined, no acute events or new complaints overnight. Patient reports this morning she continues to do well. No abdominal pain, fever, chills, nausea, emesis. She stayed yesterday secondary to diarrhea but this is subjectively resolved this morning. She had a slight bump in leukocytosis yesterday but this is improved to 15.4K this morning. Renal function is normal with sCr - 0.79. Cholecystostomy output is not recorded. Cx grew pan-sensitive E coli. Switched to Keflex yesterday. No other issues.   Review of Systems:  Constitutional: denies fever, chills  HEENT: denies cough or congestion  Respiratory: denies any shortness of breath  Cardiovascular: denies chest pain or palpitations  Gastrointestinal: denies abdominal pain, N/V, Genitourinary: denies burning with urination or urinary frequency  Vital signs in last 24 hours: [min-max] current  Temp:  [98.5 F (36.9 C)-98.6 F (37 C)] 98.6 F (37 C) (01/18 1935) Pulse Rate:  [63-74] 74 (01/18 1935) Resp:  [17-20] 17 (01/18 1935) BP: (122-157)/(69-95) 122/69 (01/18 1935) SpO2:  [97 %-99 %] 97 % (01/18 1935)     Height: 5' (152.4 cm) Weight: 79 kg BMI (Calculated): 34.01   Intake/Output last 2 shifts:  01/18 0701 - 01/19 0700 In: 240 [P.O.:240] Out: -    Physical Exam:  Constitutional: alert, cooperative and no distress  HENT: normocephalic without obvious abnormality  Eyes: PERRL, EOM's grossly intact and symmetric  Respiratory: breathing non-labored at rest  Cardiovascular: regular rate and sinus rhythm  Gastrointestinal: Soft, no appreciable tenderness, non-distended, no rebound/guarding. Percutaneous cholecystostomy tube in the RUQ; site is CDI; output is thin and bilious appearing Musculoskeletal: no edema or wounds, motor and sensation grossly intact, NT    Labs:  CBC Latest Ref Rng & Units 11/08/2021  11/07/2021 11/06/2021  WBC 4.0 - 10.5 K/uL 15.4(H) 16.8(H) 13.5(H)  Hemoglobin 12.0 - 15.0 g/dL 10.7(L) 10.6(L) 10.2(L)  Hematocrit 36.0 - 46.0 % 31.2(L) 30.8(L) 29.2(L)  Platelets 150 - 400 K/uL 91(L) 76(L) 62(L)   CMP Latest Ref Rng & Units 11/08/2021 11/07/2021 11/06/2021  Glucose 70 - 99 mg/dL 179(H) 171(H) 169(H)  BUN 8 - 23 mg/dL 45(H) 55(H) 62(H)  Creatinine 0.44 - 1.00 mg/dL 0.79 1.04(H) 1.46(H)  Sodium 135 - 145 mmol/L 140 137 138  Potassium 3.5 - 5.1 mmol/L 3.6 3.0(L) 3.8  Chloride 98 - 111 mmol/L 110 108 107  CO2 22 - 32 mmol/L 22 21(L) 19(L)  Calcium 8.9 - 10.3 mg/dL 7.9(L) 8.0(L) 8.1(L)  Total Protein 6.5 - 8.1 g/dL - - 5.6(L)  Total Bilirubin 0.3 - 1.2 mg/dL - - 1.1  Alkaline Phos 38 - 126 U/L - - 85  AST 15 - 41 U/L - - 36  ALT 0 - 44 U/L - - 33     Imaging studies: No new pertinent imaging studies   Assessment/Plan: (ICD-10's: K81.0) 66 y.o. female with resolved sepsis likely secondary to acute cholecystitis and concomitant urosepsis.                - Okay to continue diet as tolerated - Continue Abx (now Keflex); will need PO Abx for home             - Continue percutaneous cholecystostomy tube; monitor and record output; she will need to maintain this for 6-8 weeks then be re-evaluated for interval cholecystectomy              - Monitor abdominal examination; on-going bowel function              -  Pain control prn; antiemetics prn - Further management per primary service; we will follow   - Discharge Planning;  She can follow up in 2-3 weeks in clinic. She will need Abx for home.     All of the above findings and recommendations were discussed with the patient, and the medical team, and all of patient's questions were answered to her expressed satisfaction.  -- Edison Simon, PA-C [facility] 11/08/2021, 8:08 AM [phone] M-F: 7am - 4pm

## 2021-11-08 NOTE — Progress Notes (Signed)
[facility] Encounter Note  Patient: Amanda [name] / Admit Date: 11/02/2021 / Date of Encounter: 11/08/2021, 8:34 AM   Subjective: 66 y.o. female with past medical history of hypertension, hyperlipidemia, type 2 diabetes currently admitted to the hospital with septic shock secondary to acute cholecystitis, not currently requiring pressors for blood pressure support. Patient went into new onset atrial fibrillation on 11/05/2021 which was subsequently rate controlled with Cardizem.    She has had much better HR control after switching to Cardizem CD 180 mg daily yesterday and Carvedilol 12.5 mg BID. She remains asymptomatic with no complaints of SOB, fatigue, weakness, palpitations.   Echocardiogram showed normal left ventricular systolic function with an EF of 60-65%, normal RV systolic function with mildly dilated left atrium.  No significant valvular abnormalities.  Review of Systems: Positive YHO:OILN Negative for: Vision change, hearing change, syncope, dizziness, nausea, vomiting,diarrhea, bloody stool, stomach pain, cough, congestion, diaphoresis, urinary frequency, urinary pain,skin lesions, skin rashes Others previously listed  Objective: Telemetry: atrial fibrillation Physical Exam: Blood pressure 122/69, pulse 74, temperature 98.6 F (37 C), temperature source Oral, resp. rate 17, height 5' (1.524 m), weight 79 kg, SpO2 97 %. Body mass index is 34.01 kg/m. General: Well developed, well nourished, in no acute distress. Head: Normocephalic, atraumatic, sclera non-icteric, no xanthomas, nares are without discharge. Neck: No apparent masses Lungs: Normal respirations with no wheezes, no rhonchi, no rales , no crackles   Heart: Irregular rate and rhythm, normal S1 S2, no murmur, no rub, no gallop, PMI is normal size and placement, carotid upstroke normal without bruit, jugular venous pressure normal Abdomen: Soft, non-tender, non-distended with normoactive bowel  sounds. No hepatosplenomegaly. Abdominal aorta is normal size without bruit Extremities: No edema, no clubbing, no cyanosis, no ulcers,  Peripheral: 2+ radial, 2+ femoral, 2+ dorsal pedal pulses Neuro: Alert and oriented. Moves all extremities spontaneously. Psych:  Responds to questions appropriately with a normal affect.   Intake/Output Summary (Last 24 hours) at 11/08/2021 0834 Last data filed at 11/07/2021 0900 Gross per 24 hour  Intake 240 ml  Output --  Net 240 ml     Inpatient Medications:   apixaban  5 mg Oral BID   carvedilol  12.5 mg Oral BID WC   cephALEXin  500 mg Oral TID WC & HS   Chlorhexidine Gluconate Cloth  6 each Topical Daily   diltiazem  180 mg Oral Daily   insulin aspart  0-20 Units Subcutaneous TID WC   mouth rinse  15 mL Mouth Rinse BID   sodium chloride flush  5 mL Intracatheter Q8H   traZODone  50 mg Oral QHS   Infusions:   Labs: Recent Labs    11/06/21 0522 11/07/21 0500 11/08/21 0520  NA 138 137 140  K 3.8 3.0* 3.6  CL 107 108 110  CO2 19* 21* 22  GLUCOSE 169* 171* 179*  BUN 62* 55* 45*  CREATININE 1.46* 1.04* 0.79  CALCIUM 8.1* 8.0* 7.9*  MG 2.3  --   --     Recent Labs    11/06/21 0522  AST 36  ALT 33  ALKPHOS 85  BILITOT 1.1  PROT 5.6*  ALBUMIN 2.9*    Recent Labs    11/07/21 0500 11/08/21 0520  WBC 16.8* 15.4*  HGB 10.6* 10.7*  HCT 30.8* 31.2*  MCV 85.1 86.4  PLT 76* 91*    No results for input(s): CKTOTAL, CKMB, TROPONINI in the last 72 hours. Invalid input(s): POCBNP No results for  input(s): HGBA1C in the last 72 hours.    Weights: Filed Weights   11/03/21 0500 11/04/21 0500 11/05/21 0455  Weight: 79.5 kg 79.7 kg 79 kg     Radiology/Studies:  CT ABDOMEN PELVIS WO CONTRAST  Result Date: 11/02/2021 CLINICAL DATA:  Nausea, vomiting, diarrhea, acute kidney injury, vomiting and chills for 3 days EXAM: CT ABDOMEN AND PELVIS WITHOUT CONTRAST TECHNIQUE: Multidetector CT imaging of the abdomen and  pelvis was performed following the standard protocol without IV contrast. COMPARISON:  None. FINDINGS: Lower chest: Trace left pleural effusion. Coronary artery atherosclerosis. Hepatobiliary: No focal hepatic mass. Distended gallbladder with cholelithiasis. Gallbladder wall thickening. No intrahepatic or extrahepatic biliary ductal dilatation. Pancreas: Unremarkable. No pancreatic ductal dilatation or surrounding inflammatory changes. Spleen: Normal in size without focal abnormality. Adrenals/Urinary Tract: 2.4 cm left adrenal mass measuring 21 Hounsfield units of indeterminate etiology. 2.6 cm right adrenal mass of indeterminate etiology measuring 24 Hounsfield units. No urolithiasis. Mild left hydroureteronephrosis. Bilateral perinephric fat stranding. Normal bladder. Stomach/Bowel: Stomach is within normal limits. Appendix appears normal. No evidence of bowel wall thickening, distention, or inflammatory changes. Multiple fluid-filled loops of small bowel. Vascular/Lymphatic: Aortic atherosclerosis. No enlarged abdominal or pelvic lymph nodes. Reproductive: Uterus and bilateral adnexa are unremarkable. Other: No abdominal wall hernia or abnormality. No abdominopelvic ascites. Musculoskeletal: No acute osseous abnormality. No aggressive osseous lesion. Degenerative disease with disc height loss and facet arthropathy throughout the thoracolumbar spine. IMPRESSION: 1. Cholelithiasis with gallbladder distension and gallbladder wall thickening concerning for acute cholecystitis. 2. 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 Recommend adrenal washout CT or chemical shift MRI. JACR 2017 Aug; 14(8):1038-44, JCAT 2016 Mar-Apr; 40(2):194-200, Urol J 2006 Spring; 3(2):71-4. 3.  Aortic Atherosclerosis (ICD10-I70.0). Electronically Signed   By: Elige Ko M.D.   On: 11/02/2021 12:04   DG Chest 1 View  Result Date: 11/02/2021 CLINICAL DATA:   Right-sided central line placement EXAM: CHEST  1 VIEW COMPARISON:  Chest radiograph 11/02/2021 FINDINGS: There is a new right IJ central venous catheter terminating at the cavoatrial junction. The cardiomediastinal silhouette is stable. Lung aeration is unchanged, with no new or worsening focal airspace disease. There is no pleural effusion or pneumothorax There is no acute osseous abnormality. IMPRESSION: New right IJ central venous catheter terminating at the cavoatrial junction. No pneumothorax. Electronically Signed   By: Lesia Hausen M.D.   On: 11/02/2021 13:34   CT HEAD WO CONTRAST ( )  Result Date: 11/02/2021 CLINICAL DATA:  Vomiting and chills for 3 days.  Head trauma. EXAM: CT HEAD WITHOUT CONTRAST TECHNIQUE: Contiguous axial images were obtained from the base of the skull through the vertex without intravenous contrast. RADIATION DOSE REDUCTION: This exam was performed according to the departmental dose-optimization program which includes automated exposure control, adjustment of the mA and/or kV according to patient size and/or use of iterative reconstruction technique. COMPARISON:  None. FINDINGS: Brain: No evidence of acute infarction, hemorrhage, hydrocephalus, extra-axial collection or mass lesion/mass effect. Vascular: No hyperdense vessel or unexpected calcification. Skull: No osseous abnormality. Sinuses/Orbits: Opacification of the right maxillary sinus. Visualized mastoid sinuses are clear. Visualized orbits demonstrate no focal abnormality. Other: None IMPRESSION: No acute intracranial pathology. Electronically Signed   By: Elige Ko M.D.   On: 11/02/2021 12:07   IR Perc Cholecystostomy  Result Date: 11/02/2021 INDICATION: Acute cholecystitis with sepsis EXAM: 1. Ultrasound-guided puncture of the gallbladder via a subcostal transhepatic approach 2. Cholecystogram 3. Placement of a percutaneous  cholecystostomy tube using fluoroscopic guidance MEDICATIONS: Patient received IV  antibiotics per sepsis protocol prior to arrival to the IR suite ANESTHESIA/SEDATION: Moderate (conscious) sedation was employed during this procedure. A total of Versed 1 mg and Fentanyl 50 mcg was administered intravenously by the radiology nurse. Total intra-service moderate Sedation Time: 14 minutes. The patient's level of consciousness and vital signs were monitored continuously by radiology nursing throughout the procedure under my direct supervision. FLUOROSCOPY TIME:  Fluoroscopy Time: 1 minutes 54 seconds (26 mGy). COMPLICATIONS: None immediate. PROCEDURE: Informed written consent was obtained from the patient after a thorough discussion of the procedural risks, benefits and alternatives. All questions were addressed. Maximal Sterile Barrier Technique was utilized including caps, mask, sterile gowns, sterile gloves, sterile drape, hand hygiene and skin antiseptic. A timeout was performed prior to the initiation of the procedure. The patient was placed supine on the exam table. The right upper quadrant was prepped and draped in the standard sterile fashion. Ultrasound was used to evaluate the gallbladder and select an appropriate skin entry site. Ultrasound demonstrated a gallbladder with thickened walls with internal sludge and gallstones. Skin entry site was marked and a subcostal location. Using ultrasound guidance, access was obtained into the gallbladder using a 21 gauge Chiba needle via a subcostal transhepatic approach. Location was confirmed with needle tip in the gallbladder lumen. An 018 wire was advanced into the gallbladder lumen and found to coil within on fluoroscopy. Subsequently, a transition dilator was advanced into the gallbladder lumen. Location within the gallbladder lumen was confirmed with injection of contrast material. Wire was exchanged for an 035 Amplatz wire, and the subcutaneous tract was serially dilated to eventually accommodate a 12 French locking drainage catheter.  Approximately 25 mL of dark thick bile was aspirated with a sample sent to the lab for analysis. Location of the drainage catheter was confirmed with injection of contrast material, which demonstrated a decompressed gallbladder with filling defects consistent with known gallstones. The drainage catheter was secured to skin using silk suture and a dressing. It was attached to bag drainage. The patient tolerated the procedure well, and was transferred in stable condition. IMPRESSION: Successful placement of a 9 French percutaneous cholecystostomy tube using ultrasound and fluoroscopic guidance for the treatment of acute calculus cholecystitis. Drainage catheter to stated bag drainage. Further recommendations regarding surgical candidacy per general surgery. IR will plan for routine tube exchange in approximately 6 weeks if interval cholecystectomy not yet performed. Electronically Signed   By: Olive Bass M.D.   On: 11/02/2021 15:30   DG Chest Port 1 View  Result Date: 11/02/2021 CLINICAL DATA:  Fall onto left side EXAM: PORTABLE CHEST 1 VIEW COMPARISON:  None. FINDINGS: The cardiomediastinal silhouette is normal. There is no focal consolidation or pulmonary edema. There is no pleural effusion or pneumothorax. There is no acute osseous abnormality. IMPRESSION: No radiographic evidence of acute cardiopulmonary process. Electronically Signed   By: Lesia Hausen M.D.   On: 11/02/2021 10:28   DG Shoulder Left Portable  Result Date: 11/02/2021 CLINICAL DATA:  Status post fall, left shoulder pain EXAM: LEFT SHOULDER COMPARISON:  None. FINDINGS: No fracture or dislocation. Mild degenerative changes of the acromioclavicular joint. Soft tissues are normal. IMPRESSION: No acute osseous injury of the left shoulder. Electronically Signed   By: Elige Ko M.D.   On: 11/02/2021 10:26   ECHOCARDIOGRAM COMPLETE  Result Date: 11/06/2021    ECHOCARDIOGRAM REPORT   Patient Name:   Amanda [name] Date of Exam: 11/06/2021  Medical Rec #:  [id number]   Height:       60.0 in Accession #:    [id number]  Weight:       310.8 lb Date of Birth:  [birth year]   BSA:          2.251 m Patient Age:    65 years    BP:           141/71 mmHg Patient Gender: F           HR:           97 bpm. Exam Location:  [facility] Procedure: 2D Echo, Cardiac Doppler and Color Doppler Indications:     Atrial Fibrillation I48.91  History:         Patient has no prior history of Echocardiogram examinations.                  Risk Factors:Hypertension, Diabetes and Dyslipidemia.  Sonographer:     Cristela Blue Referring Phys:  [id number] JENNIFER CHOI Diagnosing Phys: Arnoldo Hooker MD  Sonographer Comments: Suboptimal apical window. IMPRESSIONS  1. Left ventricular ejection fraction, by estimation, is 60 to 65%. The left ventricle has normal function. The left ventricle has no regional wall motion abnormalities. Left ventricular diastolic parameters were normal.  2. Right ventricular systolic function is normal. The right ventricular size is normal.  3. Left atrial size was mildly dilated.  4. The mitral valve is normal in structure. Mild mitral valve regurgitation.  5. The aortic valve is normal in structure. Aortic valve regurgitation is trivial. FINDINGS  Left Ventricle: Left ventricular ejection fraction, by estimation, is 60 to 65%. The left ventricle has normal function. The left ventricle has no regional wall motion abnormalities. The left ventricular internal cavity size was normal in size. There is  no left ventricular hypertrophy. Left ventricular diastolic parameters were normal. Right Ventricle: The right ventricular size is normal. No increase in right ventricular wall thickness. Right ventricular systolic function is normal. Left Atrium: Left atrial size was mildly dilated. Right Atrium: Right atrial size was normal in size. Pericardium: There is no evidence of pericardial effusion. Mitral Valve: The mitral valve is normal in structure. Mild mitral valve  regurgitation. Tricuspid Valve: The tricuspid valve is normal in structure. Tricuspid valve regurgitation is not demonstrated. Aortic Valve: The aortic valve is normal in structure. Aortic valve regurgitation is trivial. Aortic valve mean gradient measures 2.0 mmHg. Aortic valve peak gradient measures 3.5 mmHg. Aortic valve area, by VTI measures 3.40 cm. Pulmonic Valve: The pulmonic valve was normal in structure. Pulmonic valve regurgitation is not visualized. Aorta: The aortic root and ascending aorta are structurally normal, with no evidence of dilitation. IAS/Shunts: No atrial level shunt detected by color flow Doppler.  LEFT VENTRICLE PLAX 2D LVIDd:         3.50 cm LVIDs:         2.00 cm LV PW:         1.20 cm LV IVS:        0.65 cm LVOT diam:     2.00 cm LV SV:         50 LV SV Index:   22 LVOT Area:     3.14 cm  RIGHT VENTRICLE RV Basal diam:  3.60 cm RV S prime:     7.18 cm/s TAPSE (M-mode): 1.0 cm LEFT ATRIUM             Index        RIGHT ATRIUM  Index LA diam:        4.40 cm 1.95 cm/m   RA Area:     18.40 cm LA Vol (A2C):   76.8 ml 34.11 ml/m  RA Volume:   51.60 ml  22.92 ml/m LA Vol (A4C):   65.6 ml 29.14 ml/m LA Biplane Vol: 71.2 ml 31.63 ml/m  AORTIC VALVE                    PULMONIC VALVE AV Area (Vmax):    2.88 cm     PV Vmax:        0.62 m/s AV Area (Vmean):   2.70 cm     PV Vmean:       39.850 cm/s AV Area (VTI):     3.40 cm     PV VTI:         0.098 m AV Vmax:           93.30 cm/s   PV Peak grad:   1.5 mmHg AV Vmean:          68.100 cm/s  PV Mean grad:   1.0 mmHg AV VTI:            0.148 m      RVOT Peak grad: 2 mmHg AV Peak Grad:      3.5 mmHg AV Mean Grad:      2.0 mmHg LVOT Vmax:         85.50 cm/s LVOT Vmean:        58.500 cm/s LVOT VTI:          0.160 m LVOT/AV VTI ratio: 1.08  AORTA Ao Root diam: 2.50 cm MITRAL VALVE                TRICUSPID VALVE MV Area (PHT): 4.26 cm     TR Peak grad:   12.2 mmHg MV Decel Time: 178 msec     TR Vmax:        175.00 cm/s MV E velocity:  113.00 cm/s                             SHUNTS                             Systemic VTI:  0.16 m                             Systemic Diam: 2.00 cm                             Pulmonic VTI:  0.093 m Arnoldo Hooker MD Electronically signed by Arnoldo Hooker MD Signature Date/Time: 11/06/2021/1:26:00 PM    Final      Assessment and Recommendation  66 y.o. female 66 year old female with new onset atrial fibrillation in the setting of sepsis secondary to acute cholecystitis.  Currently not symptomatic and with no evidence of CHF, significant valvular disease by echocardiogram.  Plan: Continue Cardizem CD 180 mg once daily and Carvedilol 12.5 mg BID for heart rate control with atrial fibrillation  Plan to add Eliquis 5 mg BID for anticoagulation now that platelets have improved.  Continue current management of septic shock as per hospitalist Patient stable for discharge at this time, plan to follow up as an outpatient within 1-2 weeks for  further management of atrial fibrillation.    Signed, Maralyn Sago, PA-C

## 2021-11-10 DIAGNOSIS — I248 Other forms of acute ischemic heart disease: Secondary | ICD-10-CM | POA: Diagnosis not present

## 2021-11-10 DIAGNOSIS — E1122 Type 2 diabetes mellitus with diabetic chronic kidney disease: Secondary | ICD-10-CM | POA: Diagnosis not present

## 2021-11-10 DIAGNOSIS — N1831 Chronic kidney disease, stage 3a: Secondary | ICD-10-CM | POA: Diagnosis not present

## 2021-11-10 DIAGNOSIS — A4151 Sepsis due to Escherichia coli [E. coli]: Secondary | ICD-10-CM | POA: Diagnosis not present

## 2021-11-10 DIAGNOSIS — D631 Anemia in chronic kidney disease: Secondary | ICD-10-CM | POA: Diagnosis not present

## 2021-11-10 DIAGNOSIS — K810 Acute cholecystitis: Secondary | ICD-10-CM | POA: Diagnosis not present

## 2021-11-10 DIAGNOSIS — A4159 Other Gram-negative sepsis: Secondary | ICD-10-CM | POA: Diagnosis not present

## 2021-11-10 DIAGNOSIS — K8000 Calculus of gallbladder with acute cholecystitis without obstruction: Secondary | ICD-10-CM | POA: Diagnosis not present

## 2021-11-10 DIAGNOSIS — Z4803 Encounter for change or removal of drains: Secondary | ICD-10-CM | POA: Diagnosis not present

## 2021-11-10 DIAGNOSIS — N390 Urinary tract infection, site not specified: Secondary | ICD-10-CM | POA: Diagnosis not present

## 2021-11-10 DIAGNOSIS — E1159 Type 2 diabetes mellitus with other circulatory complications: Secondary | ICD-10-CM | POA: Diagnosis not present

## 2021-11-12 ENCOUNTER — Telehealth: Payer: Self-pay | Admitting: Internal Medicine

## 2021-11-12 ENCOUNTER — Telehealth: Payer: Self-pay

## 2021-11-12 DIAGNOSIS — A4159 Other Gram-negative sepsis: Secondary | ICD-10-CM | POA: Diagnosis not present

## 2021-11-12 DIAGNOSIS — E1122 Type 2 diabetes mellitus with diabetic chronic kidney disease: Secondary | ICD-10-CM | POA: Diagnosis not present

## 2021-11-12 DIAGNOSIS — N1831 Chronic kidney disease, stage 3a: Secondary | ICD-10-CM | POA: Diagnosis not present

## 2021-11-12 DIAGNOSIS — D631 Anemia in chronic kidney disease: Secondary | ICD-10-CM | POA: Diagnosis not present

## 2021-11-12 DIAGNOSIS — A4151 Sepsis due to Escherichia coli [E. coli]: Secondary | ICD-10-CM | POA: Diagnosis not present

## 2021-11-12 DIAGNOSIS — N390 Urinary tract infection, site not specified: Secondary | ICD-10-CM | POA: Diagnosis not present

## 2021-11-12 DIAGNOSIS — I248 Other forms of acute ischemic heart disease: Secondary | ICD-10-CM | POA: Diagnosis not present

## 2021-11-12 DIAGNOSIS — K8000 Calculus of gallbladder with acute cholecystitis without obstruction: Secondary | ICD-10-CM | POA: Diagnosis not present

## 2021-11-12 DIAGNOSIS — Z4803 Encounter for change or removal of drains: Secondary | ICD-10-CM | POA: Diagnosis not present

## 2021-11-12 DIAGNOSIS — E1159 Type 2 diabetes mellitus with other circulatory complications: Secondary | ICD-10-CM | POA: Diagnosis not present

## 2021-11-12 NOTE — Telephone Encounter (Signed)
Copied from CRM [id number]. Topic: Quick Communication - Home Health Verbal Orders >> Nov 12, 2021  2:23 PM Gaetana Michaelis A wrote: Caller/Agency: [facility] Callback Number: [phone] Requesting OT/PT/Skilled Nursing/Social Work/Speech Therapy: PT Frequency: 2w4 1w5

## 2021-11-12 NOTE — Telephone Encounter (Signed)
Called and LVM giving verbal orders for patient.

## 2021-11-12 NOTE — Telephone Encounter (Signed)
Amanda [name] with [facility] calling to request orders for nursing care for biliary drain. 1 x week x 4 weeks, 1 x every 2 weeks x 6 weeks and 2 prn visits. Please advise. Contact number [phone].

## 2021-11-12 NOTE — Telephone Encounter (Signed)
Routing to provider for verbal orders 

## 2021-11-13 ENCOUNTER — Inpatient Hospital Stay: Payer: Medicare HMO | Admitting: Internal Medicine

## 2021-11-13 ENCOUNTER — Other Ambulatory Visit: Payer: Self-pay

## 2021-11-14 DIAGNOSIS — K8000 Calculus of gallbladder with acute cholecystitis without obstruction: Secondary | ICD-10-CM | POA: Diagnosis not present

## 2021-11-14 DIAGNOSIS — E1159 Type 2 diabetes mellitus with other circulatory complications: Secondary | ICD-10-CM | POA: Diagnosis not present

## 2021-11-14 DIAGNOSIS — E1122 Type 2 diabetes mellitus with diabetic chronic kidney disease: Secondary | ICD-10-CM | POA: Diagnosis not present

## 2021-11-14 DIAGNOSIS — Z4803 Encounter for change or removal of drains: Secondary | ICD-10-CM | POA: Diagnosis not present

## 2021-11-14 DIAGNOSIS — N390 Urinary tract infection, site not specified: Secondary | ICD-10-CM | POA: Diagnosis not present

## 2021-11-14 DIAGNOSIS — A4159 Other Gram-negative sepsis: Secondary | ICD-10-CM | POA: Diagnosis not present

## 2021-11-14 DIAGNOSIS — I248 Other forms of acute ischemic heart disease: Secondary | ICD-10-CM | POA: Diagnosis not present

## 2021-11-14 DIAGNOSIS — N1831 Chronic kidney disease, stage 3a: Secondary | ICD-10-CM | POA: Diagnosis not present

## 2021-11-14 DIAGNOSIS — D631 Anemia in chronic kidney disease: Secondary | ICD-10-CM | POA: Diagnosis not present

## 2021-11-14 DIAGNOSIS — A4151 Sepsis due to Escherichia coli [E. coli]: Secondary | ICD-10-CM | POA: Diagnosis not present

## 2021-11-14 NOTE — Telephone Encounter (Signed)
They just need a verbal order for the orders requested.

## 2021-11-15 NOTE — Telephone Encounter (Signed)
Called and gave verbal order per Jolene.  °

## 2021-11-19 DIAGNOSIS — I480 Paroxysmal atrial fibrillation: Secondary | ICD-10-CM | POA: Diagnosis not present

## 2021-11-19 DIAGNOSIS — I4891 Unspecified atrial fibrillation: Secondary | ICD-10-CM | POA: Diagnosis not present

## 2021-11-19 DIAGNOSIS — I10 Essential (primary) hypertension: Secondary | ICD-10-CM | POA: Diagnosis not present

## 2021-11-20 ENCOUNTER — Telehealth: Payer: Self-pay | Admitting: *Deleted

## 2021-11-20 DIAGNOSIS — N1831 Chronic kidney disease, stage 3a: Secondary | ICD-10-CM | POA: Diagnosis not present

## 2021-11-20 DIAGNOSIS — A4159 Other Gram-negative sepsis: Secondary | ICD-10-CM | POA: Diagnosis not present

## 2021-11-20 DIAGNOSIS — E1159 Type 2 diabetes mellitus with other circulatory complications: Secondary | ICD-10-CM | POA: Diagnosis not present

## 2021-11-20 DIAGNOSIS — E1122 Type 2 diabetes mellitus with diabetic chronic kidney disease: Secondary | ICD-10-CM | POA: Diagnosis not present

## 2021-11-20 DIAGNOSIS — D631 Anemia in chronic kidney disease: Secondary | ICD-10-CM | POA: Diagnosis not present

## 2021-11-20 DIAGNOSIS — N390 Urinary tract infection, site not specified: Secondary | ICD-10-CM | POA: Diagnosis not present

## 2021-11-20 DIAGNOSIS — K8000 Calculus of gallbladder with acute cholecystitis without obstruction: Secondary | ICD-10-CM | POA: Diagnosis not present

## 2021-11-20 DIAGNOSIS — I248 Other forms of acute ischemic heart disease: Secondary | ICD-10-CM | POA: Diagnosis not present

## 2021-11-20 DIAGNOSIS — A4151 Sepsis due to Escherichia coli [E. coli]: Secondary | ICD-10-CM | POA: Diagnosis not present

## 2021-11-20 DIAGNOSIS — Z4803 Encounter for change or removal of drains: Secondary | ICD-10-CM | POA: Diagnosis not present

## 2021-11-20 NOTE — Telephone Encounter (Signed)
I called and spoke with the patient. She denies any abdominal pain. She has eaten this morning, had a normal bowel movement, and has urinated. She reports no fever, chills, or nausea. I instructed the patient to go ahead and flush her drain and to call us back with a report. She is amendable to this and will call back.

## 2021-11-20 NOTE — Telephone Encounter (Signed)
Patient called and was seen in the hospital on 11/02/21 and has a gallbladder drain, she stated that she woke up this morning and it had not drained at all. She has not flushed it yet this morning. She is concerned that something is wrong with the drain.

## 2021-11-20 NOTE — Telephone Encounter (Signed)
Patient called back and said she did flush and drain and got about 5 ml out.  Status from here?  Please call her.  Thank you.

## 2021-11-20 NOTE — Telephone Encounter (Signed)
Called patient and let her know Dr Aleen Campi said to just monitor and to do her flushes and we would see her at her follow up. She is aware to call with any further questions.

## 2021-11-20 NOTE — Telephone Encounter (Signed)
Patient states that the catheter flushed easily. She denies any pain or discomfort while flushing it. I let her know to monitor and let us know if anything changed and that I would let Dr Aleen Campi know what she said.

## 2021-11-21 DIAGNOSIS — A4151 Sepsis due to Escherichia coli [E. coli]: Secondary | ICD-10-CM | POA: Diagnosis not present

## 2021-11-21 DIAGNOSIS — E1159 Type 2 diabetes mellitus with other circulatory complications: Secondary | ICD-10-CM | POA: Diagnosis not present

## 2021-11-21 DIAGNOSIS — A4159 Other Gram-negative sepsis: Secondary | ICD-10-CM | POA: Diagnosis not present

## 2021-11-21 DIAGNOSIS — E1122 Type 2 diabetes mellitus with diabetic chronic kidney disease: Secondary | ICD-10-CM | POA: Diagnosis not present

## 2021-11-21 DIAGNOSIS — Z4803 Encounter for change or removal of drains: Secondary | ICD-10-CM | POA: Diagnosis not present

## 2021-11-21 DIAGNOSIS — D631 Anemia in chronic kidney disease: Secondary | ICD-10-CM | POA: Diagnosis not present

## 2021-11-21 DIAGNOSIS — K8000 Calculus of gallbladder with acute cholecystitis without obstruction: Secondary | ICD-10-CM | POA: Diagnosis not present

## 2021-11-21 DIAGNOSIS — N1831 Chronic kidney disease, stage 3a: Secondary | ICD-10-CM | POA: Diagnosis not present

## 2021-11-21 DIAGNOSIS — I248 Other forms of acute ischemic heart disease: Secondary | ICD-10-CM | POA: Diagnosis not present

## 2021-11-21 DIAGNOSIS — N390 Urinary tract infection, site not specified: Secondary | ICD-10-CM | POA: Diagnosis not present

## 2021-11-22 ENCOUNTER — Encounter: Payer: Self-pay | Admitting: Internal Medicine

## 2021-11-22 ENCOUNTER — Ambulatory Visit (INDEPENDENT_AMBULATORY_CARE_PROVIDER_SITE_OTHER): Payer: Medicare HMO | Admitting: Internal Medicine

## 2021-11-22 ENCOUNTER — Telehealth: Payer: Self-pay | Admitting: Internal Medicine

## 2021-11-22 ENCOUNTER — Other Ambulatory Visit: Payer: Self-pay

## 2021-11-22 VITALS — BP 128/83 | HR 87 | Temp 98.3°F | Ht 60.12 in | Wt 183.0 lb

## 2021-11-22 DIAGNOSIS — K819 Cholecystitis, unspecified: Secondary | ICD-10-CM | POA: Diagnosis not present

## 2021-11-22 DIAGNOSIS — I4891 Unspecified atrial fibrillation: Secondary | ICD-10-CM | POA: Insufficient documentation

## 2021-11-22 DIAGNOSIS — E119 Type 2 diabetes mellitus without complications: Secondary | ICD-10-CM | POA: Diagnosis not present

## 2021-11-22 LAB — BAYER DCA HB A1C WAIVED: HB A1C (BAYER DCA - WAIVED): 7 % — ABNORMAL HIGH (ref 4.8–5.6)

## 2021-11-22 NOTE — Telephone Encounter (Signed)
Per pt's niece, Amanda [name], Trulicity 3MG  has a .  Please contact pt at [phone] or niece, Dawn at [phone] to advise if another medication can be sent in or a different dosage.   Patient will be out of medication on Sunday, 11/25/2021.

## 2021-11-22 NOTE — Progress Notes (Signed)
BP 128/83    Pulse 87    Temp 98.3 F (36.8 C) (Oral)    Ht 5' 0.12" (1.527 m)    Wt 183 lb (83 kg)    SpO2 100%    BMI 35.60 kg/m    Subjective:    Patient ID: Amanda [name], female    DOB: 1955-11-17, 66 y.o.   MRN: [id number]  Chief Complaint  Patient presents with   Hospitalization Follow-up    Afib    HPI: Amanda [name] is a 66 y.o. female  Pt is here for a hosp fu  Patient was admitted for new onset of A. fib/sepsis secondary to E. coli and acute cholecystitis. She had a cholecystectomy and drain placed by IR the hospital on 11/02/2021.  To have an outpatient cholecystectomy per general surgery Of note patient was noted to have new onset of atrial fibrillation which was deemed secondary to her sepsis she was placed on Cardizem and Coreg was added later she was placed on Eliquis secondary to new onset of A. fib. Patient also had acute kidney injury secondary to her sepsis with a creatinine normalizing on discharge. She was noted to have thrombocytopenia which normalized on discharge  Per pt she had dizziness/ had diarrhea and felt ill. She felt " not right " had septic shock and had cholecystitis. Vomitng and diarrhea prior to admission  Diabetes She presents for her follow-up diabetic visit. She has type 2 diabetes mellitus.   Chief Complaint  Patient presents with   Hospitalization Follow-up    Afib    Relevant past medical, surgical, family and social history reviewed and updated as indicated. Interim medical history since our last visit reviewed. Allergies and medications reviewed and updated.  Review of Systems  Per HPI unless specifically indicated above     Objective:    BP 128/83    Pulse 87    Temp 98.3 F (36.8 C) (Oral)    Ht 5' 0.12" (1.527 m)    Wt 183 lb (83 kg)    SpO2 100%    BMI 35.60 kg/m   Wt Readings from Last 3 Encounters:  11/22/21 183 lb (83 kg)  11/05/21 174 lb 2.6 oz (79 kg)  09/03/21 161 lb 3.2 oz (73.1 kg)    Physical Exam Vitals  and nursing note reviewed.  Constitutional:      General: She is not in acute distress.    Appearance: Normal appearance. She is not ill-appearing or diaphoretic.  Eyes:     Conjunctiva/sclera: Conjunctivae normal.  Cardiovascular:     Rate and Rhythm: Tachycardia present. Rhythm irregular.     Heart sounds: No murmur heard.   No friction rub.  Pulmonary:     Effort: Pulmonary effort is normal. No respiratory distress.     Breath sounds: No wheezing, rhonchi or rales.  Chest:     Chest wall: No tenderness.  Abdominal:     General: Abdomen is flat. Bowel sounds are normal. There is no distension.     Palpations: Abdomen is soft. There is no mass.     Tenderness: There is no abdominal tenderness. There is no guarding.  Skin:    General: Skin is warm and dry.     Coloration: Skin is not jaundiced.     Findings: No erythema.  Neurological:     Mental Status: She is alert.     Cranial Nerves: No cranial nerve deficit.     Sensory: No sensory deficit.  Motor: No weakness.     Coordination: Coordination normal.   Results for orders placed or performed in visit on 11/22/21  Bayer DCA Hb A1c Waived (STAT)  Result Value Ref Range   HB A1C (BAYER DCA - WAIVED) 7.0 (H) 4.8 - 5.6 %  CBC with Differential/Platelet  Result Value Ref Range   WBC 5.9 3.4 - 10.8 x10E3/uL   RBC 2.68 (LL) 3.77 - 5.28 x10E6/uL   Hemoglobin 8.0 (L) 11.1 - 15.9 g/dL   Hematocrit 23.2 (L) 34.0 - 46.6 %   MCV 87 79 - 97 fL   MCH 29.9 26.6 - 33.0 pg   MCHC 34.5 31.5 - 35.7 g/dL   RDW 13.0 11.7 - 15.4 %   Platelets 278 150 - 450 x10E3/uL   Neutrophils 69 Not Estab. %   Lymphs 20 Not Estab. %   Monocytes 7 Not Estab. %   Eos 2 Not Estab. %   Basos 0 Not Estab. %   Neutrophils Absolute 4.1 1.4 - 7.0 x10E3/uL   Lymphocytes Absolute 1.2 0.7 - 3.1 x10E3/uL   Monocytes Absolute 0.4 0.1 - 0.9 x10E3/uL   EOS (ABSOLUTE) 0.1 0.0 - 0.4 x10E3/uL   Basophils Absolute 0.0 0.0 - 0.2 x10E3/uL   Immature Granulocytes 2  Not Estab. %   Immature Grans (Abs) 0.1 0.0 - 0.1 x10E3/uL  Comprehensive metabolic panel  Result Value Ref Range   Glucose 164 (H) 70 - 99 mg/dL   BUN 12 8 - 27 mg/dL   Creatinine, Ser 0.93 0.57 - 1.00 mg/dL   eGFR 68 >59 mL/min/1.73   BUN/Creatinine Ratio 13 12 - 28   Sodium 137 134 - 144 mmol/L   Potassium 4.7 3.5 - 5.2 mmol/L   Chloride 101 96 - 106 mmol/L   CO2 22 20 - 29 mmol/L   Calcium 8.4 (L) 8.7 - 10.3 mg/dL   Total Protein 5.9 (L) 6.0 - 8.5 g/dL   Albumin 3.0 (L) 3.8 - 4.8 g/dL   Globulin, Total 2.9 1.5 - 4.5 g/dL   Albumin/Globulin Ratio 1.0 (L) 1.2 - 2.2   Bilirubin Total 0.4 0.0 - 1.2 mg/dL   Alkaline Phosphatase 110 44 - 121 IU/L   AST 19 0 - 40 IU/L   ALT 16 0 - 32 IU/L        Current Outpatient Medications:    apixaban (ELIQUIS) 5 MG TABS tablet, Take 1 tablet (5 mg total) by mouth 2 (two) times daily., Disp: 60 tablet, Rfl: 0   Calcium Carb-Cholecalciferol (CALCIUM 600+D3) 600-200 MG-UNIT TABS, Take 1 tablet by mouth daily., Disp: , Rfl:    carvedilol (COREG) 12.5 MG tablet, Take 1 tablet (12.5 mg total) by mouth 2 (two) times daily with a meal., Disp: 60 tablet, Rfl: 0   diltiazem (CARDIZEM CD) 180 MG 24 hr capsule, Take 1 capsule (180 mg total) by mouth daily., Disp: 30 capsule, Rfl: 0   Multiple Vitamin (MULTIVITAMIN) tablet, Take 1 tablet by mouth daily., Disp: , Rfl:    omeprazole (PRILOSEC) 20 MG capsule, Take 20 mg by mouth daily., Disp: , Rfl:    sodium chloride flush (NS) 0.9 % SOLN, 5 mLs by Intracatheter route every 8 (eight) hours., Disp: 450 mL, Rfl: 1   traZODone (DESYREL) 50 MG tablet, Take 1 tablet (50 mg total) by mouth at bedtime., Disp: 30 tablet, Rfl: 0   Dulaglutide (TRULICITY) 3 UU/8.2CM SOPN, INJECT 1 SYRINGE SUBCUTANEOUSLY ONCE A WEEK Strength: 3 MG/0.5ML, Disp: 12 mL, Rfl: 0  vitamin B-12 (CYANOCOBALAMIN) 500 MCG tablet, Take by mouth., Disp: , Rfl:     Assessment & Plan:  1. New onset Atrial fibrillation on Coreg 12.5 mg  daily, Eliquis 5 mg daily, diltiazem 180 mg every 24 hourly. Stable fu and mx per cards  2.  Diabetes type 2 on Trulicity 3 mg daily. A1c at 7.0 stable. Continue currtent dose and meds.  recheck HbA1c,  urine  microalbumin  diabetic diet plan given to pt  adviced regarding hypoglycemia and instructions given to pt today on how to prevent and treat the same if it were to occur. pt acknowledges the plan and voices understanding of the same.  exercise plan given and encouraged.   advice diabetic yearly podiatry, ophthalmology , nutritionist , dental check q 6 months,   3.  Acute cholecystitis continues to have a bili- drain to follow-up with general surgery patient has been in touch with the nurses regarding her catheter follow-up and management per Dr Hampton Abbot To fu with gen surg  on the 8th of feb ie next week.    Problem List Items Addressed This Visit       Cardiovascular and Mediastinum   Atrial fibrillation ([place])     Digestive   Cholecystitis   Relevant Orders   CBC with Differential/Platelet (Completed)   Comprehensive metabolic panel (Completed)     Endocrine   Diabetes mellitus without complication ([place]) - Primary   Relevant Orders   Bayer DCA Hb A1c Waived (STAT) (Completed)     Orders Placed This Encounter  Procedures   Bayer DCA Hb A1c Waived (STAT)   CBC with Differential/Platelet   Comprehensive metabolic panel     No orders of the defined types were placed in this encounter.    Follow up plan: No follow-ups on file.

## 2021-11-22 NOTE — Telephone Encounter (Signed)
Copied from CRM [id number]. Topic: General - Call Back - No Documentation >> Nov 22, 2021  4:20 PM Randol Kern wrote: Reason for CRM: Pt called and wants to know if PCP has samples of Trulicity  Best contact: [phone]

## 2021-11-23 ENCOUNTER — Other Ambulatory Visit: Payer: Self-pay

## 2021-11-23 DIAGNOSIS — D631 Anemia in chronic kidney disease: Secondary | ICD-10-CM | POA: Diagnosis not present

## 2021-11-23 DIAGNOSIS — A4151 Sepsis due to Escherichia coli [E. coli]: Secondary | ICD-10-CM | POA: Diagnosis not present

## 2021-11-23 DIAGNOSIS — E1122 Type 2 diabetes mellitus with diabetic chronic kidney disease: Secondary | ICD-10-CM | POA: Diagnosis not present

## 2021-11-23 DIAGNOSIS — Z4803 Encounter for change or removal of drains: Secondary | ICD-10-CM | POA: Diagnosis not present

## 2021-11-23 DIAGNOSIS — N390 Urinary tract infection, site not specified: Secondary | ICD-10-CM | POA: Diagnosis not present

## 2021-11-23 DIAGNOSIS — E1159 Type 2 diabetes mellitus with other circulatory complications: Secondary | ICD-10-CM | POA: Diagnosis not present

## 2021-11-23 DIAGNOSIS — K8000 Calculus of gallbladder with acute cholecystitis without obstruction: Secondary | ICD-10-CM | POA: Diagnosis not present

## 2021-11-23 DIAGNOSIS — A4159 Other Gram-negative sepsis: Secondary | ICD-10-CM | POA: Diagnosis not present

## 2021-11-23 DIAGNOSIS — I248 Other forms of acute ischemic heart disease: Secondary | ICD-10-CM | POA: Diagnosis not present

## 2021-11-23 DIAGNOSIS — N1831 Chronic kidney disease, stage 3a: Secondary | ICD-10-CM | POA: Diagnosis not present

## 2021-11-23 LAB — COMPREHENSIVE METABOLIC PANEL
ALT: 16 IU/L (ref 0–32)
AST: 19 IU/L (ref 0–40)
Albumin/Globulin Ratio: 1 — ABNORMAL LOW (ref 1.2–2.2)
Albumin: 3 g/dL — ABNORMAL LOW (ref 3.8–4.8)
Alkaline Phosphatase: 110 IU/L (ref 44–121)
BUN/Creatinine Ratio: 13 (ref 12–28)
BUN: 12 mg/dL (ref 8–27)
Bilirubin Total: 0.4 mg/dL (ref 0.0–1.2)
CO2: 22 mmol/L (ref 20–29)
Calcium: 8.4 mg/dL — ABNORMAL LOW (ref 8.7–10.3)
Chloride: 101 mmol/L (ref 96–106)
Creatinine, Ser: 0.93 mg/dL (ref 0.57–1.00)
Globulin, Total: 2.9 g/dL (ref 1.5–4.5)
Glucose: 164 mg/dL — ABNORMAL HIGH (ref 70–99)
Potassium: 4.7 mmol/L (ref 3.5–5.2)
Sodium: 137 mmol/L (ref 134–144)
Total Protein: 5.9 g/dL — ABNORMAL LOW (ref 6.0–8.5)
eGFR: 68 mL/min/{1.73_m2} (ref >59)

## 2021-11-23 LAB — CBC WITH DIFFERENTIAL/PLATELET
Basophils Absolute: 0 10*3/uL (ref 0.0–0.2)
Basos: 0 %
EOS (ABSOLUTE): 0.1 10*3/uL (ref 0.0–0.4)
Eos: 2 %
Hematocrit: 23.2 % — ABNORMAL LOW (ref 34.0–46.6)
Hemoglobin: 8 g/dL — ABNORMAL LOW (ref 11.1–15.9)
Immature Grans (Abs): 0.1 10*3/uL (ref 0.0–0.1)
Immature Granulocytes: 2 %
Lymphocytes Absolute: 1.2 10*3/uL (ref 0.7–3.1)
Lymphs: 20 %
MCH: 29.9 pg (ref 26.6–33.0)
MCHC: 34.5 g/dL (ref 31.5–35.7)
MCV: 87 fL (ref 79–97)
Monocytes Absolute: 0.4 10*3/uL (ref 0.1–0.9)
Monocytes: 7 %
Neutrophils Absolute: 4.1 10*3/uL (ref 1.4–7.0)
Neutrophils: 69 %
Platelets: 278 10*3/uL (ref 150–450)
RBC: 2.68 x10E6/uL — CL (ref 3.77–5.28)
RDW: 13 % (ref 11.7–15.4)
WBC: 5.9 10*3/uL (ref 3.4–10.8)

## 2021-11-23 MED ORDER — TRULICITY 3 MG/0.5ML ~~LOC~~ SOAJ
SUBCUTANEOUS | 0 refills | Status: DC
  Filled 2021-11-23: qty 2, 28d supply, fill #0

## 2021-11-23 NOTE — Telephone Encounter (Signed)
Pt states she has found she can get her  TRULICITY 3 MG/0.5ML SOPN at [facility].  Pt would like it sent to that pharmacy and Dr Charlotta Newton nurse call her back and let her know she was able to do that.

## 2021-11-23 NOTE — Telephone Encounter (Signed)
Sent to provider via refill request.  

## 2021-11-23 NOTE — Telephone Encounter (Signed)
Please send medication to [facility], patient was able to find medication there.

## 2021-11-28 ENCOUNTER — Ambulatory Visit: Payer: Medicare HMO | Admitting: Surgery

## 2021-11-28 ENCOUNTER — Encounter: Payer: Self-pay | Admitting: Surgery

## 2021-11-28 ENCOUNTER — Telehealth: Payer: Self-pay

## 2021-11-28 ENCOUNTER — Other Ambulatory Visit: Payer: Self-pay

## 2021-11-28 VITALS — BP 105/70 | HR 103 | Temp 98.3°F | Ht 61.0 in | Wt 180.0 lb

## 2021-11-28 DIAGNOSIS — K819 Cholecystitis, unspecified: Secondary | ICD-10-CM

## 2021-11-28 DIAGNOSIS — K8010 Calculus of gallbladder with chronic cholecystitis without obstruction: Secondary | ICD-10-CM

## 2021-11-28 NOTE — Telephone Encounter (Signed)
Cardiac clearance faxed to [facility].

## 2021-11-28 NOTE — Progress Notes (Signed)
11/28/2021  History of Present Illness: Amanda [name] is a 66 y.o. female presenting for follow up of cholecystitis.  She was admitted on 11/02/21 with septic shock with hypotension, bacteremia, AKI, and acute cholecystitis.  She had a percutaneous cholecystostomy drain placed on 1/13.  Her hospital course also had complication of atrial fibrillation and is on two medications for rate control now as well as Eliquis for anticoagulation.  She has seen Dr. Nehemiah Massed with cardiology who has cleared the patient for surgery in the future and is ok with holding Eliquis preoperatively.  Today, the patient reports she's doing better.  She still has some fatigue/weakness and decreased appetite, but she feels both have been slowly improving.  She's trying to drink Glucerna shakes when she's not too hungry.  She is flushing her drain as instructed and reports some days the output is higher and some days is lower.  The output is clearer compared to when she was in the hospital.  She does report still having swelling of her legs/ankles.  Past Medical History: Past Medical History:  Diagnosis Date   Anxiety    Atrial fibrillation ([place])    Cholecystitis    Diabetes mellitus without complication ([place])    Hyperlipidemia    Hypertension      Past Surgical History: Past Surgical History:  Procedure Laterality Date   COLONOSCOPY  2012   IR [facility]C CHOLECYSTOSTOMY  11/02/2021    Home Medications: Prior to Admission medications   Medication Sig Start Date End Date Taking? Authorizing Provider  apixaban (ELIQUIS) 5 MG TABS tablet Take 1 tablet (5 mg total) by mouth 2 (two) times daily. 11/08/21   Loletha Grayer, MD  Calcium Carb-Cholecalciferol (CALCIUM 600+D3) 600-200 MG-UNIT TABS Take 1 tablet by mouth daily.    [provider]  carvedilol (COREG) 12.5 MG tablet Take 1 tablet (12.5 mg total) by mouth 2 (two) times daily with a meal. 11/08/21   Leslye Peer, Richard, MD  diltiazem (CARDIZEM CD) 180 MG 24 hr  capsule Take 1 capsule (180 mg total) by mouth daily. 11/08/21   Loletha Grayer, MD  Dulaglutide (TRULICITY) 3 EX/5.2WU SOPN INJECT 1 SYRINGE SUBCUTANEOUSLY ONCE A WEEK Strength: 3 MG/0.5ML 11/23/21   Vigg, [name], MD  Multiple Vitamin (MULTIVITAMIN) tablet Take 1 tablet by mouth daily.    [provider]  omeprazole (PRILOSEC) 20 MG capsule Take 20 mg by mouth daily.    [provider]  sodium chloride flush (NS) 0.9 % SOLN 5 mLs by Intracatheter route every 8 (eight) hours. 11/06/21 01/05/22  Tylene Fantasia, PA-C  traZODone (DESYREL) 50 MG tablet Take 1 tablet (50 mg total) by mouth at bedtime. 11/08/21   Loletha Grayer, MD  vitamin B-12 (CYANOCOBALAMIN) 500 MCG tablet Take by mouth.    [provider]    Allergies: Allergies  Allergen Reactions   Tramadol Other (See Comments)    "woozy"   Penicillins Other (See Comments)    Dizziness Tolerated augmentin but does not like taste    Review of Systems: Review of Systems  Constitutional:  Positive for malaise/fatigue. Negative for chills and fever.  Respiratory:  Negative for shortness of breath.   Cardiovascular:  Negative for chest pain.  Gastrointestinal:  Positive for abdominal pain. Negative for constipation, diarrhea, nausea and vomiting.  Musculoskeletal:  Negative for myalgias.   Physical Exam BP 105/70    Pulse (!) 103    Temp 98.3 F (36.8 C) (Oral)    Ht _0  (1.549 m)  Wt 180 lb (81.6 kg)    SpO2 97%    BMI 34.01 kg/m  CONSTITUTIONAL: No acute distress, well nourished. HEENT:  Normocephalic, atraumatic, extraocular motion intact. RESPIRATORY:  Lungs are clear, and breath sounds are equal bilaterally. Normal respiratory effort without pathologic use of accessory muscles. CARDIOVASCULAR: Irregular rhythm, regular rate, consistent with atrial fibrillation.  No murmurs. GI: The abdomen is soft, non-distended, with some discomfort in the RUQ at the drain insertion site and surrounding.  . There  were no palpable masses. There was no hepatosplenomegaly. NEUROLOGIC:  Motor and sensation is grossly normal.  Cranial nerves are grossly intact. PSYCH:  Alert and oriented to person, place and time. Affect is normal.  Labs/Imaging: Labs from 11/22/21: Na 137, K 4.7, Cl 101, CO2 22, BUN 12, Cr 0.93.  Total bili 0.4, AST 19, ALT 16, Alk Phos 110.  WBC 5.9, Hgb 8.0, Hct 23.2, Plt 278.  Ha1c 7.0.  CT abdomen/pelvis 11/02/21: IMPRESSION: 1. Cholelithiasis with gallbladder distension and gallbladder wall thickening concerning for acute cholecystitis. 2. 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 Recommend adrenal washout CT or chemical shift MRI. JACR 2017 Aug; 14(8):1038-44, JCAT 2016 Mar-Apr; 40(2):194-200, Urol J 2006 Spring; 3(2):71-4. 3.  Aortic Atherosclerosis (ICD10-I70.0).  Assessment and Plan: This is a 66 y.o. female with recent history of acute cholecystitis, s/p percutaneous cholecystostomy drain on 11/02/21.  --Discussed with the patient that the discomfort that she's having can be related to the drain itself and some residual inflammation from her cholecystitis.  At this point, we still need to wait a few weeks before we can proceed with surgery to allow the inflammatory changes and scarring to improve.  This waiting period will allow her to continue getting good nutrition and improve her energy and ambulation prior to taking another toll on her body with surgery.  This will also allow time to obtain a cholangiogram through the drain to evaluate her gallbladder and the patency of her cystic duct. --Will order cholangiogram for towards the end of February, and she will follow up with me afterwards to discuss surgery for possibly early March time frame.  In the meantime, will also send clearance forms to her PCP and cardiology.    I spent 25 minutes dedicated to the care of this patient on the date of this  encounter to include pre-visit review of records, face-to-face time with the patient discussing diagnosis and management, and any post-visit coordination of care.   Melvyn Neth, [facility]

## 2021-11-28 NOTE — Patient Instructions (Addendum)
Cholangiogram scheduled 12/11/21 @ 11:15. Please [facility] through the [facility] entrance-check in at registration.   Please see your follow up appointment listed below.

## 2021-11-29 DIAGNOSIS — A4159 Other Gram-negative sepsis: Secondary | ICD-10-CM | POA: Diagnosis not present

## 2021-11-29 DIAGNOSIS — N390 Urinary tract infection, site not specified: Secondary | ICD-10-CM | POA: Diagnosis not present

## 2021-11-29 DIAGNOSIS — E1122 Type 2 diabetes mellitus with diabetic chronic kidney disease: Secondary | ICD-10-CM | POA: Diagnosis not present

## 2021-11-29 DIAGNOSIS — A4151 Sepsis due to Escherichia coli [E. coli]: Secondary | ICD-10-CM | POA: Diagnosis not present

## 2021-11-29 DIAGNOSIS — I248 Other forms of acute ischemic heart disease: Secondary | ICD-10-CM | POA: Diagnosis not present

## 2021-11-29 DIAGNOSIS — N1831 Chronic kidney disease, stage 3a: Secondary | ICD-10-CM | POA: Diagnosis not present

## 2021-11-29 DIAGNOSIS — D631 Anemia in chronic kidney disease: Secondary | ICD-10-CM | POA: Diagnosis not present

## 2021-11-29 DIAGNOSIS — Z4803 Encounter for change or removal of drains: Secondary | ICD-10-CM | POA: Diagnosis not present

## 2021-11-29 DIAGNOSIS — K8000 Calculus of gallbladder with acute cholecystitis without obstruction: Secondary | ICD-10-CM | POA: Diagnosis not present

## 2021-11-29 DIAGNOSIS — E1159 Type 2 diabetes mellitus with other circulatory complications: Secondary | ICD-10-CM | POA: Diagnosis not present

## 2021-11-30 ENCOUNTER — Telehealth: Payer: Self-pay | Admitting: Internal Medicine

## 2021-11-30 DIAGNOSIS — E1122 Type 2 diabetes mellitus with diabetic chronic kidney disease: Secondary | ICD-10-CM | POA: Diagnosis not present

## 2021-11-30 DIAGNOSIS — D631 Anemia in chronic kidney disease: Secondary | ICD-10-CM | POA: Diagnosis not present

## 2021-11-30 DIAGNOSIS — N1831 Chronic kidney disease, stage 3a: Secondary | ICD-10-CM | POA: Diagnosis not present

## 2021-11-30 DIAGNOSIS — A4159 Other Gram-negative sepsis: Secondary | ICD-10-CM | POA: Diagnosis not present

## 2021-11-30 DIAGNOSIS — E1159 Type 2 diabetes mellitus with other circulatory complications: Secondary | ICD-10-CM | POA: Diagnosis not present

## 2021-11-30 DIAGNOSIS — A4151 Sepsis due to Escherichia coli [E. coli]: Secondary | ICD-10-CM | POA: Diagnosis not present

## 2021-11-30 DIAGNOSIS — N390 Urinary tract infection, site not specified: Secondary | ICD-10-CM | POA: Diagnosis not present

## 2021-11-30 DIAGNOSIS — K8000 Calculus of gallbladder with acute cholecystitis without obstruction: Secondary | ICD-10-CM | POA: Diagnosis not present

## 2021-11-30 DIAGNOSIS — Z4803 Encounter for change or removal of drains: Secondary | ICD-10-CM | POA: Diagnosis not present

## 2021-11-30 DIAGNOSIS — I248 Other forms of acute ischemic heart disease: Secondary | ICD-10-CM | POA: Diagnosis not present

## 2021-11-30 NOTE — Telephone Encounter (Signed)
Home Health Verbal Orders - Caller/Agency: [facility]  Callback Number: [phone]  Requesting OT/PT/Skilled Nursing/Social Work/Speech Therapy: OT  Frequency: 1w4

## 2021-12-03 ENCOUNTER — Telehealth: Payer: Self-pay

## 2021-12-03 NOTE — Telephone Encounter (Signed)
Received Cardiac Clearance from [place] [facility] -patient optimized for surgery. Discontinue anticoagulant 3 days prior to procedure.

## 2021-12-04 ENCOUNTER — Ambulatory Visit: Payer: Medicare HMO | Admitting: Internal Medicine

## 2021-12-04 DIAGNOSIS — Z4803 Encounter for change or removal of drains: Secondary | ICD-10-CM | POA: Diagnosis not present

## 2021-12-04 DIAGNOSIS — A4151 Sepsis due to Escherichia coli [E. coli]: Secondary | ICD-10-CM | POA: Diagnosis not present

## 2021-12-04 DIAGNOSIS — E1159 Type 2 diabetes mellitus with other circulatory complications: Secondary | ICD-10-CM | POA: Diagnosis not present

## 2021-12-04 DIAGNOSIS — I248 Other forms of acute ischemic heart disease: Secondary | ICD-10-CM | POA: Diagnosis not present

## 2021-12-04 DIAGNOSIS — D631 Anemia in chronic kidney disease: Secondary | ICD-10-CM | POA: Diagnosis not present

## 2021-12-04 DIAGNOSIS — N390 Urinary tract infection, site not specified: Secondary | ICD-10-CM | POA: Diagnosis not present

## 2021-12-04 DIAGNOSIS — N1831 Chronic kidney disease, stage 3a: Secondary | ICD-10-CM | POA: Diagnosis not present

## 2021-12-04 DIAGNOSIS — A4159 Other Gram-negative sepsis: Secondary | ICD-10-CM | POA: Diagnosis not present

## 2021-12-04 DIAGNOSIS — K8000 Calculus of gallbladder with acute cholecystitis without obstruction: Secondary | ICD-10-CM | POA: Diagnosis not present

## 2021-12-04 DIAGNOSIS — E1122 Type 2 diabetes mellitus with diabetic chronic kidney disease: Secondary | ICD-10-CM | POA: Diagnosis not present

## 2021-12-05 ENCOUNTER — Encounter: Payer: Self-pay | Admitting: Internal Medicine

## 2021-12-05 DIAGNOSIS — A4151 Sepsis due to Escherichia coli [E. coli]: Secondary | ICD-10-CM | POA: Diagnosis not present

## 2021-12-05 DIAGNOSIS — E1122 Type 2 diabetes mellitus with diabetic chronic kidney disease: Secondary | ICD-10-CM | POA: Diagnosis not present

## 2021-12-05 DIAGNOSIS — D631 Anemia in chronic kidney disease: Secondary | ICD-10-CM | POA: Diagnosis not present

## 2021-12-05 DIAGNOSIS — Z4803 Encounter for change or removal of drains: Secondary | ICD-10-CM | POA: Diagnosis not present

## 2021-12-05 DIAGNOSIS — A4159 Other Gram-negative sepsis: Secondary | ICD-10-CM | POA: Diagnosis not present

## 2021-12-05 DIAGNOSIS — N390 Urinary tract infection, site not specified: Secondary | ICD-10-CM | POA: Diagnosis not present

## 2021-12-05 DIAGNOSIS — K8000 Calculus of gallbladder with acute cholecystitis without obstruction: Secondary | ICD-10-CM | POA: Diagnosis not present

## 2021-12-05 DIAGNOSIS — I248 Other forms of acute ischemic heart disease: Secondary | ICD-10-CM | POA: Diagnosis not present

## 2021-12-05 DIAGNOSIS — N1831 Chronic kidney disease, stage 3a: Secondary | ICD-10-CM | POA: Diagnosis not present

## 2021-12-05 DIAGNOSIS — E1159 Type 2 diabetes mellitus with other circulatory complications: Secondary | ICD-10-CM | POA: Diagnosis not present

## 2021-12-06 ENCOUNTER — Ambulatory Visit: Payer: Self-pay | Admitting: *Deleted

## 2021-12-06 ENCOUNTER — Other Ambulatory Visit: Payer: Self-pay | Admitting: Family Medicine

## 2021-12-06 ENCOUNTER — Ambulatory Visit (INDEPENDENT_AMBULATORY_CARE_PROVIDER_SITE_OTHER): Payer: Medicare HMO | Admitting: *Deleted

## 2021-12-06 DIAGNOSIS — Z1211 Encounter for screening for malignant neoplasm of colon: Secondary | ICD-10-CM

## 2021-12-06 DIAGNOSIS — Z0000 Encounter for general adult medical examination without abnormal findings: Secondary | ICD-10-CM | POA: Diagnosis not present

## 2021-12-06 MED ORDER — APIXABAN 5 MG PO TABS: 5.0000 mg | ORAL_TABLET | Freq: Two times a day (BID) | ORAL | 1 refills | Status: DC

## 2021-12-06 MED ORDER — CARVEDILOL 12.5 MG PO TABS: 12.5000 mg | ORAL_TABLET | Freq: Two times a day (BID) | ORAL | 1 refills | Status: DC

## 2021-12-06 MED ORDER — DILTIAZEM HCL ER COATED BEADS 180 MG PO CP24: 180.0000 mg | ORAL_CAPSULE | Freq: Every day | ORAL | 1 refills | Status: DC

## 2021-12-06 NOTE — Progress Notes (Signed)
Subjective:   Amanda [name] is a 66 y.o. female who presents for an Initial Medicare Annual Wellness Visit.  I connected with  Morissa A Wailes on 12/06/21 by a telephone enabled telemedicine application and verified that I am speaking with the correct person using two identifiers.   I discussed the limitations of evaluation and management by telemedicine. The patient expressed understanding and agreed to proceed.  Patient location: home  Provider location: Tele-Health not in office    Review of Systems     Cardiac Risk Factors include: advanced age (>39men, >51 women);diabetes mellitus;sedentary lifestyle     Objective:    Today's Vitals   There is no height or weight on file to calculate BMI.  Advanced Directives 12/06/2021 11/02/2021  Does Patient Have a Medical Advance Directive? No No  Would patient like information on creating a medical advance directive? No - Patient declined No - Patient declined    Current Medications (verified) Outpatient Encounter Medications as of 12/06/2021  Medication Sig   apixaban (ELIQUIS) 5 MG TABS tablet Take 1 tablet (5 mg total) by mouth 2 (two) times daily.   Calcium Carb-Cholecalciferol (CALCIUM 600+D3) 600-200 MG-UNIT TABS Take 1 tablet by mouth daily.   carvedilol (COREG) 12.5 MG tablet Take 1 tablet (12.5 mg total) by mouth 2 (two) times daily with a meal.   diltiazem (CARDIZEM CD) 180 MG 24 hr capsule Take 1 capsule (180 mg total) by mouth daily.   Dulaglutide (TRULICITY) 3 MG/0.5ML SOPN INJECT 1 SYRINGE SUBCUTANEOUSLY ONCE A WEEK Strength: 3 MG/0.5ML   Multiple Vitamin (MULTIVITAMIN) tablet Take 1 tablet by mouth daily.   omeprazole (PRILOSEC) 20 MG capsule Take 20 mg by mouth daily.   sodium chloride flush (NS) 0.9 % SOLN 5 mLs by Intracatheter route every 8 (eight) hours.   traZODone (DESYREL) 50 MG tablet Take 1 tablet (50 mg total) by mouth at bedtime.   vitamin B-12 (CYANOCOBALAMIN) 500 MCG tablet Take by mouth.   No  facility-administered encounter medications on file as of 12/06/2021.    Allergies (verified) Tramadol and Penicillins   History: Past Medical History:  Diagnosis Date   Anxiety    Atrial fibrillation (HCC)    Cholecystitis    Diabetes mellitus without complication (HCC)    Hyperlipidemia    Hypertension    Past Surgical History:  Procedure Laterality Date   COLONOSCOPY  2012   IR PERC CHOLECYSTOSTOMY  11/02/2021   Family History  Problem Relation Age of Onset   COPD Mother    Hypertension Mother    Diabetes Mother    Severe combined immunodeficiency Father    Diabetes Sister    Heart disease Sister    Heart disease Sister    Cancer Niece    Social History   Socioeconomic History   Marital status: Single    Spouse name: Not on file   Number of children: Not on file   Years of education: Not on file   Highest education level: Not on file  Occupational History   Not on file  Tobacco Use   Smoking status: Former    Types: Cigarettes    Quit date: 12/07/1975    Years since quitting: 46.0   Smokeless tobacco: Never  Vaping Use   Vaping Use: Never used  Substance and Sexual Activity   Alcohol use: No   Drug use: No   Sexual activity: Not on file  Other Topics Concern   Not on file  Social History Narrative  Not on file   Social Determinants of Health   Financial Resource Strain: Low Risk    Difficulty of Paying Living Expenses: Not hard at all  Food Insecurity: No Food Insecurity   Worried About Programme researcher, broadcasting/film/video in the Last Year: Never true   Barista in the Last Year: Never true  Transportation Needs: Not on file  Physical Activity: Insufficiently Active   Days of Exercise per Week: 3 days   Minutes of Exercise per Session: 20 min  Stress: No Stress Concern Present   Feeling of Stress : Not at all  Social Connections: Unknown   Frequency of Communication with Friends and Family: More than three times a week   Frequency of Social Gatherings  with Friends and Family: Twice a week   Attends Religious Services: Never   Database administrator or Organizations: No   Attends Engineer, structural: Never   Marital Status: Not on file    Tobacco Counseling Counseling given: Not Answered   Clinical Intake:  Pre-visit preparation completed: Yes  Pain : No/denies pain     Nutritional Risks: None Diabetes: Yes CBG done?: No Did pt. bring in CBG monitor from home?: No  How often do you need to have someone help you when you read instructions, pamphlets, or other written materials from your doctor or pharmacy?: 1 - Never  Diabetic?  Yes  Nutrition Risk Assessment:  Has the patient had any N/V/D within the last 2 months?  No  Does the patient have any non-healing wounds?  No  Has the patient had any unintentional weight loss or weight gain?  No   Diabetes:  Is the patient diabetic?  Yes  If diabetic, was a CBG obtained today?  No  Did the patient bring in their glucometer from home?  No  How often do you monitor your CBG's? 2 x day.   Financial Strains and Diabetes Management:  Are you having any financial strains with the device, your supplies or your medication? No .  Does the patient want to be seen by Chronic Care Management for management of their diabetes?  No  Would the patient like to be referred to a Nutritionist or for Diabetic Management?  No   Diabetic Exams:  Diabetic Eye Exam:  Pt has been advised about the importance in completing this exam.  Diabetic Foot Exam:Pt has been advised about the importance in completing this exam.  Interpreter Needed?: No  Information entered by :: Remi Haggard LPN   Activities of Daily Living In your present state of health, do you have any difficulty performing the following activities: 12/06/2021 11/02/2021  Hearing? N N  Vision? N N  Difficulty concentrating or making decisions? N N  Walking or climbing stairs? Y Y  Dressing or bathing? N Y  Doing  errands, shopping? N Y  Quarry manager and eating ? N -  Using the Toilet? N -  In the past six months, have you accidently leaked urine? N -  Do you have problems with loss of bowel control? N -  Managing your Medications? N -  Managing your Finances? N -  Housekeeping or managing your Housekeeping? N -  Some recent data might be hidden    Patient Care Team: Loura Pardon, MD as PCP - General Lajean Manes, [facility] (Inactive) (Pharmacist)  Indicate any recent Medical Services you may have received from other than [facility] providers in the past year (date may be  approximate).     Assessment:   This is a routine wellness examination for [place].  Hearing/Vision screen Hearing Screening - Comments:: No trouble hearing Vision Screening - Comments:: Up to date vision  Dietary issues and exercise activities discussed: Current Exercise Habits: Home exercise routine, Type of exercise: stretching, Time (Minutes): 25, Frequency (Times/Week): 4, Weekly Exercise (Minutes/Week): 100, Intensity: Mild   Goals Addressed             This Visit's Progress    DIET - INCREASE WATER INTAKE         Depression Screen PHQ 2/9 Scores 12/06/2021 11/22/2021 09/03/2021 04/02/2021 12/25/2020 08/01/2020 09/21/2019  PHQ - 2 Score 0 0 0 0 0 0 0  PHQ- 9 Score 2 3 3  0 3 - -    Fall Risk Fall Risk  12/06/2021 11/28/2021 11/22/2021 05/03/2021 04/02/2021  Falls in the past year? 0 0 0 0 0  Number falls in past yr: 0 - 0 0 0  Injury with Fall? 0 - 0 0 0  Risk for fall due to : - - Impaired balance/gait No Fall Risks No Fall Risks  Follow up Falls evaluation completed;Falls prevention discussed - Falls evaluation completed Falls evaluation completed Falls evaluation completed    FALL RISK PREVENTION PERTAINING TO THE HOME:  Any stairs in or around the home? No  If so, are there any without handrails? No  Home free of loose throw rugs in walkways, pet beds, electrical cords, etc? Yes  Adequate lighting in your home to  reduce risk of falls? Yes   ASSISTIVE DEVICES UTILIZED TO PREVENT FALLS:  Life alert? No  Use of a cane, walker or w/c? No  Grab bars in the bathroom? Yes  Shower chair or bench in shower? Yes  Elevated toilet seat or a handicapped toilet? Yes   TIMED UP AND GO:  Was the test performed? No .    Cognitive Function:  Normal cognitive status assessed by direct observation by this Nurse Health Advisor. No abnormalities found.         Immunizations Immunization History  Administered Date(s) Administered   Fluad Quad(high Dose 65+) 09/03/2021   Influenza,inj,Quad PF,6+ Mos 09/16/2016, 07/16/2017, 07/22/2018, 08/18/2019, 08/01/2020   Moderna Sars-Covid-2 Vaccination 12/18/2019, 01/15/2020, 10/16/2020   Pneumococcal Conjugate-13 12/25/2020   Pneumococcal Polysaccharide-23 10/26/2002   Tdap 03/16/2013    TDAP status: Up to date  Flu Vaccine status: Up to date  Pneumococcal vaccine status: Up to date  Covid-19 vaccine status: Information provided on how to obtain vaccines.   Qualifies for Shingles Vaccine? Yes   Zostavax completed No   Shingrix Completed?: No.    Education has been provided regarding the importance of this vaccine. Patient has been advised to call insurance company to determine out of pocket expense if they have not yet received this vaccine. Advised may also receive vaccine at local pharmacy or Health Dept. Verbalized acceptance and understanding.  Screening Tests Health Maintenance  Topic Date Due   COVID-19 Vaccine (4 - Booster for Moderna series) 12/11/2020   Pneumonia Vaccine 56+ Years old (3) 12/25/2021   URINE MICROALBUMIN  12/25/2021   MAMMOGRAM  12/25/2021 (Originally 08/26/2005)   Zoster Vaccines- Shingrix (1 of 2) 03/05/2022 (Originally 08/26/2005)   COLONOSCOPY (Pts 45-51yrs Insurance coverage will need to be confirmed)  04/02/2022 (Originally 03/03/2021)   FOOT EXAM  12/25/2021   HEMOGLOBIN A1C  05/22/2022   OPHTHALMOLOGY EXAM  09/03/2022    TETANUS/TDAP  03/17/2023   INFLUENZA VACCINE  Completed  DEXA SCAN  Completed   Hepatitis C Screening  Completed   HPV VACCINES  Aged Out    Health Maintenance  Health Maintenance Due  Topic Date Due   COVID-19 Vaccine (4 - Booster for Moderna series) 12/11/2020   Pneumonia Vaccine 48+ Years old (3) 12/25/2021   URINE MICROALBUMIN  12/25/2021    Colorectal cancer screening: Type of screening: Colonoscopy. Completed 2012. Repeat every 10 years  Mammogram status: Completed  . Repeat every year  Bone Density status: Completed 22. Results reflect: Bone density results: NORMAL. Repeat every 0 years.  Lung Cancer Screening: (Low Dose CT Chest recommended if Age 67-80 years, 30 pack-year currently smoking OR have quit w/in 15years.) does not qualify. Lung Cancer Screening Referral:   Additional Screening:  Hepatitis C Screening: does not qualify; Completed 2018  Vision Screening: Recommended annual ophthalmology exams for early detection of glaucoma and other disorders of the eye. Is the patient up to date with their annual eye exam?  Yes  Who is the provider or what is the name of the office in which the patient attends annual eye exams? [facility] If pt is not established with a provider, would they like to be referred to a provider to establish care? No .   Dental Screening: Recommended annual dental exams for proper oral hygiene  Community Resource Referral / Chronic Care Management: CRR required this visit?  No   CCM required this visit?  No      Plan:     I have personally reviewed and noted the following in the patients chart:   Medical and social history Use of alcohol, tobacco or illicit drugs  Current medications and supplements including opioid prescriptions. Patient is not currently taking opioid prescriptions. Functional ability and status Nutritional status Physical activity Advanced directives List of other physicians Hospitalizations, surgeries, and ER  visits in previous 12 months Vitals Screenings to include cognitive, depression, and falls Referrals and appointments  In addition, I have reviewed and discussed with patient certain preventive protocols, quality metrics, and best practice recommendations. A written personalized care plan for preventive services as well as general preventive health recommendations were provided to patient.     Remi Haggard, LPN   [id number]   Nurse Notes: Patient has sent a mychart message about medication refills she will be out of Saturday  .

## 2021-12-06 NOTE — Telephone Encounter (Signed)
°  Chief Complaint: requesting assistance getting medications prescribed during hospital  Symptoms: bilateral leg swelling from feet to thighs to low abdomen. Wearing compression stockings. Shortness of breath with exertion. Tightness in legs.  Frequency: while hospitalized  Pertinent Negatives: Patient denies chest pain, difficulty breathing no fever, no pain in legs, no weeping fluid  Disposition: [] ED /[] Urgent Care (no appt availability in office) / [] Appointment(In office/virtual)/ []  [facility]/ [] Home Care/ [] Refused Recommended Disposition /[] [facility]/ [x]  Follow-up with PCP Additional Notes:  Recommended if swelling in legs worsens go to UC / ED.  Requesting assistance on getting medications sent to pharmacy, prescribed by Dr. Earleen Newport in hospital. Patient does not have a number to call Dr. Earleen Newport. Requesting a call back how she can get her medication filled. Eliquis , coreg, and diltiazem. Please advise . My Chart message sent to PCP.     Reason for Disposition  SEVERE leg swelling (e.g., swelling extends above knee, entire leg is swollen, weeping fluid)  Answer Assessment - Initial Assessment Questions 1. ONSET: "When did the swelling start?" (e.g., minutes, hours, days)     Has been since hospital D/C'd  2. LOCATION: "What part of the leg is swollen?"  "Are both legs swollen or just one leg?"     Feet to thighs and low abdomen  3. SEVERITY: "How bad is the swelling?" (e.g., localized; mild, moderate, severe)  - Localized - small area of swelling localized to one leg  - MILD pedal edema - swelling limited to foot and ankle, pitting edema < 1/4 inch (6 mm) deep, rest and elevation eliminate most or all swelling  - MODERATE edema - swelling of lower leg to knee, pitting edema > 1/4 inch (6 mm) deep, rest and elevation only partially reduce swelling  - SEVERE edema - swelling extends above knee, facial or hand swelling present      Swelling from feet to thighs  to lower abdomen 4. REDNESS: "Does the swelling look red or infected?"     No  5. PAIN: "Is the swelling painful to touch?" If Yes, ask: "How painful is it?"   (Scale 1-10; mild, moderate or severe)     Feels tight  6. FEVER: "Do you have a fever?" If Yes, ask: "What is it, how was it measured, and when did it start?"      na 7. CAUSE: "What do you think is causing the leg swelling?"     Not sure  8. MEDICAL HISTORY: "Do you have a history of heart failure, kidney disease, liver failure, or cancer?"     na 9. RECURRENT SYMPTOM: "Have you had leg swelling before?" If Yes, ask: "When was the last time?" "What happened that time?"     na 10. OTHER SYMPTOMS: "Do you have any other symptoms?" (e.g., chest pain, difficulty breathing)       No chest pain shortness of breath with exertion 11. PREGNANCY: "Is there any chance you are pregnant?" "When was your last menstrual period?"       na  Protocols used: Leg Swelling and Edema-A-AH

## 2021-12-06 NOTE — Patient Instructions (Signed)
Amanda [name] , Thank you for taking time to come for your Medicare Wellness Visit. I appreciate your ongoing commitment to your health goals. Please review the following plan we discussed and let me know if I can assist you in the future.   Screening recommendations/referrals: Colonoscopy: education provided Mammogram: up to date Bone Density: up to date Recommended yearly ophthalmology/optometry visit for glaucoma screening and checkup Recommended yearly dental visit for hygiene and checkup  Vaccinations: Influenza vaccine: up to date Pneumococcal vaccine: up to date Tdap vaccine: up to date Shingles vaccine: Education provided    Advanced directives: Education provided  Conditions/risks identified:   Next appointment: 01-03-2022 @ 3:20  [place]   Preventive Care 67 Years and Older, Female Preventive care refers to lifestyle choices and visits with your health care provider that can promote health and wellness. What does preventive care include? A yearly physical exam. This is also called an annual well check. Dental exams once or twice a year. Routine eye exams. Ask your health care provider how often you should have your eyes checked. Personal lifestyle choices, including: Daily care of your teeth and gums. Regular physical activity. Eating a healthy diet. Avoiding tobacco and drug use. Limiting alcohol use. Practicing safe sex. Taking low-dose aspirin every day. Taking vitamin and mineral supplements as recommended by your health care provider. What happens during an annual well check? The services and screenings done by your health care provider during your annual well check will depend on your age, overall health, lifestyle risk factors, and family history of disease. Counseling  Your health care provider may ask you questions about your: Alcohol use. Tobacco use. Drug use. Emotional well-being. Home and relationship well-being. Sexual activity. Eating habits. History of  falls. Memory and ability to understand (cognition). Work and work Statistician. Reproductive health. Screening  You may have the following tests or measurements: Height, weight, and BMI. Blood pressure. Lipid and cholesterol levels. These may be checked every 5 years, or more frequently if you are over 81 years old. Skin check. Lung cancer screening. You may have this screening every year starting at age 40 if you have a 30-pack-year history of smoking and currently smoke or have quit within the past 15 years. Fecal occult blood test (FOBT) of the stool. You may have this test every year starting at age 23. Flexible sigmoidoscopy or colonoscopy. You may have a sigmoidoscopy every 5 years or a colonoscopy every 10 years starting at age 6. Hepatitis C blood test. Hepatitis B blood test. Sexually transmitted disease (STD) testing. Diabetes screening. This is done by checking your blood sugar (glucose) after you have not eaten for a while (fasting). You may have this done every 1-3 years. Bone density scan. This is done to screen for osteoporosis. You may have this done starting at age 45. Mammogram. This may be done every 1-2 years. Talk to your health care provider about how often you should have regular mammograms. Talk with your health care provider about your test results, treatment options, and if necessary, the need for more tests. Vaccines  Your health care provider may recommend certain vaccines, such as: Influenza vaccine. This is recommended every year. Tetanus, diphtheria, and acellular pertussis (Tdap, Td) vaccine. You may need a Td booster every 10 years. Zoster vaccine. You may need this after age 20. Pneumococcal 13-valent conjugate (PCV13) vaccine. One dose is recommended after age 44. Pneumococcal polysaccharide (PPSV23) vaccine. One dose is recommended after age 51. Talk to your health care provider  about which screenings and vaccines you need and how often you need  them. This information is not intended to replace advice given to you by your health care provider. Make sure you discuss any questions you have with your health care provider. Document Released: 11/03/2015 Document Revised: 06/26/2016 Document Reviewed: 08/08/2015 Elsevier Interactive Patient Education  2017 [place] Prevention in the Home Falls can cause injuries. They can happen to people of all ages. There are many things you can do to make your home safe and to help prevent falls. What can I do on the outside of my home? Regularly fix the edges of walkways and driveways and fix any cracks. Remove anything that might make you trip as you walk through a door, such as a raised step or threshold. Trim any bushes or trees on the path to your home. Use bright outdoor lighting. Clear any walking paths of anything that might make someone trip, such as rocks or tools. Regularly check to see if handrails are loose or broken. Make sure that both sides of any steps have handrails. Any raised decks and porches should have guardrails on the edges. Have any leaves, snow, or ice cleared regularly. Use sand or salt on walking paths during winter. Clean up any spills in your garage right away. This includes oil or grease spills. What can I do in the bathroom? Use night lights. Install grab bars by the toilet and in the tub and shower. Do not use towel bars as grab bars. Use non-skid mats or decals in the tub or shower. If you need to sit down in the shower, use a plastic, non-slip stool. Keep the floor dry. Clean up any water that spills on the floor as soon as it happens. Remove soap buildup in the tub or shower regularly. Attach bath mats securely with double-sided non-slip rug tape. Do not have throw rugs and other things on the floor that can make you trip. What can I do in the bedroom? Use night lights. Make sure that you have a light by your bed that is easy to reach. Do not use  any sheets or blankets that are too big for your bed. They should not hang down onto the floor. Have a firm chair that has side arms. You can use this for support while you get dressed. Do not have throw rugs and other things on the floor that can make you trip. What can I do in the kitchen? Clean up any spills right away. Avoid walking on wet floors. Keep items that you use a lot in easy-to-reach places. If you need to reach something above you, use a strong step stool that has a grab bar. Keep electrical cords out of the way. Do not use floor polish or wax that makes floors slippery. If you must use wax, use non-skid floor wax. Do not have throw rugs and other things on the floor that can make you trip. What can I do with my stairs? Do not leave any items on the stairs. Make sure that there are handrails on both sides of the stairs and use them. Fix handrails that are broken or loose. Make sure that handrails are as long as the stairways. Check any carpeting to make sure that it is firmly attached to the stairs. Fix any carpet that is loose or worn. Avoid having throw rugs at the top or bottom of the stairs. If you do have throw rugs, attach them to the floor  with carpet tape. Make sure that you have a light switch at the top of the stairs and the bottom of the stairs. If you do not have them, ask someone to add them for you. What else can I do to help prevent falls? Wear shoes that: Do not have high heels. Have rubber bottoms. Are comfortable and fit you well. Are closed at the toe. Do not wear sandals. If you use a stepladder: Make sure that it is fully opened. Do not climb a closed stepladder. Make sure that both sides of the stepladder are locked into place. Ask someone to hold it for you, if possible. Clearly mark and make sure that you can see: Any grab bars or handrails. First and last steps. Where the edge of each step is. Use tools that help you move around (mobility aids)  if they are needed. These include: Canes. Walkers. Scooters. Crutches. Turn on the lights when you go into a dark area. Replace any light bulbs as soon as they burn out. Set up your furniture so you have a clear path. Avoid moving your furniture around. If any of your floors are uneven, fix them. If there are any pets around you, be aware of where they are. Review your medicines with your doctor. Some medicines can make you feel dizzy. This can increase your chance of falling. Ask your doctor what other things that you can do to help prevent falls. This information is not intended to replace advice given to you by your health care provider. Make sure you discuss any questions you have with your health care provider. Document Released: 08/03/2009 Document Revised: 03/14/2016 Document Reviewed: 11/11/2014 Elsevier Interactive Patient Education  2017 [employer].

## 2021-12-07 DIAGNOSIS — D631 Anemia in chronic kidney disease: Secondary | ICD-10-CM | POA: Diagnosis not present

## 2021-12-07 DIAGNOSIS — I248 Other forms of acute ischemic heart disease: Secondary | ICD-10-CM | POA: Diagnosis not present

## 2021-12-07 DIAGNOSIS — Z4803 Encounter for change or removal of drains: Secondary | ICD-10-CM | POA: Diagnosis not present

## 2021-12-07 DIAGNOSIS — E1122 Type 2 diabetes mellitus with diabetic chronic kidney disease: Secondary | ICD-10-CM | POA: Diagnosis not present

## 2021-12-07 DIAGNOSIS — K8000 Calculus of gallbladder with acute cholecystitis without obstruction: Secondary | ICD-10-CM | POA: Diagnosis not present

## 2021-12-07 DIAGNOSIS — N390 Urinary tract infection, site not specified: Secondary | ICD-10-CM | POA: Diagnosis not present

## 2021-12-07 DIAGNOSIS — A4151 Sepsis due to Escherichia coli [E. coli]: Secondary | ICD-10-CM | POA: Diagnosis not present

## 2021-12-07 DIAGNOSIS — E1159 Type 2 diabetes mellitus with other circulatory complications: Secondary | ICD-10-CM | POA: Diagnosis not present

## 2021-12-07 DIAGNOSIS — A4159 Other Gram-negative sepsis: Secondary | ICD-10-CM | POA: Diagnosis not present

## 2021-12-07 DIAGNOSIS — N1831 Chronic kidney disease, stage 3a: Secondary | ICD-10-CM | POA: Diagnosis not present

## 2021-12-07 NOTE — Telephone Encounter (Signed)
Medications were sent in yesterday by Dr. Laural Benes.

## 2021-12-10 ENCOUNTER — Other Ambulatory Visit: Payer: Self-pay | Admitting: Radiology

## 2021-12-11 ENCOUNTER — Other Ambulatory Visit: Payer: Self-pay

## 2021-12-11 ENCOUNTER — Telehealth: Payer: Self-pay

## 2021-12-11 ENCOUNTER — Ambulatory Visit
Admission: RE | Admit: 2021-12-11 | Discharge: 2021-12-11 | Disposition: A | Discharge status: Home / Self Care | Payer: Medicare HMO | Source: Ambulatory Visit | Attending: Surgery | Admitting: Surgery

## 2021-12-11 DIAGNOSIS — K8000 Calculus of gallbladder with acute cholecystitis without obstruction: Secondary | ICD-10-CM | POA: Insufficient documentation

## 2021-12-11 DIAGNOSIS — Z4589 Encounter for adjustment and management of other implanted devices: Secondary | ICD-10-CM | POA: Diagnosis not present

## 2021-12-11 DIAGNOSIS — Z9689 Presence of other specified functional implants: Secondary | ICD-10-CM | POA: Insufficient documentation

## 2021-12-11 DIAGNOSIS — K819 Cholecystitis, unspecified: Secondary | ICD-10-CM

## 2021-12-11 DIAGNOSIS — K8020 Calculus of gallbladder without cholecystitis without obstruction: Secondary | ICD-10-CM | POA: Diagnosis not present

## 2021-12-11 HISTORY — PX: IR CHOLANGIOGRAM EXISTING TUBE: IMG6040

## 2021-12-11 MED ORDER — IOHEXOL 350 MG/ML SOLN
6.0000 mL | Freq: Once | INTRAVENOUS | Status: AC | PRN
  Administered 2021-12-11: 6 mL
  Filled 2021-12-11: qty 10

## 2021-12-11 NOTE — Chronic Care Management (AMB) (Signed)
° ° °  Chronic Care Management Pharmacy Assistant   Name: Amanda [name]  MRN: [id number] DOB: 29-Jul-1956   Reason for Encounter: Disease State Diabetes Mellitus   Recent office visits:  11/22/21-[name] Charlotta Newton, MD (PCP) Seen for hospital follow up visit. Labs ordered.  09/03/21-[name] Vigg, MD (PCP) Seen for hypertension and diabetes. Flu vaccine given.   Recent consult visits:  11/28/21-Jose Piscoya, MD (General surgery) Seen for cholecystitis. Follow up in 3 weeks.  11/19/21-Brice Maureen Ralphs, MD (Cardiology) Establishing patient visit. EKG ordered. Follow up in 4 weeks.   Hospital visits:  Medication Reconciliation was completed by comparing discharge summary, patients EMR and Pharmacy list, and upon discussion with patient.  Admitted to the hospital on 11/02/21 due to Emesis. Discharge date was 11/08/21 . Discharged from [facility].    New?Medications Started at [facility] Discharge:?? -started None noted  Medication Changes at Hospital Discharge: -Changed None noted  Medications Discontinued at Hospital Discharge: -Stopped None noted  Medications that remain the same after Hospital Discharge:??  -All other medications will remain the same.    Medications: Outpatient Encounter Medications as of 12/11/2021  Medication Sig   apixaban (ELIQUIS) 5 MG TABS tablet Take 1 tablet (5 mg total) by mouth 2 (two) times daily.   Calcium Carb-Cholecalciferol (CALCIUM 600+D3) 600-200 MG-UNIT TABS Take 1 tablet by mouth daily.   carvedilol (COREG) 12.5 MG tablet Take 1 tablet (12.5 mg total) by mouth 2 (two) times daily with a meal.   diltiazem (CARDIZEM CD) 180 MG 24 hr capsule Take 1 capsule (180 mg total) by mouth daily.   Dulaglutide (TRULICITY) 3 MG/0.5ML SOPN INJECT 1 SYRINGE SUBCUTANEOUSLY ONCE A WEEK Strength: 3 MG/0.5ML   Multiple Vitamin (MULTIVITAMIN) tablet Take 1 tablet by mouth daily.   omeprazole (PRILOSEC) 20 MG capsule Take 20 mg by mouth daily.   sodium  chloride flush (NS) 0.9 % SOLN 5 mLs by Intracatheter route every 8 (eight) hours.   traZODone (DESYREL) 50 MG tablet Take 1 tablet (50 mg total) by mouth at bedtime.   vitamin B-12 (CYANOCOBALAMIN) 500 MCG tablet Take by mouth.   No facility-administered encounter medications on file as of 12/11/2021.   Current antihyperglycemic regimen:  Trulicity 3 mg inject once a week  What recent interventions/DTPs have been made to improve glycemic control:  None noted  Have there been any recent hospitalizations or ED visits since last visit with CPP? No  Patient denies hypoglycemic symptoms, including Pale, Sweaty, Shaky, Hungry, Nervous/irritable, and Vision changes  Patient denies hyperglycemic symptoms, including blurry vision, excessive thirst, fatigue, polyuria, and weakness  How often are you checking your blood sugar? once daily  What are your blood sugars ranging?  Fasting: 150-180 Before meals:  After meals:  Bedtime:   During the week, how often does your blood glucose drop below 70? Never  Are you checking your feet daily/regularly?  Patient states she does check her feet daily.  Adherence Review: Is the patient currently on a STATIN medication? No Is the patient currently on ACE/ARB medication? No Does the patient have >5 day gap between last estimated fill dates? No   Care Gaps: COVID-19 Vaccine:Overdue since 12/11/2020  Star Rating Drugs: Trulicity 3 mg Last filled:  Rance Muir, RMA Health Concierge

## 2021-12-13 DIAGNOSIS — D61818 Other pancytopenia: Secondary | ICD-10-CM | POA: Diagnosis not present

## 2021-12-13 DIAGNOSIS — N390 Urinary tract infection, site not specified: Secondary | ICD-10-CM | POA: Diagnosis not present

## 2021-12-13 DIAGNOSIS — E669 Obesity, unspecified: Secondary | ICD-10-CM | POA: Diagnosis not present

## 2021-12-13 DIAGNOSIS — A4151 Sepsis due to Escherichia coli [E. coli]: Secondary | ICD-10-CM | POA: Diagnosis not present

## 2021-12-13 DIAGNOSIS — N1831 Chronic kidney disease, stage 3a: Secondary | ICD-10-CM | POA: Diagnosis not present

## 2021-12-13 DIAGNOSIS — K8000 Calculus of gallbladder with acute cholecystitis without obstruction: Secondary | ICD-10-CM | POA: Diagnosis not present

## 2021-12-13 DIAGNOSIS — Z6831 Body mass index (BMI) 31.0-31.9, adult: Secondary | ICD-10-CM | POA: Diagnosis not present

## 2021-12-13 DIAGNOSIS — E1169 Type 2 diabetes mellitus with other specified complication: Secondary | ICD-10-CM | POA: Diagnosis not present

## 2021-12-13 DIAGNOSIS — E871 Hypo-osmolality and hyponatremia: Secondary | ICD-10-CM | POA: Diagnosis not present

## 2021-12-13 DIAGNOSIS — G4700 Insomnia, unspecified: Secondary | ICD-10-CM | POA: Diagnosis not present

## 2021-12-13 DIAGNOSIS — I248 Other forms of acute ischemic heart disease: Secondary | ICD-10-CM | POA: Diagnosis not present

## 2021-12-13 DIAGNOSIS — E872 Acidosis, unspecified: Secondary | ICD-10-CM | POA: Diagnosis not present

## 2021-12-13 DIAGNOSIS — Z7901 Long term (current) use of anticoagulants: Secondary | ICD-10-CM | POA: Diagnosis not present

## 2021-12-13 DIAGNOSIS — I152 Hypertension secondary to endocrine disorders: Secondary | ICD-10-CM | POA: Diagnosis not present

## 2021-12-13 DIAGNOSIS — E1159 Type 2 diabetes mellitus with other circulatory complications: Secondary | ICD-10-CM | POA: Diagnosis not present

## 2021-12-13 DIAGNOSIS — E1122 Type 2 diabetes mellitus with diabetic chronic kidney disease: Secondary | ICD-10-CM | POA: Diagnosis not present

## 2021-12-13 DIAGNOSIS — N179 Acute kidney failure, unspecified: Secondary | ICD-10-CM | POA: Diagnosis not present

## 2021-12-13 DIAGNOSIS — Z792 Long term (current) use of antibiotics: Secondary | ICD-10-CM | POA: Diagnosis not present

## 2021-12-13 DIAGNOSIS — A4159 Other Gram-negative sepsis: Secondary | ICD-10-CM | POA: Diagnosis not present

## 2021-12-13 DIAGNOSIS — K219 Gastro-esophageal reflux disease without esophagitis: Secondary | ICD-10-CM | POA: Diagnosis not present

## 2021-12-13 DIAGNOSIS — Z4803 Encounter for change or removal of drains: Secondary | ICD-10-CM | POA: Diagnosis not present

## 2021-12-13 DIAGNOSIS — E785 Hyperlipidemia, unspecified: Secondary | ICD-10-CM | POA: Diagnosis not present

## 2021-12-13 DIAGNOSIS — E538 Deficiency of other specified B group vitamins: Secondary | ICD-10-CM | POA: Diagnosis not present

## 2021-12-13 DIAGNOSIS — D631 Anemia in chronic kidney disease: Secondary | ICD-10-CM | POA: Diagnosis not present

## 2021-12-15 DIAGNOSIS — I152 Hypertension secondary to endocrine disorders: Secondary | ICD-10-CM | POA: Diagnosis not present

## 2021-12-15 DIAGNOSIS — E669 Obesity, unspecified: Secondary | ICD-10-CM | POA: Diagnosis not present

## 2021-12-15 DIAGNOSIS — D61818 Other pancytopenia: Secondary | ICD-10-CM | POA: Diagnosis not present

## 2021-12-15 DIAGNOSIS — D631 Anemia in chronic kidney disease: Secondary | ICD-10-CM | POA: Diagnosis not present

## 2021-12-15 DIAGNOSIS — E872 Acidosis, unspecified: Secondary | ICD-10-CM | POA: Diagnosis not present

## 2021-12-15 DIAGNOSIS — I248 Other forms of acute ischemic heart disease: Secondary | ICD-10-CM | POA: Diagnosis not present

## 2021-12-15 DIAGNOSIS — K8000 Calculus of gallbladder with acute cholecystitis without obstruction: Secondary | ICD-10-CM | POA: Diagnosis not present

## 2021-12-15 DIAGNOSIS — E871 Hypo-osmolality and hyponatremia: Secondary | ICD-10-CM | POA: Diagnosis not present

## 2021-12-15 DIAGNOSIS — E538 Deficiency of other specified B group vitamins: Secondary | ICD-10-CM | POA: Diagnosis not present

## 2021-12-15 DIAGNOSIS — E1159 Type 2 diabetes mellitus with other circulatory complications: Secondary | ICD-10-CM | POA: Diagnosis not present

## 2021-12-15 DIAGNOSIS — N1831 Chronic kidney disease, stage 3a: Secondary | ICD-10-CM | POA: Diagnosis not present

## 2021-12-15 DIAGNOSIS — Z4803 Encounter for change or removal of drains: Secondary | ICD-10-CM | POA: Diagnosis not present

## 2021-12-15 DIAGNOSIS — G4700 Insomnia, unspecified: Secondary | ICD-10-CM | POA: Diagnosis not present

## 2021-12-15 DIAGNOSIS — A4159 Other Gram-negative sepsis: Secondary | ICD-10-CM | POA: Diagnosis not present

## 2021-12-15 DIAGNOSIS — Z6831 Body mass index (BMI) 31.0-31.9, adult: Secondary | ICD-10-CM | POA: Diagnosis not present

## 2021-12-15 DIAGNOSIS — E1169 Type 2 diabetes mellitus with other specified complication: Secondary | ICD-10-CM | POA: Diagnosis not present

## 2021-12-15 DIAGNOSIS — Z7901 Long term (current) use of anticoagulants: Secondary | ICD-10-CM | POA: Diagnosis not present

## 2021-12-15 DIAGNOSIS — N179 Acute kidney failure, unspecified: Secondary | ICD-10-CM | POA: Diagnosis not present

## 2021-12-15 DIAGNOSIS — A4151 Sepsis due to Escherichia coli [E. coli]: Secondary | ICD-10-CM | POA: Diagnosis not present

## 2021-12-15 DIAGNOSIS — E785 Hyperlipidemia, unspecified: Secondary | ICD-10-CM | POA: Diagnosis not present

## 2021-12-15 DIAGNOSIS — Z792 Long term (current) use of antibiotics: Secondary | ICD-10-CM | POA: Diagnosis not present

## 2021-12-15 DIAGNOSIS — E1122 Type 2 diabetes mellitus with diabetic chronic kidney disease: Secondary | ICD-10-CM | POA: Diagnosis not present

## 2021-12-15 DIAGNOSIS — N390 Urinary tract infection, site not specified: Secondary | ICD-10-CM | POA: Diagnosis not present

## 2021-12-15 DIAGNOSIS — K219 Gastro-esophageal reflux disease without esophagitis: Secondary | ICD-10-CM | POA: Diagnosis not present

## 2021-12-20 DIAGNOSIS — N1831 Chronic kidney disease, stage 3a: Secondary | ICD-10-CM | POA: Diagnosis not present

## 2021-12-20 DIAGNOSIS — E871 Hypo-osmolality and hyponatremia: Secondary | ICD-10-CM | POA: Diagnosis not present

## 2021-12-20 DIAGNOSIS — A4151 Sepsis due to Escherichia coli [E. coli]: Secondary | ICD-10-CM | POA: Diagnosis not present

## 2021-12-20 DIAGNOSIS — A4159 Other Gram-negative sepsis: Secondary | ICD-10-CM | POA: Diagnosis not present

## 2021-12-20 DIAGNOSIS — N390 Urinary tract infection, site not specified: Secondary | ICD-10-CM | POA: Diagnosis not present

## 2021-12-20 DIAGNOSIS — G4700 Insomnia, unspecified: Secondary | ICD-10-CM | POA: Diagnosis not present

## 2021-12-20 DIAGNOSIS — D631 Anemia in chronic kidney disease: Secondary | ICD-10-CM | POA: Diagnosis not present

## 2021-12-20 DIAGNOSIS — Z6831 Body mass index (BMI) 31.0-31.9, adult: Secondary | ICD-10-CM | POA: Diagnosis not present

## 2021-12-20 DIAGNOSIS — E1169 Type 2 diabetes mellitus with other specified complication: Secondary | ICD-10-CM | POA: Diagnosis not present

## 2021-12-20 DIAGNOSIS — N179 Acute kidney failure, unspecified: Secondary | ICD-10-CM | POA: Diagnosis not present

## 2021-12-20 DIAGNOSIS — D61818 Other pancytopenia: Secondary | ICD-10-CM | POA: Diagnosis not present

## 2021-12-20 DIAGNOSIS — K219 Gastro-esophageal reflux disease without esophagitis: Secondary | ICD-10-CM | POA: Diagnosis not present

## 2021-12-20 DIAGNOSIS — E1159 Type 2 diabetes mellitus with other circulatory complications: Secondary | ICD-10-CM | POA: Diagnosis not present

## 2021-12-20 DIAGNOSIS — I152 Hypertension secondary to endocrine disorders: Secondary | ICD-10-CM | POA: Diagnosis not present

## 2021-12-20 DIAGNOSIS — K8000 Calculus of gallbladder with acute cholecystitis without obstruction: Secondary | ICD-10-CM | POA: Diagnosis not present

## 2021-12-20 DIAGNOSIS — E538 Deficiency of other specified B group vitamins: Secondary | ICD-10-CM | POA: Diagnosis not present

## 2021-12-20 DIAGNOSIS — E785 Hyperlipidemia, unspecified: Secondary | ICD-10-CM | POA: Diagnosis not present

## 2021-12-20 DIAGNOSIS — Z7901 Long term (current) use of anticoagulants: Secondary | ICD-10-CM | POA: Diagnosis not present

## 2021-12-20 DIAGNOSIS — E872 Acidosis, unspecified: Secondary | ICD-10-CM | POA: Diagnosis not present

## 2021-12-20 DIAGNOSIS — E1122 Type 2 diabetes mellitus with diabetic chronic kidney disease: Secondary | ICD-10-CM | POA: Diagnosis not present

## 2021-12-20 DIAGNOSIS — Z4803 Encounter for change or removal of drains: Secondary | ICD-10-CM | POA: Diagnosis not present

## 2021-12-20 DIAGNOSIS — Z792 Long term (current) use of antibiotics: Secondary | ICD-10-CM | POA: Diagnosis not present

## 2021-12-20 DIAGNOSIS — I248 Other forms of acute ischemic heart disease: Secondary | ICD-10-CM | POA: Diagnosis not present

## 2021-12-20 DIAGNOSIS — E669 Obesity, unspecified: Secondary | ICD-10-CM | POA: Diagnosis not present

## 2021-12-24 ENCOUNTER — Other Ambulatory Visit: Payer: Self-pay

## 2021-12-24 DIAGNOSIS — I480 Paroxysmal atrial fibrillation: Secondary | ICD-10-CM | POA: Diagnosis not present

## 2021-12-24 DIAGNOSIS — N182 Chronic kidney disease, stage 2 (mild): Secondary | ICD-10-CM

## 2021-12-24 DIAGNOSIS — I10 Essential (primary) hypertension: Secondary | ICD-10-CM | POA: Diagnosis not present

## 2021-12-24 DIAGNOSIS — E1122 Type 2 diabetes mellitus with diabetic chronic kidney disease: Secondary | ICD-10-CM

## 2021-12-24 MED ORDER — TRULICITY 3 MG/0.5ML ~~LOC~~ SOAJ: 3.0000 mg | SUBCUTANEOUS | 3 refills | Status: AC

## 2021-12-26 ENCOUNTER — Encounter: Payer: Self-pay | Admitting: Surgery

## 2021-12-26 ENCOUNTER — Telehealth: Payer: Self-pay

## 2021-12-26 ENCOUNTER — Telehealth: Payer: Self-pay | Admitting: Surgery

## 2021-12-26 ENCOUNTER — Other Ambulatory Visit: Payer: Self-pay

## 2021-12-26 ENCOUNTER — Ambulatory Visit: Payer: Medicare HMO | Admitting: Surgery

## 2021-12-26 ENCOUNTER — Ambulatory Visit: Payer: Self-pay

## 2021-12-26 VITALS — BP 148/91 | HR 108 | Temp 97.8°F | Ht 61.0 in | Wt 192.0 lb

## 2021-12-26 DIAGNOSIS — I152 Hypertension secondary to endocrine disorders: Secondary | ICD-10-CM | POA: Diagnosis not present

## 2021-12-26 DIAGNOSIS — K819 Cholecystitis, unspecified: Secondary | ICD-10-CM

## 2021-12-26 DIAGNOSIS — E1159 Type 2 diabetes mellitus with other circulatory complications: Secondary | ICD-10-CM | POA: Diagnosis not present

## 2021-12-26 DIAGNOSIS — K8000 Calculus of gallbladder with acute cholecystitis without obstruction: Secondary | ICD-10-CM | POA: Diagnosis not present

## 2021-12-26 DIAGNOSIS — E669 Obesity, unspecified: Secondary | ICD-10-CM | POA: Diagnosis not present

## 2021-12-26 DIAGNOSIS — N179 Acute kidney failure, unspecified: Secondary | ICD-10-CM | POA: Diagnosis not present

## 2021-12-26 DIAGNOSIS — D631 Anemia in chronic kidney disease: Secondary | ICD-10-CM | POA: Diagnosis not present

## 2021-12-26 DIAGNOSIS — E871 Hypo-osmolality and hyponatremia: Secondary | ICD-10-CM | POA: Diagnosis not present

## 2021-12-26 DIAGNOSIS — E1122 Type 2 diabetes mellitus with diabetic chronic kidney disease: Secondary | ICD-10-CM | POA: Diagnosis not present

## 2021-12-26 DIAGNOSIS — E785 Hyperlipidemia, unspecified: Secondary | ICD-10-CM | POA: Diagnosis not present

## 2021-12-26 DIAGNOSIS — K8010 Calculus of gallbladder with chronic cholecystitis without obstruction: Secondary | ICD-10-CM

## 2021-12-26 DIAGNOSIS — Z4803 Encounter for change or removal of drains: Secondary | ICD-10-CM | POA: Diagnosis not present

## 2021-12-26 DIAGNOSIS — N390 Urinary tract infection, site not specified: Secondary | ICD-10-CM | POA: Diagnosis not present

## 2021-12-26 DIAGNOSIS — Z6831 Body mass index (BMI) 31.0-31.9, adult: Secondary | ICD-10-CM | POA: Diagnosis not present

## 2021-12-26 DIAGNOSIS — Z7901 Long term (current) use of anticoagulants: Secondary | ICD-10-CM | POA: Diagnosis not present

## 2021-12-26 DIAGNOSIS — E538 Deficiency of other specified B group vitamins: Secondary | ICD-10-CM | POA: Diagnosis not present

## 2021-12-26 DIAGNOSIS — A4159 Other Gram-negative sepsis: Secondary | ICD-10-CM | POA: Diagnosis not present

## 2021-12-26 DIAGNOSIS — Z792 Long term (current) use of antibiotics: Secondary | ICD-10-CM | POA: Diagnosis not present

## 2021-12-26 DIAGNOSIS — A4151 Sepsis due to Escherichia coli [E. coli]: Secondary | ICD-10-CM | POA: Diagnosis not present

## 2021-12-26 DIAGNOSIS — E872 Acidosis, unspecified: Secondary | ICD-10-CM | POA: Diagnosis not present

## 2021-12-26 DIAGNOSIS — G4700 Insomnia, unspecified: Secondary | ICD-10-CM | POA: Diagnosis not present

## 2021-12-26 DIAGNOSIS — D61818 Other pancytopenia: Secondary | ICD-10-CM | POA: Diagnosis not present

## 2021-12-26 DIAGNOSIS — K219 Gastro-esophageal reflux disease without esophagitis: Secondary | ICD-10-CM | POA: Diagnosis not present

## 2021-12-26 DIAGNOSIS — N1831 Chronic kidney disease, stage 3a: Secondary | ICD-10-CM | POA: Diagnosis not present

## 2021-12-26 DIAGNOSIS — E1169 Type 2 diabetes mellitus with other specified complication: Secondary | ICD-10-CM | POA: Diagnosis not present

## 2021-12-26 DIAGNOSIS — I248 Other forms of acute ischemic heart disease: Secondary | ICD-10-CM | POA: Diagnosis not present

## 2021-12-26 NOTE — H&P (View-Only) (Signed)
?12/26/2021 ? ?History of Present Illness: ?Amanda [name] is a 66 y.o. female with a history of acute cholecystitis presenting with sepsis atrial fibrillation hypertension on November 02, 2021.  She had a percutaneous cholecystostomy drain placement on the same day and was discharged from the hospital on 1/19 on Eliquis, Coreg, and diltiazem.  She has seen Dr. Nehemiah Massed and he has cleared the patient for surgery from a cardiac standpoint.  The patient had a cholangiogram on 12/11/2021 through the existing drain which showed a patent cystic duct and had a drain exchange at that time. ? ?Today she presents for follow-up and for scheduling for surgery.  She reports that she is feeling little more tired reports that she still continues having swelling in her lower extremities.  She reports that Dr. Nehemiah Massed wanted to wait until after surgery to make some adjustments to her medications that can help with the swelling and currently she still wearing her compression stockings.  Denies any abdominal pain, nausea, vomiting.  The drain has been working well and has had low output.  They contribute flushing daily. ? ?Past Medical History: ?Past Medical History:  ?Diagnosis Date  ? Anxiety   ? Atrial fibrillation ([place])   ? Cholecystitis   ? Diabetes mellitus without complication ([place])   ? Hyperlipidemia   ? Hypertension   ?  ? ?Past Surgical History: ?Past Surgical History:  ?Procedure Laterality Date  ? COLONOSCOPY  2012  ? IR CHOLANGIOGRAM EXISTING TUBE  12/11/2021  ? IR PERC CHOLECYSTOSTOMY  11/02/2021  ? ? ?Home Medications: ?Prior to Admission medications   ?Medication Sig Start Date End Date Taking? Authorizing Provider  ?apixaban (ELIQUIS) 5 MG TABS tablet Take 1 tablet (5 mg total) by mouth 2 (two) times daily. 12/06/21  Yes Johnson, Megan P, DO  ?Calcium Carb-Cholecalciferol (CALCIUM 600+D3) 600-200 MG-UNIT TABS Take 1 tablet by mouth daily.   Yes [provider]  ?carvedilol (COREG) 12.5 MG tablet Take 1 tablet (12.5  mg total) by mouth 2 (two) times daily with a meal. 12/06/21  Yes Johnson, Megan P, DO  ?diltiazem (CARDIZEM CD) 180 MG 24 hr capsule Take 1 capsule (180 mg total) by mouth daily. 12/06/21  Yes Johnson, Megan P, DO  ?Dulaglutide (TRULICITY) 3 0000000 SOPN INJECT 1 SYRINGE SUBCUTANEOUSLY ONCE A WEEK ?Strength: 3 MG/0.5ML 11/23/21  Yes Vigg, [name], MD  ?Dulaglutide (TRULICITY) 3 0000000 SOPN Inject 3 mg as directed once a week. 12/24/21 03/24/22 Yes Vigg, [name], MD  ?Multiple Vitamin (MULTIVITAMIN) tablet Take 1 tablet by mouth daily.   Yes [provider]  ?omeprazole (PRILOSEC) 20 MG capsule Take 20 mg by mouth daily.   Yes [provider]  ?sodium chloride flush (NS) 0.9 % SOLN 5 mLs by Intracatheter route every 8 (eight) hours. 11/06/21 01/05/22 Yes Tylene Fantasia, PA-C  ?traZODone (DESYREL) 50 MG tablet Take 1 tablet (50 mg total) by mouth at bedtime. 11/08/21  Yes Wieting, Richard, MD  ?vitamin B-12 (CYANOCOBALAMIN) 500 MCG tablet Take by mouth.   Yes [provider]  ? ? ?Allergies: ?Allergies  ?Allergen Reactions  ? Tramadol Other (See Comments)  ?  "woozy"  ? Penicillins Other (See Comments)  ?  Dizziness ?Tolerated augmentin but does not like taste  ? ? ?Review of Systems: ?Review of Systems  ?Constitutional:  Negative for chills and fever.  ?HENT:  Negative for hearing loss.   ?Respiratory:  Negative for shortness of breath.   ?Cardiovascular:  Negative for chest pain.  ?Gastrointestinal:  Negative for abdominal pain, nausea and vomiting.  ?Genitourinary:  Negative for dysuria.  ?Musculoskeletal:  Negative for myalgias.  ?Skin:  Negative for rash.  ?Neurological:  Negative for dizziness.  ?Psychiatric/Behavioral:  Negative for depression.   ? ?Physical Exam ?BP (!) 148/91   Pulse (!) 108   Temp 97.8 ?F (36.6 ?C) (Oral)   Ht 5\' 1"  (1.549 m)   Wt 192 lb (87.1 kg)   SpO2 97%   BMI 36.28 kg/m?  ?CONSTITUTIONAL: No acute distress, well nourished. ?HEENT:  Normocephalic, atraumatic,  extraocular motion intact. ?NECK: Trachea is midline, no jugular venous distention ?RESPIRATORY:  Very mild wheezing, but otherwise lungs are clear.  Normal respiratory effort without pathologic use of accessory muscles. ?CARDIOVASCULAR: Irregular rhythm, borderline rate.  No murmurs. ?GI: The abdomen is soft, non-distended, non-tender.  Patient has a percutaneous cholecystostomy drain in place without any evidence of infection.  Drain has low volume bilious fluid.  ?MUSCULOSKELETAL: Has slow gait and requires a wheelchair inside her office.  There is peripheral edema bilateral lower extremities. ?NEUROLOGIC:  Motor and sensation is grossly normal.  Cranial nerves are grossly intact. ?PSYCH:  Alert and oriented to person, place and time. Affect is normal. ? ?Labs/Imaging: ?Cholangiogram on 12/11/21: ?IMPRESSION: ?1. Cholangiogram demonstrates appropriate decompression of a stone ?filled gallbladder. The cystic duct is demonstrated to be patent, ?with brisk passage of contrast material through the ampulla into the ?small bowel. No obvious filling defect identified within the common ?bile duct. ?2. Successful exchange of the existing cholecystostomy tube for a ?new similar 12 [place] cholecystostomy tube positioned within the ?gallbladder. Cholecystostomy tube attached to bag drainage. ?3. Further management per surgery. ? ? ?Assessment and Plan: ?This is a 66 y.o. female with history of cholecystitis status post percutaneous cholecystostomy drain placement. ? ?- Discussed with the patient that indeed diltiazem can potentially cause edema as a side effect and if Dr. Nehemiah Massed would like to wait until after surgery so there is less stress to her heart while making changes to the diltiazem, then we should go ahead and proceed to schedule surgery.  At this point, has been enough time to allow for the right upper quadrant area to heal and the inflammation to subside after this episode of cholecystitis.  The patient has not  had any issues with the drain recently and her last cholangiogram showed a patent cystic duct. ?- Discussed with the patient and then the plan for a robotic assisted cholecystectomy with ICG cholangiogram and reviewed with her the surgery at length including the risks of bleeding, infection, injury to surrounding structures, that this would be an outpatient procedure, postoperative activity restrictions, and she is willing to proceed.  She understands that we will have to stop her Eliquis 3 days prior to surgery and this has been approved by Dr. Nehemiah Massed as well. ?- We will schedule patient for 01/09/2022.  Her last dose of Eliquis would be on 01/06/2022.  We will also send for medical clearance to Dr. Neomia Dear. ? ?I spent 40 minutes dedicated to the care of this patient on the date of this encounter to include pre-visit review of records, face-to-face time with the patient discussing diagnosis and management, and any post-visit coordination of care. ? ? ?Melvyn Neth, MD ?[facility] ? ? ?  ?

## 2021-12-26 NOTE — Telephone Encounter (Signed)
Pt has a cough and asked if there was something she can take for it / please advise  ? ? ?Chief Complaint: Non-productive cough. Continuously during conversation. Cough keeps her up at night.  ?Symptoms: Mild shortness of breath when coughing ?Frequency: 1-2 weeks ago ?Pertinent Negatives: Patient denies fever ?Disposition: [] ED /[] Urgent Care (no appt availability in office) / [] Appointment(In office/virtual)/ []  [place] [facility]/ [] Home Care/ [] Refused Recommended Disposition /[] [place] [facility]/ []  Follow-up with PCP ?Additional Notes: Declines appointment and states  she cannot due a virtual Asking for cough medication to be sent to pharmacy. Please advise. ?Answer Assessment - Initial Assessment Questions ?1. ONSET: "When did the cough begin?"  ?    Started 1-2 weeks ago ?2. SEVERITY: "How bad is the cough today?"  ?    Severe ?3. SPUTUM: "Describe the color of your sputum" (none, dry cough; clear, white, yellow, green) ?    None ?4. HEMOPTYSIS: "Are you coughing up any blood?" If so ask: "How much?" (flecks, streaks, tablespoons, etc.) ?    None ?5. DIFFICULTY BREATHING: "Are you having difficulty breathing?" If Yes, ask: "How bad is it?" (e.g., mild, moderate, severe)  ?  - MILD: No SOB at rest, mild SOB with walking, speaks normally in sentences, can lie down, no retractions, pulse < 100.  ?  - MODERATE: SOB at rest, SOB with minimal exertion and prefers to sit, cannot lie down flat, speaks in phrases, mild retractions, audible wheezing, pulse 100-120.  ?  - SEVERE: Very SOB at rest, speaks in single words, struggling to breathe, sitting hunched forward, retractions, pulse > 120  ?    Mild ?6. FEVER: "Do you have a fever?" If Yes, ask: "What is your temperature, how was it measured, and when did it start?" ?    No ?7. CARDIAC HISTORY: "Do you have any history of heart disease?" (e.g., heart attack, congestive heart failure)  ?    A fib ?8. LUNG HISTORY: "Do you have any history of lung  disease?"  (e.g., pulmonary embolus, asthma, emphysema) ?    No ?9. PE RISK FACTORS: "Do you have a history of blood clots?" (or: recent major surgery, recent prolonged travel, bedridden) ?    No ?10. OTHER SYMPTOMS: "Do you have any other symptoms?" (e.g., runny nose, wheezing, chest pain) ?      No ?11. PREGNANCY: "Is there any chance you are pregnant?" "When was your last menstrual period?" ?      No ?12. TRAVEL: "Have you traveled out of the country in the last month?" (e.g., travel history, exposures) ?      No ? ?Protocols used: Cough - Acute Non-Productive-A-AH ? ?

## 2021-12-26 NOTE — Telephone Encounter (Signed)
Patient has been advised of Pre-Admission date/time, COVID Testing date and Surgery date. ? ?Surgery Date: 01/09/22 ?Preadmission Testing Date: 01/04/22 (phone 8a-1p) ?Covid Testing Date: Not needed.   ? ?Patient has been made aware to call [phone], between 1-3:00pm the day before surgery, to find out what time to arrive for surgery.   ? ?

## 2021-12-26 NOTE — Progress Notes (Signed)
?12/26/2021 ? ?History of Present Illness: ?Amanda [name] is a 65 y.o. female with a history of acute cholecystitis presenting with sepsis atrial fibrillation hypertension on November 02, 2021.  She had a percutaneous cholecystostomy drain placement on the same day and was discharged from the hospital on 1/19 on Eliquis, Coreg, and diltiazem.  She has seen Dr. Kowalski and he has cleared the patient for surgery from a cardiac standpoint.  The patient had a cholangiogram on 12/11/2021 through the existing drain which showed a patent cystic duct and had a drain exchange at that time. ? ?Today she presents for follow-up and for scheduling for surgery.  She reports that she is feeling little more tired reports that she still continues having swelling in her lower extremities.  She reports that Dr. Kowalski wanted to wait until after surgery to make some adjustments to her medications that can help with the swelling and currently she still wearing her compression stockings.  Denies any abdominal pain, nausea, vomiting.  The drain has been working well and has had low output.  They contribute flushing daily. ? ?Past Medical History: ?Past Medical History:  ?Diagnosis Date  ? Anxiety   ? Atrial fibrillation ([facility])   ? Cholecystitis   ? Diabetes mellitus without complication ([facility])   ? Hyperlipidemia   ? Hypertension   ?  ? ?Past Surgical History: ?Past Surgical History:  ?Procedure Laterality Date  ? COLONOSCOPY  2012  ? IR CHOLANGIOGRAM EXISTING TUBE  12/11/2021  ? IR PERC CHOLECYSTOSTOMY  11/02/2021  ? ? ?Home Medications: ?Prior to Admission medications   ?Medication Sig Start Date End Date Taking? Authorizing Provider  ?apixaban (ELIQUIS) 5 MG TABS tablet Take 1 tablet (5 mg total) by mouth 2 (two) times daily. 12/06/21  Yes Johnson, Megan P, DO  ?Calcium Carb-Cholecalciferol (CALCIUM 600+D3) 600-200 MG-UNIT TABS Take 1 tablet by mouth daily.   Yes [provider]  ?carvedilol (COREG) 12.5 MG tablet Take 1 tablet (12.5  mg total) by mouth 2 (two) times daily with a meal. 12/06/21  Yes Johnson, Megan P, DO  ?diltiazem (CARDIZEM CD) 180 MG 24 hr capsule Take 1 capsule (180 mg total) by mouth daily. 12/06/21  Yes Johnson, Megan P, DO  ?Dulaglutide (TRULICITY) 3 MG/0.5ML SOPN INJECT 1 SYRINGE SUBCUTANEOUSLY ONCE A WEEK ?Strength: 3 MG/0.5ML 11/23/21  Yes Vigg, [name], MD  ?Dulaglutide (TRULICITY) 3 MG/0.5ML SOPN Inject 3 mg as directed once a week. 12/24/21 03/24/22 Yes Vigg, [name], MD  ?Multiple Vitamin (MULTIVITAMIN) tablet Take 1 tablet by mouth daily.   Yes [provider]  ?omeprazole (PRILOSEC) 20 MG capsule Take 20 mg by mouth daily.   Yes [provider]  ?sodium chloride flush (NS) 0.9 % SOLN 5 mLs by Intracatheter route every 8 (eight) hours. 11/06/21 01/05/22 Yes Schulz, Zachary R, PA-C  ?traZODone (DESYREL) 50 MG tablet Take 1 tablet (50 mg total) by mouth at bedtime. 11/08/21  Yes Wieting, Richard, MD  ?vitamin B-12 (CYANOCOBALAMIN) 500 MCG tablet Take by mouth.   Yes [provider]  ? ? ?Allergies: ?Allergies  ?Allergen Reactions  ? Tramadol Other (See Comments)  ?  "woozy"  ? Penicillins Other (See Comments)  ?  Dizziness ?Tolerated augmentin but does not like taste  ? ? ?Review of Systems: ?Review of Systems  ?Constitutional:  Negative for chills and fever.  ?HENT:  Negative for hearing loss.   ?Respiratory:  Negative for shortness of breath.   ?Cardiovascular:  Negative for chest pain.  ?Gastrointestinal:    Negative for abdominal pain, nausea and vomiting.  ?Genitourinary:  Negative for dysuria.  ?Musculoskeletal:  Negative for myalgias.  ?Skin:  Negative for rash.  ?Neurological:  Negative for dizziness.  ?Psychiatric/Behavioral:  Negative for depression.   ? ?Physical Exam ?BP (!) 148/91   Pulse (!) 108   Temp 97.8 ?F (36.6 ?C) (Oral)   Ht 5' 1" (1.549 m)   Wt 192 lb (87.1 kg)   SpO2 97%   BMI 36.28 kg/m?  ?CONSTITUTIONAL: No acute distress, well nourished. ?HEENT:  Normocephalic, atraumatic,  extraocular motion intact. ?NECK: Trachea is midline, no jugular venous distention ?RESPIRATORY:  Very mild wheezing, but otherwise lungs are clear.  Normal respiratory effort without pathologic use of accessory muscles. ?CARDIOVASCULAR: Irregular rhythm, borderline rate.  No murmurs. ?GI: The abdomen is soft, non-distended, non-tender.  Patient has a percutaneous cholecystostomy drain in place without any evidence of infection.  Drain has low volume bilious fluid.  ?MUSCULOSKELETAL: Has slow gait and requires a wheelchair inside her office.  There is peripheral edema bilateral lower extremities. ?NEUROLOGIC:  Motor and sensation is grossly normal.  Cranial nerves are grossly intact. ?PSYCH:  Alert and oriented to person, place and time. Affect is normal. ? ?Labs/Imaging: ?Cholangiogram on 12/11/21: ?IMPRESSION: ?1. Cholangiogram demonstrates appropriate decompression of a stone ?filled gallbladder. The cystic duct is demonstrated to be patent, ?with brisk passage of contrast material through the ampulla into the ?small bowel. No obvious filling defect identified within the common ?bile duct. ?2. Successful exchange of the existing cholecystostomy tube for a ?new similar 12 [place] cholecystostomy tube positioned within the ?gallbladder. Cholecystostomy tube attached to bag drainage. ?3. Further management per surgery. ? ? ?Assessment and Plan: ?This is a 65 y.o. female with history of cholecystitis status post percutaneous cholecystostomy drain placement. ? ?- Discussed with the patient that indeed diltiazem can potentially cause edema as a side effect and if Dr. Kowalski would like to wait until after surgery so there is less stress to her heart while making changes to the diltiazem, then we should go ahead and proceed to schedule surgery.  At this point, has been enough time to allow for the right upper quadrant area to heal and the inflammation to subside after this episode of cholecystitis.  The patient has not  had any issues with the drain recently and her last cholangiogram showed a patent cystic duct. ?- Discussed with the patient and then the plan for a robotic assisted cholecystectomy with ICG cholangiogram and reviewed with her the surgery at length including the risks of bleeding, infection, injury to surrounding structures, that this would be an outpatient procedure, postoperative activity restrictions, and she is willing to proceed.  She understands that we will have to stop her Eliquis 3 days prior to surgery and this has been approved by Dr. Kowalski as well. ?- We will schedule patient for 01/09/2022.  Her last dose of Eliquis would be on 01/06/2022.  We will also send for medical clearance to Dr. Vigg. ? ?I spent 40 minutes dedicated to the care of this patient on the date of this encounter to include pre-visit review of records, face-to-face time with the patient discussing diagnosis and management, and any post-visit coordination of care. ? ? ?Keasia Dubose Luis Otoniel Myhand, MD ?[facility] ? ? ?  ?

## 2021-12-26 NOTE — Patient Instructions (Addendum)
Stop taking the Eliquis on 01/06/22. We will let you know when to restart.  ? ?Medical Clearance faxed to Dr.[name] Vigg today. Please call her office to see if you need to schedule an office visit. ? ?Our surgery scheduler will call you within 24-48 hours to schedule your surgery. Please have the Blue surgery sheet available when speaking with her.  ? ? ?Minimally Invasive Cholecystectomy ?Minimally invasive cholecystectomy is surgery to remove the gallbladder. The gallbladder is a pear-shaped organ that lies beneath the liver on the right side of the body. The gallbladder stores bile, which is a fluid that helps the body digest fats. Cholecystectomy is often done to treat inflammation (irritation and swelling) of the gallbladder (cholecystitis). This condition is usually caused by a buildup of gallstones (cholelithiasis) in the gallbladder or when the fluid in the gall bladder becomes stagnant because gallstones get stuck in the ducts (tubes) and block the flow of bile. This can result in inflammation and pain. In severe cases, emergency surgery may be required. ?This procedure is done through small incisions in the abdomen, instead of one large incision. It is also called laparoscopic surgery. A thin scope with a camera (laparoscope) is inserted through one incision. Then surgical instruments are inserted through the other incisions. In some cases, a minimally invasive surgery may need to be changed to a surgery that is done through a larger incision. This is called open surgery. ?Tell a health care provider about: ?Any allergies you have. ?All medicines you are taking, including vitamins, herbs, eye drops, creams, and over-the-counter medicines. ?Any problems you or family members have had with anesthetic medicines. ?Any bleeding problems you have. ?Any surgeries you have had. ?Any medical conditions you have. ?Whether you are pregnant or may be pregnant. ?What are the risks? ?Generally, this is a safe procedure.  However, problems may occur, including: ?Infection. ?Bleeding. ?Allergic reactions to medicines. ?Damage to nearby structures or organs. ?A gallstone remaining in the common bile duct. The common bile duct carries bile from the gallbladder to the small intestine. ?A bile leak from the liver or cystic duct after your gallbladder is removed. ?What happens before the procedure? ?When to stop eating and drinking ?Follow instructions from your health care provider about what you may eat and drink before your procedure. These may include: ?8 hours before the procedure ?Stop eating most foods. Do not eat meat, fried foods, or fatty foods. ?Eat only light foods, such as toast or crackers. ?All liquids are okay except energy drinks and alcohol. ?6 hours before the procedure ?Stop eating. ?Drink only clear liquids, such as water, clear fruit juice, black coffee, plain tea, and sports drinks. ?Do not drink energy drinks or alcohol. ?2 hours before the procedure ?Stop drinking all liquids. ?You may be allowed to take medicines with small sips of water. ?If you do not follow your health care provider's instructions, your procedure may be delayed or canceled. ?Medicines ?Ask your health care provider about: ?Changing or stopping your regular medicines. This is especially important if you are taking diabetes medicines or blood thinners. ?Taking medicines such as aspirin and ibuprofen. These medicines can thin your blood. Do not take these medicines unless your health care provider tells you to take them. ?Taking over-the-counter medicines, vitamins, herbs, and supplements. ?General instructions ?If you will be going home right after the procedure, plan to have a responsible adult: ?Take you home from the hospital or clinic. You will not be allowed to drive. ?Care for you for  the time you are told. ?Do not use any products that contain nicotine or tobacco for at least 4 weeks before the procedure. These products include cigarettes,  chewing tobacco, and vaping devices, such as e-cigarettes. If you need help quitting, ask your health care provider. ?Ask your health care provider: ?How your surgery site will be marked. ?What steps will be taken to help prevent infection. These may include: ?Removing hair at the surgery site. ?Washing skin with a germ-killing soap. ?Taking antibiotic medicine. ?What happens during the procedure? ? ?An IV will be inserted into one of your veins. ?You will be given one or both of the following: ?A medicine to help you relax (sedative). ?A medicine to make you fall asleep (general anesthetic). ?Your surgeon will make several small incisions in your abdomen. ?The laparoscope will be inserted through one of the small incisions. The camera on the laparoscope will send images to a monitor in the operating room. This lets your surgeon see inside your abdomen. ?A gas will be pumped into your abdomen. This will expand your abdomen to give the surgeon more room to perform the surgery. ?Other tools that are needed for the procedure will be inserted through the other incisions. The gallbladder will be removed through one of the incisions. ?Your common bile duct may be examined. If stones are found in the common bile duct, they may be removed. ?After your gallbladder has been removed, the incisions will be closed with stitches (sutures), staples, or skin glue. ?Your incisions will be covered with a bandage (dressing). ?The procedure may vary among health care providers and hospitals. ?What happens after the procedure? ?Your blood pressure, heart rate, breathing rate, and blood oxygen level will be monitored until you leave the hospital or clinic. ?You will be given medicines as needed to control your pain. ?You may have a drain placed in the incision. The drain will be removed a day or two after the procedure. ?Summary ?Minimally invasive cholecystectomy, also called laparoscopic cholecystectomy, is surgery to remove the  gallbladder using small incisions. ?Tell your health care provider about all the medical conditions you have and all the medicines you are taking for those conditions. ?Before the procedure, follow instructions about when to stop eating and drinking and changing or stopping medicines. ?Plan to have a responsible adult care for you for the time you are told after you leave the hospital or clinic. ?This information is not intended to replace advice given to you by your health care provider. Make sure you discuss any questions you have with your health care provider. ?Document Revised: 04/10/2021 Document Reviewed: 04/10/2021 ?Elsevier Patient Education ? 2022 [employer] ? ?

## 2021-12-26 NOTE — Telephone Encounter (Signed)
Medical Clearance faxed to Dr.[name] Vigg. ?

## 2021-12-26 NOTE — Telephone Encounter (Signed)
Called patient and scheduled her to to a telephone visit with [name] tomorrow. Patient states she has no way to do a video visit.  ?

## 2021-12-27 ENCOUNTER — Ambulatory Visit (INDEPENDENT_AMBULATORY_CARE_PROVIDER_SITE_OTHER): Payer: Medicare HMO | Admitting: Internal Medicine

## 2021-12-27 ENCOUNTER — Encounter: Payer: Self-pay | Admitting: Internal Medicine

## 2021-12-27 DIAGNOSIS — R059 Cough, unspecified: Secondary | ICD-10-CM | POA: Insufficient documentation

## 2021-12-27 MED ORDER — ALBUTEROL SULFATE HFA 108 (90 BASE) MCG/ACT IN AERS: 2.0000 | INHALATION_SPRAY | Freq: Four times a day (QID) | RESPIRATORY_TRACT | 0 refills | Status: AC | PRN

## 2021-12-27 MED ORDER — FEXOFENADINE HCL 180 MG PO TABS: 180.0000 mg | ORAL_TABLET | Freq: Every day | ORAL | 1 refills | Status: DC

## 2021-12-27 MED ORDER — BENZONATATE 100 MG PO CAPS: 100.0000 mg | ORAL_CAPSULE | Freq: Two times a day (BID) | ORAL | 0 refills | Status: DC | PRN

## 2021-12-27 MED ORDER — MONTELUKAST SODIUM 10 MG PO TABS: 10.0000 mg | ORAL_TABLET | Freq: Every day | ORAL | 3 refills | Status: DC

## 2021-12-27 NOTE — Progress Notes (Signed)
? ?There were no vitals taken for this visit.  ? ?Subjective:  ? ? Patient ID: Amanda [name], female    DOB: [birth year], 66 y.o.   MRN: [id number] ? ?Chief Complaint  ?Patient presents with  ? Cough  ?  Started a few weeks ago, getting worse, coughs more at night  ? ? ?HPI: ?Amanda [name] is a 66 y.o. female ? ? ?This visit was completed via telephone due to the restrictions of the COVID-19 pandemic. All issues as above were discussed and addressed but no physical exam was performed. If it was felt that the patient should be evaluated in the office, they were directed there. The patient verbally consented to this visit. Patient was unable to complete an audio/visual visit due to Technical difficulties. Due to the catastrophic nature of the COVID-19 pandemic, this visit was done through audio contact only. ?Location of the patient: home ?Location of the provider: work ?Those involved with this call:  ?Provider: Charlynne Cousins, MD ?CMA: Frazier Butt, CMA ?Front Desk/Registration: [employer]  ?Time spent on call: 10 minutes on the phone discussing health concerns. 10 minutes total spent in review of patient's record and preparation of their chart. ? ? ?Cough: Started a few weeks ago, getting worse, coughs more at night  ? ? ? ?Cough ?This is a new problem. The current episode started in the past 7 days. Associated symptoms include nasal congestion, postnasal drip and shortness of breath. Pertinent negatives include no chest pain, chills, ear congestion, ear pain, fever, headaches, heartburn, hemoptysis, myalgias, rash, rhinorrhea, sore throat, sweats, weight loss or wheezing.  ? ?Chief Complaint  ?Patient presents with  ? Cough  ?  Started a few weeks ago, getting worse, coughs more at night  ? ? ?Relevant past medical, surgical, family and social history reviewed and updated as indicated. Interim medical history since our last visit reviewed. ?Allergies and medications reviewed and updated. ? ?Review of Systems   ?Constitutional:  Negative for chills, fever and weight loss.  ?HENT:  Positive for postnasal drip. Negative for ear pain, rhinorrhea and sore throat.   ?Respiratory:  Positive for cough and shortness of breath. Negative for hemoptysis and wheezing.   ?Cardiovascular:  Negative for chest pain.  ?Gastrointestinal:  Negative for heartburn.  ?Musculoskeletal:  Negative for myalgias.  ?Skin:  Negative for rash.  ?Neurological:  Negative for headaches.  ? ?Per HPI unless specifically indicated above ? ?   ?Objective:  ?  ?There were no vitals taken for this visit.  ?Wt Readings from Last 3 Encounters:  ?12/26/21 192 lb (87.1 kg)  ?11/28/21 180 lb (81.6 kg)  ?11/22/21 183 lb (83 kg)  ?  ?Physical Exam ?Vitals and nursing note reviewed.  ?Constitutional:   ?   General: She is not in acute distress. ?   Appearance: Normal appearance. She is not ill-appearing or diaphoretic.  ?Eyes:  ?   Conjunctiva/sclera: Conjunctivae normal.  ?Cardiovascular:  ?   Rate and Rhythm: Normal rate and regular rhythm.  ?   Heart sounds: No murmur heard. ?  No friction rub. No gallop.  ?Pulmonary:  ?   Effort: No respiratory distress.  ?   Breath sounds: No stridor. No wheezing or rhonchi.  ?Abdominal:  ?   Tenderness: There is no abdominal tenderness. There is no guarding.  ?Skin: ?   General: Skin is warm and dry.  ?   Coloration: Skin is not jaundiced.  ?   Findings: No erythema.  ?Neurological:  ?  Mental Status: She is alert.  ? ? ?Results for orders placed or performed in visit on 11/22/21  ?Bayer DCA Hb A1c Waived (STAT)  ?Result Value Ref Range  ? HB A1C (BAYER DCA - WAIVED) 7.0 (H) 4.8 - 5.6 %  ?CBC with Differential/Platelet  ?Result Value Ref Range  ? WBC 5.9 3.4 - 10.8 x10E3/uL  ? RBC 2.68 (LL) 3.77 - 5.28 x10E6/uL  ? Hemoglobin 8.0 (L) 11.1 - 15.9 g/dL  ? Hematocrit 23.2 (L) 34.0 - 46.6 %  ? MCV 87 79 - 97 fL  ? MCH 29.9 26.6 - 33.0 pg  ? MCHC 34.5 31.5 - 35.7 g/dL  ? RDW 13.0 11.7 - 15.4 %  ? Platelets 278 150 - 450 x10E3/uL  ?  Neutrophils 69 Not Estab. %  ? Lymphs 20 Not Estab. %  ? Monocytes 7 Not Estab. %  ? Eos 2 Not Estab. %  ? Basos 0 Not Estab. %  ? Neutrophils Absolute 4.1 1.4 - 7.0 x10E3/uL  ? Lymphocytes Absolute 1.2 0.7 - 3.1 x10E3/uL  ? Monocytes Absolute 0.4 0.1 - 0.9 x10E3/uL  ? EOS (ABSOLUTE) 0.1 0.0 - 0.4 x10E3/uL  ? Basophils Absolute 0.0 0.0 - 0.2 x10E3/uL  ? Immature Granulocytes 2 Not Estab. %  ? Immature Grans (Abs) 0.1 0.0 - 0.1 x10E3/uL  ?Comprehensive metabolic panel  ?Result Value Ref Range  ? Glucose 164 (H) 70 - 99 mg/dL  ? BUN 12 8 - 27 mg/dL  ? Creatinine, Ser 0.93 0.57 - 1.00 mg/dL  ? eGFR 68 >59 mL/min/1.73  ? BUN/Creatinine Ratio 13 12 - 28  ? Sodium 137 134 - 144 mmol/L  ? Potassium 4.7 3.5 - 5.2 mmol/L  ? Chloride 101 96 - 106 mmol/L  ? CO2 22 20 - 29 mmol/L  ? Calcium 8.4 (L) 8.7 - 10.3 mg/dL  ? Total Protein 5.9 (L) 6.0 - 8.5 g/dL  ? Albumin 3.0 (L) 3.8 - 4.8 g/dL  ? Globulin, Total 2.9 1.5 - 4.5 g/dL  ? Albumin/Globulin Ratio 1.0 (L) 1.2 - 2.2  ? Bilirubin Total 0.4 0.0 - 1.2 mg/dL  ? Alkaline Phosphatase 110 44 - 121 IU/L  ? AST 19 0 - 40 IU/L  ? ALT 16 0 - 32 IU/L  ? ?   ? ? ?Current Outpatient Medications:  ?  albuterol (VENTOLIN HFA) 108 (90 Base) MCG/ACT inhaler, Inhale 2 puffs into the lungs every 6 (six) hours as needed for wheezing or shortness of breath., Disp: 8 g, Rfl: 0 ?  apixaban (ELIQUIS) 5 MG TABS tablet, Take 1 tablet (5 mg total) by mouth 2 (two) times daily., Disp: 180 tablet, Rfl: 1 ?  benzonatate (TESSALON) 100 MG capsule, Take 1 capsule (100 mg total) by mouth 2 (two) times daily as needed for cough., Disp: 20 capsule, Rfl: 0 ?  Calcium Carb-Cholecalciferol (CALCIUM 600+D3) 600-200 MG-UNIT TABS, Take 1 tablet by mouth daily., Disp: , Rfl:  ?  carvedilol (COREG) 12.5 MG tablet, Take 1 tablet (12.5 mg total) by mouth 2 (two) times daily with a meal., Disp: 180 tablet, Rfl: 1 ?  diltiazem (CARDIZEM CD) 180 MG 24 hr capsule, Take 1 capsule (180 mg total) by mouth daily., Disp: 90  capsule, Rfl: 1 ?  Dulaglutide (TRULICITY) 3 YW/7.3XT SOPN, INJECT 1 SYRINGE SUBCUTANEOUSLY ONCE A WEEK Strength: 3 MG/0.5ML, Disp: 12 mL, Rfl: 0 ?  Dulaglutide (TRULICITY) 3 GG/2.6RS SOPN, Inject 3 mg as directed once a week., Disp: 6 mL, Rfl: 3 ?  fexofenadine (ALLEGRA ALLERGY) 180 MG tablet, Take 1 tablet (180 mg total) by mouth daily., Disp: 10 tablet, Rfl: 1 ?  montelukast (SINGULAIR) 10 MG tablet, Take 1 tablet (10 mg total) by mouth at bedtime., Disp: 30 tablet, Rfl: 3 ?  Multiple Vitamin (MULTIVITAMIN) tablet, Take 1 tablet by mouth daily., Disp: , Rfl:  ?  omeprazole (PRILOSEC) 20 MG capsule, Take 20 mg by mouth daily., Disp: , Rfl:  ?  traZODone (DESYREL) 50 MG tablet, Take 1 tablet (50 mg total) by mouth at bedtime., Disp: 30 tablet, Rfl: 0 ?  vitamin B-12 (CYANOCOBALAMIN) 500 MCG tablet, Take by mouth., Disp: , Rfl:   ? ? ?Assessment & Plan:  ?URI: Flu and strep ordered at this visit both negative. ? pt advised to take Tylenol q 4- 6 hourly as needed. pt to take allegra q pm as needed and to call office if symptoms worsened pt verbalised understanding of such. ? ? ? ?Problem List Items Addressed This Visit   ? ?  ? Other  ? Cough - Primary  ?  ? ?No orders of the defined types were placed in this encounter. ?  ? ?Meds ordered this encounter  ?Medications  ? fexofenadine (ALLEGRA ALLERGY) 180 MG tablet  ?  Sig: Take 1 tablet (180 mg total) by mouth daily.  ?  Dispense:  10 tablet  ?  Refill:  1  ? montelukast (SINGULAIR) 10 MG tablet  ?  Sig: Take 1 tablet (10 mg total) by mouth at bedtime.  ?  Dispense:  30 tablet  ?  Refill:  3  ? albuterol (VENTOLIN HFA) 108 (90 Base) MCG/ACT inhaler  ?  Sig: Inhale 2 puffs into the lungs every 6 (six) hours as needed for wheezing or shortness of breath.  ?  Dispense:  8 g  ?  Refill:  0  ? benzonatate (TESSALON) 100 MG capsule  ?  Sig: Take 1 capsule (100 mg total) by mouth 2 (two) times daily as needed for cough.  ?  Dispense:  20 capsule  ?  Refill:  0  ?  ? ?Follow  up plan: ?No follow-ups on file. ?

## 2022-01-03 ENCOUNTER — Ambulatory Visit: Payer: Medicare HMO | Admitting: Internal Medicine

## 2022-01-04 ENCOUNTER — Other Ambulatory Visit: Payer: Self-pay

## 2022-01-04 ENCOUNTER — Ambulatory Visit
Admission: RE | Admit: 2022-01-04 | Discharge: 2022-01-04 | Disposition: A | Discharge status: Home / Self Care | Payer: Medicare HMO | Source: Ambulatory Visit | Attending: Urgent Care | Admitting: Urgent Care

## 2022-01-04 ENCOUNTER — Inpatient Hospital Stay
Admission: EM | Admit: 2022-01-04 | Discharge: 2022-01-08 | DRG: 291 | Disposition: A | Discharge status: Home Health | Payer: Medicare HMO | Attending: Internal Medicine | Admitting: Internal Medicine

## 2022-01-04 ENCOUNTER — Emergency Department: Payer: Medicare HMO

## 2022-01-04 ENCOUNTER — Ambulatory Visit: Admission: RE | Admit: 2022-01-04 | Payer: Medicare HMO | Source: Ambulatory Visit

## 2022-01-04 ENCOUNTER — Other Ambulatory Visit
Admission: RE | Admit: 2022-01-04 | Discharge: 2022-01-04 | Disposition: A | Discharge status: Home / Self Care | Payer: Medicare HMO | Source: Ambulatory Visit | Attending: Surgery | Admitting: Surgery

## 2022-01-04 DIAGNOSIS — Z833 Family history of diabetes mellitus: Secondary | ICD-10-CM

## 2022-01-04 DIAGNOSIS — Z79899 Other long term (current) drug therapy: Secondary | ICD-10-CM

## 2022-01-04 DIAGNOSIS — E785 Hyperlipidemia, unspecified: Secondary | ICD-10-CM | POA: Diagnosis not present

## 2022-01-04 DIAGNOSIS — I480 Paroxysmal atrial fibrillation: Secondary | ICD-10-CM | POA: Diagnosis not present

## 2022-01-04 DIAGNOSIS — T502X5A Adverse effect of carbonic-anhydrase inhibitors, benzothiadiazides and other diuretics, initial encounter: Secondary | ICD-10-CM | POA: Diagnosis not present

## 2022-01-04 DIAGNOSIS — R0600 Dyspnea, unspecified: Secondary | ICD-10-CM

## 2022-01-04 DIAGNOSIS — I10 Essential (primary) hypertension: Secondary | ICD-10-CM | POA: Diagnosis not present

## 2022-01-04 DIAGNOSIS — J9601 Acute respiratory failure with hypoxia: Secondary | ICD-10-CM

## 2022-01-04 DIAGNOSIS — Z87891 Personal history of nicotine dependence: Secondary | ICD-10-CM | POA: Diagnosis not present

## 2022-01-04 DIAGNOSIS — D649 Anemia, unspecified: Secondary | ICD-10-CM | POA: Diagnosis present

## 2022-01-04 DIAGNOSIS — M1990 Unspecified osteoarthritis, unspecified site: Secondary | ICD-10-CM | POA: Diagnosis present

## 2022-01-04 DIAGNOSIS — R059 Cough, unspecified: Secondary | ICD-10-CM | POA: Diagnosis not present

## 2022-01-04 DIAGNOSIS — R0602 Shortness of breath: Secondary | ICD-10-CM | POA: Diagnosis not present

## 2022-01-04 DIAGNOSIS — E669 Obesity, unspecified: Secondary | ICD-10-CM | POA: Diagnosis present

## 2022-01-04 DIAGNOSIS — I517 Cardiomegaly: Secondary | ICD-10-CM | POA: Diagnosis not present

## 2022-01-04 DIAGNOSIS — I340 Nonrheumatic mitral (valve) insufficiency: Secondary | ICD-10-CM | POA: Diagnosis not present

## 2022-01-04 DIAGNOSIS — I4819 Other persistent atrial fibrillation: Secondary | ICD-10-CM | POA: Diagnosis not present

## 2022-01-04 DIAGNOSIS — I509 Heart failure, unspecified: Secondary | ICD-10-CM

## 2022-01-04 DIAGNOSIS — Z7901 Long term (current) use of anticoagulants: Secondary | ICD-10-CM | POA: Diagnosis not present

## 2022-01-04 DIAGNOSIS — Z825 Family history of asthma and other chronic lower respiratory diseases: Secondary | ICD-10-CM | POA: Diagnosis not present

## 2022-01-04 DIAGNOSIS — J90 Pleural effusion, not elsewhere classified: Secondary | ICD-10-CM

## 2022-01-04 DIAGNOSIS — I161 Hypertensive emergency: Secondary | ICD-10-CM | POA: Diagnosis not present

## 2022-01-04 DIAGNOSIS — J811 Chronic pulmonary edema: Secondary | ICD-10-CM | POA: Diagnosis not present

## 2022-01-04 DIAGNOSIS — K819 Cholecystitis, unspecified: Secondary | ICD-10-CM | POA: Diagnosis present

## 2022-01-04 DIAGNOSIS — I110 Hypertensive heart disease with heart failure: Secondary | ICD-10-CM | POA: Diagnosis not present

## 2022-01-04 DIAGNOSIS — M7989 Other specified soft tissue disorders: Secondary | ICD-10-CM | POA: Diagnosis not present

## 2022-01-04 DIAGNOSIS — Z6836 Body mass index (BMI) 36.0-36.9, adult: Secondary | ICD-10-CM

## 2022-01-04 DIAGNOSIS — Z434 Encounter for attention to other artificial openings of digestive tract: Secondary | ICD-10-CM

## 2022-01-04 DIAGNOSIS — E876 Hypokalemia: Secondary | ICD-10-CM | POA: Diagnosis not present

## 2022-01-04 DIAGNOSIS — Z7985 Long-term (current) use of injectable non-insulin antidiabetic drugs: Secondary | ICD-10-CM

## 2022-01-04 DIAGNOSIS — R918 Other nonspecific abnormal finding of lung field: Secondary | ICD-10-CM

## 2022-01-04 DIAGNOSIS — K219 Gastro-esophageal reflux disease without esophagitis: Secondary | ICD-10-CM | POA: Diagnosis present

## 2022-01-04 DIAGNOSIS — J189 Pneumonia, unspecified organism: Secondary | ICD-10-CM | POA: Diagnosis not present

## 2022-01-04 DIAGNOSIS — R400 Somnolence: Secondary | ICD-10-CM | POA: Insufficient documentation

## 2022-01-04 DIAGNOSIS — I5033 Acute on chronic diastolic (congestive) heart failure: Secondary | ICD-10-CM | POA: Diagnosis present

## 2022-01-04 DIAGNOSIS — K8010 Calculus of gallbladder with chronic cholecystitis without obstruction: Secondary | ICD-10-CM | POA: Diagnosis not present

## 2022-01-04 DIAGNOSIS — E1165 Type 2 diabetes mellitus with hyperglycemia: Secondary | ICD-10-CM | POA: Diagnosis not present

## 2022-01-04 DIAGNOSIS — Z8249 Family history of ischemic heart disease and other diseases of the circulatory system: Secondary | ICD-10-CM | POA: Diagnosis not present

## 2022-01-04 DIAGNOSIS — I5031 Acute diastolic (congestive) heart failure: Secondary | ICD-10-CM | POA: Diagnosis not present

## 2022-01-04 DIAGNOSIS — I4891 Unspecified atrial fibrillation: Secondary | ICD-10-CM | POA: Insufficient documentation

## 2022-01-04 DIAGNOSIS — Z20822 Contact with and (suspected) exposure to covid-19: Secondary | ICD-10-CM | POA: Diagnosis present

## 2022-01-04 HISTORY — DX: Gastro-esophageal reflux disease without esophagitis: K21.9

## 2022-01-04 HISTORY — DX: Unspecified osteoarthritis, unspecified site: M19.90

## 2022-01-04 HISTORY — DX: Dyspnea, unspecified: R06.00

## 2022-01-04 LAB — CBC
HCT: 34.1 % — ABNORMAL LOW (ref 36.0–46.0)
Hemoglobin: 10.2 g/dL — ABNORMAL LOW (ref 12.0–15.0)
MCH: 25.8 pg — ABNORMAL LOW (ref 26.0–34.0)
MCHC: 29.9 g/dL — ABNORMAL LOW (ref 30.0–36.0)
MCV: 86.3 fL (ref 80.0–100.0)
Platelets: 281 10*3/uL (ref 150–400)
RBC: 3.95 MIL/uL (ref 3.87–5.11)
RDW: 15.2 % (ref 11.5–15.5)
WBC: 5.9 10*3/uL (ref 4.0–10.5)
nRBC: 0 % (ref 0.0–0.2)

## 2022-01-04 LAB — COMPREHENSIVE METABOLIC PANEL
ALT: 12 U/L (ref 0–44)
AST: 22 U/L (ref 15–41)
Albumin: 3.3 g/dL — ABNORMAL LOW (ref 3.5–5.0)
Alkaline Phosphatase: 83 U/L (ref 38–126)
Anion gap: 13 (ref 5–15)
BUN: 18 mg/dL (ref 8–23)
CO2: 21 mmol/L — ABNORMAL LOW (ref 22–32)
Calcium: 8.9 mg/dL (ref 8.9–10.3)
Chloride: 105 mmol/L (ref 98–111)
Creatinine, Ser: 0.92 mg/dL (ref 0.44–1.00)
GFR, Estimated: >60 mL/min (ref >60)
Glucose, Bld: 201 mg/dL — ABNORMAL HIGH (ref 70–99)
Potassium: 3.6 mmol/L (ref 3.5–5.1)
Sodium: 139 mmol/L (ref 135–145)
Total Bilirubin: 0.7 mg/dL (ref 0.3–1.2)
Total Protein: 7.8 g/dL (ref 6.5–8.1)

## 2022-01-04 LAB — BASIC METABOLIC PANEL
Anion gap: 10 (ref 5–15)
BUN: 19 mg/dL (ref 8–23)
CO2: 25 mmol/L (ref 22–32)
Calcium: 8.8 mg/dL — ABNORMAL LOW (ref 8.9–10.3)
Chloride: 104 mmol/L (ref 98–111)
Creatinine, Ser: 0.86 mg/dL (ref 0.44–1.00)
GFR, Estimated: >60 mL/min (ref >60)
Glucose, Bld: 228 mg/dL — ABNORMAL HIGH (ref 70–99)
Potassium: 3.3 mmol/L — ABNORMAL LOW (ref 3.5–5.1)
Sodium: 139 mmol/L (ref 135–145)

## 2022-01-04 LAB — RESP PANEL BY RT-PCR (FLU A&B, COVID) ARPGX2
Influenza A by PCR: NEGATIVE
Influenza B by PCR: NEGATIVE
SARS Coronavirus 2 by RT PCR: NEGATIVE

## 2022-01-04 LAB — TSH: TSH: 3.901 u[IU]/mL (ref 0.350–4.500)

## 2022-01-04 LAB — BRAIN NATRIURETIC PEPTIDE: B Natriuretic Peptide: 537.6 pg/mL — ABNORMAL HIGH (ref 0.0–100.0)

## 2022-01-04 LAB — TROPONIN I (HIGH SENSITIVITY): Troponin I (High Sensitivity): 9 ng/L (ref <18)

## 2022-01-04 LAB — CBG MONITORING, ED
Glucose-Capillary: 215 mg/dL — ABNORMAL HIGH (ref 70–99)
Glucose-Capillary: 208 mg/dL — ABNORMAL HIGH (ref 70–99)

## 2022-01-04 MED ORDER — IOHEXOL 350 MG/ML SOLN
80.0000 mL | Freq: Once | INTRAVENOUS | Status: AC | PRN
  Administered 2022-01-04: 75 mL via INTRAVENOUS

## 2022-01-04 MED ORDER — INSULIN ASPART 100 UNIT/ML IJ SOLN
0.0000 [IU] | Freq: Every day | INTRAMUSCULAR | Status: DC
  Administered 2022-01-04: 2 [IU] via SUBCUTANEOUS
  Filled 2022-01-04: qty 1

## 2022-01-04 MED ORDER — SODIUM CHLORIDE 0.9 % IV SOLN
500.0000 mg | Freq: Once | INTRAVENOUS | Status: AC
  Administered 2022-01-04: 500 mg via INTRAVENOUS
  Filled 2022-01-04: qty 5

## 2022-01-04 MED ORDER — CARVEDILOL 12.5 MG PO TABS
12.5000 mg | ORAL_TABLET | Freq: Two times a day (BID) | ORAL | Status: DC
Start: 2022-01-05 — End: 2022-01-08
  Administered 2022-01-05 – 2022-01-08 (×7): 12.5 mg via ORAL
  Filled 2022-01-04 (×2): qty 1
  Filled 2022-01-04: qty 2
  Filled 2022-01-04 (×2): qty 1
  Filled 2022-01-04: qty 2
  Filled 2022-01-04: qty 1

## 2022-01-04 MED ORDER — ACETAMINOPHEN 325 MG PO TABS
650.0000 mg | ORAL_TABLET | Freq: Four times a day (QID) | ORAL | Status: DC | PRN
  Administered 2022-01-08: 650 mg via ORAL
  Filled 2022-01-04: qty 2

## 2022-01-04 MED ORDER — ALBUTEROL SULFATE (2.5 MG/3ML) 0.083% IN NEBU: 3.0000 mL | INHALATION_SOLUTION | Freq: Four times a day (QID) | RESPIRATORY_TRACT | Status: DC | PRN

## 2022-01-04 MED ORDER — FUROSEMIDE 10 MG/ML IJ SOLN
40.0000 mg | Freq: Once | INTRAMUSCULAR | Status: AC
  Administered 2022-01-04: 40 mg via INTRAVENOUS
  Filled 2022-01-04: qty 4

## 2022-01-04 MED ORDER — CEFTRIAXONE SODIUM 2 G IJ SOLR
2.0000 g | Freq: Once | INTRAMUSCULAR | Status: AC
  Administered 2022-01-04: 2 g via INTRAVENOUS
  Filled 2022-01-04: qty 20

## 2022-01-04 MED ORDER — ACETAMINOPHEN 650 MG RE SUPP: 650.0000 mg | Freq: Four times a day (QID) | RECTAL | Status: DC | PRN

## 2022-01-04 MED ORDER — INSULIN ASPART 100 UNIT/ML IJ SOLN
0.0000 [IU] | Freq: Three times a day (TID) | INTRAMUSCULAR | Status: DC
  Administered 2022-01-05 (×3): 3 [IU] via SUBCUTANEOUS
  Administered 2022-01-06: 4 [IU] via SUBCUTANEOUS
  Administered 2022-01-06: 3 [IU] via SUBCUTANEOUS
  Administered 2022-01-07: 7 [IU] via SUBCUTANEOUS
  Administered 2022-01-07 – 2022-01-08 (×2): 3 [IU] via SUBCUTANEOUS
  Filled 2022-01-04 (×8): qty 1

## 2022-01-04 MED ORDER — ONDANSETRON HCL 4 MG PO TABS: 4.0000 mg | ORAL_TABLET | Freq: Four times a day (QID) | ORAL | Status: DC | PRN

## 2022-01-04 MED ORDER — HYDRALAZINE HCL 20 MG/ML IJ SOLN: 10.0000 mg | Freq: Four times a day (QID) | INTRAMUSCULAR | Status: DC | PRN

## 2022-01-04 MED ORDER — APIXABAN 5 MG PO TABS
5.0000 mg | ORAL_TABLET | Freq: Two times a day (BID) | ORAL | Status: AC
  Administered 2022-01-04 – 2022-01-06 (×5): 5 mg via ORAL
  Filled 2022-01-04 (×5): qty 1

## 2022-01-04 MED ORDER — ONDANSETRON HCL 4 MG/2ML IJ SOLN: 4.0000 mg | Freq: Four times a day (QID) | INTRAMUSCULAR | Status: DC | PRN

## 2022-01-04 MED ORDER — FUROSEMIDE 10 MG/ML IJ SOLN
40.0000 mg | Freq: Two times a day (BID) | INTRAMUSCULAR | Status: DC
  Administered 2022-01-05 – 2022-01-08 (×7): 40 mg via INTRAVENOUS
  Filled 2022-01-04 (×7): qty 4

## 2022-01-04 NOTE — Pre-Procedure Instructions (Signed)
Pre admission phone call completed with patient as scheduled, it was noted by this writer that patient has frequent cough with SOB with conversation, patient reports that she has had increase edema in her legs and is in her abdomin, she reports that she thinks that the edema is the cause of her cough. We noted that her HGB on 01/19 was 10.7 and on 02/02 it was 8.0, she is also noted to have a 12 pound weight gain from 02/08 to 03/08. Dr. Sherle Poe, Dr. Aleen Campi and Waynetta Sandy NP made aware. She was called by this Clinical research associate and we informed her that she will need to come to have labs and chest xray asap. This Clinical research associate is waiting for her to conform that she can come in for these today. ?

## 2022-01-04 NOTE — Assessment & Plan Note (Addendum)
Home medication list showed that patient is supposed to be on Trulicity.  HbA1c 6.9 in January.   ?

## 2022-01-04 NOTE — Patient Instructions (Signed)
Your procedure is scheduled on: 01/09/22 - Wednesday ?Report to the Registration Desk on the 1st floor of the [facility]. ?To find out your arrival time, please call [phone] between 1PM - 3PM on: 01/08/22 - Tuesday ? ?REMEMBER: ?Instructions that are not followed completely may result in serious medical risk, up to and including death; or upon the discretion of your surgeon and anesthesiologist your surgery may need to be rescheduled. ? ?Do not eat food after midnight the night before surgery.  ?No gum chewing, lozengers or hard candies. ? ?You may however, drink CLEAR liquids up to 2 hours before you are scheduled to arrive for your surgery. Do not drink anything within 2 hours of your scheduled arrival time. ? ?Clear liquids include: ?- water  ?Type 1 and Type 2 diabetics should only drink water. ? ?TAKE THESE MEDICATIONS THE MORNING OF SURGERY WITH A SIP OF WATER: ? ?- carvedilol (COREG) 12.5 MG tablet ?- diltiazem (CARDIZEM CD) 180 MG 24 hr capsule ?- omeprazole (PRILOSEC) 20 MG capsule, (take one the night before and one on the morning of surgery - helps to prevent nausea after surgery.) ? ?Use albuterol (VENTOLIN HFA) 108 (90 Base) MCG/ACT inhaler on the day of surgery and bring to the hospital. ? ?Follow recommendations from Cardiologist, Pulmonologist or PCP regarding stopping Aspirin, Coumadin, Plavix, Eliquis, Pradaxa, or Pletal. Stop taking Eliquis beginning 03/19. ? ?One week prior to surgery: ?Stop Anti-inflammatories (NSAIDS) such as Advil, Aleve, Ibuprofen, Motrin, Naproxen, Naprosyn and Aspirin based products such as Excedrin, Goodys Powder, BC Powder. ? ?Stop ANY OVER THE COUNTER supplements until after surgery. ? ?You may however, continue to take Tylenol if needed for pain up until the day of surgery. ? ?No Alcohol for 24 hours before or after surgery. ? ?No Smoking including e-cigarettes for 24 hours prior to surgery.  ?No chewable tobacco products for at least 6 hours prior to surgery.   ?No nicotine patches on the day of surgery. ? ?Do not use any "recreational" drugs for at least a week prior to your surgery.  ?Please be advised that the combination of cocaine and anesthesia may have negative outcomes, up to and including death. ?If you test positive for cocaine, your surgery will be cancelled. ? ?On the morning of surgery brush your teeth with toothpaste and water, you may rinse your mouth with mouthwash if you wish. ?Do not swallow any toothpaste or mouthwash. ? ?Use CHG Soap or wipes as directed on instruction sheet. ? ?Do not wear jewelry, make-up, hairpins, clips or nail polish. ? ?Do not wear lotions, powders, or perfumes.  ? ?Do not shave body from the neck down 48 hours prior to surgery just in case you cut yourself which could leave a site for infection.  ?Also, freshly shaved skin may become irritated if using the CHG soap. ? ?Contact lenses, hearing aids and dentures may not be worn into surgery. ? ?Do not bring valuables to the hospital. [facility] is not responsible for any missing/lost belongings or valuables.  ? ?Notify your doctor if there is any change in your medical condition (cold, fever, infection). ? ?Wear comfortable clothing (specific to your surgery type) to the hospital. ? ?After surgery, you can help prevent lung complications by doing breathing exercises.  ?Take deep breaths and cough every 1-2 hours. Your doctor may order a device called an Incentive Spirometer to help you take deep breaths. ?When coughing or sneezing, hold a pillow firmly against your incision with both hands. This  is called ?splinting.? Doing this helps protect your incision. It also decreases belly discomfort. ? ?If you are being admitted to the hospital overnight, leave your suitcase in the car. ?After surgery it may be brought to your room. ? ?If you are being discharged the day of surgery, you will not be allowed to drive home. ?You will need a responsible adult (18 years or older) to drive you  home and stay with you that night.  ? ?If you are taking public transportation, you will need to have a responsible adult (18 years or older) with you. ?Please confirm with your physician that it is acceptable to use public transportation.  ? ?Please call the Pre-admissions Testing Dept. at [phone] if you have any questions about these instructions. ? ?Surgery Visitation Policy: ? ?Patients undergoing a surgery or procedure may have one family member or support person with them as long as that person is not COVID-19 positive or experiencing its symptoms.  ?That person may remain in the waiting area during the procedure and may rotate out with other people. ? ?Inpatient Visitation:   ? ?Visiting hours are 7 a.m. to 8 p.m. ?Up to two visitors ages 16+ are allowed at one time in a patient room. The visitors may rotate out with other people during the day. Visitors must check out when they leave, or other visitors will not be allowed. One designated support person may remain overnight. ?The visitor must pass COVID-19 screenings, use hand sanitizer when entering and exiting the patient?s room and wear a mask at all times, including in the patient?s room. ?Patients must also wear a mask when staff or their visitor are in the room. ?Masking is required regardless of vaccination status.  ?

## 2022-01-04 NOTE — ED Notes (Signed)
Pharmacy with pt ?

## 2022-01-04 NOTE — Assessment & Plan Note (Addendum)
Blood pressure improved with diuresis.  Continued on her home medications as well. ?

## 2022-01-04 NOTE — Progress Notes (Addendum)
?Perioperative Services ?Pre-Admission/Anesthesia Testing ? ?  ?Date: 01/04/22 ? ?Name: Amanda [name] ?MRN:   [id number] ? ?Re: SOB, edema, and need for further evaluation prior to surgery ? ?Planned Surgical Procedure(s):  ? ? Case: [id number] Date/Time: 01/09/22 1245  ? Procedures:  ?    XI ROBOTIC ASSISTED LAPAROSCOPIC CHOLECYSTECTOMY ?    INDOCYANINE GREEN FLUORESCENCE IMAGING (ICG)  ? Anesthesia type: General  ? Pre-op diagnosis: cholecystitis  ? Location: [facility] OR ROOM 06 / [facility]  ? Surgeons: Olean Ree, MD  ? ?Clinical Notes:  ?Patient scheduled for the above procedure on 01/09/2022 with Dr. Bennett Scrape? Piscoya, MD.  During her PAT interview with clinical staff today, PAT RN astutely appreciated the fact that patient was significantly short of breath while speaking.  Patient advising that she has had cough, edema, and shortness of breath over the course of the last month or so.  She has been seen by her PCP and prescribed Tessalon and albuterol, neither of which have been effective.  In review of her labs, it was noted that patient had a hemoglobin of 10.7 g/dL on 11/08/2021, but was then 8.0 g/dL when checked on 11/22/2021.  Patient denies any obvious signs of bleeding; no melena, hematochezia, or hematemesis.  Additionally, patient with significant weight gain.  Per RN, patient's documented weight between 11/28/2021 and 12/26/2021 represented a 12-15 pound weight gain.  RN reached out to PCP via secure chat to discuss, however unsure if PCP is in the office today as no response was received.  Per my direction, RN reached out to primary attending surgeon Hampton Abbot, MD) to discuss patient's clinical condition.  Of note, patient has been cleared by cardiology for surgery next week.  Surgeon aware of edema issues and notes that cardiology plans to address following surgery.  I discussed my plans with Dr. Hampton Abbot about having patient come in today for repeat CBC, BMP, and chest x-[place] to further evaluate.   MD agrees with plan as discussed. ? ?Patient arrived to PAT clinic via wheelchair.  Patient had family member bring her into the clinic today.  Patient noted to be somnolent, yet easily arousable to soft verbal stimulation.  Patient with panting breathing pattern.  When speaking, patient obviously breathless/short of breath.  SPO2 on room air was consistently 89 to 90%.  Patient with significant edema extending from her feet all the way up to her abdomen.  Legs and abdomen are tight.  Patient with intermittent dry coughing.  Given overall clinical picture, recommendations were for patient to be seen and further evaluated in the emergency department here at [facility]  Patient hesitant, however advised that given her current clinical state, it was in her best interest.  Patient agreed.  Patient taken to the ED via wheelchair by PAT staff. ? ?Communication sent to primary attending surgeon Hampton Abbot, MD) to make him aware of the patient's clinical status.  MD agrees with decision for patient to be seen in the ED today.  Report called to ED charge nurse Aldona Bar, RN).  Additionally, when I took patient over to the ED, patient report was also given to person nurse in efforts to ensure that patient's triage included appropriate clinical data as it is available to me at this time.  Depending on what is determined during her course in the ED, patient's surgical procedure next week may need to be postponed.  We will plan on following up with patient's ED course and overall disposition. ? ?Honor Loh, MSN, APRN,  FNP-C, CEN ?[place]  ?Peri-operative Services Nurse Practitioner ?Phone: [phone] ?01/04/22 4:43 PM ? ?NOTE: This note has been prepared using Lobby[employer]. Despite my best ability to proofread, there is always the potential that unintentional transcriptional errors may still occur from this process. ? ?

## 2022-01-04 NOTE — Assessment & Plan Note (Addendum)
Secondary to CHF pleural effusion.  Initially was requiring 4 L of oxygen.  Wean down to 2 L and then transitioned to room air.  Saturating in the 90s. ?

## 2022-01-04 NOTE — ED Notes (Signed)
Purewick placed on pt. 

## 2022-01-04 NOTE — ED Notes (Signed)
Pt bladder scanned- 780 ml's or urine retaining. EDP notified.  ?

## 2022-01-04 NOTE — ED Notes (Signed)
Pt on 3 liter's from the lobby, increased to 4 liter's at this time. Oxygen use is acute per pt. Pt c/o dry cough and SOB. Pt denies pain.  ?

## 2022-01-04 NOTE — Assessment & Plan Note (Addendum)
See note under atrial fibrillation for details.  Resume Eliquis when told to do so by general surgery after her cholecystectomy ?

## 2022-01-04 NOTE — ED Provider Notes (Signed)
? ?[facility] ?Provider Note ? ? ? Event Date/Time  ? First MD Initiated Contact with Patient 01/04/22 2028   ?  (approximate) ? ? ?History  ? ?Shortness of Breath ? ? ?HPI ? ?Amanda [name] is a 66 y.o. female with a past history of atrial fibrillation, diabetes and GERD who comes the ED complaining of worsening shortness of breath for the past few weeks, associated with cough.  Several weeks ago she had cholecystitis, had a percutaneous cholecystostomy tube placed, was supposed to come back to the hospital today for interval cholecystectomy.  However she was noted to be very short of breath and tachypneic and sent to the ED for further evaluation. ? ? ?  ? ? ?Physical Exam  ? ?Triage Vital Signs: ?ED Triage Vitals  ?Enc Vitals Group  ?   BP 01/04/22 1709 (!) 181/89  ?   Pulse Rate 01/04/22 1706 96  ?   Resp 01/04/22 1706 (!) 30  ?   Temp 01/04/22 1735 97.9 ?F (36.6 ?C)  ?   Temp Source 01/04/22 1735 Oral  ?   SpO2 01/04/22 1706 (!) 86 %  ?   Weight 01/04/22 1706 192 lb (87.1 kg)  ?   Height 01/04/22 1706 5\' 1"  (1.549 m)  ?   Head Circumference --   ?   Peak Flow --   ?   Pain Score 01/04/22 1706 0  ?   Pain Loc --   ?   Pain Edu? --   ?   Excl. in [place]? --   ? ? ?Most recent vital signs: ?Vitals:  ? 01/04/22 2100 01/04/22 2130  ?BP: (!) 133/99 140/86  ?Pulse: 92 93  ?Resp: (!) 33 (!) 29  ?Temp:    ?SpO2: 100% 100%  ? ? ? ?General: Awake, no distress.  Mild respiratory distress ?CV:  Good peripheral perfusion.  Tachycardia heart rate 105 ?Resp:  Tachypnea with a respiratory rate of 30.  Hypoxia with oxygen saturation of 84% on room air, normalized on 2 L nasal cannula.  Diffuse crackles without focal changes on auscultation ?Abd:  No distention.  Soft and nontender ?Other:  3+ pitting edema bilateral lower extremities.  No calf tenderness, no calf asymmetry.  No rash ? ? ?ED Results / Procedures / Treatments  ? ?Labs ?(all labs ordered are listed, but only abnormal results are displayed) ?Labs  Reviewed  ?BASIC METABOLIC PANEL - Abnormal; Notable for the following components:  ?    Result Value  ? Potassium 3.3 (*)   ? Glucose, Bld 228 (*)   ? Calcium 8.8 (*)   ? All other components within normal limits  ?COMPREHENSIVE METABOLIC PANEL - Abnormal; Notable for the following components:  ? CO2 21 (*)   ? Glucose, Bld 201 (*)   ? Albumin 3.3 (*)   ? All other components within normal limits  ?CBG MONITORING, ED - Abnormal; Notable for the following components:  ? Glucose-Capillary 215 (*)   ? All other components within normal limits  ?CBG MONITORING, ED - Abnormal; Notable for the following components:  ? Glucose-Capillary 208 (*)   ? All other components within normal limits  ?RESP PANEL BY RT-PCR (FLU A&B, COVID) ARPGX2  ?TSH  ?PROCALCITONIN  ?BASIC METABOLIC PANEL  ?CBC  ?TROPONIN I (HIGH SENSITIVITY)  ? ? ? ?EKG ? ?Interpreted by me ?Atrial fibrillation, rate of 96.  Normal axis, normal intervals.  Poor R wave progression.  Normal ST segments and T  waves.  No ischemic changes. ? ? ?RADIOLOGY ?Chest x-Riker viewed and interpreted by me, shows some pulmonary edema without focal consolidation.  Radiology report reviewed. ? ?CT angiogram confirms pulmonary edema.  No convincing infectious process.  No PE. ? ? ? ?PROCEDURES: ? ?Critical Care performed: No ? ?Procedures ? ? ?MEDICATIONS ORDERED IN ED: ?Medications  ?carvedilol (COREG) tablet 12.5 mg (has no administration in time range)  ?apixaban (ELIQUIS) tablet 5 mg (5 mg Oral Given 01/04/22 2239)  ?albuterol (PROVENTIL) (2.5 MG/3ML) 0.083% nebulizer solution 3 mL (has no administration in time range)  ?hydrALAZINE (APRESOLINE) injection 10 mg (has no administration in time range)  ?acetaminophen (TYLENOL) tablet 650 mg (has no administration in time range)  ?  Or  ?acetaminophen (TYLENOL) suppository 650 mg (has no administration in time range)  ?ondansetron (ZOFRAN) tablet 4 mg (has no administration in time range)  ?  Or  ?ondansetron (ZOFRAN) injection 4  mg (has no administration in time range)  ?furosemide (LASIX) injection 40 mg (has no administration in time range)  ?insulin aspart (novoLOG) injection 0-5 Units (2 Units Subcutaneous Given 01/04/22 2239)  ?insulin aspart (novoLOG) injection 0-20 Units (has no administration in time range)  ?furosemide (LASIX) injection 40 mg (40 mg Intravenous Given 01/04/22 1851)  ?iohexol (OMNIPAQUE) 350 MG/ML injection 80 mL (75 mLs Intravenous Contrast Given 01/04/22 1920)  ?cefTRIAXone (ROCEPHIN) 2 g in sodium chloride 0.9 % 100 mL IVPB (0 g Intravenous Stopped 01/04/22 2007)  ?azithromycin (ZITHROMAX) 500 mg in sodium chloride 0.9 % 250 mL IVPB (0 mg Intravenous Stopped 01/04/22 2116)  ? ? ? ?IMPRESSION / MDM / ASSESSMENT AND PLAN / ED COURSE  ?I reviewed the triage vital signs and the nursing notes. ?             ?               ? ?Differential diagnosis includes, but is not limited to, pulmonary edema, acute heart failure, pulmonary embolism, NSTEMI, hypothyroidism ? ?**The patient is on the cardiac monitor to evaluate for evidence of arrhythmia and/or significant heart rate changes.**} ? ?Patient presents with shortness of breath without chest pain.  Exam and chest x-Casique consistent with pulmonary edema.  IV Lasix ordered.  The rest of the lab work-up was unremarkable.  CT chest negative for PE, does show an area of possible evolving pneumonia.  I do not think the patient is septic, will cover with ceftriaxone and azithromycin while diuresing to ensure improvement. ?  ? ? ?FINAL CLINICAL IMPRESSION(S) / ED DIAGNOSES  ? ?Final diagnoses:  ?Acute respiratory failure with hypoxia ([place])  ? ? ? ?Rx / DC Orders  ? ?ED Discharge Orders   ? ? None  ? ?  ? ? ? ?Note:  This document was prepared using Dragon voice recognition software and may include unintentional dictation errors. ?  ?Carrie Mew, MD ?01/04/22 2336 ? ?

## 2022-01-04 NOTE — ED Triage Notes (Addendum)
Patient to ER via POV, reports she has been short of breath for several weeks with symptoms worsening today. Denies CP or productive cough. Bilateral leg swelling and abdominal distention. Biliary drain in place. ? ?Patient was supposed to be pre-admitted for a cholecystectomy however was advised to come to the ER due to [facility].  ? ?Room air sats 84% in triage. Placed on 2L vic [place] with improvement of sats to 95%. ?

## 2022-01-04 NOTE — Assessment & Plan Note (Addendum)
Patient has had weight gain of 12 to 15 pounds with dyspnea on exertion and lower extremity edema, elevated BNP. ?Recent echocardiogram in January showed normal systolic function.  Right ventricular function was normal.  No significant valvular abnormalities were noted.   ?Patient placed on IV diuretics.  Diuresed well.  Negative by 10 L.  Her weight has decreased as well. ?Furosemide changed to oral.  Continue beta-blocker or calcium channel blocker. ?

## 2022-01-04 NOTE — Assessment & Plan Note (Signed)
Sliding-scale insulin 

## 2022-01-04 NOTE — Assessment & Plan Note (Addendum)
Cholecystostomy drain tube was placed when she was originally diagnosed with cholecystitis January. ?Plan is for surgical intervention on 3/22.  ?Discussed with general surgery and cardiology.  Okay to proceed with surgery since patient appears to be improving.  ?Eliquis held. ?Notified by [facility] that there was some issue with insurance approving her surgery during her current hospital stay.  In view of this patient will be discharged today and she will be asked to come back for surgery tomorrow. ?

## 2022-01-04 NOTE — Assessment & Plan Note (Addendum)
Recently diagnosed in January in the setting of septic shock.  Remains in atrial fibrillation.  Anticoagulated with Eliquis.   ?Eliquis has been held and preparations for cholecystectomy on March 22.   ?Cardizem dose was increased.  Continue with carvedilol.  Heart rate is better controlled. ?

## 2022-01-04 NOTE — Assessment & Plan Note (Deleted)
Differential diagnosis was broad at the time of admission including CHF and pneumonia.  PE was ruled out on CT angiogram.  Presentation seems to be primarily due to CHF considering her lower extremity edema.  Bated BNP.  See under CHF. ?

## 2022-01-04 NOTE — Assessment & Plan Note (Addendum)
Secondary to acute heart failure. See management above ?

## 2022-01-04 NOTE — H&P (Signed)
?History and Physical  ? ? ?Patient: Amanda [name] [id number] DOB: 11-15-1955 ?DOA: 01/04/2022 ?DOS: the patient was seen and examined on 01/04/2022 ?PCP: Loura Pardon, MD  ?Patient coming from: Home ? ?Chief Complaint:  ?Chief Complaint  ?Patient presents with  ? Shortness of Breath  ? ? ?HPI: Amanda [name] is a 66 y.o. female with medical history significant for DM, HTN, HLD, atrial fibrillation diagnosed during hospitalization from 1/13 to 11/08/2021 for septic shock secondary to acute cholecystitis, dischargde with a cholecystostomy tube, currently on Eliquis until 3/19 in preparation for cholecystectomy by Dr. Aleen Campi on 3/22 who presents to the ED with shortness of breath.  She was sent into the ED due to concerns for shortness of breath and lower extremity edema  present during her preop anesthesia evaluation after she was noted to be significantly short of breath while speaking.  Patient reports a 1 month history of having progressive lower extremity edema for which she has been using compression, 12 to 15 pound weight gain, dyspnea on exertion.  Her echocardiogram in January 2023 during her hospitalization that showed normal EF and no diastolic dysfunction.  She received cardiac clearance for her upcoming surgery on 12/24/2021.  Patient denies chest pain, cough, fever or chills.  She denies one-sided leg pain ?ED course: On arrival, afebrile, EKG heart rate fluctuating, mostly from 98-115, tachypneic from 22-35 with elevated SBP to the 180s.  O2 sat initially 86% on room air requiring 2 to 4 L to maintain sats in the mid to high 90s. ?Blood work significant for troponin 9, BNP 537, potassium 3.3, glucose 228, hemoglobin 10.2 which is her baseline. ?EKG, personally viewed and interpreted: A-fib at 96 with no acute ST-T wave changes ?CTA chest negative for PE but with the following findings: ?1. No pulmonary embolus. ?2. Interval development of a moderate right and interval increase in ?size of a trace to small  volume left pleural effusions. ?3. Trace pericardial effusion. ?4. Right upper lobe peribronchovascular consolidation. Finding could ?represent a combination of atelectasis versus ?infection/inflammation. Limited evaluation due to timing of ?contrast. ? ?Patient started on ceftriaxone and azithromycin to cover for possible pneumonia.  Given a dose of IV Lasix.  Hospitalist consulted for admission.  ? ?Review of Systems: As mentioned in the history of present illness. All other systems reviewed and are negative. ?Past Medical History:  ?Diagnosis Date  ? Anxiety   ? Arthritis   ? Atrial fibrillation (HCC)   ? Cholecystitis   ? Diabetes mellitus without complication (HCC)   ? Dyspnea   ? GERD (gastroesophageal reflux disease)   ? Hyperlipidemia   ? Hypertension   ? ?Past Surgical History:  ?Procedure Laterality Date  ? COLONOSCOPY  2012  ? IR CHOLANGIOGRAM EXISTING TUBE  12/11/2021  ? IR PERC CHOLECYSTOSTOMY  11/02/2021  ? ?Social History:  reports that she quit smoking about 46 years ago. Her smoking use included cigarettes. She has never used smokeless tobacco. She reports that she does not drink alcohol and does not use drugs. ? ?Allergies  ?Allergen Reactions  ? Tramadol Other (See Comments)  ?  "woozy"  ? Penicillins Other (See Comments)  ?  Dizziness ?Tolerated augmentin but does not like taste  ? ? ?Family History  ?Problem Relation Age of Onset  ? COPD Mother   ? Hypertension Mother   ? Diabetes Mother   ? Severe combined immunodeficiency Father   ? Diabetes Sister   ? Heart disease Sister   ? Heart disease  Sister   ? Cancer Niece   ? ? ?Prior to Admission medications   ?Medication Sig Start Date End Date Taking? Authorizing Provider  ?albuterol (VENTOLIN HFA) 108 (90 Base) MCG/ACT inhaler Inhale 2 puffs into the lungs every 6 (six) hours as needed for wheezing or shortness of breath. 12/27/21   Vigg, [name], MD  ?apixaban (ELIQUIS) 5 MG TABS tablet Take 1 tablet (5 mg total) by mouth 2 (two) times daily. 12/06/21    Olevia Perches P, DO  ?benzonatate (TESSALON) 100 MG capsule Take 1 capsule (100 mg total) by mouth 2 (two) times daily as needed for cough. 12/27/21   Vigg, [name], MD  ?Calcium Carb-Cholecalciferol (CALCIUM 600+D3) 600-200 MG-UNIT TABS Take 1 tablet by mouth daily.    [provider]  ?carvedilol (COREG) 12.5 MG tablet Take 1 tablet (12.5 mg total) by mouth 2 (two) times daily with a meal. 12/06/21   Johnson, Megan P, DO  ?diltiazem (CARDIZEM CD) 180 MG 24 hr capsule Take 1 capsule (180 mg total) by mouth daily. 12/06/21   Dorcas Carrow, DO  ?Dulaglutide (TRULICITY) 3 MG/0.5ML SOPN INJECT 1 SYRINGE SUBCUTANEOUSLY ONCE A WEEK ?Strength: 3 MG/0.5ML ?Patient not taking: Reported on 01/01/2022 11/23/21   Loura Pardon, MD  ?Dulaglutide (TRULICITY) 3 MG/0.5ML SOPN Inject 3 mg as directed once a week. 12/24/21 03/24/22  Loura Pardon, MD  ?fexofenadine (ALLEGRA ALLERGY) 180 MG tablet Take 1 tablet (180 mg total) by mouth daily. 12/27/21   Vigg, [name], MD  ?montelukast (SINGULAIR) 10 MG tablet Take 1 tablet (10 mg total) by mouth at bedtime. 12/27/21   Vigg, [name], MD  ?Multiple Vitamin (MULTIVITAMIN) tablet Take 1 tablet by mouth daily.    [provider]  ?omeprazole (PRILOSEC) 20 MG capsule Take 20 mg by mouth daily.    [provider]  ?traZODone (DESYREL) 50 MG tablet Take 1 tablet (50 mg total) by mouth at bedtime. 11/08/21   Alford Highland, MD  ?vitamin B-12 (CYANOCOBALAMIN) 500 MCG tablet Take 500 mcg by mouth daily.    [provider]  ? ? ?Physical Exam: ?Vitals:  ? 01/04/22 1940 01/04/22 1945 01/04/22 2000 01/04/22 2020  ?BP:   (!) 185/113   ?Pulse: (!) 114  (!) 49 (!) 105  ?Resp: (!) 37 (!) 35 (!) 35 (!) 29  ?Temp:      ?TempSrc:      ?SpO2: 95% 96% 96% 96%  ?Weight:      ?Height:      ? ?Physical Exam ?Vitals and nursing note reviewed.  ?Constitutional:   ?   General: She is not in acute distress. ?   Appearance: Normal appearance.  ?HENT:  ?   Head: Normocephalic and atraumatic.   ?Cardiovascular:  ?   Rate and Rhythm: Regular rhythm. Tachycardia present.  ?   Pulses: Normal pulses.  ?   Heart sounds: Normal heart sounds. No murmur heard. ?Pulmonary:  ?   Effort: Pulmonary effort is normal. Tachypnea present.  ?   Breath sounds: Wheezing present. No rhonchi.  ?Abdominal:  ?   General: Bowel sounds are normal.  ?   Palpations: Abdomen is soft.  ?   Tenderness: There is no abdominal tenderness.  ?Musculoskeletal:     ?   General: No swelling or tenderness. Normal range of motion.  ?   Cervical back: Normal range of motion and neck supple.  ?   Right lower leg: No tenderness. Edema present.  ?   Left lower leg: No tenderness.  Edema present.  ?Skin: ?   General: Skin is warm and dry.  ?Neurological:  ?   General: No focal deficit present.  ?   Mental Status: She is alert. Mental status is at baseline.  ?Psychiatric:     ?   Mood and Affect: Mood normal.     ?   Behavior: Behavior normal.  ? ? ? ?Data Reviewed: ?Relevant notes from primary care and specialist visits, past discharge summaries as available in EHR, including Care Everywhere. ?Prior diagnostic testing as pertinent to current admission diagnoses ?Updated medications and problem lists for reconciliation ?ED course, including vitals, labs, imaging, treatment and response to treatment ?Triage notes, nursing and pharmacy notes and ED provider's notes ?Notable results as noted in HPI ? ? ?Assessment and Plan: ?* Acute dyspnea ?Multiple possibilities but suspecting mostly secondary to lateral pleural effusions and acute CHF.  PE ruled out on CTA chest.  Some possibility of pneumonia. ?We will treat all potential etiologies as outlined below ? ? ?Acute respiratory failure with hypoxia (HCC) ?Secondary to acute heart failure bilateral pleural effusions, possible pneumonia ?Patient is tachypneic, hypoxic to 86 on room air requiring O2 ?Continue O2 and wean as tolerated ? ?Acute heart failure (HCC) ?Weight gain of 12 to 15 pounds with dyspnea  on exertion and lower extremity edema, elevated BNP  ?IV Lasix and repeat echocardiogram ?Daily weights with intake and output monitoring ?Cardiology consult ? ?CAP (community acquired pneumonia) ?Continue

## 2022-01-04 NOTE — Assessment & Plan Note (Addendum)
Community-acquired pneumonia has been ruled out.  Presentation most likely due to CHF rather than an infectious process.  Procalcitonin was less than 0.1.  WBC is normal.  We will not continue with antibiotics at this time. ?

## 2022-01-04 NOTE — ED Notes (Addendum)
FIRST NURSE NOTE: Pt from pre-admit, pt is suppose to have a lap chole next weeks. Pt c/o SOB and bilateral leg swelling. States she has gained 15 lbs in Feb. Pt is A&Ox4. NP already ordered blood work and CXR ?

## 2022-01-04 NOTE — Assessment & Plan Note (Addendum)
Seen on CTA chest.  Depending on risk status that she may need CT scan in 12 months.  Will defer to outpatient providers ?

## 2022-01-05 ENCOUNTER — Inpatient Hospital Stay
Admit: 2022-01-05 | Discharge: 2022-01-05 | Disposition: A | Discharge status: Home / Self Care | Payer: Medicare HMO | Attending: Internal Medicine | Admitting: Internal Medicine

## 2022-01-05 ENCOUNTER — Encounter: Payer: Self-pay | Admitting: Internal Medicine

## 2022-01-05 DIAGNOSIS — Z434 Encounter for attention to other artificial openings of digestive tract: Secondary | ICD-10-CM | POA: Diagnosis not present

## 2022-01-05 DIAGNOSIS — I480 Paroxysmal atrial fibrillation: Secondary | ICD-10-CM | POA: Diagnosis not present

## 2022-01-05 DIAGNOSIS — I5031 Acute diastolic (congestive) heart failure: Secondary | ICD-10-CM | POA: Diagnosis not present

## 2022-01-05 DIAGNOSIS — E876 Hypokalemia: Secondary | ICD-10-CM | POA: Diagnosis not present

## 2022-01-05 DIAGNOSIS — D649 Anemia, unspecified: Secondary | ICD-10-CM | POA: Diagnosis present

## 2022-01-05 DIAGNOSIS — I161 Hypertensive emergency: Secondary | ICD-10-CM | POA: Diagnosis not present

## 2022-01-05 DIAGNOSIS — K8010 Calculus of gallbladder with chronic cholecystitis without obstruction: Secondary | ICD-10-CM

## 2022-01-05 DIAGNOSIS — J9601 Acute respiratory failure with hypoxia: Secondary | ICD-10-CM

## 2022-01-05 DIAGNOSIS — E1165 Type 2 diabetes mellitus with hyperglycemia: Secondary | ICD-10-CM

## 2022-01-05 LAB — BASIC METABOLIC PANEL
Anion gap: 8 (ref 5–15)
BUN: 16 mg/dL (ref 8–23)
CO2: 28 mmol/L (ref 22–32)
Calcium: 8.6 mg/dL — ABNORMAL LOW (ref 8.9–10.3)
Chloride: 103 mmol/L (ref 98–111)
Creatinine, Ser: 1.03 mg/dL — ABNORMAL HIGH (ref 0.44–1.00)
GFR, Estimated: 60 mL/min — ABNORMAL LOW (ref >60)
Glucose, Bld: 143 mg/dL — ABNORMAL HIGH (ref 70–99)
Potassium: 3.4 mmol/L — ABNORMAL LOW (ref 3.5–5.1)
Sodium: 139 mmol/L (ref 135–145)

## 2022-01-05 LAB — CBC
HCT: 31.7 % — ABNORMAL LOW (ref 36.0–46.0)
Hemoglobin: 9.5 g/dL — ABNORMAL LOW (ref 12.0–15.0)
MCH: 26.2 pg (ref 26.0–34.0)
MCHC: 30 g/dL (ref 30.0–36.0)
MCV: 87.6 fL (ref 80.0–100.0)
Platelets: 243 10*3/uL (ref 150–400)
RBC: 3.62 MIL/uL — ABNORMAL LOW (ref 3.87–5.11)
RDW: 15.2 % (ref 11.5–15.5)
WBC: 6.8 10*3/uL (ref 4.0–10.5)
nRBC: 0 % (ref 0.0–0.2)

## 2022-01-05 LAB — CBG MONITORING, ED
Glucose-Capillary: 139 mg/dL — ABNORMAL HIGH (ref 70–99)
Glucose-Capillary: 142 mg/dL — ABNORMAL HIGH (ref 70–99)
Glucose-Capillary: 126 mg/dL — ABNORMAL HIGH (ref 70–99)

## 2022-01-05 LAB — GLUCOSE, CAPILLARY
Glucose-Capillary: 112 mg/dL — ABNORMAL HIGH (ref 70–99)
Glucose-Capillary: 103 mg/dL — ABNORMAL HIGH (ref 70–99)

## 2022-01-05 LAB — PROCALCITONIN: Procalcitonin: <0.1 ng/mL

## 2022-01-05 MED ORDER — SODIUM CHLORIDE 0.9% FLUSH
5.0000 mL | Freq: Every day | INTRAVENOUS | Status: DC
  Administered 2022-01-05 – 2022-01-08 (×4): 5 mL

## 2022-01-05 MED ORDER — POTASSIUM CHLORIDE CRYS ER 20 MEQ PO TBCR
40.0000 meq | EXTENDED_RELEASE_TABLET | Freq: Once | ORAL | Status: AC
  Administered 2022-01-05: 40 meq via ORAL
  Filled 2022-01-05: qty 2

## 2022-01-05 MED ORDER — CHLORHEXIDINE GLUCONATE CLOTH 2 % EX PADS
6.0000 | MEDICATED_PAD | Freq: Every day | CUTANEOUS | Status: DC
  Administered 2022-01-05 – 2022-01-08 (×4): 6 via TOPICAL

## 2022-01-05 NOTE — Progress Notes (Signed)
*  PRELIMINARY RESULTS* ?Echocardiogram ?2D Echocardiogram has been performed. ? ?Lenor Coffin ?01/05/2022, 11:49 AM ?

## 2022-01-05 NOTE — Assessment & Plan Note (Addendum)
Hemoglobin low but stable compared to previous values.  No evidence of overt bleeding.  Continue to monitor. ?Anemia panel does suggest a degree of iron deficiency with a ferritin of 29, iron of 30, TIBC 350 and percent saturation of 9.  Folic acid 38.  Vitamin B12 level is pending. ?She will be ordered iron supplements at discharge.  And will need further work-up for her anemia in the outpatient setting. ?

## 2022-01-05 NOTE — Assessment & Plan Note (Addendum)
Hypomagnesemia ? ?Potassium is better.  Will be given prescription for potassium at discharge. ?

## 2022-01-05 NOTE — Consult Note (Signed)
Date of Consultation:  01/05/2022  Requesting Physician:  Lindajo Royal, MD  Reason for Consultation: Cholecystostomy tube management  History of Present Illness: Amanda [name] is a 66 y.o. female with a history of acute cholecystitis admitted on 11/02/2021 with sepsis, atrial fibrillation, hypertension.  She had a percutaneous cholecystostomy drain placed on 11/02/2021.  As an outpatient, she is currently on Eliquis and Coreg and diltiazem.  She had a phone call from the preoperative team yesterday and the nurse spoke with her reported that she sounded very winded and with mild respiratory distress.  We had her come to the hospital for x-[facility] and labs and from there she was advised to go to the emergency room for further evaluation.  She had a CT scan of her chest which did not show any pulmonary embolus but has shown a moderate right and trace left pleural effusions with trace pericardial effusion and a right upper lobe peribronchovascular consolidation.  She was admitted to the hospitalist team for further evaluation and management.    The patient reports that she has been feeling more short of breath recently with a lot of swelling in her legs and her abdomen that has been pushing up on her lungs and she feels that she was having a harder time breathing because of that.  Looks like she is also had a cough and has seen [name] on 12/27/2021.  Denies any fevers, chills, chest pain, nausea, vomiting.  Denies any issues with the percutaneous cholecystostomy drain and has been flushing that daily without issues.  This morning, the patient reports that she feels significantly better compared to yesterday even feels that the swelling has gone down.  Of note, the patient scheduled for robotic assisted cholecystectomy with me on 01/09/2022.  Her last dose of Eliquis would be tomorrow in preparation for surgery.  Past Medical History: Past Medical History:  Diagnosis Date   Anxiety    Arthritis    Atrial  fibrillation (HCC)    Cholecystitis    Diabetes mellitus without complication (HCC)    Dyspnea    GERD (gastroesophageal reflux disease)    Hyperlipidemia    Hypertension      Past Surgical History: Past Surgical History:  Procedure Laterality Date   COLONOSCOPY  2012   IR CHOLANGIOGRAM EXISTING TUBE  12/11/2021   IR PERC CHOLECYSTOSTOMY  11/02/2021    Home Medications: Prior to Admission medications   Medication Sig Start Date End Date Taking? Authorizing Provider  apixaban (ELIQUIS) 5 MG TABS tablet Take 1 tablet (5 mg total) by mouth 2 (two) times daily. 12/06/21  Yes Johnson, Megan P, DO  benzonatate (TESSALON) 100 MG capsule Take 1 capsule (100 mg total) by mouth 2 (two) times daily as needed for cough. 12/27/21  Yes Vigg, [name], MD  Calcium Carb-Cholecalciferol (CALCIUM 600+D3) 600-200 MG-UNIT TABS Take 1 tablet by mouth daily.   Yes [provider]  carvedilol (COREG) 12.5 MG tablet Take 1 tablet (12.5 mg total) by mouth 2 (two) times daily with a meal. 12/06/21  Yes Johnson, Megan P, DO  diltiazem (CARDIZEM CD) 180 MG 24 hr capsule Take 1 capsule (180 mg total) by mouth daily. 12/06/21  Yes Johnson, Megan P, DO  Dulaglutide (TRULICITY) 3 MG/0.5ML SOPN Inject 3 mg as directed once a week. 12/24/21 03/24/22 Yes Vigg, [name], MD  fexofenadine (ALLEGRA ALLERGY) 180 MG tablet Take 1 tablet (180 mg total) by mouth daily. 12/27/21  Yes Vigg, [name], MD  montelukast (SINGULAIR) 10 MG tablet  Take 1 tablet (10 mg total) by mouth at bedtime. 12/27/21  Yes Vigg, [name], MD  Multiple Vitamin (MULTIVITAMIN) tablet Take 1 tablet by mouth daily.   Yes [provider]  omeprazole (PRILOSEC) 20 MG capsule Take 20 mg by mouth daily.   Yes [provider]  traZODone (DESYREL) 50 MG tablet Take 1 tablet (50 mg total) by mouth at bedtime. 11/08/21  Yes Wieting, Richard, MD  vitamin B-12 (CYANOCOBALAMIN) 500 MCG tablet Take 500 mcg by mouth daily.   Yes [provider]   albuterol (VENTOLIN HFA) 108 (90 Base) MCG/ACT inhaler Inhale 2 puffs into the lungs every 6 (six) hours as needed for wheezing or shortness of breath. 12/27/21   Vigg, [name], MD  Dulaglutide (TRULICITY) 3 MG/0.5ML SOPN INJECT 1 SYRINGE SUBCUTANEOUSLY ONCE A WEEK Strength: 3 MG/0.5ML Patient not taking: Reported on 01/01/2022 11/23/21   Loura Pardon, MD    Allergies: Allergies  Allergen Reactions   Tramadol Other (See Comments)    "woozy"   Penicillins Other (See Comments)    Dizziness Tolerated augmentin but does not like taste    Social History:  reports that she quit smoking about 46 years ago. Her smoking use included cigarettes. She has never used smokeless tobacco. She reports that she does not drink alcohol and does not use drugs.   Family History: Family History  Problem Relation Age of Onset   COPD Mother    Hypertension Mother    Diabetes Mother    Severe combined immunodeficiency Father    Diabetes Sister    Heart disease Sister    Heart disease Sister    Cancer Niece     Review of Systems: Review of Systems  Constitutional:  Negative for chills and fever.  HENT:  Negative for hearing loss.   Respiratory:  Positive for cough and shortness of breath.   Cardiovascular:  Positive for leg swelling. Negative for chest pain.  Gastrointestinal:  Negative for abdominal pain, diarrhea, nausea and vomiting.  Genitourinary:  Negative for dysuria.  Musculoskeletal:  Negative for myalgias.  Skin:  Negative for rash.  Neurological:  Negative for dizziness.  Psychiatric/Behavioral:  Negative for depression.    Physical Exam BP 137/90 (BP Location: Left Arm)   Pulse 98   Temp 98.3 F (36.8 C) (Oral)   Resp (!) 22   Ht 5\' 1"  (1.549 m)   Wt 87.1 kg   SpO2 100%   BMI 36.28 kg/m  CONSTITUTIONAL: No acute distress. HEENT:  Normocephalic, atraumatic, extraocular motion intact. NECK: Trachea is midline, and there is no jugular venous distension. RESPIRATORY:  Normal  respiratory effort without pathologic use of accessory muscles. CARDIOVASCULAR: Irregular rhythm, regular rate. GI: The abdomen is soft, nondistended, nontender to palpation.  There is some mild edema of the skin particularly in the lower portion of the abdomen which is now better than it was when I saw her last in the office no evidence of abscess, erythema, or other induration.  Right upper quadrant gallbladder drain in place with some mild bilious fluid in the bag.  No tenderness.  MUSCULOSKELETAL:  Normal muscle strength and tone in all four extremities.  No peripheral edema or cyanosis. SKIN: Skin turgor is normal. There are no pathologic skin lesions.  NEUROLOGIC:  Motor and sensation is grossly normal.  Cranial nerves are grossly intact. PSYCH:  Alert and oriented to person, place and time. Affect is normal.  Laboratory Analysis: Results for orders placed or performed during the hospital encounter of  01/04/22 (from the past 24 hour(s))  Basic metabolic panel     Status: Abnormal   Collection Time: 01/04/22  4:29 PM  Result Value Ref Range   Sodium 139 135 - 145 mmol/L   Potassium 3.3 (L) 3.5 - 5.1 mmol/L   Chloride 104 98 - 111 mmol/L   CO2 25 22 - 32 mmol/L   Glucose, Bld 228 (H) 70 - 99 mg/dL   BUN 19 8 - 23 mg/dL   Creatinine, Ser 4.09 0.44 - 1.00 mg/dL   Calcium 8.8 (L) 8.9 - 10.3 mg/dL   GFR, Estimated >81 >19 mL/min   Anion gap 10 5 - 15  Comprehensive metabolic panel     Status: Abnormal   Collection Time: 01/04/22  6:31 PM  Result Value Ref Range   Sodium 139 135 - 145 mmol/L   Potassium 3.6 3.5 - 5.1 mmol/L   Chloride 105 98 - 111 mmol/L   CO2 21 (L) 22 - 32 mmol/L   Glucose, Bld 201 (H) 70 - 99 mg/dL   BUN 18 8 - 23 mg/dL   Creatinine, Ser 1.47 0.44 - 1.00 mg/dL   Calcium 8.9 8.9 - 82.9 mg/dL   Total Protein 7.8 6.5 - 8.1 g/dL   Albumin 3.3 (L) 3.5 - 5.0 g/dL   AST 22 15 - 41 U/L   ALT 12 0 - 44 U/L   Alkaline Phosphatase 83 38 - 126 U/L   Total Bilirubin 0.7  0.3 - 1.2 mg/dL   GFR, Estimated >56 >21 mL/min   Anion gap 13 5 - 15  Troponin I (High Sensitivity)     Status: None   Collection Time: 01/04/22  6:31 PM  Result Value Ref Range   Troponin I (High Sensitivity) 9 <18 ng/L  TSH     Status: None   Collection Time: 01/04/22  6:31 PM  Result Value Ref Range   TSH 3.901 0.350 - 4.500 uIU/mL  Resp Panel by RT-PCR (Flu A&B, Covid) Nasopharyngeal Swab     Status: None   Collection Time: 01/04/22  8:05 PM   Specimen: Nasopharyngeal Swab; Nasopharyngeal(NP) swabs in vial transport medium  Result Value Ref Range   SARS Coronavirus 2 by RT PCR NEGATIVE NEGATIVE   Influenza A by PCR NEGATIVE NEGATIVE   Influenza B by PCR NEGATIVE NEGATIVE  CBG monitoring, ED     Status: Abnormal   Collection Time: 01/04/22  9:12 PM  Result Value Ref Range   Glucose-Capillary 215 (H) 70 - 99 mg/dL  CBG monitoring, ED     Status: Abnormal   Collection Time: 01/04/22 10:34 PM  Result Value Ref Range   Glucose-Capillary 208 (H) 70 - 99 mg/dL  Procalcitonin - Baseline     Status: None   Collection Time: 01/05/22  5:25 AM  Result Value Ref Range   Procalcitonin <0.10 ng/mL  Basic metabolic panel     Status: Abnormal   Collection Time: 01/05/22  5:25 AM  Result Value Ref Range   Sodium 139 135 - 145 mmol/L   Potassium 3.4 (L) 3.5 - 5.1 mmol/L   Chloride 103 98 - 111 mmol/L   CO2 28 22 - 32 mmol/L   Glucose, Bld 143 (H) 70 - 99 mg/dL   BUN 16 8 - 23 mg/dL   Creatinine, Ser 3.08 (H) 0.44 - 1.00 mg/dL   Calcium 8.6 (L) 8.9 - 10.3 mg/dL   GFR, Estimated 60 (L) >60 mL/min   Anion gap 8 5 - 15  CBC     Status: Abnormal   Collection Time: 01/05/22  5:25 AM  Result Value Ref Range   WBC 6.8 4.0 - 10.5 K/uL   RBC 3.62 (L) 3.87 - 5.11 MIL/uL   Hemoglobin 9.5 (L) 12.0 - 15.0 g/dL   HCT 16.1 (L) 09.6 - 04.5 %   MCV 87.6 80.0 - 100.0 fL   MCH 26.2 26.0 - 34.0 pg   MCHC 30.0 30.0 - 36.0 g/dL   RDW 40.9 81.1 - 91.4 %   Platelets 243 150 - 400 K/uL   nRBC 0.0 0.0  - 0.2 %  CBG monitoring, ED     Status: Abnormal   Collection Time: 01/05/22  8:29 AM  Result Value Ref Range   Glucose-Capillary 139 (H) 70 - 99 mg/dL  CBG monitoring, ED     Status: Abnormal   Collection Time: 01/05/22 11:58 AM  Result Value Ref Range   Glucose-Capillary 142 (H) 70 - 99 mg/dL    Imaging: DG Chest 2 View  Result Date: 01/04/2022 CLINICAL DATA:  Shortness of breath, cough EXAM: CHEST - 2 VIEW COMPARISON:  Radiograph 11/02/2021 FINDINGS: Enlarged cardiac silhouette. There are diffuse interstitial and lower lung predominant airspace opacities. Probable trace effusions. Low lung volumes. No visible pneumothorax. No acute osseous abnormality. Thoracic spondylosis. IMPRESSION: Cardiomegaly with mild pulmonary edema. Probable trace pleural effusions. Electronically Signed   By: Caprice Renshaw M.D.   On: 01/04/2022 16:56   CT Angio Chest PE W and/or Wo Contrast  Result Date: 01/04/2022 CLINICAL DATA:  Pulmonary embolism (PE) suspected, high prob. , reports she has been short of breath for several weeks with symptoms worsening today. Denies CP or productive cough. Bilateral leg swelling and abdominal distention EXAM: CT ANGIOGRAPHY CHEST WITH CONTRAST TECHNIQUE: Multidetector CT imaging of the chest was performed using the standard protocol during bolus administration of intravenous contrast. Multiplanar CT image reconstructions and MIPs were obtained to evaluate the vascular anatomy. RADIATION DOSE REDUCTION: This exam was performed according to the departmental dose-optimization program which includes automated exposure control, adjustment of the mA and/or kV according to patient size and/or use of iterative reconstruction technique. CONTRAST:  75mL OMNIPAQUE IOHEXOL 350 MG/ML SOLN COMPARISON:  CT abdomen pelvis 11/02/2021 FINDINGS: Cardiovascular: Satisfactory opacification of the pulmonary arteries to the segmental level. No evidence of pulmonary embolism. Normal heart size. Trace  pericardial effusion. The thoracic aorta is normal in caliber. Mild atherosclerotic plaque of the thoracic aorta. Four-vessel coronary artery calcifications. Mediastinum/Nodes: No enlarged mediastinal, hilar, or axillary lymph nodes. Thyroid gland, trachea, and esophagus demonstrate no significant findings. Lungs/Pleura: Right upper lobe peribronchovascular consolidation (5:170). Passive atelectasis of bilateral lower lobes. Persistent (from 11/02/2021) punctate left lower and right middle lobe calcified pulmonary nodule.(5:46, 203). No pulmonary mass. Interval development of a moderate right and interval increase in size of a trace to small volume left pleural effusions. No pneumothorax. Upper Abdomen: Partially visualized at least trace to small volume upper abdominal ascites. Musculoskeletal: No abdominal wall hernia or abnormality. No suspicious lytic or blastic osseous lesions. No acute displaced fracture. Multilevel degenerative changes of the spine. Review of the MIP images confirms the above findings. IMPRESSION: 1. No pulmonary embolus. 2. Interval development of a moderate right and interval increase in size of a trace to small volume left pleural effusions. 3. Trace pericardial effusion. 4. Right upper lobe peribronchovascular consolidation. Finding could represent a combination of atelectasis versus infection/inflammation. Limited evaluation due to timing of contrast. 5. Persistent punctate left lower and right  middle lobe calcified pulmonary nodules. No follow-up needed if patient is low-risk (and has no known or suspected primary neoplasm). Non-contrast chest CT can be considered in 12 months if patient is high-risk. This recommendation follows the consensus statement: Guidelines for Management of Incidental Pulmonary Nodules Detected on CT Images: From the Fleischner Society 2017; Radiology 2017; 284:228-243. 6. Partially visualized at least trace to small volume upper abdominal ascites. 7. Aortic  Atherosclerosis (ICD10-I70.0) -including four-vessel. Electronically Signed   By: Tish Frederickson M.D.   On: 01/04/2022 19:41   US Venous Img Lower Bilateral  Result Date: 01/04/2022 CLINICAL DATA:  Bilateral lower extremity swelling. EXAM: BILATERAL LOWER EXTREMITY VENOUS DOPPLER ULTRASOUND TECHNIQUE: Gray-scale sonography with graded compression, as well as color Doppler and duplex ultrasound were performed to evaluate the lower extremity deep venous systems from the level of the common femoral vein and including the common femoral, femoral, profunda femoral, popliteal and calf veins including the posterior tibial, peroneal and gastrocnemius veins when visible. The superficial great saphenous vein was also interrogated. Spectral Doppler was utilized to evaluate flow at rest and with distal augmentation maneuvers in the common femoral, femoral and popliteal veins. COMPARISON:  None. FINDINGS: RIGHT LOWER EXTREMITY Common Femoral Vein: No evidence of thrombus. Normal compressibility, respiratory phasicity and response to augmentation. Saphenofemoral Junction: No evidence of thrombus. Normal compressibility and flow on color Doppler imaging. Profunda Femoral Vein: No evidence of thrombus. Normal compressibility and flow on color Doppler imaging. Femoral Vein: No evidence of thrombus. Normal compressibility, respiratory phasicity and response to augmentation. Popliteal Vein: No evidence of thrombus. Normal compressibility, respiratory phasicity and response to augmentation. Calf Veins: No evidence of thrombus. Normal compressibility and flow on color Doppler imaging. Superficial Great Saphenous Vein: No evidence of thrombus. Normal compressibility. Venous Reflux:  None. Other Findings:  None. LEFT LOWER EXTREMITY Common Femoral Vein: No evidence of thrombus. Normal compressibility, respiratory phasicity and response to augmentation. Saphenofemoral Junction: No evidence of thrombus. Normal compressibility and flow  on color Doppler imaging. Profunda Femoral Vein: No evidence of thrombus. Normal compressibility and flow on color Doppler imaging. Femoral Vein: No evidence of thrombus. Normal compressibility, respiratory phasicity and response to augmentation. Popliteal Vein: No evidence of thrombus. Normal compressibility, respiratory phasicity and response to augmentation. Calf Veins: No evidence of thrombus. Normal compressibility and flow on color Doppler imaging. Superficial Great Saphenous Vein: No evidence of thrombus. Normal compressibility. Venous Reflux:  None. Other Findings:  None. IMPRESSION: No evidence of deep venous thrombosis in either lower extremity. Electronically Signed   By: Aram Candela M.D.   On: 01/04/2022 19:35    Assessment and Plan: This is a 66 y.o. female with a percutaneous cholecystostomy drain admitted with CHF exacerbation.  - From the surgical standpoint, have placed orders for gallbladder drain care with flushing of 5 mL normal saline through the catheter once daily.  She may have a diet as tolerated.  We will check with the patient's primary team and cardiology to see if she will still be active appropriate candidate for surgery this coming week on 3/22 or if we should postpone surgery given this admission and current findings.  Discussed with the patient the possibility of having to postpone her surgery and she understands although is not as happy about that but understands the reasoning behind postponing if needed.  I spent 45 minutes dedicated to the care of this patient on the date of this encounter to include pre-visit review of records, face-to-face time with the patient discussing diagnosis  and management, and any post-visit coordination of care.   Howie Ill, MD [facility] Pg:  [phone]

## 2022-01-05 NOTE — Consult Note (Signed)
? ?[facility] Cardiology Consultation Note  ?Patient ID: Amanda [name], MRN: [id number], DOB/AGE: 67/21/1957 67 y.o. ?Admit date: 01/04/2022   Date of Consult: 01/05/2022 ?Primary Physician: Loura Pardon, MD ?Primary Cardiologist: Gwen Pounds ? ?Chief Complaint:  ?Chief Complaint  ?Patient presents with  ? Shortness of Breath  ? ?Reason for Consult:  Atrial fibrillation and heart failure ? ?HPI: 66 y.o. female with known diabetes hypertension hyperlipidemia persistent atrial fibrillation who has done relatively well recently with a normal echocardiogram showing normal LV systolic function and no evidence of valvular heart disease.  In addition to that the patient has been on appropriate medication management for her atrial fibrillation.  Patient has had new onset of significant severe shortness of breath for which she has had lower extremity edema weakness fatigue and PND and orthopnea.  This is consistent with diastolic dysfunction congestive heart failure.  There is no current evidence of acute coronary syndrome and or myocardial infarction EKG shows atrial fibrillation with poor R wave progression unchanged from before. ? ?Past Medical History:  ?Diagnosis Date  ? Anxiety   ? Arthritis   ? Atrial fibrillation (HCC)   ? Cholecystitis   ? Diabetes mellitus without complication (HCC)   ? Dyspnea   ? GERD (gastroesophageal reflux disease)   ? Hyperlipidemia   ? Hypertension   ?   ? ?Surgical History:  ?Past Surgical History:  ?Procedure Laterality Date  ? COLONOSCOPY  2012  ? IR CHOLANGIOGRAM EXISTING TUBE  12/11/2021  ? IR PERC CHOLECYSTOSTOMY  11/02/2021  ?  ? ?Home Meds: ?Prior to Admission medications   ?Medication Sig Start Date End Date Taking? Authorizing Provider  ?apixaban (ELIQUIS) 5 MG TABS tablet Take 1 tablet (5 mg total) by mouth 2 (two) times daily. 12/06/21  Yes Johnson, Megan P, DO  ?benzonatate (TESSALON) 100 MG capsule Take 1 capsule (100 mg total) by mouth 2 (two) times daily as needed for cough. 12/27/21   Yes Vigg, [name], MD  ?Calcium Carb-Cholecalciferol (CALCIUM 600+D3) 600-200 MG-UNIT TABS Take 1 tablet by mouth daily.   Yes [provider]  ?carvedilol (COREG) 12.5 MG tablet Take 1 tablet (12.5 mg total) by mouth 2 (two) times daily with a meal. 12/06/21  Yes Johnson, Megan P, DO  ?diltiazem (CARDIZEM CD) 180 MG 24 hr capsule Take 1 capsule (180 mg total) by mouth daily. 12/06/21  Yes Johnson, Megan P, DO  ?Dulaglutide (TRULICITY) 3 MG/0.5ML SOPN Inject 3 mg as directed once a week. 12/24/21 03/24/22 Yes Vigg, [name], MD  ?fexofenadine (ALLEGRA ALLERGY) 180 MG tablet Take 1 tablet (180 mg total) by mouth daily. 12/27/21  Yes Vigg, [name], MD  ?montelukast (SINGULAIR) 10 MG tablet Take 1 tablet (10 mg total) by mouth at bedtime. 12/27/21  Yes Vigg, [name], MD  ?Multiple Vitamin (MULTIVITAMIN) tablet Take 1 tablet by mouth daily.   Yes [provider]  ?omeprazole (PRILOSEC) 20 MG capsule Take 20 mg by mouth daily.   Yes [provider]  ?traZODone (DESYREL) 50 MG tablet Take 1 tablet (50 mg total) by mouth at bedtime. 11/08/21  Yes Wieting, Richard, MD  ?vitamin B-12 (CYANOCOBALAMIN) 500 MCG tablet Take 500 mcg by mouth daily.   Yes [provider]  ?albuterol (VENTOLIN HFA) 108 (90 Base) MCG/ACT inhaler Inhale 2 puffs into the lungs every 6 (six) hours as needed for wheezing or shortness of breath. 12/27/21   Vigg, [name], MD  ?Dulaglutide (TRULICITY) 3 MG/0.5ML SOPN INJECT 1 SYRINGE SUBCUTANEOUSLY ONCE A WEEK ?Strength: 3 MG/0.5ML ?  Patient not taking: Reported on 01/01/2022 11/23/21   Loura Pardon, MD  ? ? ?Inpatient Medications:  ? apixaban  5 mg Oral BID  ? carvedilol  12.5 mg Oral BID WC  ? furosemide  40 mg Intravenous Q12H  ? insulin aspart  0-20 Units Subcutaneous TID WC  ? insulin aspart  0-5 Units Subcutaneous QHS  ? sodium chloride flush  5 mL Intracatheter Daily  ? ? ? ?Allergies:  ?Allergies  ?Allergen Reactions  ? Tramadol Other (See Comments)  ?  "woozy"  ? Penicillins Other  (See Comments)  ?  Dizziness ?Tolerated augmentin but does not like taste  ? ? ?Social History  ? ?Socioeconomic History  ? Marital status: Single  ?  Spouse name: [name]  ? Number of children: Not on file  ? Years of education: Not on file  ? Highest education level: Not on file  ?Occupational History  ? Not on file  ?Tobacco Use  ? Smoking status: Former  ?  Types: Cigarettes  ?  Quit date: 12/07/1975  ?  Years since quitting: 46.1  ? Smokeless tobacco: Never  ?Vaping Use  ? Vaping Use: Never used  ?Substance and Sexual Activity  ? Alcohol use: No  ? Drug use: No  ? Sexual activity: Not on file  ?Other Topics Concern  ? Not on file  ?Social History Narrative  ? Not on file  ? ?Social Determinants of Health  ? ?Financial Resource Strain: Low Risk   ? Difficulty of Paying Living Expenses: Not hard at all  ?Food Insecurity: No Food Insecurity  ? Worried About Programme researcher, broadcasting/film/video in the Last Year: Never true  ? Ran Out of Food in the Last Year: Never true  ?Transportation Needs: Not on file  ?Physical Activity: Insufficiently Active  ? Days of Exercise per Week: 3 days  ? Minutes of Exercise per Session: 20 min  ?Stress: No Stress Concern Present  ? Feeling of Stress : Not at all  ?Social Connections: Unknown  ? Frequency of Communication with Friends and Family: More than three times a week  ? Frequency of Social Gatherings with Friends and Family: Twice a week  ? Attends Religious Services: Never  ? Active Member of Clubs or Organizations: No  ? Attends Banker Meetings: Never  ? Marital Status: Not on file  ?Intimate Partner Violence: Not At Risk  ? Fear of Current or Ex-Partner: No  ? Emotionally Abused: No  ? Physically Abused: No  ? Sexually Abused: No  ?  ? ?Family History  ?Problem Relation Age of Onset  ? COPD Mother   ? Hypertension Mother   ? Diabetes Mother   ? Severe combined immunodeficiency Father   ? Diabetes Sister   ? Heart disease Sister   ? Heart disease Sister   ? Cancer Niece   ?   ? ?Review of Systems ?Positive for shortness of breath PND orthopnea ?Negative for: ?General:  chills, fever, night sweats or weight changes.  ?Cardiovascular: Positive for PND orthopnea negative for syncope dizziness  ?Dermatological skin lesions rashes ?Respiratory: Cough congestion ?Urologic: Frequent urination urination at night and hematuria ?Abdominal: negative for nausea, vomiting, diarrhea, bright red blood per rectum, melena, or hematemesis ?Neurologic: negative for visual changes, and/or hearing changes  ?All other systems reviewed and are otherwise negative except as noted above. ? ?Labs: ?No results for input(s): CKTOTAL, CKMB, TROPONINI in the last 72 hours. ?Lab Results  ?Component Value Date  ? WBC  6.8 01/05/2022  ? HGB 9.5 (L) 01/05/2022  ? HCT 31.7 (L) 01/05/2022  ? MCV 87.6 01/05/2022  ? PLT 243 01/05/2022  ?  ?Recent Labs  ?Lab 01/04/22 ?1831 01/05/22 ?7858  ?NA 139 139  ?K 3.6 3.4*  ?CL 105 103  ?CO2 21* 28  ?BUN 18 16  ?CREATININE 0.92 1.03*  ?CALCIUM 8.9 8.6*  ?PROT 7.8  --   ?BILITOT 0.7  --   ?ALKPHOS 83  --   ?ALT 12  --   ?AST 22  --   ?GLUCOSE 201* 143*  ? ?Lab Results  ?Component Value Date  ? CHOL 142 08/30/2021  ? HDL 50 08/30/2021  ? LDLCALC 64 08/30/2021  ? TRIG 163 (H) 08/30/2021  ? ?No results found for: DDIMER ? ?Radiology/Studies:  ?DG Chest 2 View ? ?Result Date: 01/04/2022 ?CLINICAL DATA:  Shortness of breath, cough EXAM: CHEST - 2 VIEW COMPARISON:  Radiograph 11/02/2021 FINDINGS: Enlarged cardiac silhouette. There are diffuse interstitial and lower lung predominant airspace opacities. Probable trace effusions. Low lung volumes. No visible pneumothorax. No acute osseous abnormality. Thoracic spondylosis. IMPRESSION: Cardiomegaly with mild pulmonary edema. Probable trace pleural effusions. Electronically Signed   By: Caprice Renshaw M.D.   On: 01/04/2022 16:56  ? ?CT Angio Chest PE W and/or Wo Contrast ? ?Result Date: 01/04/2022 ?CLINICAL DATA:  Pulmonary embolism (PE) suspected, high  prob. , reports she has been short of breath for several weeks with symptoms worsening today. Denies CP or productive cough. Bilateral leg swelling and abdominal distention EXAM: CT ANGIOGRAPHY CHEST WITH

## 2022-01-05 NOTE — ED Notes (Signed)
Pt urine bag noted to be full, emptied by this tech. Approx. output noted.  ?

## 2022-01-05 NOTE — Hospital Course (Addendum)
66 y.o. female with medical history significant for DM, HTN, HLD, atrial fibrillation diagnosed during hospitalization from 1/13 to 11/08/2021 for septic shock secondary to acute cholecystitis, discharged with a cholecystostomy tube, currently on Eliquis until 3/19 in preparation for cholecystectomy by Dr. Aleen Campi on 3/22 who presents to the ED with shortness of breath.  She also mention lower extremity edema.  Found to have concerns for pulmonary edema.  Thought to have acute diastolic CHF.  She was hospitalized for further management.  Pleural effusions were noted on CT scan.  Patient was seen by cardiology and by general surgery.  She is diuresing well.  Cardizem was added for better rate control of atrial fibrillation.   ?

## 2022-01-05 NOTE — Progress Notes (Addendum)
? ?[facility] ?PROGRESS NOTE ? ? ?Amanda [name] UKG:[id number] DOB: [birth year] DOA: 01/04/2022  1 ?DOS: the patient was seen and examined on 01/05/2022 ? ?PCP: Loura Pardon, MD ? ?Brief History and Hospital Course:  ?66 y.o. female with medical history significant for DM, HTN, HLD, atrial fibrillation diagnosed during hospitalization from 1/13 to 11/08/2021 for septic shock secondary to acute cholecystitis, discharged with a cholecystostomy tube, currently on Eliquis until 3/19 in preparation for cholecystectomy by Dr. Aleen Campi on 3/22 who presents to the ED with shortness of breath.  She also mention lower extremity edema.  Found to have concerns for pulmonary edema.  Thought to have acute diastolic CHF.  She was hospitalized for further management.  Pleural effusions were noted on CT scan.  ? ?Consultants: Cardiology.  General surgery ? ?Procedures: None ? ? ? ?Subjective: ?Patient mentions that her shortness of breath is slightly better this morning compared to yesterday.  Denies any chest pain.  No nausea vomiting.  Swelling in the legs has improved. ? ? ? ?Assessment/Plan: ? ? ?* Acute on chronic diastolic CHF (congestive heart failure) (HCC) ?Patient has had weight gain of 12 to 15 pounds with dyspnea on exertion and lower extremity edema, elevated BNP. ?Recent echocardiogram in January showed normal systolic function.  Right ventricular function was normal.  No significant valvular abnormalities were noted.  No clear indication to repeat echocardiogram.  Continue furosemide.  Strict ins and outs and daily weights.  Cardiology has been consulted. ? ?Acute respiratory failure with hypoxia (HCC) ?Secondary to CHF pleural effusion.  Initially was requiring 4 L of oxygen.  Now down to 2 L.  Continue to wean down as tolerated.   ? ?Acute dyspnea ?Differential diagnosis was broad at the time of admission including CHF and pneumonia.  PE was ruled out on CT angiogram.  Presentation seems to be primarily due to CHF  considering her lower extremity edema.  Bated BNP.  See under CHF. ? ?CAP (community acquired pneumonia) ?Presentation most likely due to CHF rather than an infectious process.  Procalcitonin was less than 0.1.  WBC is normal.  We will not continue with antibiotics at this time. ? ?Bilateral pleural effusion ?Secondary to acute heart failure. See management above ? ?Hypertensive emergency ?Presented with blood pressures in the 180s systolic.  Improved after she was given cardiac medications.  Continue to monitor.  She is noted to be on carvedilol and furosemide currently.  Hydralazine as needed.  Diltiazem is on hold. ? ?AF (paroxysmal atrial fibrillation) (HCC) ?Recently diagnosed in January in the setting of septic shock.  Remains in atrial fibrillation.  Heart rate seems to be well controlled.  Anticoagulated with Eliquis.   ?Eliquis was supposed to be held on 3/18 for cholecystectomy to be done on 3/22.  Her acute illness will likely postpone the surgery ? ?Cholecystostomy care [facility]) ?Outpatient cholecystectomy planned for 322 ?Dr. Aleen Campi has been consulted. ?Cholecystostomy drain tube noted ? ?Uncontrolled type 2 diabetes mellitus with hyperglycemia, without long-term current use of insulin (HCC) ?Home medication list showed that patient is supposed to be on Trulicity.  HbA1c 6.9 in January.  Continue SSI.  Monitor CBGs ? ?Chronic anticoagulation ?Continue Eliquis.  See note under atrial fibrillation for details ? ?Multiple pulmonary nodules determined by computed tomography of lung ?Seen on CTA chest.  Depending on risk status that she may need CT scan in 12 months.  Will defer to outpatient providers ? ?Normocytic anemia ?Hemoglobin low but stable compared to previous values.  No evidence of overt bleeding.  Continue to monitor. ? ?Hypokalemia ?Potassium to be repleted.  Likely secondary to diuretics. ? ?Diabetes mellitus without complication (HCC)-resolved as of 01/04/2022 ?Sliding scale  insulin ? ? ? ?Obesity ?Estimated body mass index is 36.28 kg/m? as calculated from the following: ?  Height as of this encounter: 5\' 1"  (1.549 m). ?  Weight as of this encounter: 87.1 kg. ? ? ?DVT Prophylaxis: On Eliquis ?Code Status: Full code ?Family Communication: Discussed with patient ?Disposition Plan: Hopefully return home in improved ? ?Status is: Inpatient ?Remains inpatient appropriate because: Acute CHF ? ? ? ? ?Medications: Scheduled: ? apixaban  5 mg Oral BID  ? carvedilol  12.5 mg Oral BID WC  ? furosemide  40 mg Intravenous Q12H  ? insulin aspart  0-20 Units Subcutaneous TID WC  ? insulin aspart  0-5 Units Subcutaneous QHS  ? sodium chloride flush  5 mL Intracatheter Daily  ? ?Continuous: ?WEX:[id number] **OR** acetaminophen, albuterol, hydrALAZINE, ondansetron **OR** ondansetron (ZOFRAN) IV ? ?Antibiotics: ?Anti-infectives (From admission, onward)  ? ? Start     Dose/Rate Route Frequency Ordered Stop  ? 01/04/22 2000  cefTRIAXone (ROCEPHIN) 2 g in sodium chloride 0.9 % 100 mL IVPB       ? 2 g ?200 mL/hr over 30 Minutes Intravenous  Once 01/04/22 1951 01/04/22 2007  ? 01/04/22 2000  azithromycin (ZITHROMAX) 500 mg in sodium chloride 0.9 % 250 mL IVPB       ? 500 mg ?250 mL/hr over 60 Minutes Intravenous  Once 01/04/22 1951 01/04/22 2116  ? ?  ? ? ?Objective: ? ?Vital Signs ? ?Vitals:  ? 01/05/22 0400 01/05/22 0500 01/05/22 0600 01/05/22 0700  ?BP: 139/79 (!) 143/84 124/69 (!) 146/75  ?Pulse: 87 78 85 81  ?Resp: 15 18 19 17   ?Temp:      ?TempSrc:      ?SpO2: 100% 100% 100% 100%  ?Weight:      ?Height:      ? ? ?Intake/Output Summary (Last 24 hours) at 01/05/2022 0946 ?Last data filed at 01/05/2022 [phone] ?Gross per 24 hour  ?Intake --  ?Output 2350 ml  ?Net -2350 ml  ? ?Filed Weights  ? 01/04/22 1706  ?Weight: 87.1 kg  ? ? ?General appearance: Awake alert.  In no distress ?Resp: Tachypneic at rest without any use of accessory muscles.  Crackles bilateral bases.  No wheezing or rhonchi. ?Cardio: S1-S2  is irregularly irregular.  No S3-S4 ?GI: Abdomen is soft.  Nontender nondistended.  Bowel sounds are present normal.  No masses organomegaly ?Extremities: 2+ pitting edema bilateral lower extremities ?Neurologic: Alert and oriented x3.  No focal neurological deficits.  ? ? ?Lab Results: ? ?Data Reviewed: I have personally reviewed labs and imaging study reports ? ?CBC: ?Recent Labs  ?Lab 01/04/22 ?1629 01/05/22 ?1025  ?WBC 5.9 6.8  ?HGB 10.2* 9.5*  ?HCT 34.1* 31.7*  ?MCV 86.3 87.6  ?PLT 281 243  ? ? ?Basic Metabolic Panel: ?Recent Labs  ?Lab 01/04/22 ?1629 01/04/22 ?1831 01/05/22 ?8527  ?NA 139 139 139  ?K 3.3* 3.6 3.4*  ?CL 104 105 103  ?CO2 25 21* 28  ?GLUCOSE 228* 201* 143*  ?BUN 19 18 16   ?CREATININE 0.86 0.92 1.03*  ?CALCIUM 8.8* 8.9 8.6*  ? ? ?GFR: ?Estimated Creatinine Clearance: 53.9 mL/min (A) (by C-G formula based on SCr of 1.03 mg/dL (H)). ? ?Liver Function Tests: ?Recent Labs  ?Lab 01/04/22 ?1831  ?AST 22  ?ALT 12  ?ALKPHOS 83  ?  BILITOT 0.7  ?PROT 7.8  ?ALBUMIN 3.3*  ? ? ? ?CBG: ?Recent Labs  ?Lab 01/04/22 ?2112 01/04/22 ?2234 01/05/22 ?0829  ?GLUCAP 215* 208* 139*  ? ? ? ?Thyroid Function Tests: ?Recent Labs  ?  01/04/22 ?1831  ?TSH 3.901  ? ? ? ?Recent Results (from the past 240 hour(s))  ?Resp Panel by RT-PCR (Flu A&B, Covid) Nasopharyngeal Swab     Status: None  ? Collection Time: 01/04/22  8:05 PM  ? Specimen: Nasopharyngeal Swab; Nasopharyngeal(NP) swabs in vial transport medium  ?Result Value Ref Range Status  ? SARS Coronavirus 2 by RT PCR NEGATIVE NEGATIVE Final  ?  Comment: (NOTE) ?SARS-CoV-2 target nucleic acids are NOT DETECTED. ? ?The SARS-CoV-2 RNA is generally detectable in upper respiratory ?specimens during the acute phase of infection. The lowest ?concentration of SARS-CoV-2 viral copies this assay can detect is ?138 copies/mL. A negative result does not preclude SARS-Cov-2 ?infection and should not be used as the sole basis for treatment or ?other patient management decisions. A  negative result may occur with  ?improper specimen collection/handling, submission of specimen other ?than nasopharyngeal swab, presence of viral mutation(s) within the ?areas targeted by this assay, and inadequate number of vir

## 2022-01-06 ENCOUNTER — Encounter: Payer: Self-pay | Admitting: Nurse Practitioner

## 2022-01-06 DIAGNOSIS — Z434 Encounter for attention to other artificial openings of digestive tract: Secondary | ICD-10-CM | POA: Diagnosis not present

## 2022-01-06 DIAGNOSIS — I5031 Acute diastolic (congestive) heart failure: Secondary | ICD-10-CM | POA: Diagnosis not present

## 2022-01-06 DIAGNOSIS — J9601 Acute respiratory failure with hypoxia: Secondary | ICD-10-CM | POA: Diagnosis not present

## 2022-01-06 DIAGNOSIS — I161 Hypertensive emergency: Secondary | ICD-10-CM | POA: Diagnosis not present

## 2022-01-06 DIAGNOSIS — K8010 Calculus of gallbladder with chronic cholecystitis without obstruction: Secondary | ICD-10-CM | POA: Diagnosis not present

## 2022-01-06 DIAGNOSIS — I480 Paroxysmal atrial fibrillation: Secondary | ICD-10-CM | POA: Diagnosis not present

## 2022-01-06 LAB — CBC
HCT: 32.6 % — ABNORMAL LOW (ref 36.0–46.0)
Hemoglobin: 9.6 g/dL — ABNORMAL LOW (ref 12.0–15.0)
MCH: 25.9 pg — ABNORMAL LOW (ref 26.0–34.0)
MCHC: 29.4 g/dL — ABNORMAL LOW (ref 30.0–36.0)
MCV: 88.1 fL (ref 80.0–100.0)
Platelets: 222 10*3/uL (ref 150–400)
RBC: 3.7 MIL/uL — ABNORMAL LOW (ref 3.87–5.11)
RDW: 15.1 % (ref 11.5–15.5)
WBC: 6.4 10*3/uL (ref 4.0–10.5)
nRBC: 0 % (ref 0.0–0.2)

## 2022-01-06 LAB — ECHOCARDIOGRAM COMPLETE
AR max vel: 1.45 cm2
AV Peak grad: 4.5 mmHg
Ao pk vel: 1.06 m/s
Area-P 1/2: 4.46 cm2
Calc EF: 56.4 %
Height: 61 in
S' Lateral: 2.9 cm
Single Plane A2C EF: 54.2 %
Single Plane A4C EF: 59.5 %
Weight: 3072.01 oz

## 2022-01-06 LAB — FOLATE: Folate: 38 ng/mL (ref >5.9)

## 2022-01-06 LAB — IRON AND TIBC
Iron: 30 ug/dL (ref 28–170)
Saturation Ratios: 9 % — ABNORMAL LOW (ref 10.4–31.8)
TIBC: 350 ug/dL (ref 250–450)
UIBC: 320 ug/dL

## 2022-01-06 LAB — BASIC METABOLIC PANEL
Anion gap: 8 (ref 5–15)
BUN: 13 mg/dL (ref 8–23)
CO2: 31 mmol/L (ref 22–32)
Calcium: 8.8 mg/dL — ABNORMAL LOW (ref 8.9–10.3)
Chloride: 102 mmol/L (ref 98–111)
Creatinine, Ser: 0.92 mg/dL (ref 0.44–1.00)
GFR, Estimated: >60 mL/min (ref >60)
Glucose, Bld: 110 mg/dL — ABNORMAL HIGH (ref 70–99)
Potassium: 3 mmol/L — ABNORMAL LOW (ref 3.5–5.1)
Sodium: 141 mmol/L (ref 135–145)

## 2022-01-06 LAB — MAGNESIUM: Magnesium: 1.6 mg/dL — ABNORMAL LOW (ref 1.7–2.4)

## 2022-01-06 LAB — RETICULOCYTES
Immature Retic Fract: 32.4 % — ABNORMAL HIGH (ref 2.3–15.9)
RBC.: 3.71 MIL/uL — ABNORMAL LOW (ref 3.87–5.11)
Retic Count, Absolute: 60.8 10*3/uL (ref 19.0–186.0)
Retic Ct Pct: 1.6 % (ref 0.4–3.1)

## 2022-01-06 LAB — GLUCOSE, CAPILLARY
Glucose-Capillary: 108 mg/dL — ABNORMAL HIGH (ref 70–99)
Glucose-Capillary: 171 mg/dL — ABNORMAL HIGH (ref 70–99)
Glucose-Capillary: 140 mg/dL — ABNORMAL HIGH (ref 70–99)
Glucose-Capillary: 141 mg/dL — ABNORMAL HIGH (ref 70–99)

## 2022-01-06 LAB — FERRITIN: Ferritin: 29 ng/mL (ref 11–307)

## 2022-01-06 LAB — VITAMIN B12: Vitamin B-12: 862 pg/mL (ref 180–914)

## 2022-01-06 MED ORDER — ENOXAPARIN SODIUM 40 MG/0.4ML IJ SOSY
0.5000 mg/kg | PREFILLED_SYRINGE | INTRAMUSCULAR | Status: AC
  Administered 2022-01-07 – 2022-01-08 (×2): 42.5 mg via SUBCUTANEOUS
  Filled 2022-01-06 (×2): qty 0.8

## 2022-01-06 MED ORDER — POTASSIUM CHLORIDE CRYS ER 20 MEQ PO TBCR
40.0000 meq | EXTENDED_RELEASE_TABLET | ORAL | Status: AC
  Administered 2022-01-06 (×2): 40 meq via ORAL
  Filled 2022-01-06 (×2): qty 2

## 2022-01-06 MED ORDER — MAGNESIUM SULFATE 4 GM/100ML IV SOLN
4.0000 g | Freq: Once | INTRAVENOUS | Status: AC
  Administered 2022-01-06: 4 g via INTRAVENOUS
  Filled 2022-01-06: qty 100

## 2022-01-06 MED ORDER — DILTIAZEM HCL ER COATED BEADS 120 MG PO CP24
120.0000 mg | ORAL_CAPSULE | Freq: Every day | ORAL | Status: DC
  Administered 2022-01-06 – 2022-01-08 (×3): 120 mg via ORAL
  Filled 2022-01-06 (×3): qty 1

## 2022-01-06 MED ORDER — POTASSIUM CHLORIDE CRYS ER 20 MEQ PO TBCR
20.0000 meq | EXTENDED_RELEASE_TABLET | Freq: Once | ORAL | Status: AC
Start: 2022-01-06 — End: 2022-01-06
  Administered 2022-01-06: 20 meq via ORAL
  Filled 2022-01-06: qty 1

## 2022-01-06 NOTE — Progress Notes (Signed)
01/06/2022 ? ?Subjective: ?No acute events overnight.  Patient reports that she feels her swelling continues to improve.  Denies any shortness of breath.  Denies any abdominal pain. ? ?Vital signs: ?Temp:  [97.6 ?F (36.4 ?C)-98.7 ?F (37.1 ?C)] 97.8 ?F (36.6 ?C) (03/19 1225) ?Pulse Rate:  [86-104] 91 (03/19 1225) ?Resp:  [16-25] 16 (03/19 1225) ?BP: (110-156)/(66-100) 110/66 (03/19 1225) ?SpO2:  [97 %-100 %] 97 % (03/19 1225) ?Weight:  [83.4 kg] 83.4 kg (03/19 0500)  ? ?Intake/Output: ?03/18 0701 - 03/19 0700 ?In: -  ?Out: 2600 [Urine:2600] ?Last BM Date : 01/04/22 ? ?Physical Exam: ?Constitutional: No acute distress ?Abdomen: Soft, nondistended, nontender to palpation.  Right upper quadrant drain in place with bilious fluid with some mild purulence in the bag. ? ?Labs:  ?Recent Labs  ?  01/05/22 ?9244 01/06/22 ?0501  ?WBC 6.8 6.4  ?HGB 9.5* 9.6*  ?HCT 31.7* 32.6*  ?PLT 243 222  ? ?Recent Labs  ?  01/05/22 ?6286 01/06/22 ?0501  ?NA 139 141  ?K 3.4* 3.0*  ?CL 103 102  ?CO2 28 31  ?GLUCOSE 143* 110*  ?BUN 16 13  ?CREATININE 1.03* 0.92  ?CALCIUM 8.6* 8.8*  ? ?No results for input(s): LABPROT, INR in the last 72 hours. ? ?Imaging: ?No results found. ? ?Assessment/Plan: ?This is a 66 y.o. female with a percutaneous cholecystostomy drain for prior episode of acute cholecystitis, admitted with CHF exacerbation and edema. ? ?- Patient reports that she continues to improve and indeed she does appear more comfortable with decreased respiratory issues and improving edema. ?- Discussed yesterday with Dr. Gwen Pounds and Dr. Rito Ehrlich about the possibility of keeping the patient on her schedule for cholecystectomy on 3/22.  Both have agreed that it would be safe for her to undergo surgery she has been continuing to improve.. ?- We will discontinue the patient's Eliquis after today's doses. ?- We will continue following with you.  Discussed with the patient that if she continues to improve well and is able to be discharged home, that  that is okay and we can still proceed with the surgical scheduling for surgery on 01/09/2022.  If the patient remains an inpatient by then, we can still do her surgery as well. ? ? ?I spent 35 minutes dedicated to the care of this patient on the date of this encounter to include pre-visit review of records, face-to-face time with the patient discussing diagnosis and management, and any post-visit coordination of care. ? ?Howie Ill, MD ?[facility]  ?

## 2022-01-06 NOTE — Progress Notes (Signed)
[facility] Cardiology [facility] Encounter Note ? ?Patient: Amanda [name] / Admit Date: 01/04/2022 / Date of Encounter: 01/06/2022, 8:50 AM ? ? ?Subjective: ?Patient feels much better today with less shortness of breath and significant urine output with intravenous Lasix to greater than 4 L.  Patient's heart rate with atrial fibrillation slightly elevated and will need a little medication management changes.  No evidence of acute coronary syndrome or other significant symptoms ? ?Review of Systems: ?Positive for: Shortness of breath ?Negative for: Vision change, hearing change, syncope, dizziness, nausea, vomiting,diarrhea, bloody stool, stomach pain, cough, congestion, diaphoresis, urinary frequency, urinary pain,skin lesions, skin rashes ?Others previously listed ? ?Objective: ?Telemetry: Atrial fibrillation ?Physical Exam: Blood pressure (!) 145/81, pulse (!) 103, temperature 97.8 ?F (36.6 ?C), temperature source Oral, resp. rate 18, height 5\' 1"  (1.549 m), weight 83.4 kg, SpO2 100 %. Body mass index is 34.74 kg/m?. ?General: Well developed, well nourished, in no acute distress. ?Head: Normocephalic, atraumatic, sclera non-icteric, no xanthomas, nares are without discharge. ?Neck: No apparent masses ?Lungs: Normal respirations with no wheezes, no rhonchi, no rales , no crackles  ? Heart: Irregular rate and rhythm, normal S1 S2, no murmur, no rub, no gallop, PMI is normal size and placement, carotid upstroke normal without bruit, jugular venous pressure normal ?Abdomen: Soft, non-tender, non-distended with normoactive bowel sounds. No hepatosplenomegaly. Abdominal aorta is normal size without bruit ?Extremities: Trace to 1+ edema, no clubbing, no cyanosis, no ulcers,  ?Peripheral: 2+ radial, 2+ femoral, 2+ dorsal pedal pulses ?Neuro: Alert and oriented. Moves all extremities spontaneously. ?Psych:  Responds to questions appropriately with a normal affect. ? ? ?Intake/Output Summary (Last 24 hours) at 01/06/2022  0850 ?Last data filed at 01/05/2022 2000 ?Gross per 24 hour  ?Intake --  ?Output 2600 ml  ?Net -2600 ml  ? ? ?Inpatient Medications:  ? apixaban  5 mg Oral BID  ? carvedilol  12.5 mg Oral BID WC  ? Chlorhexidine Gluconate Cloth  6 each Topical Daily  ? diltiazem  120 mg Oral Daily  ? furosemide  40 mg Intravenous Q12H  ? insulin aspart  0-20 Units Subcutaneous TID WC  ? insulin aspart  0-5 Units Subcutaneous QHS  ? potassium chloride  40 mEq Oral Q4H  ? sodium chloride flush  5 mL Intracatheter Daily  ? ?Infusions:  ? magnesium sulfate bolus IVPB 4 g (01/06/22 0823)  ? ? ?Labs: ?Recent Labs  ?  01/05/22 ?[id number] 01/06/22 ?0501  ?NA 139 141  ?K 3.4* 3.0*  ?CL 103 102  ?CO2 28 31  ?GLUCOSE 143* 110*  ?BUN 16 13  ?CREATININE 1.03* 0.92  ?CALCIUM 8.6* 8.8*  ?MG  --  1.6*  ? ?Recent Labs  ?  01/04/22 ?1831  ?AST 22  ?ALT 12  ?ALKPHOS 83  ?BILITOT 0.7  ?PROT 7.8  ?ALBUMIN 3.3*  ? ?Recent Labs  ?  01/05/22 ?[id number] 01/06/22 ?0501  ?WBC 6.8 6.4  ?HGB 9.5* 9.6*  ?HCT 31.7* 32.6*  ?MCV 87.6 88.1  ?PLT 243 222  ? ?No results for input(s): CKTOTAL, CKMB, TROPONINI in the last 72 hours. ?Invalid input(s): POCBNP ?No results for input(s): HGBA1C in the last 72 hours.  ? ?Weights: ?Filed Weights  ? 01/04/22 1706 01/06/22 0500  ?Weight: 87.1 kg 83.4 kg  ? ? ? ?Radiology/Studies:  ?DG Chest 2 View ? ?Result Date: 01/04/2022 ?CLINICAL DATA:  Shortness of breath, cough EXAM: CHEST - 2 VIEW COMPARISON:  Radiograph 11/02/2021 FINDINGS: Enlarged cardiac silhouette. There are diffuse interstitial and  lower lung predominant airspace opacities. Probable trace effusions. Low lung volumes. No visible pneumothorax. No acute osseous abnormality. Thoracic spondylosis. IMPRESSION: Cardiomegaly with mild pulmonary edema. Probable trace pleural effusions. Electronically Signed   By: Maurine Simmering M.D.   On: 01/04/2022 16:56  ? ?CT Angio Chest PE W and/or Wo Contrast ? ?Result Date: 01/04/2022 ?CLINICAL DATA:  Pulmonary embolism (PE) suspected, high prob. ,  reports she has been short of breath for several weeks with symptoms worsening today. Denies CP or productive cough. Bilateral leg swelling and abdominal distention EXAM: CT ANGIOGRAPHY CHEST WITH CONTRAST TECHNIQUE: Multidetector CT imaging of the chest was performed using the standard protocol during bolus administration of intravenous contrast. Multiplanar CT image reconstructions and MIPs were obtained to evaluate the vascular anatomy. RADIATION DOSE REDUCTION: This exam was performed according to the departmental dose-optimization program which includes automated exposure control, adjustment of the mA and/or kV according to patient size and/or use of iterative reconstruction technique. CONTRAST:  63mL OMNIPAQUE IOHEXOL 350 MG/ML SOLN COMPARISON:  CT abdomen pelvis 11/02/2021 FINDINGS: Cardiovascular: Satisfactory opacification of the pulmonary arteries to the segmental level. No evidence of pulmonary embolism. Normal heart size. Trace pericardial effusion. The thoracic aorta is normal in caliber. Mild atherosclerotic plaque of the thoracic aorta. Four-vessel coronary artery calcifications. Mediastinum/Nodes: No enlarged mediastinal, hilar, or axillary lymph nodes. Thyroid gland, trachea, and esophagus demonstrate no significant findings. Lungs/Pleura: Right upper lobe peribronchovascular consolidation (5:170). Passive atelectasis of bilateral lower lobes. Persistent (from 11/02/2021) punctate left lower and right middle lobe calcified pulmonary nodule.(5:46, 203). No pulmonary mass. Interval development of a moderate right and interval increase in size of a trace to small volume left pleural effusions. No pneumothorax. Upper Abdomen: Partially visualized at least trace to small volume upper abdominal ascites. Musculoskeletal: No abdominal wall hernia or abnormality. No suspicious lytic or blastic osseous lesions. No acute displaced fracture. Multilevel degenerative changes of the spine. Review of the MIP images  confirms the above findings. IMPRESSION: 1. No pulmonary embolus. 2. Interval development of a moderate right and interval increase in size of a trace to small volume left pleural effusions. 3. Trace pericardial effusion. 4. Right upper lobe peribronchovascular consolidation. Finding could represent a combination of atelectasis versus infection/inflammation. Limited evaluation due to timing of contrast. 5. Persistent punctate left lower and right middle lobe calcified pulmonary nodules. No follow-up needed if patient is low-risk (and has no known or suspected primary neoplasm). Non-contrast chest CT can be considered in 12 months if patient is high-risk. This recommendation follows the consensus statement: Guidelines for Management of Incidental Pulmonary Nodules Detected on CT Images: From the Fleischner Society 2017; Radiology 2017; 284:228-243. 6. Partially visualized at least trace to small volume upper abdominal ascites. 7. Aortic Atherosclerosis (ICD10-I70.0) -including four-vessel. Electronically Signed   By: Iven Finn M.D.   On: 01/04/2022 19:41  ? ?US Venous Img Lower Bilateral ? ?Result Date: 01/04/2022 ?CLINICAL DATA:  Bilateral lower extremity swelling. EXAM: BILATERAL LOWER EXTREMITY VENOUS DOPPLER ULTRASOUND TECHNIQUE: Gray-scale sonography with graded compression, as well as color Doppler and duplex ultrasound were performed to evaluate the lower extremity deep venous systems from the level of the common femoral vein and including the common femoral, femoral, profunda femoral, popliteal and calf veins including the posterior tibial, peroneal and gastrocnemius veins when visible. The superficial great saphenous vein was also interrogated. Spectral Doppler was utilized to evaluate flow at rest and with distal augmentation maneuvers in the common femoral, femoral and popliteal veins. COMPARISON:  None. FINDINGS:  RIGHT LOWER EXTREMITY Common Femoral Vein: No evidence of thrombus. Normal  compressibility, respiratory phasicity and response to augmentation. Saphenofemoral Junction: No evidence of thrombus. Normal compressibility and flow on color Doppler imaging. Profunda Femoral Vein: No evidence of thr

## 2022-01-06 NOTE — Progress Notes (Addendum)
? ?[facility] ?PROGRESS NOTE ? ? ?Amanda [name] VQQ:[id number] DOB: 1956-05-15 DOA: 01/04/2022  2 ?DOS: the patient was seen and examined on 01/06/2022 ? ?PCP: Loura Pardon, MD ? ?Brief History and Hospital Course:  ?66 y.o. female with medical history significant for DM, HTN, HLD, atrial fibrillation diagnosed during hospitalization from 1/13 to 11/08/2021 for septic shock secondary to acute cholecystitis, discharged with a cholecystostomy tube, currently on Eliquis until 3/19 in preparation for cholecystectomy by Dr. Aleen Campi on 3/22 who presents to the ED with shortness of breath.  She also mention lower extremity edema.  Found to have concerns for pulmonary edema.  Thought to have acute diastolic CHF.  She was hospitalized for further management.  Pleural effusions were noted on CT scan.  ? ?Consultants: Cardiology.  General surgery ? ?Procedures: None ? ? ? ?Subjective: ?Patient mentioned that shortness of breath as well as leg swelling is improving.  Denies any abdominal pain.  No nausea vomiting.   ? ? ? ?Assessment/Plan: ? ? ?* Acute on chronic diastolic CHF (congestive heart failure) (HCC) ?Patient has had weight gain of 12 to 15 pounds with dyspnea on exertion and lower extremity edema, elevated BNP. ?Recent echocardiogram in January showed normal systolic function.  Right ventricular function was normal.  No significant valvular abnormalities were noted.   ?Patient placed on IV diuretics.  She appears to be diuresing well.  Cardiology is following. ?Patient is on beta-blocker, calcium channel blocker.   ?Continue strict ins and outs and daily weights. ? ?Acute respiratory failure with hypoxia (HCC) ?Secondary to CHF pleural effusion.  Initially was requiring 4 L of oxygen.  Currently on 2 L of oxygen by nasal cannula.  Saturations noted to be in the late 90s.  Should be able to wean her off of it. ? ?Acute dyspnea ?Differential diagnosis was broad at the time of admission including CHF and pneumonia.  PE  was ruled out on CT angiogram.  Presentation seems to be primarily due to CHF considering her lower extremity edema.  Bated BNP.  See under CHF. ? ?CAP (community acquired pneumonia) ?Presentation most likely due to CHF rather than an infectious process.  Procalcitonin was less than 0.1.  WBC is normal.  We will not continue with antibiotics at this time. ? ?Bilateral pleural effusion ?Secondary to acute heart failure. See management above ? ?Hypertensive emergency ?Presented with blood pressures in the 180s systolic.  Improved after she was given cardiac medications.  ?Noted to be on carvedilol, diltiazem, furosemide.  Blood pressures are better controlled.  ? ?AF (paroxysmal atrial fibrillation) (HCC) ?Recently diagnosed in January in the setting of septic shock.  Remains in atrial fibrillation.  Heart rate seems to be well controlled.  Anticoagulated with Eliquis.   ?Eliquis will be held after today as there is plan for surgery on March 22.  Discussed with general surgery and cardiology and it seems reasonable to proceed as patient is improving. ?Heart rate seems to be poorly controlled this morning.  Cardiology to review her medications. ? ?Cholecystostomy care [facility]) ?Cholecystostomy drain tube was placed when she was originally diagnosed with cholecystitis January. ?Plan is for surgical intervention on 3/22.  Discussed with general surgery and cardiology.  Okay to proceed with surgery since patient appears to be improving.  Will hold Eliquis after today's doses. ? ?Uncontrolled type 2 diabetes mellitus with hyperglycemia, without long-term current use of insulin (HCC) ?Home medication list showed that patient is supposed to be on Trulicity.  HbA1c 6.9 in  January.  Continue SSI.  Monitor CBGs ? ?Chronic anticoagulation ?See note under atrial fibrillation for details ? ?Multiple pulmonary nodules determined by computed tomography of lung ?Seen on CTA chest.  Depending on risk status that she may need CT scan in  12 months.  Will defer to outpatient providers ? ?Normocytic anemia ?Hemoglobin low but stable compared to previous values.  No evidence of overt bleeding.  Continue to monitor. ?Anemia panel does suggest a degree of iron deficiency with a ferritin of 29, iron of 30, TIBC 350 and percent saturation of 9.  Folic acid 38.  Vitamin B12 level is pending. ?She will be ordered iron supplements at discharge.  And will need further work-up for her anemia in the outpatient setting. ? ?Hypokalemia ?Hypomagnesemia ? ?Replace potassium and magnesium. ? ?Diabetes mellitus without complication (HCC)-resolved as of 01/04/2022 ?Sliding scale insulin ? ? ? ?Obesity ?Estimated body mass index is 34.74 kg/m? as calculated from the following: ?  Height as of this encounter: 5\' 1"  (1.549 m). ?  Weight as of this encounter: 83.4 kg. ? ? ?DVT Prophylaxis: On Eliquis.  To be held after today's doses. ?Code Status: Full code ?Family Communication: Discussed with patient ?Disposition Plan: Hopefully return home in improved ? ?Status is: Inpatient ?Remains inpatient appropriate because: Acute CHF ? ? ? ? ?Medications: Scheduled: ? apixaban  5 mg Oral BID  ? carvedilol  12.5 mg Oral BID WC  ? Chlorhexidine Gluconate Cloth  6 each Topical Daily  ? diltiazem  120 mg Oral Daily  ? [START ON 01/07/2022] enoxaparin (LOVENOX) injection  40 mg Subcutaneous Q24H  ? furosemide  40 mg Intravenous Q12H  ? insulin aspart  0-20 Units Subcutaneous TID WC  ? insulin aspart  0-5 Units Subcutaneous QHS  ? potassium chloride  20 mEq Oral Once  ? potassium chloride  40 mEq Oral Q4H  ? sodium chloride flush  5 mL Intracatheter Daily  ? ?Continuous: ?01/09/2022 **OR** acetaminophen, albuterol, hydrALAZINE, ondansetron **OR** ondansetron (ZOFRAN) IV ? ?Antibiotics: ?Anti-infectives (From admission, onward)  ? ? Start     Dose/Rate Route Frequency Ordered Stop  ? 01/04/22 2000  cefTRIAXone (ROCEPHIN) 2 g in sodium chloride 0.9 % 100 mL IVPB       ? 2 g ?200  mL/hr over 30 Minutes Intravenous  Once 01/04/22 1951 01/04/22 2007  ? 01/04/22 2000  azithromycin (ZITHROMAX) 500 mg in sodium chloride 0.9 % 250 mL IVPB       ? 500 mg ?250 mL/hr over 60 Minutes Intravenous  Once 01/04/22 1951 01/04/22 2116  ? ?  ? ? ?Objective: ? ?Vital Signs ? ?Vitals:  ? 01/05/22 2358 01/06/22 0315 01/06/22 0500 01/06/22 0758  ?BP: 119/83 131/86  (!) 145/81  ?Pulse: 96 87  (!) 103  ?Resp: 18 18  18   ?Temp: 98.3 ?F (36.8 ?C) 97.6 ?F (36.4 ?C)  97.8 ?F (36.6 ?C)  ?TempSrc: Oral Oral  Oral  ?SpO2: 100% 100%  100%  ?Weight:   83.4 kg   ?Height:      ? ? ?Intake/Output Summary (Last 24 hours) at 01/06/2022 1037 ?Last data filed at 01/06/2022 1030 ?Gross per 24 hour  ?Intake 87.2 ml  ?Output 4050 ml  ?Net -3962.8 ml  ? ?Filed Weights  ? 01/04/22 1706 01/06/22 0500  ?Weight: 87.1 kg 83.4 kg  ? ? ?General appearance: Awake alert.  In no distress ?Resp: Improved effort.  No use of accessory muscles.  Crackles bilateral bases.  Improved from yesterday.  Occasional wheezing. ?Cardio: S1-S2 is irregularly irregular.  No S3-S4. ?GI: Abdomen is soft.  Nontender nondistended.  Bowel sounds are present normal.  No masses organomegaly ?Extremities: Improved edema bilateral lower extremity.  1+ currently. ?Neurologic: Alert and oriented x3.  No focal neurological deficits.  ? ? ? ?Lab Results: ? ?Data Reviewed: I have personally reviewed labs and imaging study reports ? ?CBC: ?Recent Labs  ?Lab 01/04/22 ?1629 01/05/22 ?3536 01/06/22 ?0501  ?WBC 5.9 6.8 6.4  ?HGB 10.2* 9.5* 9.6*  ?HCT 34.1* 31.7* 32.6*  ?MCV 86.3 87.6 88.1  ?PLT 281 243 222  ? ? ?Basic Metabolic Panel: ?Recent Labs  ?Lab 01/04/22 ?1629 01/04/22 ?1831 01/05/22 ?1443 01/06/22 ?0501  ?NA 139 139 139 141  ?K 3.3* 3.6 3.4* 3.0*  ?CL 104 105 103 102  ?CO2 25 21* 28 31  ?GLUCOSE 228* 201* 143* 110*  ?BUN 19 18 16 13   ?CREATININE 0.86 0.92 1.03* 0.92  ?CALCIUM 8.8* 8.9 8.6* 8.8*  ?MG  --   --   --  1.6*  ? ? ?GFR: ?Estimated Creatinine Clearance: 58.9  mL/min (by C-G formula based on SCr of 0.92 mg/dL). ? ?Liver Function Tests: ?Recent Labs  ?Lab 01/04/22 ?1831  ?AST 22  ?ALT 12  ?ALKPHOS 83  ?BILITOT 0.7  ?PROT 7.8  ?ALBUMIN 3.3*  ? ? ? ?CBG: ?Recent Labs

## 2022-01-06 NOTE — Progress Notes (Signed)
Chaplain offered support to pt's partner in the hallway as he was heading toward exit. Hospitality was appreciated. ?

## 2022-01-06 NOTE — Plan of Care (Signed)
?  Problem: Cardiac: ?Goal: Ability to achieve and maintain adequate cardiopulmonary perfusion will improve ?Outcome: Progressing ?  ?Problem: Clinical Measurements: ?Goal: Ability to maintain clinical measurements within normal limits will improve ?Outcome: Progressing ?Goal: Respiratory complications will improve ?Outcome: Progressing ?  ?Problem: Clinical Measurements: ?Goal: Will remain free from infection ?Outcome: Not Progressing ?  ?

## 2022-01-07 ENCOUNTER — Ambulatory Visit: Payer: Medicare HMO | Admitting: Internal Medicine

## 2022-01-07 ENCOUNTER — Ambulatory Visit: Payer: Medicare HMO | Admitting: Nurse Practitioner

## 2022-01-07 DIAGNOSIS — I480 Paroxysmal atrial fibrillation: Secondary | ICD-10-CM | POA: Diagnosis not present

## 2022-01-07 DIAGNOSIS — J9601 Acute respiratory failure with hypoxia: Secondary | ICD-10-CM | POA: Diagnosis not present

## 2022-01-07 DIAGNOSIS — I161 Hypertensive emergency: Secondary | ICD-10-CM | POA: Diagnosis not present

## 2022-01-07 DIAGNOSIS — I5031 Acute diastolic (congestive) heart failure: Secondary | ICD-10-CM | POA: Diagnosis not present

## 2022-01-07 LAB — BASIC METABOLIC PANEL
Anion gap: 9 (ref 5–15)
BUN: 13 mg/dL (ref 8–23)
CO2: 31 mmol/L (ref 22–32)
Calcium: 8.8 mg/dL — ABNORMAL LOW (ref 8.9–10.3)
Chloride: 98 mmol/L (ref 98–111)
Creatinine, Ser: 0.95 mg/dL (ref 0.44–1.00)
GFR, Estimated: >60 mL/min (ref >60)
Glucose, Bld: 106 mg/dL — ABNORMAL HIGH (ref 70–99)
Potassium: 3.5 mmol/L (ref 3.5–5.1)
Sodium: 138 mmol/L (ref 135–145)

## 2022-01-07 LAB — CBC
HCT: 32.1 % — ABNORMAL LOW (ref 36.0–46.0)
Hemoglobin: 9.7 g/dL — ABNORMAL LOW (ref 12.0–15.0)
MCH: 26.3 pg (ref 26.0–34.0)
MCHC: 30.2 g/dL (ref 30.0–36.0)
MCV: 87 fL (ref 80.0–100.0)
Platelets: 223 10*3/uL (ref 150–400)
RBC: 3.69 MIL/uL — ABNORMAL LOW (ref 3.87–5.11)
RDW: 15 % (ref 11.5–15.5)
WBC: 6.7 10*3/uL (ref 4.0–10.5)
nRBC: 0 % (ref 0.0–0.2)

## 2022-01-07 LAB — GLUCOSE, CAPILLARY
Glucose-Capillary: 102 mg/dL — ABNORMAL HIGH (ref 70–99)
Glucose-Capillary: 238 mg/dL — ABNORMAL HIGH (ref 70–99)
Glucose-Capillary: 127 mg/dL — ABNORMAL HIGH (ref 70–99)
Glucose-Capillary: 144 mg/dL — ABNORMAL HIGH (ref 70–99)

## 2022-01-07 MED ORDER — CHLORHEXIDINE GLUCONATE CLOTH 2 % EX PADS: 6.0000 | MEDICATED_PAD | Freq: Once | CUTANEOUS | Status: DC

## 2022-01-07 MED ORDER — INDOCYANINE GREEN 25 MG IV SOLR: 2.5000 mg | INTRAVENOUS | Status: DC

## 2022-01-07 MED ORDER — GABAPENTIN 100 MG PO CAPS: 200.0000 mg | ORAL_CAPSULE | ORAL | Status: DC

## 2022-01-07 MED ORDER — POTASSIUM CHLORIDE CRYS ER 20 MEQ PO TBCR
40.0000 meq | EXTENDED_RELEASE_TABLET | Freq: Two times a day (BID) | ORAL | Status: AC
  Administered 2022-01-07 (×2): 40 meq via ORAL
  Filled 2022-01-07 (×2): qty 2

## 2022-01-07 MED ORDER — CEFAZOLIN SODIUM-DEXTROSE 2-4 GM/100ML-% IV SOLN: 2.0000 g | INTRAVENOUS | Status: DC

## 2022-01-07 MED ORDER — ACETAMINOPHEN 500 MG PO TABS: 1000.0000 mg | ORAL_TABLET | ORAL | Status: DC

## 2022-01-07 NOTE — Progress Notes (Signed)
[facility] Encounter Note ? ?Patient: Amanda [name] / Admit Date: 01/04/2022 / Date of Encounter: 01/07/2022, 7:11 AM ? ? ?Subjective: ?Patient feels much better at this time and has had significant urine output over the last several days.  Patient does still have some mild lower extremity edema and slight shortness of breath.  Patient's heart rate control of atrial fibrillation is much better at this time at 84 bpm at rest with adjustments of medication management including addition of low-dose diltiazem to carvedilol. ? ?Review of Systems: ?Positive for: Shortness of breath ?Negative for: Vision change, hearing change, syncope, dizziness, nausea, vomiting,diarrhea, bloody stool, stomach pain, cough, congestion, diaphoresis, urinary frequency, urinary pain,skin lesions, skin rashes ?Others previously listed ? ?Objective: ?Telemetry: Atrial fibrillation ?Physical Exam: Blood pressure 125/80, pulse 90, temperature 98.1 ?F (36.7 ?C), resp. rate 19, height 5\' 1"  (1.549 m), weight 82.4 kg, SpO2 100 %. Body mass index is 34.32 kg/m?. ?General: Well developed, well nourished, in no acute distress. ?Head: Normocephalic, atraumatic, sclera non-icteric, no xanthomas, nares are without discharge. ?Neck: No apparent masses ?Lungs: Normal respirations with no wheezes, no rhonchi, no rales , no crackles  ? Heart: Irregular rate and rhythm, normal S1 S2, no murmur, no rub, no gallop, PMI is normal size and placement, carotid upstroke normal without bruit, jugular venous pressure normal ?Abdomen: Soft, non-tender, non-distended with normoactive bowel sounds. No hepatosplenomegaly. Abdominal aorta is normal size without bruit ?Extremities: Trace to 1+ edema, no clubbing, no cyanosis, no ulcers,  ?Peripheral: 2+ radial, 2+ femoral, 2+ dorsal pedal pulses ?Neuro: Alert and oriented. Moves all extremities spontaneously. ?Psych:  Responds to questions appropriately with a normal affect. ? ? ?Intake/Output Summary  (Last 24 hours) at 01/07/2022 0711 ?Last data filed at 01/07/2022 [phone] ?Gross per 24 hour  ?Intake 207.2 ml  ?Output 3120 ml  ?Net -2912.8 ml  ? ? ? ?Inpatient Medications:  ? carvedilol  12.5 mg Oral BID WC  ? Chlorhexidine Gluconate Cloth  6 each Topical Daily  ? diltiazem  120 mg Oral Daily  ? enoxaparin (LOVENOX) injection  0.5 mg/kg Subcutaneous Q24H  ? furosemide  40 mg Intravenous Q12H  ? insulin aspart  0-20 Units Subcutaneous TID WC  ? insulin aspart  0-5 Units Subcutaneous QHS  ? sodium chloride flush  5 mL Intracatheter Daily  ? ?Infusions:  ? ? ? ?Labs: ?Recent Labs  ?  01/05/22 ?[id number] 01/06/22 ?0501  ?NA 139 141  ?K 3.4* 3.0*  ?CL 103 102  ?CO2 28 31  ?GLUCOSE 143* 110*  ?BUN 16 13  ?CREATININE 1.03* 0.92  ?CALCIUM 8.6* 8.8*  ?MG  --  1.6*  ? ? ?Recent Labs  ?  01/04/22 ?1831  ?AST 22  ?ALT 12  ?ALKPHOS 83  ?BILITOT 0.7  ?PROT 7.8  ?ALBUMIN 3.3*  ? ? ?Recent Labs  ?  01/06/22 ?0501 01/07/22 ?[id number]  ?WBC 6.4 6.7  ?HGB 9.6* 9.7*  ?HCT 32.6* 32.1*  ?MCV 88.1 87.0  ?PLT 222 223  ? ? ?No results for input(s): CKTOTAL, CKMB, TROPONINI in the last 72 hours. ?Invalid input(s): POCBNP ?No results for input(s): HGBA1C in the last 72 hours.  ? ?Weights: ?Filed Weights  ? 01/04/22 1706 01/06/22 0500 01/07/22 0019  ?Weight: 87.1 kg 83.4 kg 82.4 kg  ? ? ? ?Radiology/Studies:  ?DG Chest 2 View ? ?Result Date: 01/04/2022 ?CLINICAL DATA:  Shortness of breath, cough EXAM: CHEST - 2 VIEW COMPARISON:  Radiograph 11/02/2021 FINDINGS: Enlarged cardiac silhouette. There are diffuse  interstitial and lower lung predominant airspace opacities. Probable trace effusions. Low lung volumes. No visible pneumothorax. No acute osseous abnormality. Thoracic spondylosis. IMPRESSION: Cardiomegaly with mild pulmonary edema. Probable trace pleural effusions. Electronically Signed   By: Maurine Simmering M.D.   On: 01/04/2022 16:56  ? ?CT Angio Chest PE W and/or Wo Contrast ? ?Result Date: 01/04/2022 ?CLINICAL DATA:  Pulmonary embolism (PE) suspected,  high prob. , reports she has been short of breath for several weeks with symptoms worsening today. Denies CP or productive cough. Bilateral leg swelling and abdominal distention EXAM: CT ANGIOGRAPHY CHEST WITH CONTRAST TECHNIQUE: Multidetector CT imaging of the chest was performed using the standard protocol during bolus administration of intravenous contrast. Multiplanar CT image reconstructions and MIPs were obtained to evaluate the vascular anatomy. RADIATION DOSE REDUCTION: This exam was performed according to the departmental dose-optimization program which includes automated exposure control, adjustment of the mA and/or kV according to patient size and/or use of iterative reconstruction technique. CONTRAST:  64mL OMNIPAQUE IOHEXOL 350 MG/ML SOLN COMPARISON:  CT abdomen pelvis 11/02/2021 FINDINGS: Cardiovascular: Satisfactory opacification of the pulmonary arteries to the segmental level. No evidence of pulmonary embolism. Normal heart size. Trace pericardial effusion. The thoracic aorta is normal in caliber. Mild atherosclerotic plaque of the thoracic aorta. Four-vessel coronary artery calcifications. Mediastinum/Nodes: No enlarged mediastinal, hilar, or axillary lymph nodes. Thyroid gland, trachea, and esophagus demonstrate no significant findings. Lungs/Pleura: Right upper lobe peribronchovascular consolidation (5:170). Passive atelectasis of bilateral lower lobes. Persistent (from 11/02/2021) punctate left lower and right middle lobe calcified pulmonary nodule.(5:46, 203). No pulmonary mass. Interval development of a moderate right and interval increase in size of a trace to small volume left pleural effusions. No pneumothorax. Upper Abdomen: Partially visualized at least trace to small volume upper abdominal ascites. Musculoskeletal: No abdominal wall hernia or abnormality. No suspicious lytic or blastic osseous lesions. No acute displaced fracture. Multilevel degenerative changes of the spine. Review of  the MIP images confirms the above findings. IMPRESSION: 1. No pulmonary embolus. 2. Interval development of a moderate right and interval increase in size of a trace to small volume left pleural effusions. 3. Trace pericardial effusion. 4. Right upper lobe peribronchovascular consolidation. Finding could represent a combination of atelectasis versus infection/inflammation. Limited evaluation due to timing of contrast. 5. Persistent punctate left lower and right middle lobe calcified pulmonary nodules. No follow-up needed if patient is low-risk (and has no known or suspected primary neoplasm). Non-contrast chest CT can be considered in 12 months if patient is high-risk. This recommendation follows the consensus statement: Guidelines for Management of Incidental Pulmonary Nodules Detected on CT Images: From the Fleischner Society 2017; Radiology 2017; 284:228-243. 6. Partially visualized at least trace to small volume upper abdominal ascites. 7. Aortic Atherosclerosis (ICD10-I70.0) -including four-vessel. Electronically Signed   By: Iven Finn M.D.   On: 01/04/2022 19:41  ? ?US Venous Img Lower Bilateral ? ?Result Date: 01/04/2022 ?CLINICAL DATA:  Bilateral lower extremity swelling. EXAM: BILATERAL LOWER EXTREMITY VENOUS DOPPLER ULTRASOUND TECHNIQUE: Gray-scale sonography with graded compression, as well as color Doppler and duplex ultrasound were performed to evaluate the lower extremity deep venous systems from the level of the common femoral vein and including the common femoral, femoral, profunda femoral, popliteal and calf veins including the posterior tibial, peroneal and gastrocnemius veins when visible. The superficial great saphenous vein was also interrogated. Spectral Doppler was utilized to evaluate flow at rest and with distal augmentation maneuvers in the common femoral, femoral and popliteal veins. COMPARISON:  None. FINDINGS: RIGHT LOWER EXTREMITY Common Femoral Vein: No evidence of thrombus.  Normal compressibility, respiratory phasicity and response to augmentation. Saphenofemoral Junction: No evidence of thrombus. Normal compressibility and flow on color Doppler imaging. Profunda Femoral Vein: N

## 2022-01-07 NOTE — Evaluation (Signed)
Occupational Therapy Evaluation ?Patient Details ?Name: Amanda [name] ?MRN: [id number] ?DOB: 29-May-1956 ?Today's Date: 01/07/2022 ? ? ?History of Present Illness 66 y.o. female with medical history significant for DM, HTN, HLD, atrial fibrillation diagnosed during hospitalization from 1/13 to 11/08/2021 for septic shock secondary to acute cholecystitis, dischargde with a cholecystostomy tube, currently on Eliquis until 3/19 in preparation for cholecystectomy by Dr. Aleen Campi on 3/22 who presents to the ED with shortness of breath.  She was sent into the ED due to concerns for shortness of breath and lower extremity edema  present during her preop anesthesia evaluation after she was noted to be significantly short of breath while speaking.  Patient reports a 1 month history of having progressive lower extremity edema for which she has been using compression, 12 to 15 pound weight gain, dyspnea on exertion.  Her echocardiogram in January 2023 during her hospitalization that showed normal EF and no diastolic dysfunction.  She received cardiac clearance for her upcoming surgery on 12/24/2021.  Patient denies chest pain, cough, fever or chills.  She denies one-sided leg pain  ? ?Clinical Impression ?  ?Patient presenting with decreased Ind in self care, balance, functional mobility/transfers, endurance, and safety awareness. Patient reports being mod I at baseline with self care tasks and mobility with use of RW. She endorses short distance ambulation. Her sister assists her with stepping over tub and onto shower seat for bathing tasks. She lives with boyfriend at home who assists with IADLs. Patient currently functioning at min guard progressing to close supervision with functional mobility and use of RW. Pt is likely close to baseline. O2 removed this session with saturation in 90's throughout session. RN notified and pt remains on RA.  Patient will benefit from acute OT to increase overall independence in the areas of ADLs,  functional mobility, and safety awareness in order to safely discharge home with family to assist as needed.  ?   ? ?Recommendations for follow up therapy are one component of a multi-disciplinary discharge planning process, led by the attending physician.  Recommendations may be updated based on patient status, additional functional criteria and insurance authorization.  ? ?Follow Up Recommendations ? Home health OT  ?  ?Assistance Recommended at Discharge Intermittent Supervision/Assistance  ?Patient can return home with the following A little help with walking and/or transfers;A little help with bathing/dressing/bathroom;Help with stairs or ramp for entrance;Assistance with cooking/housework;Assist for transportation ? ?  ?Functional Status Assessment ? Patient has had a recent decline in their functional status and demonstrates the ability to make significant improvements in function in a reasonable and predictable amount of time.  ?Equipment Recommendations ? None recommended by OT  ?  ?   ?Precautions / Restrictions Precautions ?Precautions: Fall  ? ?  ? ?Mobility Bed Mobility ?  ?  ?  ?  ?  ?  ?  ?General bed mobility comments: Pt seated in recliner chair ?  ? ?Transfers ?Overall transfer level: Needs assistance ?Equipment used: Rolling walker (2 wheels) ?Transfers: Sit to/from Stand ?Sit to Stand: Supervision, Min guard ?  ?  ?  ?  ?  ?General transfer comment: min guard progressing to supervision with sit <>stand and mobility with RW ?  ? ?  ?Balance Overall balance assessment: Needs assistance ?Sitting-balance support: Feet supported ?Sitting balance-Leahy Scale: Good ?  ?  ?Standing balance support: Reliant on assistive device for balance, During functional activity ?Standing balance-Leahy Scale: Fair ?  ?  ?  ?  ?  ?  ?  ?  ?  ?  ?  ?  ?   ? ?  ADL either performed or assessed with clinical judgement  ? ?ADL Overall ADL's : Needs assistance/impaired ?  ?  ?Grooming: Wash/dry hands;Standing;Min guard ?  ?   ?  ?  ?  ?  ?  ?  ?  ?  ?  ?  ?  ?  ?  ?Functional mobility during ADLs: Supervision/safety;Rolling walker (2 wheels) ?   ? ? ? ?Vision Patient Visual Report: No change from baseline ?   ?   ?   ?   ? ?Pertinent Vitals/Pain Pain Assessment ?Pain Assessment: No/denies pain  ? ? ? ?Hand Dominance Right ?  ?Extremity/Trunk Assessment Upper Extremity Assessment ?Upper Extremity Assessment: Generalized weakness ?  ?Lower Extremity Assessment ?Lower Extremity Assessment: Generalized weakness ?  ?  ?  ?Communication Communication ?Communication: No difficulties ?  ?Cognition Arousal/Alertness: Awake/alert ?Behavior During Therapy: [facility] for tasks assessed/performed ?Overall Cognitive Status: Within Functional Limits for tasks assessed ?  ?  ?  ?  ?  ?  ?  ?  ?  ?  ?  ?  ?  ?  ?  ?  ?  ?  ?  ?   ?   ?   ? ? ?Home Living Family/patient expects to be discharged to:: Private residence ?Living Arrangements: Spouse/significant other ?Available Help at Discharge: Available 24 hours/day ?Type of Home: Mobile home ?Home Access: Stairs to enter ?Entrance Stairs-Number of Steps: 5-6 ?Entrance Stairs-Rails: Right;Left;Can reach both ?Home Layout: One level ?  ?  ?Bathroom Shower/Tub: Tub/shower unit ?  ?Bathroom Toilet: Standard ?  ?  ?Home Equipment: Rolling Walker (2 wheels);BSC/3in1;Shower seat ?  ?  ?  ? ?  ?Prior Functioning/Environment Prior Level of Function : Independent/Modified Independent ?  ?  ?  ?  ?  ?  ?  ?ADLs Comments: Pt reports needing min A for LB self care at baseline. She ambulates short distances with RW. She shares IADL tasks with BF. ?  ? ?  ?  ?OT Problem List: Decreased strength;Decreased activity tolerance;Impaired balance (sitting and/or standing);Cardiopulmonary status limiting activity;Decreased safety awareness;Decreased knowledge of use of DME or AE ?  ?   ?OT Treatment/Interventions: Self-care/ADL training;Therapeutic exercise;Therapeutic activities;Energy conservation;DME and/or AE instruction;Manual  therapy;Balance training;Patient/family education;Cognitive remediation/compensation  ?  ?OT Goals(Current goals can be found in the care plan section) Acute Rehab OT Goals ?Patient Stated Goal: to go home ?OT Goal Formulation: With patient ?Time For Goal Achievement: 01/21/22 ?Potential to Achieve Goals: Fair ?ADL Goals ?Pt Will Perform Grooming: (P) with modified independence;standing ?Pt Will Transfer to Toilet: (P) with modified independence;ambulating ?Pt Will Perform Toileting - Clothing Manipulation and hygiene: (P) with modified independence;sit to/from stand ?Pt/caregiver will Perform Home Exercise Program: (P) Both right and left upper extremity;With theraband;With written HEP provided;With Supervision  ?OT Frequency: Min 2X/week ?  ? ?   ?AM-PAC OT "6 Clicks" Daily Activity     ?Outcome Measure Help from another person eating meals?: None ?Help from another person taking care of personal grooming?: A Little ?Help from another person toileting, which includes using toliet, bedpan, or urinal?: A Little ?Help from another person bathing (including washing, rinsing, drying)?: A Little ?Help from another person to put on and taking off regular upper body clothing?: None ?Help from another person to put on and taking off regular lower body clothing?: A Little ?6 Click Score: 20 ?  ?End of Session Equipment Utilized During Treatment: Rolling walker (2 wheels) ?Nurse Communication: Mobility status;Other (comment) (removal of O2) ? ?Activity Tolerance:  Patient tolerated treatment well ?Patient left: in chair;with call bell/phone within reach;with chair alarm set ? ?OT Visit Diagnosis: Unsteadiness on feet (R26.81);Muscle weakness (generalized) (M62.81)  ?              ?Time: 7829-5621 ?OT Time Calculation (min): 25 min ?Charges:  OT General Charges ?$OT Visit: 1 Visit ?OT Evaluation ?$OT Eval Moderate Complexity: 1 Mod ?OT Treatments ?$Therapeutic Activity: 8-22 mins ? ?Jackquline Denmark, MS, OTR/L , CBIS ?ascom  [phone]  ?01/07/22, 3:01 PM  ?

## 2022-01-07 NOTE — Progress Notes (Signed)
?   01/07/22 1100  ?Clinical Encounter Type  ?Visited With Patient  ?Visit Type Initial  ?Referral From Chaplain  ?Consult/Referral To Chaplain  ? ?Chaplain supported patient as she spoke about hospital stay. Patient has support form family. Patient appreciated Chaplain visit. ?

## 2022-01-07 NOTE — Consult Note (Signed)
? ?  Heart Failure Nurse Navigator Note ? 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  Diastolic dysfunction could not be evaluated.  Moderate mitral regurgitation. ? ?She presented to the emergency room with complaints of worsening shortness of breath and lower extremity edema. ? ?Comorbidities: ? ?Diabetes ?Hypertension ?Hyperlipidemia ?Atrial fibrillation ?Anxiety ?Arthritis ? ?Medications: ? ?Carvedilol 12.5 mg 2 times a day with meals ?Diltiazem 120 mg daily ?Furosemide 40 mg IV every 12 hours ?Potassium chloride 40 mEq 2 times daily ? ?Eliquis 5 mg 2 times a day is currently on hold for upcoming surgery. ? ? ?Labs: ? ?Sodium 138, potassium 3.5, chloride 98, CO2 31, BUN 13, creatinine 0.95 ?Intake 207 mL ?Output 3120 mL ?Weight 82.4 kg. ? ?Initial meeting with the patient, present with her as her significant other. ? ?She states that she does not have a scale but would be able to obtain 1.  Explained the importance of weighing daily and what to report. ? ?She is responsible for fixing the meals.  She does cook with some salt and at the table does add salt to her egg.  Went over sodium restriction along with fluid restriction. ? ?She was given the living with heart failure teaching booklet, zone magnet and information on low-sodium foods. ? ?Also discussed the ventricle health program of which she is a [facility] patient.  Patient does not send or get emails nor does she have the capabilities of video chatting.  She was given the information about this program. ? ?She has follow-up appointment in the outpatient heart failure clinic on March 29 at 12:30 in the afternoon.  She has a 2% rating of no-show appointments which is 1 out of 53. ? ?Tresa Endo RN [facility] ?

## 2022-01-07 NOTE — Progress Notes (Signed)
? ?[facility] ?PROGRESS NOTE ? ? ?Amanda [name]:[id number] DOB: [birth year] DOA: 01/04/2022  3 ?DOS: the patient was seen and examined on 01/07/2022 ? ?PCP: Loura Pardon, MD ? ?Brief History and Hospital Course:  ?66 y.o. female with medical history significant for DM, HTN, HLD, atrial fibrillation diagnosed during hospitalization from 1/13 to 11/08/2021 for septic shock secondary to acute cholecystitis, discharged with a cholecystostomy tube, currently on Eliquis until 3/19 in preparation for cholecystectomy by Dr. Aleen Campi on 3/22 who presents to the ED with shortness of breath.  She also mention lower extremity edema.  Found to have concerns for pulmonary edema.  Thought to have acute diastolic CHF.  She was hospitalized for further management.  Pleural effusions were noted on CT scan.  Patient was seen by cardiology and by general surgery.  She is diuresing well.  Cardizem was added for better rate control of atrial fibrillation.  Plan is for surgical intervention on 01/09/2022. ? ?Consultants: Cardiology.  General surgery ? ?Procedures: None ? ? ? ?Subjective: ?Patient mentions that she is feeling better.  Shortness of breath is improved.  No chest pain.  Leg swelling is getting better.  Sister at the bedside.   ? ? ?Assessment/Plan: ? ? ?* Acute on chronic diastolic CHF (congestive heart failure) (HCC) ?Patient has had weight gain of 12 to 15 pounds with dyspnea on exertion and lower extremity edema, elevated BNP. ?Recent echocardiogram in January showed normal systolic function.  Right ventricular function was normal.  No significant valvular abnormalities were noted.   ?Patient placed on IV diuretics.  Patient is diuresing well.  Negative by about 7.8 L.  Her weight has decreased as well. ?Cardiology continues to follow.  Remains on Lasix 40 mg twice a day. ?Patient is on beta-blocker, calcium channel blocker.   ?Continue strict ins and outs and daily weights. ? ?Acute respiratory failure with hypoxia  (HCC) ?Secondary to CHF pleural effusion.  Initially was requiring 4 L of oxygen.  Currently on 2 L.  Saturations in the late 90s.  Should be able to wean her off of oxygen in the next 24 hours.  Maintain saturations greater than 90%.   ? ?Acute dyspnea ?Differential diagnosis was broad at the time of admission including CHF and pneumonia.  PE was ruled out on CT angiogram.  Presentation seems to be primarily due to CHF considering her lower extremity edema.  Bated BNP.  See under CHF. ? ?CAP (community acquired pneumonia) ?Community-acquired pneumonia has been ruled out.  Presentation most likely due to CHF rather than an infectious process.  Procalcitonin was less than 0.1.  WBC is normal.  We will not continue with antibiotics at this time. ? ?Bilateral pleural effusion ?Secondary to acute heart failure. See management above ? ?Hypertensive emergency ?Presented with blood pressures in the 180s systolic.  Improved after she was given cardiac medications.  ?Noted to be on carvedilol, diltiazem, furosemide.  ?Blood pressure is reasonably well controlled. ? ?AF (paroxysmal atrial fibrillation) (HCC) ?Recently diagnosed in January in the setting of septic shock.  Remains in atrial fibrillation.  Anticoagulated with Eliquis.   ?Eliquis has been held and preparations for cholecystectomy on March 22.   ?Heart rate was poorly controlled yesterday.  Cardizem was added by cardiology.  Seems to be better this morning.   ? ?Cholecystostomy care [facility]) ?Cholecystostomy drain tube was placed when she was originally diagnosed with cholecystitis January. ?Plan is for surgical intervention on 3/22.  ?Discussed with general surgery and cardiology.  Okay to proceed with surgery since patient appears to be improving.  ?Eliquis held. ? ?Uncontrolled type 2 diabetes mellitus with hyperglycemia, without long-term current use of insulin (HCC) ?Home medication list showed that patient is supposed to be on Trulicity.  HbA1c 6.9 in January.   Continue SSI.  Monitor CBGs ? ?Chronic anticoagulation ?See note under atrial fibrillation for details ? ?Multiple pulmonary nodules determined by computed tomography of lung ?Seen on CTA chest.  Depending on risk status that she may need CT scan in 12 months.  Will defer to outpatient providers ? ?Normocytic anemia ?Hemoglobin low but stable compared to previous values.  No evidence of overt bleeding.  Continue to monitor. ?Anemia panel does suggest a degree of iron deficiency with a ferritin of 29, iron of 30, TIBC 350 and percent saturation of 9.  Folic acid 38.  Vitamin B12 level is pending. ?She will be ordered iron supplements at discharge.  And will need further work-up for her anemia in the outpatient setting. ? ?Hypokalemia ?Hypomagnesemia ? ?Potassium is better.  Will give additional dose today.  Magnesium was supplemented yesterday.  Will recheck.   ? ?Diabetes mellitus without complication (HCC)-resolved as of 01/04/2022 ?Sliding scale insulin ? ? ? ?Obesity ?Estimated body mass index is 34.32 kg/m? as calculated from the following: ?  Height as of this encounter: 5\' 1"  (1.549 m). ?  Weight as of this encounter: 82.4 kg. ? ? ?DVT Prophylaxis: On Eliquis.  To be held after today's doses. ?Code Status: Full code ?Family Communication: Discussed with patient ?Disposition Plan: Hopefully return home in improved ? ?Status is: Inpatient ?Remains inpatient appropriate because: Acute CHF ? ? ? ? ?Medications: Scheduled: ? carvedilol  12.5 mg Oral BID WC  ? Chlorhexidine Gluconate Cloth  6 each Topical Daily  ? diltiazem  120 mg Oral Daily  ? enoxaparin (LOVENOX) injection  0.5 mg/kg Subcutaneous Q24H  ? furosemide  40 mg Intravenous Q12H  ? insulin aspart  0-20 Units Subcutaneous TID WC  ? insulin aspart  0-5 Units Subcutaneous QHS  ? sodium chloride flush  5 mL Intracatheter Daily  ? ?Continuous: ? **OR** acetaminophen, albuterol, hydrALAZINE, ondansetron **OR** ondansetron (ZOFRAN)  IV ? ?Antibiotics: ?Anti-infectives (From admission, onward)  ? ? Start     Dose/Rate Route Frequency Ordered Stop  ? 01/04/22 2000  cefTRIAXone (ROCEPHIN) 2 g in sodium chloride 0.9 % 100 mL IVPB       ? 2 g ?200 mL/hr over 30 Minutes Intravenous  Once 01/04/22 1951 01/04/22 2007  ? 01/04/22 2000  azithromycin (ZITHROMAX) 500 mg in sodium chloride 0.9 % 250 mL IVPB       ? 500 mg ?250 mL/hr over 60 Minutes Intravenous  Once 01/04/22 1951 01/04/22 2116  ? ?  ? ? ?Objective: ? ?Vital Signs ? ?Vitals:  ? 01/06/22 2345 01/07/22 0019 01/07/22 0523 01/07/22 01/09/22  ?BP: 97/69  125/80 131/90  ?Pulse: 92  90 87  ?Resp:   19 18  ?Temp:   98.1 ?F (36.7 ?C) 98.1 ?F (36.7 ?C)  ?TempSrc:      ?SpO2: 100%  100% 100%  ?Weight:  82.4 kg    ?Height:      ? ? ?Intake/Output Summary (Last 24 hours) at 01/07/2022 0939 ?Last data filed at 01/07/2022 [phone] ?Gross per 24 hour  ?Intake 207.2 ml  ?Output 3120 ml  ?Net -2912.8 ml  ? ? ?Filed Weights  ? 01/04/22 1706 01/06/22 0500 01/07/22 0019  ?Weight: 87.1 kg 83.4 kg  82.4 kg  ? ? ?General appearance: Awake alert.  In no distress ?Resp: Normal effort at rest.  Few crackles at the bases bilaterally.  No wheezing or rhonchi. ?Cardio: S1-S2 is irregularly irregular.  Telemetry reviewed.  Heart rate better controlled compared to yesterday.  Rate in the 80s to 90s. ?GI: Abdomen is soft.  Nontender nondistended.  Bowel sounds are present normal.  No masses organomegaly ?Extremities: 1+ pitting edema bilateral lower extremities ?Neurologic: Alert and oriented x3.  No focal neurological deficits.  ? ? ? ? ?Lab Results: ? ?Data Reviewed: I have personally reviewed labs and imaging study reports ? ?CBC: ?Recent Labs  ?Lab 01/04/22 ?1629 01/05/22 ?4401 01/06/22 ?0501 01/07/22 ?0272  ?WBC 5.9 6.8 6.4 6.7  ?HGB 10.2* 9.5* 9.6* 9.7*  ?HCT 34.1* 31.7* 32.6* 32.1*  ?MCV 86.3 87.6 88.1 87.0  ?PLT 281 243 222 223  ? ? ? ?Basic Metabolic Panel: ?Recent Labs  ?Lab 01/04/22 ?1629 01/04/22 ?1831 01/05/22 ?5366  01/06/22 ?0501 01/07/22 ?4403  ?NA 139 139 139 141 138  ?K 3.3* 3.6 3.4* 3.0* 3.5  ?CL 104 105 103 102 98  ?CO2 25 21* 28 31 31   ?GLUCOSE 228* 201* 143* 110* 106*  ?BUN 19 18 16 13 13   ?CREATININE 0.86 0.92 1.03* 0.92 0

## 2022-01-07 NOTE — Evaluation (Signed)
Physical Therapy Evaluation ?Patient Details ?Name: Amanda [name] ?MRN: [id number] ?DOB: 08-Feb-1956 ?Today's Date: 01/07/2022 ? ?History of Present Illness ? 66 y.o. female with medical history significant for DM, HTN, HLD, atrial fibrillation diagnosed during hospitalization from 1/13 to 11/08/2021 for septic shock secondary to acute cholecystitis, dischargde with a cholecystostomy tube, currently on Eliquis until 3/19 in preparation for cholecystectomy by Dr. Aleen Campi on 3/22 who presents to the ED with shortness of breath. ?  ?Clinical Impression ? Patient received in recliner. Agrees to PT assessment. Patient is min guard for sit to stand from recliner. Ambulated 35 feet with RW and min guard. Stiffness in B LEs. She will continue to benefit from skilled PT while here to improve strength, endurance and functional independence.    ?   ? ?Recommendations for follow up therapy are one component of a multi-disciplinary discharge planning process, led by the attending physician.  Recommendations may be updated based on patient status, additional functional criteria and insurance authorization. ? ?Follow Up Recommendations Home health PT ? ?  ?Assistance Recommended at Discharge PRN  ?Patient can return home with the following ? Help with stairs or ramp for entrance;Assistance with cooking/housework ? ?  ?Equipment Recommendations None recommended by PT  ?Recommendations for Other Services ?    ?  ?Functional Status Assessment Patient has had a recent decline in their functional status and demonstrates the ability to make significant improvements in function in a reasonable and predictable amount of time.  ? ?  ?Precautions / Restrictions Precautions ?Precautions: Fall ?Restrictions ?Weight Bearing Restrictions: No  ? ?  ? ?Mobility ? Bed Mobility ?  ?  ?  ?  ?  ?  ?  ?General bed mobility comments: patient in recliner and remained in recliner ?  ? ?Transfers ?Overall transfer level: Needs assistance ?Equipment used: Rolling  walker (2 wheels) ?Transfers: Sit to/from Stand ?Sit to Stand: Supervision ?  ?  ?  ?  ?  ?  ?  ? ?Ambulation/Gait ?Ambulation/Gait assistance: Min guard ?Gait Distance (Feet): 35 Feet ?Assistive device: Rolling walker (2 wheels) ?Gait Pattern/deviations: Step-through pattern, Decreased step length - right, Decreased step length - left, Trunk flexed ?Gait velocity: decr ?  ?  ?General Gait Details: decreased knee flexion during ambulation. ? ?Stairs ?  ?  ?  ?  ?  ? ?Wheelchair Mobility ?  ? ?Modified Rankin (Stroke Patients Only) ?  ? ?  ? ?Balance Overall balance assessment: Modified Independent ?Sitting-balance support: Feet supported ?Sitting balance-Leahy Scale: Good ?  ?  ?Standing balance support: Bilateral upper extremity supported, During functional activity ?Standing balance-Leahy Scale: Fair ?Standing balance comment: min guard, no lob ?  ?  ?  ?  ?  ?  ?  ?  ?  ?  ?  ?   ? ? ? ?Pertinent Vitals/Pain Pain Assessment ?Pain Assessment: No/denies pain  ? ? ?Home Living Family/patient expects to be discharged to:: Private residence ?Living Arrangements: Spouse/significant other ?Available Help at Discharge: Family;Available 24 hours/day ?Type of Home: Mobile home ?Home Access: Stairs to enter ?Entrance Stairs-Rails: Right;Left;Can reach both ?Entrance Stairs-Number of Steps: 5-6 ?  ?Home Layout: One level ?Home Equipment: Rolling Walker (2 wheels);BSC/3in1;Shower seat ?   ?  ?Prior Function Prior Level of Function : Independent/Modified Independent ?  ?  ?  ?  ?  ?  ?  ?ADLs Comments: Pt reports needing min A for LB self care at baseline. She ambulates short distances with RW. She shares IADL  tasks with BF. ?  ? ? ?Hand Dominance  ? Dominant Hand: Right ? ?  ?Extremity/Trunk Assessment  ? Upper Extremity Assessment ?Upper Extremity Assessment: Generalized weakness ?  ? ?Lower Extremity Assessment ?Lower Extremity Assessment: Generalized weakness ?  ? ?Cervical / Trunk Assessment ?Cervical / Trunk Assessment:  Normal  ?Communication  ? Communication: No difficulties  ?Cognition Arousal/Alertness: Awake/alert ?Behavior During Therapy: [facility] for tasks assessed/performed ?Overall Cognitive Status: Within Functional Limits for tasks assessed ?  ?  ?  ?  ?  ?  ?  ?  ?  ?  ?  ?  ?  ?  ?  ?  ?  ?  ?  ? ?  ?General Comments   ? ?  ?Exercises    ? ?Assessment/Plan  ?  ?PT Assessment Patient needs continued PT services  ?PT Problem List Decreased strength;Decreased mobility;Decreased activity tolerance;Cardiopulmonary status limiting activity ? ?   ?  ?PT Treatment Interventions DME instruction;Functional mobility training;Therapeutic activities;Gait training;Patient/family education;Therapeutic exercise;Stair training   ? ?PT Goals (Current goals can be found in the Care Plan section)  ?Acute Rehab PT Goals ?Patient Stated Goal: to return home ?PT Goal Formulation: With patient ?Time For Goal Achievement: 01/18/22 ?Potential to Achieve Goals: Good ? ?  ?Frequency Min 2X/week ?  ? ? ?Co-evaluation   ?  ?  ?  ?  ? ? ?  ?AM-PAC PT "6 Clicks" Mobility  ?Outcome Measure Help needed turning from your back to your side while in a flat bed without using bedrails?: A Little ?Help needed moving from lying on your back to sitting on the side of a flat bed without using bedrails?: A Little ?Help needed moving to and from a bed to a chair (including a wheelchair)?: A Little ?Help needed standing up from a chair using your arms (e.g., wheelchair or bedside chair)?: A Little ?Help needed to walk in hospital room?: A Little ?Help needed climbing 3-5 steps with a railing? : A Lot ?6 Click Score: 17 ? ?  ?End of Session Equipment Utilized During Treatment: Gait belt ?Activity Tolerance: Patient tolerated treatment well;Patient limited by fatigue ?Patient left: in chair;with call bell/phone within reach ?Nurse Communication: Mobility status ?PT Visit Diagnosis: Other abnormalities of gait and mobility (R26.89);Muscle weakness (generalized)  (M62.81);Difficulty in walking, not elsewhere classified (R26.2) ?  ? ?Time: 3254-9826 ?PT Time Calculation (min) (ACUTE ONLY): 17 min ? ? ?Charges:   PT Evaluation ?$PT Eval Moderate Complexity: 1 Mod ?  ?  ?   ? ? ?Lissa Merlin, PT, GCS ?01/07/22,4:17 PM ? ? ?

## 2022-01-08 DIAGNOSIS — I5033 Acute on chronic diastolic (congestive) heart failure: Secondary | ICD-10-CM

## 2022-01-08 LAB — CBC
HCT: 29.1 % — ABNORMAL LOW (ref 36.0–46.0)
Hemoglobin: 8.8 g/dL — ABNORMAL LOW (ref 12.0–15.0)
MCH: 25.7 pg — ABNORMAL LOW (ref 26.0–34.0)
MCHC: 30.2 g/dL (ref 30.0–36.0)
MCV: 85.1 fL (ref 80.0–100.0)
Platelets: 209 10*3/uL (ref 150–400)
RBC: 3.42 MIL/uL — ABNORMAL LOW (ref 3.87–5.11)
RDW: 15.1 % (ref 11.5–15.5)
WBC: 6.6 10*3/uL (ref 4.0–10.5)
nRBC: 0 % (ref 0.0–0.2)

## 2022-01-08 LAB — BASIC METABOLIC PANEL
Anion gap: 7 (ref 5–15)
BUN: 16 mg/dL (ref 8–23)
CO2: 31 mmol/L (ref 22–32)
Calcium: 8.7 mg/dL — ABNORMAL LOW (ref 8.9–10.3)
Chloride: 98 mmol/L (ref 98–111)
Creatinine, Ser: 0.95 mg/dL (ref 0.44–1.00)
GFR, Estimated: >60 mL/min (ref >60)
Glucose, Bld: 114 mg/dL — ABNORMAL HIGH (ref 70–99)
Potassium: 3.8 mmol/L (ref 3.5–5.1)
Sodium: 136 mmol/L (ref 135–145)

## 2022-01-08 LAB — MAGNESIUM: Magnesium: 1.7 mg/dL (ref 1.7–2.4)

## 2022-01-08 LAB — GLUCOSE, CAPILLARY: Glucose-Capillary: 133 mg/dL — ABNORMAL HIGH (ref 70–99)

## 2022-01-08 MED ORDER — POTASSIUM CHLORIDE CRYS ER 20 MEQ PO TBCR
40.0000 meq | EXTENDED_RELEASE_TABLET | Freq: Two times a day (BID) | ORAL | Status: DC
Start: 2022-01-08 — End: 2022-01-08
  Administered 2022-01-08: 40 meq via ORAL
  Filled 2022-01-08: qty 2

## 2022-01-08 MED ORDER — FUROSEMIDE 40 MG PO TABS: ORAL_TABLET | ORAL | 2 refills | Status: DC

## 2022-01-08 MED ORDER — FERROUS SULFATE 325 (65 FE) MG PO TABS: 325.0000 mg | ORAL_TABLET | Freq: Every day | ORAL | 3 refills | Status: AC

## 2022-01-08 MED ORDER — POTASSIUM CHLORIDE CRYS ER 20 MEQ PO TBCR
20.0000 meq | EXTENDED_RELEASE_TABLET | Freq: Every day | ORAL | 2 refills | Status: DC
Start: 2022-01-08 — End: 2022-05-21

## 2022-01-08 MED ORDER — DILTIAZEM HCL ER COATED BEADS 180 MG PO CP24
180.0000 mg | ORAL_CAPSULE | Freq: Every day | ORAL | Status: DC
Start: 2022-01-08 — End: 2022-01-08
  Administered 2022-01-08: 180 mg via ORAL
  Filled 2022-01-08: qty 1

## 2022-01-08 NOTE — Progress Notes (Signed)
Occupational Therapy Treatment ?Patient Details ?Name: Amanda [name] ?MRN: [id number] ?DOB: [birth year] ?Today's Date: 01/08/2022 ? ? ?History of present illness 66 y.o. female with medical history significant for DM, HTN, HLD, atrial fibrillation diagnosed during hospitalization from 1/13 to 11/08/2021 for septic shock secondary to acute cholecystitis, dischargde with a cholecystostomy tube, currently on Eliquis until 3/19 in preparation for cholecystectomy by Dr. Aleen Campi on 3/22 who presents to the ED with shortness of breath. ?  ?OT comments ?   Upon entering the room, pt seated in recliner chair and reporting pain in L knee. RN called for pain medication per pt request. Pt stands from recliner chair with supervision and ambulates with RW and close supervision ~ 15' to [facility]. Pt able to void and have small BM with increased time. She stands but is unable to perform hygiene without assistance. Pt ambulates back to recliner chair with continued report of pain. OT assisted pt with repositioning in chair for comfort. All needs within reach.   ? ?Recommendations for follow up therapy are one component of a multi-disciplinary discharge planning process, led by the attending physician.  Recommendations may be updated based on patient status, additional functional criteria and insurance authorization. ?   ?Follow Up Recommendations ? Home health OT  ?  ?Assistance Recommended at Discharge Intermittent Supervision/Assistance  ?Patient can return home with the following ? A little help with walking and/or transfers;A little help with bathing/dressing/bathroom;Help with stairs or ramp for entrance;Assistance with cooking/housework;Assist for transportation ?  ?Equipment Recommendations ? None recommended by OT  ?  ?   ?Precautions / Restrictions Precautions ?Precautions: Fall ?Restrictions ?Weight Bearing Restrictions: No  ? ? ?  ? ?Mobility Bed Mobility ?  ?  ?  ?  ?  ?  ?  ?General bed mobility comments: patient in recliner and  remained in recliner ?  ? ?Transfers ?Overall transfer level: Needs assistance ?Equipment used: Rolling walker (2 wheels) ?Transfers: Sit to/from Stand, Bed to chair/wheelchair/BSC ?Sit to Stand: Supervision ?  ?  ?Step pivot transfers: Supervision ?  ?  ?  ?  ?  ?Balance Overall balance assessment: Modified Independent ?Sitting-balance support: Feet supported ?Sitting balance-Leahy Scale: Good ?  ?  ?Standing balance support: Bilateral upper extremity supported, During functional activity ?Standing balance-Leahy Scale: Fair ?Standing balance comment: min guard, no lob ?  ?  ?  ?  ?  ?  ?  ?  ?  ?  ?  ?   ? ?ADL either performed or assessed with clinical judgement  ? ?ADL Overall ADL's : Needs assistance/impaired ?  ?  ?Grooming: Wash/dry hands;Standing;Supervision/safety ?  ?  ?  ?  ?  ?  ?  ?  ?  ?Toilet Transfer: BSC/3in1;Rolling walker (2 wheels);Min guard ?  ?Toileting- Clothing Manipulation and Hygiene: Minimal assistance;Sit to/from stand ?Toileting - Clothing Manipulation Details (indicate cue type and reason): assist for hygiene ?  ?  ?Functional mobility during ADLs: Supervision/safety;Rolling walker (2 wheels) ?  ?  ? ?Extremity/Trunk Assessment Upper Extremity Assessment ?Upper Extremity Assessment: Generalized weakness ?  ?Lower Extremity Assessment ?Lower Extremity Assessment: Generalized weakness ?  ?Cervical / Trunk Assessment ?Cervical / Trunk Assessment: Normal ?  ? ?Vision Patient Visual Report: No change from baseline ?  ?  ?   ?   ? ?Cognition Arousal/Alertness: Awake/alert ?Behavior During Therapy: [facility] for tasks assessed/performed ?Overall Cognitive Status: Within Functional Limits for tasks assessed ?  ?  ?  ?  ?  ?  ?  ?  ?  ?  ?  ?  ?  ?  ?  ?  ?  ?  ?  ?   ?   ?   ?   ? ? ?  Pertinent Vitals/ Pain       Pain Assessment ?Pain Assessment: 0-10 ?Pain Score: 8  ?Pain Location: L knee ?Pain Descriptors / Indicators: Aching, Discomfort ?Pain Intervention(s): Limited activity within patient's  tolerance, Repositioned, Patient requesting pain meds-RN notified, Monitored during session ? ?   ?   ? ?Frequency ? Min 2X/week  ? ? ? ? ?  ?Progress Toward Goals ? ?OT Goals(current goals can now be found in the care plan section) ? Progress towards OT goals: Progressing toward goals ? ?Acute Rehab OT Goals ?Patient Stated Goal: to go home ?OT Goal Formulation: With patient ?Time For Goal Achievement: 01/21/22 ?Potential to Achieve Goals: Fair  ?Plan Discharge plan remains appropriate   ? ?   ?AM-PAC OT "6 Clicks" Daily Activity     ?Outcome Measure ? ? Help from another person eating meals?: None ?Help from another person taking care of personal grooming?: None ?Help from another person toileting, which includes using toliet, bedpan, or urinal?: A Little ?Help from another person bathing (including washing, rinsing, drying)?: A Little ?Help from another person to put on and taking off regular upper body clothing?: None ?Help from another person to put on and taking off regular lower body clothing?: A Little ?6 Click Score: 21 ? ?  ?End of Session Equipment Utilized During Treatment: Rolling walker (2 wheels);Other (comment) ? ?OT Visit Diagnosis: Unsteadiness on feet (R26.81);Muscle weakness (generalized) (M62.81) ?  ?Activity Tolerance Patient tolerated treatment well ?  ?Patient Left in chair;with call bell/phone within reach;with chair alarm set ?  ?Nurse Communication Mobility status;Other (comment) ?  ? ?   ? ?Time: 7564-3329 ?OT Time Calculation (min): 23 min ? ?Charges: OT General Charges ?$OT Visit: 1 Visit ?OT Evaluation ?$OT Eval Moderate Complexity: 1 Mod ?OT Treatments ?$Self Care/Home Management : 23-37 mins ? ?Jackquline Denmark, MS, OTR/L , CBIS ?ascom [phone]  ?01/08/22, 12:53 PM  ?

## 2022-01-08 NOTE — Discharge Summary (Signed)
?[facility] ? ?Physician Discharge Summary  ? ?Patient ID: ?Amanda [name] ?MRN: [id number] ?DOB/AGE: 1956-09-11 66 y.o. ? ?Admit date: 01/04/2022 ?Discharge date:   01/08/2022 ? ? ?PCP: Loura Pardon, MD ? ?DISCHARGE DIAGNOSES:  ?Principal Problem: ?  Acute on chronic diastolic CHF (congestive heart failure) (HCC) ?Active Problems: ?  Acute dyspnea ?  Acute respiratory failure with hypoxia (HCC) ?  Bilateral pleural effusion ?  CAP (community acquired pneumonia) ?  Hypertensive emergency ?  AF (paroxysmal atrial fibrillation) (HCC) ?  Uncontrolled type 2 diabetes mellitus with hyperglycemia, without long-term current use of insulin (HCC) ?  Cholecystostomy care [facility]) ?  Chronic anticoagulation ?  Multiple pulmonary nodules determined by computed tomography of lung ?  Normocytic anemia ?  Hypokalemia ? ? ?RECOMMENDATIONS FOR OUTPATIENT FOLLOW UP: ?Outpatient evaluation of anemia ?Outpatient evaluation of pulmonary nodules ? ? ? ? ?Home Health: PT/OT ?Equipment/Devices: None ? ?CODE STATUS: Full code ? ?DISCHARGE CONDITION: fair ? ?Diet recommendation: Modified carbohydrate ? ?INITIAL HISTORY: ?66 y.o. female with medical history significant for DM, HTN, HLD, atrial fibrillation diagnosed during hospitalization from 1/13 to 11/08/2021 for septic shock secondary to acute cholecystitis, discharged with a cholecystostomy tube, currently on Eliquis until 3/19 in preparation for cholecystectomy by Dr. Aleen Campi on 3/22 who presents to the ED with shortness of breath.  She also mention lower extremity edema.  Found to have concerns for pulmonary edema.  Thought to have acute diastolic CHF.  She was hospitalized for further management.  Pleural effusions were noted on CT scan.  Patient was seen by cardiology and by general surgery.  She is diuresing well.  Cardizem was added for better rate control of atrial fibrillation.   ? ?Consultations: ?Cardiology ?General surgery ? ?Procedures: ?Foley catheter placement.  To be  discontinued prior to discharge ? ? ?HOSPITAL COURSE:  ? ? ?* Acute on chronic diastolic CHF (congestive heart failure) (HCC) ?Patient has had weight gain of 12 to 15 pounds with dyspnea on exertion and lower extremity edema, elevated BNP. ?Recent echocardiogram in January showed normal systolic function.  Right ventricular function was normal.  No significant valvular abnormalities were noted.   ?Patient placed on IV diuretics.  Diuresed well.  Negative by 10 L.  Her weight has decreased as well. ?Furosemide changed to oral.  Continue beta-blocker or calcium channel blocker. ? ?Acute respiratory failure with hypoxia (HCC) ?Secondary to CHF pleural effusion.  Initially was requiring 4 L of oxygen.  Wean down to 2 L and then transitioned to room air.  Saturating in the 90s. ? ?CAP (community acquired pneumonia) ?Community-acquired pneumonia has been ruled out.  Presentation most likely due to CHF rather than an infectious process.  Procalcitonin was less than 0.1.  WBC is normal.  We will not continue with antibiotics at this time. ? ?Bilateral pleural effusion ?Secondary to acute heart failure. See management above ? ?Hypertensive emergency ?Blood pressure improved with diuresis.  Continued on her home medications as well. ? ?AF (paroxysmal atrial fibrillation) (HCC) ?Recently diagnosed in January in the setting of septic shock.  Remains in atrial fibrillation.  Anticoagulated with Eliquis.   ?Eliquis has been held and preparations for cholecystectomy on March 22.   ?Cardizem dose was increased.  Continue with carvedilol.  Heart rate is better controlled. ? ?Cholecyst[facility] care [facility]) ?Cholecystostomy drain tube was placed when she was originally diagnosed with cholecystitis January. ?Plan is for surgical intervention on 3/22.  ?Discussed with general surgery and cardiology.  Okay to proceed with surgery  since patient appears to be improving.  ?Eliquis held. ?Notified by [facility] that there was some issue with insurance  approving her surgery during her current hospital stay.  In view of this patient will be discharged today and she will be asked to come back for surgery tomorrow. ? ?Uncontrolled type 2 diabetes mellitus with hyperglycemia, without long-term current use of insulin (HCC) ?Home medication list showed that patient is supposed to be on Trulicity.  HbA1c 6.9 in January.   ? ?Chronic anticoagulation ?See note under atrial fibrillation for details.  Resume Eliquis when told to do so by general surgery after her cholecystectomy ? ?Multiple pulmonary nodules determined by computed tomography of lung ?Seen on CTA chest.  Depending on risk status that she may need CT scan in 12 months.  Will defer to outpatient providers ? ?Normocytic anemia ?Hemoglobin low but stable compared to previous values.  No evidence of overt bleeding.  Continue to monitor. ?Anemia panel does suggest a degree of iron deficiency with a ferritin of 29, iron of 30, TIBC 350 and percent saturation of 9.  Folic acid 38.  Vitamin B12 level is pending. ?She will be ordered iron supplements at discharge.  And will need further work-up for her anemia in the outpatient setting. ? ?Hypokalemia ?Hypomagnesemia ? ?Potassium is better.  Will be given prescription for potassium at discharge. ? ?Diabetes mellitus without complication (HCC)-resolved as of 01/04/2022 ?Sliding scale insulin ? ? ?Obesity ?Estimated body mass index is 33.7 kg/m? as calculated from the following: ?  Height as of this encounter: 5\' 1"  (1.549 m). ?  Weight as of this encounter: 80.9 kg. ? ?Patient is stable.  Diuresed well.  Saturating normal on room air now.  Foley to be removed today.  Okay for discharge home later today. ? ? ?PERTINENT LABS: ? ?The results of significant diagnostics from this hospitalization (including imaging, microbiology, ancillary and laboratory) are listed below for reference.   ? ?Microbiology: ?Recent Results (from the past 240 hour(s))  ?Resp Panel by RT-PCR (Flu  A&B, Covid) Nasopharyngeal Swab     Status: None  ? Collection Time: 01/04/22  8:05 PM  ? Specimen: Nasopharyngeal Swab; Nasopharyngeal(NP) swabs in vial transport medium  ?Result Value Ref Range Status  ? SARS Coronavirus 2 by RT PCR NEGATIVE NEGATIVE Final  ?  Comment: (NOTE) ?SARS-CoV-2 target nucleic acids are NOT DETECTED. ? ?The SARS-CoV-2 RNA is generally detectable in upper respiratory ?specimens during the acute phase of infection. The lowest ?concentration of SARS-CoV-2 viral copies this assay can detect is ?138 copies/mL. A negative result does not preclude SARS-Cov-2 ?infection and should not be used as the sole basis for treatment or ?other patient management decisions. A negative result may occur with  ?improper specimen collection/handling, submission of specimen other ?than nasopharyngeal swab, presence of viral mutation(s) within the ?areas targeted by this assay, and inadequate number of viral ?copies(<138 copies/mL). A negative result must be combined with ?clinical observations, patient history, and epidemiological ?information. The expected result is Negative. ? ?Fact Sheet for Patients:  ?01/06/22 ? ?Fact Sheet for Healthcare Providers:  ?BloggerCourse.com ? ?This test is no t yet approved or cleared by the [employer] FDA and  ?has been authorized for detection and/or diagnosis of SARS-CoV-2 by ?FDA under an Emergency Use Authorization (EUA). This EUA will remain  ?in effect (meaning this test can be used) for the duration of the ?COVID-19 declaration under Section 564(b)(1) of the Act, 21 ?U.S.C.section 360bbb-3(b)(1), unless the authorization is terminated  ?  or revoked sooner.  ? ? ?  ? Influenza A by PCR NEGATIVE NEGATIVE Final  ? Influenza B by PCR NEGATIVE NEGATIVE Final  ?  Comment: (NOTE) ?The Xpert Xpress SARS-CoV-2/FLU/RSV plus assay is intended as an aid ?in the diagnosis of influenza from Nasopharyngeal swab specimens  and ?should not be used as a sole basis for treatment. Nasal washings and ?aspirates are unacceptable for Xpert Xpress SARS-CoV-2/FLU/RSV ?testing. ? ?Fact Sheet for Patients: ?https://sampson.info/

## 2022-01-08 NOTE — TOC Transition Note (Signed)
Transition of Care (TOC) - CM/SW Discharge Note ? ? ?Patient Details  ?Name: Amanda [name] ?MRN: [id number] ?Date of Birth: December 12, 1955 ? ?Transition of Care (TOC) CM/SW Contact:  ?Gildardo Griffes, LCSW ?Phone Number: ?01/08/2022, 12:01 PM ? ? ?Clinical Narrative:    ? ?Patient to dc home today via family transport. Patient reports being active with [facility], CSW has informed [place] with [facility] of dc today.  ? ?No further needs identified.  ? ? ?Final next level of care: Home w Home Health Services ?Barriers to Discharge: No Barriers Identified ? ? ?Patient Goals and CMS Choice ?Patient states their goals for this hospitalization and ongoing recovery are:: to go home ?CMS Medicare.gov Compare Post Acute Care list provided to:: Patient ?Choice offered to / list presented to : Patient ? ?Discharge Placement ?  ?           ?  ?  ?  ?Patient and family notified of of transfer: 01/08/22 ? ?Discharge Plan and Services ?  ?  ?           ?  ?  ?  ?  ?  ?HH Arranged: PT, OT, RN ?HH Agency: [facility] ?Date HH Agency Contacted: 01/08/22 ?Time HH Agency Contacted: 1200 ?Representative spoke with at [facility] Agency: [place] ? ?Social Determinants of Health (SDOH) Interventions ?  ? ? ?Readmission Risk Interventions ?No flowsheet data found. ? ? ? ? ?

## 2022-01-08 NOTE — Care Management Important Message (Signed)
Important Message ? ?Patient Details  ?Name: Amanda [name] ?MRN: [id number] ?Date of Birth: 05-18-1956 ? ? ?Medicare Important Message Given:  Yes ? ? ? ? ?Johnell Comings ?01/08/2022, 11:25 AM ?

## 2022-01-08 NOTE — Progress Notes (Signed)
Pt discharged, husband with pt. All belonings with pt. ?

## 2022-01-08 NOTE — Progress Notes (Signed)
[facility] Encounter Note ? ?Patient: Amanda [name] / Admit Date: 01/04/2022 / Date of Encounter: 01/08/2022, 9:08 AM ? ? ?Subjective: ?Patient feels much better at this time and has had significant urine output over the last several days.  Patient does still have some mild lower extremity edema and slight shortness of breath.  Overall patient's heart rate control is reasonable although with ambulation appears to be 110 bpm.  Therefore slight adjustments of medication management is in order ?Review of Systems: ?Positive for: Shortness of breath ?Negative for: Vision change, hearing change, syncope, dizziness, nausea, vomiting,diarrhea, bloody stool, stomach pain, cough, congestion, diaphoresis, urinary frequency, urinary pain,skin lesions, skin rashes ?Others previously listed ? ?Objective: ?Telemetry: Atrial fibrillation ?Physical Exam: Blood pressure 109/67, pulse 94, temperature 98.2 ?F (36.8 ?C), resp. rate 18, height 5\' 1"  (1.549 m), weight 80.9 kg, SpO2 95 %. Body mass index is 33.7 kg/m?. ?General: Well developed, well nourished, in no acute distress. ?Head: Normocephalic, atraumatic, sclera non-icteric, no xanthomas, nares are without discharge. ?Neck: No apparent masses ?Lungs: Normal respirations with no wheezes, no rhonchi, no rales , no crackles  ? Heart: Irregular rate and rhythm, normal S1 S2, no murmur, no rub, no gallop, PMI is normal size and placement, carotid upstroke normal without bruit, jugular venous pressure normal ?Abdomen: Soft, non-tender, non-distended with normoactive bowel sounds. No hepatosplenomegaly. Abdominal aorta is normal size without bruit ?Extremities: Trace to 1+ edema, no clubbing, no cyanosis, no ulcers,  ?Peripheral: 2+ radial, 2+ femoral, 2+ dorsal pedal pulses ?Neuro: Alert and oriented. Moves all extremities spontaneously. ?Psych:  Responds to questions appropriately with a normal affect. ? ? ?Intake/Output Summary (Last 24 hours) at 01/08/2022  0908 ?Last data filed at 01/08/2022 0856 ?Gross per 24 hour  ?Intake 480 ml  ?Output 3000 ml  ?Net -2520 ml  ? ? ? ?Inpatient Medications:  ? [START ON 01/09/2022] acetaminophen  1,000 mg Oral On Call to OR  ? carvedilol  12.5 mg Oral BID WC  ? Chlorhexidine Gluconate Cloth  6 each Topical Daily  ? diltiazem  180 mg Oral Daily  ? furosemide  40 mg Intravenous Q12H  ? [START ON 01/09/2022] gabapentin  200 mg Oral On Call to OR  ? [START ON 01/09/2022] indocyanine green  2.5 mg Intravenous 60 min Pre-Op  ? insulin aspart  0-20 Units Subcutaneous TID WC  ? insulin aspart  0-5 Units Subcutaneous QHS  ? potassium chloride  40 mEq Oral BID  ? sodium chloride flush  5 mL Intracatheter Daily  ? ?Infusions:  ? [START ON 01/09/2022]  ceFAZolin (ANCEF) IV    ? ? ? ?Labs: ?Recent Labs  ?  01/06/22 ?0501 01/07/22 ?0644 01/08/22 ?0429  ?NA 141 138 136  ?K 3.0* 3.5 3.8  ?CL 102 98 98  ?CO2 31 31 31   ?GLUCOSE 110* 106* 114*  ?BUN 13 13 16   ?CREATININE 0.92 0.95 0.95  ?CALCIUM 8.8* 8.8* 8.7*  ?MG 1.6*  --  1.7  ? ? ?No results for input(s): AST, ALT, ALKPHOS, BILITOT, PROT, ALBUMIN in the last 72 hours. ? ?Recent Labs  ?  01/07/22 ?0644 01/08/22 ?0429  ?WBC 6.7 6.6  ?HGB 9.7* 8.8*  ?HCT 32.1* 29.1*  ?MCV 87.0 85.1  ?PLT 223 209  ? ? ?No results for input(s): CKTOTAL, CKMB, TROPONINI in the last 72 hours. ?Invalid input(s): POCBNP ?No results for input(s): HGBA1C in the last 72 hours.  ? ?Weights: ?Filed Weights  ? 01/06/22 0500 01/07/22 0019 01/08/22 0221  ?  Weight: 83.4 kg 82.4 kg 80.9 kg  ? ? ? ?Radiology/Studies:  ?DG Chest 2 View ? ?Result Date: 01/04/2022 ?CLINICAL DATA:  Shortness of breath, cough EXAM: CHEST - 2 VIEW COMPARISON:  Radiograph 11/02/2021 FINDINGS: Enlarged cardiac silhouette. There are diffuse interstitial and lower lung predominant airspace opacities. Probable trace effusions. Low lung volumes. No visible pneumothorax. No acute osseous abnormality. Thoracic spondylosis. IMPRESSION: Cardiomegaly with mild pulmonary  edema. Probable trace pleural effusions. Electronically Signed   By: Maurine Simmering M.D.   On: 01/04/2022 16:56  ? ?CT Angio Chest PE W and/or Wo Contrast ? ?Result Date: 01/04/2022 ?CLINICAL DATA:  Pulmonary embolism (PE) suspected, high prob. , reports she has been short of breath for several weeks with symptoms worsening today. Denies CP or productive cough. Bilateral leg swelling and abdominal distention EXAM: CT ANGIOGRAPHY CHEST WITH CONTRAST TECHNIQUE: Multidetector CT imaging of the chest was performed using the standard protocol during bolus administration of intravenous contrast. Multiplanar CT image reconstructions and MIPs were obtained to evaluate the vascular anatomy. RADIATION DOSE REDUCTION: This exam was performed according to the departmental dose-optimization program which includes automated exposure control, adjustment of the mA and/or kV according to patient size and/or use of iterative reconstruction technique. CONTRAST:  95mL OMNIPAQUE IOHEXOL 350 MG/ML SOLN COMPARISON:  CT abdomen pelvis 11/02/2021 FINDINGS: Cardiovascular: Satisfactory opacification of the pulmonary arteries to the segmental level. No evidence of pulmonary embolism. Normal heart size. Trace pericardial effusion. The thoracic aorta is normal in caliber. Mild atherosclerotic plaque of the thoracic aorta. Four-vessel coronary artery calcifications. Mediastinum/Nodes: No enlarged mediastinal, hilar, or axillary lymph nodes. Thyroid gland, trachea, and esophagus demonstrate no significant findings. Lungs/Pleura: Right upper lobe peribronchovascular consolidation (5:170). Passive atelectasis of bilateral lower lobes. Persistent (from 11/02/2021) punctate left lower and right middle lobe calcified pulmonary nodule.(5:46, 203). No pulmonary mass. Interval development of a moderate right and interval increase in size of a trace to small volume left pleural effusions. No pneumothorax. Upper Abdomen: Partially visualized at least trace to  small volume upper abdominal ascites. Musculoskeletal: No abdominal wall hernia or abnormality. No suspicious lytic or blastic osseous lesions. No acute displaced fracture. Multilevel degenerative changes of the spine. Review of the MIP images confirms the above findings. IMPRESSION: 1. No pulmonary embolus. 2. Interval development of a moderate right and interval increase in size of a trace to small volume left pleural effusions. 3. Trace pericardial effusion. 4. Right upper lobe peribronchovascular consolidation. Finding could represent a combination of atelectasis versus infection/inflammation. Limited evaluation due to timing of contrast. 5. Persistent punctate left lower and right middle lobe calcified pulmonary nodules. No follow-up needed if patient is low-risk (and has no known or suspected primary neoplasm). Non-contrast chest CT can be considered in 12 months if patient is high-risk. This recommendation follows the consensus statement: Guidelines for Management of Incidental Pulmonary Nodules Detected on CT Images: From the Fleischner Society 2017; Radiology 2017; 284:228-243. 6. Partially visualized at least trace to small volume upper abdominal ascites. 7. Aortic Atherosclerosis (ICD10-I70.0) -including four-vessel. Electronically Signed   By: Iven Finn M.D.   On: 01/04/2022 19:41  ? ?US Venous Img Lower Bilateral ? ?Result Date: 01/04/2022 ?CLINICAL DATA:  Bilateral lower extremity swelling. EXAM: BILATERAL LOWER EXTREMITY VENOUS DOPPLER ULTRASOUND TECHNIQUE: Gray-scale sonography with graded compression, as well as color Doppler and duplex ultrasound were performed to evaluate the lower extremity deep venous systems from the level of the common femoral vein and including the common femoral, femoral, profunda femoral, popliteal and  calf veins including the posterior tibial, peroneal and gastrocnemius veins when visible. The superficial great saphenous vein was also interrogated. Spectral Doppler  was utilized to evaluate flow at rest and with distal augmentation maneuvers in the common femoral, femoral and popliteal veins. COMPARISON:  None. FINDINGS: RIGHT LOWER EXTREMITY Common Femoral Vein: No evidenc

## 2022-01-09 ENCOUNTER — Encounter
Admission: RE | Disposition: A | Discharge status: Home / Self Care | Payer: Self-pay | Source: Home / Self Care | Attending: Surgery

## 2022-01-09 ENCOUNTER — Ambulatory Visit: Payer: Medicare HMO | Admitting: Urgent Care

## 2022-01-09 ENCOUNTER — Encounter: Payer: Self-pay | Admitting: Surgery

## 2022-01-09 ENCOUNTER — Ambulatory Visit: Admit: 2022-01-09 | Payer: Medicare HMO | Admitting: Surgery

## 2022-01-09 ENCOUNTER — Ambulatory Visit: Payer: Medicare HMO | Admitting: Certified Registered"

## 2022-01-09 ENCOUNTER — Ambulatory Visit
Admission: RE | Admit: 2022-01-09 | Discharge: 2022-01-09 | Disposition: A | Discharge status: Home / Self Care | Payer: Medicare HMO | Attending: Surgery | Admitting: Surgery

## 2022-01-09 ENCOUNTER — Other Ambulatory Visit: Payer: Self-pay

## 2022-01-09 DIAGNOSIS — E785 Hyperlipidemia, unspecified: Secondary | ICD-10-CM | POA: Diagnosis not present

## 2022-01-09 DIAGNOSIS — K819 Cholecystitis, unspecified: Secondary | ICD-10-CM | POA: Diagnosis not present

## 2022-01-09 DIAGNOSIS — I130 Hypertensive heart and chronic kidney disease with heart failure and stage 1 through stage 4 chronic kidney disease, or unspecified chronic kidney disease: Secondary | ICD-10-CM | POA: Insufficient documentation

## 2022-01-09 DIAGNOSIS — K8012 Calculus of gallbladder with acute and chronic cholecystitis without obstruction: Secondary | ICD-10-CM | POA: Insufficient documentation

## 2022-01-09 DIAGNOSIS — I10 Essential (primary) hypertension: Secondary | ICD-10-CM | POA: Diagnosis not present

## 2022-01-09 DIAGNOSIS — N189 Chronic kidney disease, unspecified: Secondary | ICD-10-CM | POA: Insufficient documentation

## 2022-01-09 DIAGNOSIS — I4891 Unspecified atrial fibrillation: Secondary | ICD-10-CM | POA: Insufficient documentation

## 2022-01-09 DIAGNOSIS — E1122 Type 2 diabetes mellitus with diabetic chronic kidney disease: Secondary | ICD-10-CM | POA: Insufficient documentation

## 2022-01-09 DIAGNOSIS — I509 Heart failure, unspecified: Secondary | ICD-10-CM | POA: Insufficient documentation

## 2022-01-09 DIAGNOSIS — K8010 Calculus of gallbladder with chronic cholecystitis without obstruction: Secondary | ICD-10-CM | POA: Diagnosis not present

## 2022-01-09 DIAGNOSIS — Z8719 Personal history of other diseases of the digestive system: Secondary | ICD-10-CM

## 2022-01-09 DIAGNOSIS — Z87891 Personal history of nicotine dependence: Secondary | ICD-10-CM | POA: Insufficient documentation

## 2022-01-09 DIAGNOSIS — K219 Gastro-esophageal reflux disease without esophagitis: Secondary | ICD-10-CM | POA: Insufficient documentation

## 2022-01-09 DIAGNOSIS — K810 Acute cholecystitis: Secondary | ICD-10-CM | POA: Diagnosis present

## 2022-01-09 LAB — GLUCOSE, CAPILLARY
Glucose-Capillary: 163 mg/dL — ABNORMAL HIGH (ref 70–99)
Glucose-Capillary: 209 mg/dL — ABNORMAL HIGH (ref 70–99)

## 2022-01-09 SURGERY — CHOLECYSTECTOMY, ROBOT-ASSISTED, LAPAROSCOPIC
Anesthesia: General

## 2022-01-09 MED ORDER — 0.9 % SODIUM CHLORIDE (POUR BTL) OPTIME
TOPICAL | Status: DC | PRN
  Administered 2022-01-09: 100 mL

## 2022-01-09 MED ORDER — INSULIN ASPART 100 UNIT/ML IJ SOLN
INTRAMUSCULAR | Status: AC
Start: 2022-01-09 — End: 2022-01-09
  Filled 2022-01-09: qty 1

## 2022-01-09 MED ORDER — ORAL CARE MOUTH RINSE: 15.0000 mL | Freq: Once | OROMUCOSAL | Status: AC

## 2022-01-09 MED ORDER — INSULIN ASPART 100 UNIT/ML IJ SOLN
3.0000 [IU] | Freq: Once | INTRAMUSCULAR | Status: AC
  Administered 2022-01-09: 3 [IU] via SUBCUTANEOUS

## 2022-01-09 MED ORDER — FENTANYL CITRATE (PF) 100 MCG/2ML IJ SOLN
INTRAMUSCULAR | Status: AC
  Filled 2022-01-09: qty 2

## 2022-01-09 MED ORDER — CEFAZOLIN SODIUM-DEXTROSE 2-4 GM/100ML-% IV SOLN
2.0000 g | INTRAVENOUS | Status: AC
  Administered 2022-01-09: 2 g via INTRAVENOUS

## 2022-01-09 MED ORDER — OXYCODONE HCL 5 MG PO TABS
5.0000 mg | ORAL_TABLET | Freq: Once | ORAL | Status: AC
  Administered 2022-01-09: 5 mg via ORAL

## 2022-01-09 MED ORDER — INDOCYANINE GREEN 25 MG IV SOLR
2.5000 mg | INTRAVENOUS | Status: AC
  Administered 2022-01-09: 2.5 mg via INTRAVENOUS
  Filled 2022-01-09: qty 1

## 2022-01-09 MED ORDER — LACTATED RINGERS IV SOLN: INTRAVENOUS | Status: DC | PRN

## 2022-01-09 MED ORDER — ROCURONIUM BROMIDE 100 MG/10ML IV SOLN
INTRAVENOUS | Status: DC | PRN
Start: 2022-01-09 — End: 2022-01-09
  Administered 2022-01-09: 20 mg via INTRAVENOUS
  Administered 2022-01-09: 10 mg via INTRAVENOUS
  Administered 2022-01-09: 50 mg via INTRAVENOUS

## 2022-01-09 MED ORDER — ACETAMINOPHEN 500 MG PO TABS: 1000.0000 mg | ORAL_TABLET | Freq: Four times a day (QID) | ORAL | Status: DC | PRN

## 2022-01-09 MED ORDER — SODIUM CHLORIDE 0.9 % IV SOLN: INTRAVENOUS | Status: DC

## 2022-01-09 MED ORDER — ACETAMINOPHEN 500 MG PO TABS
1000.0000 mg | ORAL_TABLET | ORAL | Status: AC
  Administered 2022-01-09: 1000 mg via ORAL

## 2022-01-09 MED ORDER — CEFAZOLIN SODIUM-DEXTROSE 2-4 GM/100ML-% IV SOLN
INTRAVENOUS | Status: AC
  Filled 2022-01-09: qty 100

## 2022-01-09 MED ORDER — LIDOCAINE HCL (CARDIAC) PF 100 MG/5ML IV SOSY
PREFILLED_SYRINGE | INTRAVENOUS | Status: DC | PRN
  Administered 2022-01-09: 60 mg via INTRAVENOUS

## 2022-01-09 MED ORDER — FENTANYL CITRATE (PF) 100 MCG/2ML IJ SOLN
INTRAMUSCULAR | Status: DC | PRN
  Administered 2022-01-09 (×2): 25 ug via INTRAVENOUS

## 2022-01-09 MED ORDER — OXYCODONE HCL 5 MG PO TABS: 5.0000 mg | ORAL_TABLET | ORAL | 0 refills | Status: DC | PRN

## 2022-01-09 MED ORDER — PROPOFOL 10 MG/ML IV BOLUS
INTRAVENOUS | Status: DC | PRN
  Administered 2022-01-09: 70 mg via INTRAVENOUS

## 2022-01-09 MED ORDER — GABAPENTIN 300 MG PO CAPS
ORAL_CAPSULE | ORAL | Status: AC
  Filled 2022-01-09: qty 1

## 2022-01-09 MED ORDER — CHLORHEXIDINE GLUCONATE 0.12 % MT SOLN: 15.0000 mL | Freq: Once | OROMUCOSAL | Status: AC

## 2022-01-09 MED ORDER — OXYCODONE HCL 5 MG PO TABS
ORAL_TABLET | ORAL | Status: AC
  Filled 2022-01-09: qty 1

## 2022-01-09 MED ORDER — BUPIVACAINE-EPINEPHRINE (PF) 0.25% -1:200000 IJ SOLN
INTRAMUSCULAR | Status: DC | PRN
  Administered 2022-01-09: 30 mL via PERINEURAL

## 2022-01-09 MED ORDER — PHENYLEPHRINE HCL-NACL 20-0.9 MG/250ML-% IV SOLN
INTRAVENOUS | Status: AC
  Filled 2022-01-09: qty 250

## 2022-01-09 MED ORDER — BUPIVACAINE-EPINEPHRINE (PF) 0.25% -1:200000 IJ SOLN
INTRAMUSCULAR | Status: AC
  Filled 2022-01-09: qty 30

## 2022-01-09 MED ORDER — DEXMEDETOMIDINE (PRECEDEX) IN NS 20 MCG/5ML (4 MCG/ML) IV SYRINGE
PREFILLED_SYRINGE | INTRAVENOUS | Status: DC | PRN
  Administered 2022-01-09: 20 ug via INTRAVENOUS

## 2022-01-09 MED ORDER — CHLORHEXIDINE GLUCONATE 0.12 % MT SOLN
OROMUCOSAL | Status: AC
  Administered 2022-01-09: 15 mL via OROMUCOSAL
  Filled 2022-01-09: qty 15

## 2022-01-09 MED ORDER — CHLORHEXIDINE GLUCONATE CLOTH 2 % EX PADS
6.0000 | MEDICATED_PAD | Freq: Once | CUTANEOUS | Status: AC
  Administered 2022-01-09: 6 via TOPICAL

## 2022-01-09 MED ORDER — CHLORHEXIDINE GLUCONATE CLOTH 2 % EX PADS: 6.0000 | MEDICATED_PAD | Freq: Once | CUTANEOUS | Status: DC

## 2022-01-09 MED ORDER — FENTANYL CITRATE (PF) 100 MCG/2ML IJ SOLN: 25.0000 ug | INTRAMUSCULAR | Status: DC | PRN

## 2022-01-09 MED ORDER — ONDANSETRON HCL 4 MG/2ML IJ SOLN: 4.0000 mg | Freq: Once | INTRAMUSCULAR | Status: DC | PRN

## 2022-01-09 MED ORDER — SUGAMMADEX SODIUM 200 MG/2ML IV SOLN
INTRAVENOUS | Status: DC | PRN
  Administered 2022-01-09: 200 mg via INTRAVENOUS

## 2022-01-09 MED ORDER — PHENYLEPHRINE HCL-NACL 20-0.9 MG/250ML-% IV SOLN
INTRAVENOUS | Status: DC | PRN
  Administered 2022-01-09: 50 ug/min via INTRAVENOUS

## 2022-01-09 MED ORDER — ONDANSETRON HCL 4 MG/2ML IJ SOLN
INTRAMUSCULAR | Status: DC | PRN
  Administered 2022-01-09: 4 mg via INTRAVENOUS

## 2022-01-09 MED ORDER — GABAPENTIN 300 MG PO CAPS
300.0000 mg | ORAL_CAPSULE | ORAL | Status: AC
  Administered 2022-01-09: 300 mg via ORAL

## 2022-01-09 MED ORDER — PHENYLEPHRINE HCL (PRESSORS) 10 MG/ML IV SOLN
INTRAVENOUS | Status: DC | PRN
  Administered 2022-01-09: 160 ug via INTRAVENOUS

## 2022-01-09 MED ORDER — SODIUM CHLORIDE 0.9 % IR SOLN
Status: DC | PRN
  Administered 2022-01-09: 100 mL

## 2022-01-09 MED ORDER — ACETAMINOPHEN 500 MG PO TABS
ORAL_TABLET | ORAL | Status: AC
  Filled 2022-01-09: qty 2

## 2022-01-09 SURGICAL SUPPLY — 54 items
ADH SKN CLS APL DERMABOND .7 (GAUZE/BANDAGES/DRESSINGS) ×1
BAG INFUSER PRESSURE 100CC (MISCELLANEOUS) ×1 IMPLANT
BAG RETRIEVAL 10 (BASKET) ×1
BNDG ADH 2 X3.75 FABRIC TAN LF (GAUZE/BANDAGES/DRESSINGS) ×1 IMPLANT
BNDG ADH XL 3.75X2 STRCH LF (GAUZE/BANDAGES/DRESSINGS) ×1
CANNULA REDUC XI 12-8 STAPL (CANNULA) ×1
CANNULA REDUCER 12-8 DVNC XI (CANNULA) ×1 IMPLANT
CLIP LIGATING HEMO O LOK GREEN (MISCELLANEOUS) ×2 IMPLANT
DERMABOND ADVANCED (GAUZE/BANDAGES/DRESSINGS) ×1
DERMABOND ADVANCED .7 DNX12 (GAUZE/BANDAGES/DRESSINGS) ×1 IMPLANT
DRAPE ARM DVNC X/XI (DISPOSABLE) ×4 IMPLANT
DRAPE COLUMN DVNC XI (DISPOSABLE) ×1 IMPLANT
DRAPE DA VINCI XI ARM (DISPOSABLE) ×4
DRAPE DA VINCI XI COLUMN (DISPOSABLE) ×1
ELECT CAUTERY BLADE TIP 2.5 (TIP) ×2
ELECT REM PT RETURN 9FT ADLT (ELECTROSURGICAL) ×2
ELECTRODE CAUTERY BLDE TIP 2.5 (TIP) ×1 IMPLANT
ELECTRODE REM PT RTRN 9FT ADLT (ELECTROSURGICAL) ×1 IMPLANT
GLOVE SURG SYN 7.0 (GLOVE) ×6 IMPLANT
GLOVE SURG SYN 7.0 PF PI (GLOVE) ×2 IMPLANT
GLOVE SURG SYN 7.5  E (GLOVE) ×3
GLOVE SURG SYN 7.5 E (GLOVE) ×3 IMPLANT
GLOVE SURG SYN 7.5 PF PI (GLOVE) ×2 IMPLANT
GOWN STRL REUS W/ TWL LRG LVL3 (GOWN DISPOSABLE) ×4 IMPLANT
GOWN STRL REUS W/TWL LRG LVL3 (GOWN DISPOSABLE) ×8
IRRIGATOR SUCT 8 DISP DVNC XI (IRRIGATION / IRRIGATOR) IMPLANT
IRRIGATOR SUCTION 8MM XI DISP (IRRIGATION / IRRIGATOR) ×1
IV NS 1000ML (IV SOLUTION) ×2
IV NS 1000ML BAXH (IV SOLUTION) IMPLANT
KIT PINK PAD W/HEAD ARE REST (MISCELLANEOUS) ×2
KIT PINK PAD W/HEAD ARM REST (MISCELLANEOUS) ×1 IMPLANT
LABEL OR SOLS (LABEL) ×2 IMPLANT
MANIFOLD NEPTUNE II (INSTRUMENTS) ×2 IMPLANT
NEEDLE HYPO 22GX1.5 SAFETY (NEEDLE) ×2 IMPLANT
NS IRRIG 500ML POUR BTL (IV SOLUTION) ×2 IMPLANT
OBTURATOR OPTICAL STANDARD 8MM (TROCAR) ×1
OBTURATOR OPTICAL STND 8 DVNC (TROCAR) ×1
OBTURATOR OPTICALSTD 8 DVNC (TROCAR) ×1 IMPLANT
PACK LAP CHOLECYSTECTOMY (MISCELLANEOUS) ×2 IMPLANT
PENCIL ELECTRO HAND CTR (MISCELLANEOUS) ×2 IMPLANT
SEAL CANN UNIV 5-8 DVNC XI (MISCELLANEOUS) ×3 IMPLANT
SEAL XI 5MM-8MM UNIVERSAL (MISCELLANEOUS) ×3
SET TUBE SMOKE EVAC HIGH FLOW (TUBING) ×2 IMPLANT
SOLUTION ELECTROLUBE (MISCELLANEOUS) ×2 IMPLANT
SPIKE FLUID TRANSFER (MISCELLANEOUS) ×2 IMPLANT
STAPLER CANNULA SEAL DVNC XI (STAPLE) ×1 IMPLANT
STAPLER CANNULA SEAL XI (STAPLE) ×1
SUT MNCRL AB 4-0 PS2 18 (SUTURE) ×3 IMPLANT
SUT VICRYL 0 AB UR-6 (SUTURE) ×3 IMPLANT
SYS BAG RETRIEVAL 10MM (BASKET) ×1
SYSTEM BAG RETRIEVAL 10MM (BASKET) ×1 IMPLANT
TAPE TRANSPORE STRL 2 31045 (GAUZE/BANDAGES/DRESSINGS) ×2 IMPLANT
TROCAR BALLN GELPORT 12X130M (ENDOMECHANICALS) ×2 IMPLANT
WATER STERILE IRR 500ML POUR (IV SOLUTION) ×2 IMPLANT

## 2022-01-09 NOTE — Discharge Instructions (Addendum)
Discharge Instructions: ?1.  Patient may shower, but do not scrub wounds heavily and dab dry only. ?2.  Do not submerge wounds in pool/tub until fully healed. ?3.  Do not apply ointments or hydrogen peroxide to the wounds. ?4.  May apply ice packs to the wounds for comfort. ?5.  Please change BandAid to prior drain site once daily and as needed to keep the area clean/dry.  Once site is healed/closed, do not need further dressings. ?6.  May resume your Eliquis on 01/11/22. ?7.  No heavy lifting or pushing of more than 10-15 lbs for 4 weeks. ?8.  Do not drive while taking narcotics for pain control.  Prior to driving, make sure you are able to rotate right and left to look at blindspots without significant pain or discomfort. ? ? ? ? ?AMBULATORY SURGERY  ?DISCHARGE INSTRUCTIONS ? ? ?The drugs that you were given will stay in your system until tomorrow so for the next 24 hours you should not: ? ?Drive an automobile ?Make any legal decisions ?Drink any alcoholic beverage ? ? ?You may resume regular meals tomorrow.  Today it is better to start with liquids and gradually work up to solid foods. ? ?You may eat anything you prefer, but it is better to start with liquids, then soup and crackers, and gradually work up to solid foods. ? ? ?Please notify your doctor immediately if you have any unusual bleeding, trouble breathing, redness and pain at the surgery site, drainage, fever, or pain not relieved by medication ? ? ? ? ? ?Please contact your physician with any problems or Same Day Surgery at [phone], Monday through Friday 6 am to 4 pm, or [place] at [facility] number at [phone].  ?

## 2022-01-09 NOTE — Interval H&P Note (Signed)
History and Physical Interval Note: ? ?01/09/2022 ?12:38 PM ? ?Amanda [name]  has presented today for surgery, with the diagnosis of cholecystitis.  The various methods of treatment have been discussed with the patient and family. After consideration of risks, benefits and other options for treatment, the patient has consented to  Procedure(s): ?XI ROBOTIC ASSISTED LAPAROSCOPIC CHOLECYSTECTOMY (N/A) ?INDOCYANINE GREEN FLUORESCENCE IMAGING (ICG) (N/A) as a surgical intervention.  The patient's history has been reviewed, patient examined, no change in status, stable for surgery.  I have reviewed the patient's chart and labs.  Questions were answered to the patient's satisfaction.   ? ? ?Amanda [name] ? ? ?

## 2022-01-09 NOTE — Op Note (Signed)
?Procedure Date:  01/09/2022 ? ?Pre-operative Diagnosis:  History of acute cholecystitis ? ?Post-operative Diagnosis: History of acute cholecystitis ? ?Procedure:  Robotic assisted cholecystectomy with ICG FireFly cholangiogram ? ?Surgeon:  Melvyn Neth, MD ? ?Anesthesia:  General endotracheal ? ?Estimated Blood Loss:  10 ml ? ?Specimens:  gallbladder ? ?Complications:  None ? ?Indications for Procedure:  This is a 66 y.o. female with a prior history of acute cholecystitis, s/p percutaneous cholecystostomy drain.  She was recently admitted with a bout of CHF exacerbation, but has done very well and diuresed very well.  Now she's ready for surgery and has been cleared by both medical and cardiology teams.  The benefits, complications, treatment options, and expected outcomes were discussed with the patient. The risks of bleeding, infection, recurrence of symptoms, failure to resolve symptoms, bile duct damage, bile duct leak, retained common bile duct stone, bowel injury, and need for further procedures were all discussed with the patient and she was willing to proceed. ? ?Description of Procedure: ?The patient was correctly identified in the preoperative area and brought into the operating room.  The patient was placed supine with VTE prophylaxis in place.  Appropriate time-outs were performed.  Anesthesia was induced and the patient was intubated.  Appropriate antibiotics were infused.  The patient's drain bag was disconnected, dressing removed, and the coiling stitch for the percutaneous drain was loosened. ? ?The abdomen was prepped and draped in a sterile fashion. An infraumbilical incision was made. A cutdown technique was used to enter the abdominal cavity without injury, and a 12 mm robotic port was inserted.  Pneumoperitoneum was obtained with appropriate opening pressures.  Three 8-mm ports were placed in the mid abdomen at the level of the umbilicus under direct visualization.  The DaVinci platform  was docked, camera targeted, and instruments were placed under direct visualization. ? ?The patient was found to have ascites consistent with her CHF.  The drain had omentum wrapped around it.  This was dissected using cautery and freed off the drain.  The drain was then removed and the tract was cauterized and transected.  This allowed visualization of the gallbladder.  The gallbladder was found to be edematous and full of stones.  The fundus was grasped and retracted cephalad.  Adhesions were lysed bluntly and with electrocautery. The infundibulum was grasped and retracted laterally, exposing the peritoneum overlying the gallbladder.  This was incised with electrocautery and extended on either side of the gallbladder.  FireFly cholangiogram was then obtained, and we were able to clearly identify the cystic duct and common bile duct.  The cystic duct and cystic artery were carefully dissected with combination of cautery and blunt dissection.  FireFly was then again used to confirm appropriate dissection of the cystic duct and again to identify the common bile duct.  Both cystic artery and duct were clipped twice proximally and once distally, cutting in between.  The gallbladder was taken from the gallbladder fossa in a retrograde fashion with electrocautery. The gallbladder was placed in an Endocatch bag. The liver bed was inspected and any bleeding was controlled with electrocautery. The right upper quadrant was then inspected again revealing intact clips, no bleeding, and no ductal injury.  The area was thoroughly irrigated. ? ?The 8 mm ports were removed under direct visualization and the 12 mm port was removed.  The Endocatch bag was brought out via the umbilical incision. The fascial opening was closed using 0 vicryl suture.  Local anesthetic was infused in all  incisions and the incisions were closed with 3-0 Vicryl and 4-0 Monocryl.  The wounds were cleaned and sealed with DermaBond.  The prior drain site was  cleaned and dressed with BandAid. ? ?The patient was emerged from anesthesia and extubated and brought to the recovery room for further management. ? ?The patient tolerated the procedure well and all counts were correct at the end of the case. ? ? ?Melvyn Neth, MD ? ? ?  ?

## 2022-01-09 NOTE — Anesthesia Procedure Notes (Signed)
Procedure Name: Intubation ?Date/Time: 01/09/2022 1:11 PM ?Performed by: Beverely Low, CRNA ?Pre-anesthesia Checklist: Patient identified, Patient being monitored, Timeout performed, Emergency Drugs available and Suction available ?Patient Re-evaluated:Patient Re-evaluated prior to induction ?Oxygen Delivery Method: Circle system utilized ?Preoxygenation: Pre-oxygenation with 100% oxygen ?Induction Type: IV induction ?Ventilation: Mask ventilation without difficulty ?Laryngoscope Size: 3 and McGraph ?Grade View: Grade I ?Tube type: Oral ?Tube size: 7.0 mm ?Number of attempts: 1 ?Airway Equipment and Method: Stylet ?Placement Confirmation: ETT inserted through vocal cords under direct vision, positive ETCO2 and breath sounds checked- equal and bilateral ?Secured at: 21 cm ?Tube secured with: Tape ?Dental Injury: Teeth and Oropharynx as per pre-operative assessment  ? ? ? ? ?

## 2022-01-09 NOTE — Anesthesia Preprocedure Evaluation (Addendum)
Anesthesia Evaluation  ?Patient identified by MRN, date of birth, ID band ?Patient awake ? ? ? ?Reviewed: ?Allergy & Precautions, H&P , NPO status , Patient's Chart, lab work & pertinent test results, reviewed documented beta blocker date and time  ? ?Airway ?Mallampati: II ? ?TM Distance: >3 FB ?Neck ROM: full ? ? ? Dental ? ?(+) Teeth Intact ?  ?Pulmonary ?shortness of breath and with exertion, pneumonia, resolved, former smoker,  ?  ?Pulmonary exam normal ? ? ? ? ? ? ? Cardiovascular ?Exercise Tolerance: Poor ?hypertension, On Medications ?+ angina with exertion +CHF  ?Normal cardiovascular exam ?Rhythm:regular Rate:Normal ? ? ?  ?Neuro/Psych ?Anxiety negative neurological ROS ? negative psych ROS  ? GI/Hepatic ?Neg liver ROS, GERD  Medicated,  ?Endo/Other  ?negative endocrine ROSdiabetes, Poorly Controlled, Type 2 ? Renal/GU ?Renal disease  ?negative genitourinary ?  ?Musculoskeletal ? ? Abdominal ?  ?Peds ? Hematology ? ?(+) Blood dyscrasia, anemia ,   ?Anesthesia Other Findings ?Past Medical History: ?No date: Anxiety ?No date: Arthritis ?No date: Atrial fibrillation (HCC) ?No date: Cholecystitis ?No date: Diabetes mellitus without complication (HCC) ?No date: Dyspnea ?No date: GERD (gastroesophageal reflux disease) ?No date: Hyperlipidemia ?No date: Hypertension ?Past Surgical History: ?2012: COLONOSCOPY ?12/11/2021: IR CHOLANGIOGRAM EXISTING TUBE ?11/02/2021: IR PERC CHOLECYSTOSTOMY ? ? Reproductive/Obstetrics ?negative OB ROS ? ?  ? ? ? ? ? ? ? ? ? ? ? ? ? ?  ?  ? ? ? ? ? ? ? ? ?Anesthesia Physical ?Anesthesia Plan ? ?ASA: 4 ? ?Anesthesia Plan: General ETT and General  ? ?Post-op Pain Management:   ? ?Induction:  ? ?PONV Risk Score and Plan:  ? ?Airway Management Planned:  ? ?Additional Equipment:  ? ?Intra-op Plan:  ? ?Post-operative Plan:  ? ?Informed Consent: I have reviewed the patients History and Physical, chart, labs and discussed the procedure including the risks,  benefits and alternatives for the proposed anesthesia with the patient or authorized representative who has indicated his/her understanding and acceptance.  ? ? ? ?Dental Advisory Given ? ?Plan Discussed with: CRNA ? ?Anesthesia Plan Comments:   ? ? ? ? ? ?Anesthesia Quick Evaluation ? ?

## 2022-01-09 NOTE — Transfer of Care (Signed)
Immediate Anesthesia Transfer of Care Note ? ?Patient: Amanda [name] ? ?Procedure(s) Performed: XI ROBOTIC ASSISTED LAPAROSCOPIC CHOLECYSTECTOMY ?INDOCYANINE GREEN FLUORESCENCE IMAGING (ICG) ? ?Patient Location: PACU ? ?Anesthesia Type:General ? ?Level of Consciousness: drowsy ? ?Airway & Oxygen Therapy: Patient Spontanous Breathing and Patient connected to face mask oxygen ? ?Post-op Assessment: Report given to RN and Post -op Vital signs reviewed and stable ? ?Post vital signs: Reviewed and stable ? ?Last Vitals:  ?Vitals Value Taken Time  ?BP    ?Temp    ?Pulse    ?Resp    ?SpO2    ? ? ?Last Pain:  ?Vitals:  ? 01/09/22 1227  ?TempSrc: Temporal  ?PainSc: 0-No pain  ?   ? ?  ? ?Complications: No notable events documented. ?

## 2022-01-10 ENCOUNTER — Telehealth: Payer: Self-pay | Admitting: *Deleted

## 2022-01-10 NOTE — Telephone Encounter (Signed)
Transition Care Management Unsuccessful Follow-up Telephone Call ? ?Date of discharge and from where:  [facility] 01-08-2022 ? ?Attempts:  1st Attempt ? ?Reason for unsuccessful TCM follow-up call:  Unable to leave message   voice mail full ? ? ?  ?

## 2022-01-10 NOTE — Telephone Encounter (Signed)
Transition Care Management Follow-up Telephone Call  ?Date of discharge and from where: [facility] 3-21-203 ?How have you been since you were released from the hospital? Doing well ?Any questions or concerns? Yes ? ?Items Reviewed: ?Did the pt receive and understand the discharge instructions provided? Yes  ?Medications obtained and verified? Yes  ?Other? No  ?Any new allergies since your discharge? --No  ?Dietary orders reviewed? -No ?Do you have support at home? Yes  ? ?Home Care and Equipment/Supplies: ?Were home health services ordered? no ?If so, what is the name of the agency?   ?Has the agency set up a time to come to the patient's home? not applicable ?Were any new equipment or medical supplies ordered?  No ?What is the name of the medical supply agency?  ?Were you able to get the supplies/equipment? not applicable ?Do you have any questions related to the use of the equipment or supplies? No ? ?Functional Questionnaire: (I = Independent and D = Dependent) ?ADLs: I ? ?Bathing/Dressing- I ? ?Meal Prep- I ? ?Eating- I ? ?Maintaining continence- I ? ?Transferring/Ambulation- I ? ?Managing Meds- I ? ?Follow up appointments reviewed: ? ?PCP Hospital f/u appt confirmed? no as needed ?Specialist Hospital f/u appt confirmed? Yes  Scheduled to see hackney on 01-16-2022 @ 12:30. ?Manus Rudd 01-22-2022 @ 2:00 ?Are transportation arrangements needed? No  ?If their condition worsens, is the pt aware to call PCP or go to the Emergency Dept.? Yes ?Was the patient provided with contact information for the PCP's office or ED? Yes ?Was to pt encouraged to call back with questions or concerns? Yes  ?

## 2022-01-11 LAB — SURGICAL PATHOLOGY

## 2022-01-12 DIAGNOSIS — K219 Gastro-esophageal reflux disease without esophagitis: Secondary | ICD-10-CM | POA: Diagnosis not present

## 2022-01-12 DIAGNOSIS — I480 Paroxysmal atrial fibrillation: Secondary | ICD-10-CM | POA: Diagnosis not present

## 2022-01-12 DIAGNOSIS — Z48815 Encounter for surgical aftercare following surgery on the digestive system: Secondary | ICD-10-CM | POA: Diagnosis not present

## 2022-01-12 DIAGNOSIS — F419 Anxiety disorder, unspecified: Secondary | ICD-10-CM | POA: Diagnosis not present

## 2022-01-12 DIAGNOSIS — J9601 Acute respiratory failure with hypoxia: Secondary | ICD-10-CM | POA: Diagnosis not present

## 2022-01-12 DIAGNOSIS — E669 Obesity, unspecified: Secondary | ICD-10-CM | POA: Diagnosis not present

## 2022-01-12 DIAGNOSIS — E1122 Type 2 diabetes mellitus with diabetic chronic kidney disease: Secondary | ICD-10-CM | POA: Diagnosis not present

## 2022-01-12 DIAGNOSIS — Z7901 Long term (current) use of anticoagulants: Secondary | ICD-10-CM | POA: Diagnosis not present

## 2022-01-12 DIAGNOSIS — Z8701 Personal history of pneumonia (recurrent): Secondary | ICD-10-CM | POA: Diagnosis not present

## 2022-01-12 DIAGNOSIS — I5033 Acute on chronic diastolic (congestive) heart failure: Secondary | ICD-10-CM | POA: Diagnosis not present

## 2022-01-12 DIAGNOSIS — Z9049 Acquired absence of other specified parts of digestive tract: Secondary | ICD-10-CM | POA: Diagnosis not present

## 2022-01-12 DIAGNOSIS — Z6834 Body mass index (BMI) 34.0-34.9, adult: Secondary | ICD-10-CM | POA: Diagnosis not present

## 2022-01-12 DIAGNOSIS — E876 Hypokalemia: Secondary | ICD-10-CM | POA: Diagnosis not present

## 2022-01-12 DIAGNOSIS — D631 Anemia in chronic kidney disease: Secondary | ICD-10-CM | POA: Diagnosis not present

## 2022-01-12 DIAGNOSIS — E785 Hyperlipidemia, unspecified: Secondary | ICD-10-CM | POA: Diagnosis not present

## 2022-01-12 DIAGNOSIS — I130 Hypertensive heart and chronic kidney disease with heart failure and stage 1 through stage 4 chronic kidney disease, or unspecified chronic kidney disease: Secondary | ICD-10-CM | POA: Diagnosis not present

## 2022-01-12 DIAGNOSIS — I161 Hypertensive emergency: Secondary | ICD-10-CM | POA: Diagnosis not present

## 2022-01-12 DIAGNOSIS — N1831 Chronic kidney disease, stage 3a: Secondary | ICD-10-CM | POA: Diagnosis not present

## 2022-01-12 DIAGNOSIS — Z7985 Long-term (current) use of injectable non-insulin antidiabetic drugs: Secondary | ICD-10-CM | POA: Diagnosis not present

## 2022-01-12 DIAGNOSIS — M1990 Unspecified osteoarthritis, unspecified site: Secondary | ICD-10-CM | POA: Diagnosis not present

## 2022-01-12 DIAGNOSIS — Z9359 Other cystostomy status: Secondary | ICD-10-CM | POA: Diagnosis not present

## 2022-01-14 ENCOUNTER — Ambulatory Visit (INDEPENDENT_AMBULATORY_CARE_PROVIDER_SITE_OTHER): Payer: Medicare HMO

## 2022-01-14 DIAGNOSIS — I5033 Acute on chronic diastolic (congestive) heart failure: Secondary | ICD-10-CM

## 2022-01-14 DIAGNOSIS — E1165 Type 2 diabetes mellitus with hyperglycemia: Secondary | ICD-10-CM

## 2022-01-14 DIAGNOSIS — E1169 Type 2 diabetes mellitus with other specified complication: Secondary | ICD-10-CM

## 2022-01-14 NOTE — Patient Instructions (Signed)
Amanda [name], ? ?Thank you for talking with me today. I have included our care plan/goals in the following pages.  ? ?Please review and call me at [phone] with any questions. ? ?Thanks! ?Edison Nasuti  ? ?Madelin Rear, PharmD ?Clinical Pharmacist  ?[phone] ? ?Care Plan : [place]  ?Updates made by Madelin Rear, [facility] since 01/14/2022 12:00 AM  ?  ? ?Problem: Diabetes, HTN, CKD, HLD, GERD, Obesity, CHF   ?Priority: High  ?  ? ?Long-Range Goal: Disease Management   ?Start Date: 01/14/2022  ?Recent Progress: Not on track  ?Priority: High  ?Note:   ? ?Current Barriers:  ?Unable to maintain control of CHF HLD HTN DM ? ?Pharmacist Clinical Goal(s):  ?Patient will verbalize ability to afford treatment regimen through collaboration with PharmD and provider.  ? ?Interventions: ?1:1 collaboration with Charlynne Cousins, MD regarding development and update of comprehensive plan of care as evidenced by provider attestation and co-signature ?Inter-disciplinary care team collaboration (see longitudinal plan of care) ?Comprehensive medication review performed; medication list updated in electronic medical record ? ?Diabetes (A1c goal <7%) ?-Not ideally controlled ?AKI scr 2.89 10/2021 hosp -> dropped to 0.79. this was secondary to sepsis. started back on trulicity. Synjardy stopped in the acute setting - aki, lactic acidosis (hypoxic). Can afford trulicity copay at this time, will let us know if cost becomes an issue. Scr improved and stable. Could consider restart on sglt2 as appropriate. With history of lactic acidosis, would avoid metformin regardless of precipitating factors. Recommend jardiance 10 mg - next pcp visit 4/4. ?-GFRs >60. Most recent a1c 7 11/22/21 ?-s/p cholecystectomy 12/2021 ?-Current medications: ?Trulicity 3mg  weekly ?-Medications previously tried: glimepiride, januvia, onglyza. Most recently stopped synjardy - reports being stopped at hospital stay  ?-Current home glucose readings ?fasting glucose: did not  test today, normally. 191, 218, 176, 154. ?post prandial glucose: not checking ?-Denies hypoglycemic/hyperglycemic symptoms ?-Current meal patterns: watching carbs, not adding salt. Just had gallbladder removed ?-Current exercise: n/a ?-Educated on A1c and blood sugar goals; ?Benefits of routine self-monitoring of blood sugar; ?-Counseled to check feet daily and get yearly eye exams ?-Counseled on diet and exercise extensively ?Recommended to continue current medication ?Recommend jaridance 10 mg start.  ? ?Hyperlipidemia: (LDL goal < 70) ?-Controlled ?-HTN, DM. ASCVD 10 yr risk ~10%.  ?-Mild TG elevations.  ?-LDL controlled in 60s 08/2021 ?-Current treatment: ?lovastatin stopped 10/2021 hosp. Not currently taking any.  ?-Medications previously tried: n/a- had been on 80 mg daily of lovastatin.  ?-Current dietary patterns: see dm ?-Current exercise habits: see dm ?-Educated on Cholesterol goals;  ?Benefits of statin for ASCVD risk reduction; ?-recommend statin restart w/ crestor 20 mg ? ?Heart Failure (Goal: manage symptoms and prevent exacerbations) ?-Not ideally controlled ?-Afib recently noted in setting of septic shock - AC with eliquis 5 mg BID + cardizem cd 180 mg daily ?-Has had lower K+, most recent 3.8 in hosp.  ?-some swelling in stomach and legs - feels this is getting better slowly. Just got scale yesterday evening. Plans to start daily monitoring tomorrow. [facility] 01/16/22 as new pt. ?-Last ejection fraction: 50-55% (Date: 10/2021). Last BNP 538 01/04/22 in hosp.  ?-HF type: Diastolic ?-NYHA Class: II (slight limitation of activity) ?-AHA HF Stage: B (Heart disease present - no symptoms present) ?-Current treatment: ?Coreg 12.5 mg BID ?Furosemide 40 mg daily (previously 80mg  daily)  ?Take potassium 20 meq with lasix ?-Medications previously tried: previously on ramipril, also stopped 10/2021 - consider restart as  tolerated.   ?-Current home BP/HR readings: SBP <130 ?-Current dietary habits: low carb  low salt ?-Current exercise habits: n/a ?-Educated on Importance of weighing daily; if you gain more than 3 pounds in one day or 5 pounds in one week, contact provider or utilize action plan per cardiology ?-Counseled on diet and exercise extensively ?Recommended to continue current medication ? ?Patient Goals/Self-Care Activities ?Patient will:  ?- take medications as prescribed as evidenced by patient report and record review ? ?Medication Assistance: None required.  Patient affirms current coverage meets needs. ?  ? ?The patient verbalized understanding of instructions provided today and agreed to receive a MyChart copy of patient instruction and/or educational materials. ?Telephone follow up appointment with pharmacy team member scheduled for: See next appointment with "Care Management Staff" under "What's Next" below.   ?

## 2022-01-14 NOTE — Progress Notes (Signed)
? ?Chronic Care Management ?Pharmacy Note ? ?01/14/2022 ?Name:  Amanda [name] MRN:  [id number] DOB:  [birth year] ? ?Summary: ?BP this am 123/73 ?01/14/22 tel appt. Is not taking synjardy, has started back on trulicity. Home FBG 191, 218, 176, 154.  ?Scr improved and stable. Could consider restart on sglt2 as appropriate. With history of lactic acidosis, would avoid metformin (avoided regardless of precipitating factors to lactic acidosis). Recommend jardiance 10 mg - next pcp visit 01/22/22. ?Denies using lovastatin - was also stopped at hospital d/c 10/2021. Would recommend restarting statin. Some potential for interaction b/w lovastatin-cardizem - increases lovastatin dose. consider rosuvastatin 20 mg once daily as appropriate  ?HF clinic visit 01/16/22 ? ?Subjective: ?Amanda [name] is an 66 y.o. year old female who is a primary patient of Vigg, [name], MD.  The CCM team was consulted for assistance with disease management and care coordination needs.   ? ?Engaged with patient by telephone for follow up visit in response to provider referral for pharmacy case management and/or care coordination services.  ? ?Consent to Services:  ?The patient was given information about Chronic Care Management services, agreed to services, and gave verbal consent prior to initiation of services.  Please see initial visit note for detailed documentation.  ? ?Patient Care Team: ?Charlynne Cousins, MD as PCP - General ?Madelin Rear, [facility] as Pharmacist (General Practice) ? ?Hospital visits: ?None in previous 6 months ? ?Objective: ? ?Lab Results  ?Component Value Date  ? CREATININE 0.95 01/08/2022  ? CREATININE 0.95 01/07/2022  ? CREATININE 0.92 01/06/2022  ? ? ?Lab Results  ?Component Value Date  ? HGBA1C 7.0 (H) 11/22/2021  ? HGBA1C 6.9 (H) 11/05/2021  ? HGBA1C 6.7 (H) 08/30/2021  ? ?Last diabetic Eye exam: No results found for: HMDIABEYEEXA  ?Last diabetic Foot exam: No results found for: HMDIABFOOTEX  ? ?   ?Component Value Date/Time  ? CHOL 142  08/30/2021 0822  ? CHOL 148 04/11/2021 0807  ? CHOL 154 12/25/2020 1512  ? CHOL 196 07/16/2017 1422  ? CHOL 148 09/16/2016 1558  ? CHOL 178 12/07/2015 1356  ? TRIG 163 (H) 08/30/2021 1478  ? TRIG 139 04/11/2021 0807  ? TRIG 176 (H) 12/25/2020 1512  ? TRIG 301 (H) 07/16/2017 1422  ? TRIG 199 (H) 09/16/2016 1558  ? TRIG 232 (H) 12/07/2015 1356  ? HDL 50 08/30/2021 0822  ? HDL 54 04/11/2021 0807  ? HDL 53 12/25/2020 1512  ? CHOLHDL 2.8 08/30/2021 2956  ? VLDL 60 (H) 07/16/2017 1422  ? [place] 64 08/30/2021 0822  ? [place] 70 04/11/2021 0807  ? [place] 71 12/25/2020 1512  ? ? ? ?  Latest Ref Rng & Units 01/04/2022  ?  6:31 PM 11/22/2021  ?  3:51 PM 11/06/2021  ?  5:22 AM  ?Hepatic Function  ?Total Protein 6.5 - 8.1 g/dL 7.8   5.9   5.6    ?Albumin 3.5 - 5.0 g/dL 3.3   3.0   2.9    ?AST 15 - 41 U/L 22   19   36    ?ALT 0 - 44 U/L 12   16   33    ?Alk Phosphatase 38 - 126 U/L 83   110   85    ?Total Bilirubin 0.3 - 1.2 mg/dL 0.7   0.4   1.1    ? ? ?Lab Results  ?Component Value Date/Time  ? TSH 3.901 01/04/2022 06:31 PM  ? TSH 1.640 08/30/2021 08:22 AM  ?  TSH 2.130 09/05/2020 04:15 PM  ? ? ? ?  Latest Ref Rng & Units 01/08/2022  ?  4:29 AM 01/07/2022  ?  6:44 AM 01/06/2022  ?  5:01 AM  ?CBC  ?WBC 4.0 - 10.5 K/uL 6.6   6.7   6.4    ?Hemoglobin 12.0 - 15.0 g/dL 8.8   9.7   9.6    ?Hematocrit 36.0 - 46.0 % 29.1   32.1   32.6    ?Platelets 150 - 400 K/uL 209   223   222    ? ? ?No results found for: VD25OH ? ?Clinical ASCVD:  ?The 10-year ASCVD risk score (Arnett DK, et al., 2019) is: 9.9% ?  Values used to calculate the score: ?    Age: 66 years ?    Sex: Female ?    Is Non-Hispanic African American: No ?    Diabetic: Yes ?    Tobacco smoker: No ?    Systolic Blood Pressure: 413 mmHg ?    Is BP treated: Yes ?    HDL Cholesterol: 50 mg/dL ?    Total Cholesterol: 142 mg/dL   ?Social History  ? ?Tobacco Use  ?Smoking Status Former  ? Types: Cigarettes  ? Quit date: 12/07/1975  ? Years since quitting: 46.1  ?Smokeless Tobacco Never   ? ?BP Readings from Last 3 Encounters:  ?01/09/22 (!) 108/58  ?01/08/22 109/67  ?12/26/21 (!) 148/91  ? ?Pulse Readings from Last 3 Encounters:  ?01/09/22 71  ?01/08/22 94  ?12/26/21 (!) 108  ? ?Wt Readings from Last 3 Encounters:  ?01/09/22 178 lb (80.7 kg)  ?01/08/22 178 lb 5.6 oz (80.9 kg)  ?12/26/21 192 lb (87.1 kg)  ? ?BMI Readings from Last 3 Encounters:  ?01/09/22 33.63 kg/m?  ?01/08/22 33.70 kg/m?  ?12/26/21 36.28 kg/m?  ? ? ?Assessment: Review of patient past medical history, allergies, medications, health status, including review of consultants reports, laboratory and other test data, was performed as part of comprehensive evaluation and provision of chronic care management services.  ? ?SDOH:  (Social Determinants of Health) assessments and interventions performed: Yes ? ? ?[place] ? ?Allergies  ?Allergen Reactions  ? Tramadol Other (See Comments)  ?  "woozy"  ? Penicillins Other (See Comments)  ?  Dizziness ?Tolerated augmentin but does not like taste  ? ? ?Medications Reviewed Today   ? ? Reviewed by Madelin Rear, [facility] (Pharmacist) on 01/14/22 at 1214  Med List Status: <None>  ? ?Medication Order Taking? Sig Documenting Provider Last Dose Status Informant  ?acetaminophen (TYLENOL) 500 MG tablet [id number] Yes Take 2 tablets (1,000 mg total) by mouth every 6 (six) hours as needed for mild pain. Olean Ree, MD Taking Active   ?albuterol (VENTOLIN HFA) 108 (90 Base) MCG/ACT inhaler [id number]  Inhale 2 puffs into the lungs every 6 (six) hours as needed for wheezing or shortness of breath. Vigg, [name], MD  Active Self  ?apixaban (ELIQUIS) 5 MG TABS tablet [id number] Yes Take 5 mg by mouth 2 (two) times daily. [provider] Taking Active   ?benzonatate (TESSALON) 100 MG capsule [id number] No Take 1 capsule (100 mg total) by mouth 2 (two) times daily as needed for cough.  ?Patient not taking: Reported on 01/14/2022  ? Charlynne Cousins, MD Not Taking Active Self  ?Calcium Carb-Cholecalciferol  (CALCIUM 600+D3) 600-200 MG-UNIT TABS [id number] Yes Take 1 tablet by mouth daily. [provider] Taking Active Self  ?carvedilol (COREG) 12.5 MG tablet [id number] Yes Take  1 tablet (12.5 mg total) by mouth 2 (two) times daily with a meal. Wynetta Emery, Megan P, DO Taking Active Self  ?diltiazem (CARDIZEM CD) 180 MG 24 hr capsule [id number] Yes Take 1 capsule (180 mg total) by mouth daily. Valerie Roys, DO Taking Active Self  ?Dulaglutide (TRULICITY) 3 XY/7.2WV SOPN [id number]  Inject 3 mg as directed once a week. Charlynne Cousins, MD  Active Self  ?ferrous sulfate 325 (65 FE) MG tablet [id number] Yes Take 1 tablet (325 mg total) by mouth daily. Bonnielee Haff, MD Taking Active   ?fexofenadine [facility] ALLERGY) 180 MG tablet [id number] No Take 1 tablet (180 mg total) by mouth daily.  ?Patient not taking: Reported on 01/14/2022  ? Charlynne Cousins, MD Not Taking Active Self  ?furosemide (LASIX) 40 MG tablet [id number] Yes Take 98m twice daily for 4 days, then take 453monce daily. KrBonnielee HaffMD Taking Active   ?montelukast (SINGULAIR) 10 MG tablet [id number] Take 1 tablet (10 mg total) by mouth at bedtime.  ?Patient not taking: Reported on 01/14/2022  ? ViCharlynne CousinsMD Not Taking Active Self  ?Multiple Vitamin (MULTIVITAMIN) tablet [id number] Take 1 tablet by mouth daily. [provider] Taking Active Self  ?omeprazole (PRILOSEC) 20 MG capsule [id number] Take 20 mg by mouth daily. [provider] Taking Active Self  ?oxyCODONE (OXY IR/ROXICODONE) 5 MG immediate release tablet [id number] 1 tablet (5 mg total) by mouth every 4 (four) hours as needed for severe pain. PiOlean ReeMD  Active   ?potassium chloride SA (KLOR-CON M) 20 MEQ tablet [id number] Take 1 tablet (20 mEq total) by mouth daily. KrBonnielee HaffMD Taking Active   ?traZODone (DESYREL) 50 MG tablet [id number] Take 1 tablet (50 mg total) by mouth at bedtime. WiLoletha GrayerMD Taking Active Self  ?vitamin B-12  (CYANOCOBALAMIN) 500 MCG tablet [id number] 500 mcg by mouth daily. [provider]  Active Self  ? ?  ?  ? ?  ? ? ?Patient Active Problem List  ? Diagnosis Date Noted  ? History of cholecystitis   ?

## 2022-01-15 ENCOUNTER — Telehealth: Payer: Self-pay | Admitting: Internal Medicine

## 2022-01-15 NOTE — Telephone Encounter (Signed)
Called and gave verbal orders per Dr. Neomia Dear.  ?

## 2022-01-15 NOTE — Progress Notes (Signed)
? Patient ID: Amanda [name], female    DOB: 1956-03-30, 66 y.o.   MRN: [id number] ? ?HPI ? ?Ms Villaverde is a 66 y/o female with a history of DM, hyperlipidemia, HTN, anxiety, atrial fibrillation, GERD, previous tobacco use and chronic heart failure.  ? ?Echo report from 01/05/22 reviewed and showed an EF of 50-55% along with moderate LAE & moderate MR.  ? ?Admitted 01/04/22 due to shortness of breath. CT scan showed pleural effusions. Cardiology and surgical consults obtained. Initially given IV lasix with subsequent diuresis of ~ 10L with transition to oral diuretics. Initially needed 4L oxygen but able to be weaned off to room air. Chest CTA showed multiple pulmonary nodules. OT consult obtained. Discharged after 4 days with plan to return the next day for cholecystectomy.  ? ?She presents today for her initial visit with a chief complaint of minimal fatigue upon moderate exertion. Describes this as chronic in nature. She has associated pedal edema (improving) and abdominal distention (improving) along with this. She denies any difficulty sleeping, dizziness, palpitations, chest pain, shortness of breath, cough or weight gain.  ? ?Says that she was able to sleep in the bed last night and prior to her admission, she was having to sleep in the recliner due to shortness of breath. Weighing daily. Adds salt to her eggs but says that is the only thing she has been adding salt to.  ? ?Has compression socks at home and has worn them in the past but not recently because she didn't think they were helping.  ? ?Past Medical History:  ?Diagnosis Date  ? Anxiety   ? Arthritis   ? Atrial fibrillation ([place])   ? CHF (congestive heart failure) ([place])   ? Cholecystitis   ? Diabetes mellitus without complication ([place])   ? Dyspnea   ? GERD (gastroesophageal reflux disease)   ? Hyperlipidemia   ? Hypertension   ? ?Past Surgical History:  ?Procedure Laterality Date  ? COLONOSCOPY  2012  ? IR CHOLANGIOGRAM EXISTING TUBE  12/11/2021  ? IR PERC  CHOLECYSTOSTOMY  11/02/2021  ? ?Family History  ?Problem Relation Age of Onset  ? COPD Mother   ? Hypertension Mother   ? Diabetes Mother   ? Severe combined immunodeficiency Father   ? Diabetes Sister   ? Heart disease Sister   ? Heart disease Sister   ? Cancer Niece   ? ?Social History  ? ?Tobacco Use  ? Smoking status: Former  ?  Types: Cigarettes  ?  Quit date: 12/07/1975  ?  Years since quitting: 46.1  ? Smokeless tobacco: Never  ?Substance Use Topics  ? Alcohol use: No  ? ?Allergies  ?Allergen Reactions  ? Tramadol Other (See Comments)  ?  "woozy"  ? Penicillins Other (See Comments)  ?  Dizziness ?Tolerated augmentin but does not like taste  ? ?Prior to Admission medications   ?Medication Sig Start Date End Date Taking? Authorizing Provider  ?acetaminophen (TYLENOL) 500 MG tablet Take 2 tablets (1,000 mg total) by mouth every 6 (six) hours as needed for mild pain. 01/09/22  Yes Piscoya, Jacqulyn Bath, MD  ?albuterol (VENTOLIN HFA) 108 (90 Base) MCG/ACT inhaler Inhale 2 puffs into the lungs every 6 (six) hours as needed for wheezing or shortness of breath. 12/27/21  Yes Vigg, [name], MD  ?apixaban (ELIQUIS) 5 MG TABS tablet Take 5 mg by mouth 2 (two) times daily.   Yes [provider]  ?benzonatate (TESSALON) 100 MG capsule Take 1 capsule (100 mg  total) by mouth 2 (two) times daily as needed for cough. 12/27/21  Yes Vigg, [name], MD  ?Calcium Carb-Cholecalciferol (CALCIUM 600+D3) 600-200 MG-UNIT TABS Take 1 tablet by mouth daily.   Yes [provider]  ?carvedilol (COREG) 12.5 MG tablet Take 1 tablet (12.5 mg total) by mouth 2 (two) times daily with a meal. 12/06/21  Yes Johnson, Megan P, DO  ?diltiazem (CARDIZEM CD) 180 MG 24 hr capsule Take 1 capsule (180 mg total) by mouth daily. 12/06/21  Yes Johnson, Megan P, DO  ?Dulaglutide (TRULICITY) 3 0000000 SOPN Inject 3 mg as directed once a week. 12/24/21 03/24/22 Yes Vigg, [name], MD  ?ferrous sulfate 325 (65 FE) MG tablet Take 1 tablet (325 mg total) by mouth  daily. 01/08/22 05/08/22 Yes Bonnielee Haff, MD  ?furosemide (LASIX) 40 MG tablet Take 40mg  twice daily for 4 days, then take 40mg  once daily. 01/08/22  Yes Bonnielee Haff, MD  ?Multiple Vitamin (MULTIVITAMIN) tablet Take 1 tablet by mouth daily.   Yes [provider]  ?omeprazole (PRILOSEC) 20 MG capsule Take 20 mg by mouth daily.   Yes [provider]  ?oxyCODONE (OXY IR/ROXICODONE) 5 MG immediate release tablet Take 1 tablet (5 mg total) by mouth every 4 (four) hours as needed for severe pain. 01/09/22  Yes Piscoya, Jacqulyn Bath, MD  ?potassium chloride SA (KLOR-CON M) 20 MEQ tablet Take 1 tablet (20 mEq total) by mouth daily. 01/08/22  Yes Bonnielee Haff, MD  ?traZODone (DESYREL) 50 MG tablet Take 1 tablet (50 mg total) by mouth at bedtime. 11/08/21  Yes Wieting, Richard, MD  ?vitamin B-12 (CYANOCOBALAMIN) 500 MCG tablet Take 500 mcg by mouth daily.   Yes [provider]  ? ? ?Review of Systems  ?Constitutional:  Positive for fatigue. Negative for appetite change.  ?HENT:  Negative for congestion, postnasal drip and sore throat.   ?Eyes: Negative.   ?Respiratory:  Negative for cough, chest tightness and shortness of breath.   ?Cardiovascular:  Positive for leg swelling (improving). Negative for chest pain and palpitations.  ?Gastrointestinal:  Positive for abdominal distention (improving). Negative for abdominal pain.  ?Endocrine: Negative.   ?Genitourinary: Negative.   ?Musculoskeletal:  Negative for back pain and neck pain.  ?Skin: Negative.   ?Allergic/Immunologic: Negative.   ?Neurological:  Negative for dizziness and light-headedness.  ?Hematological:  Negative for adenopathy. Does not bruise/bleed easily.  ?Psychiatric/Behavioral:  Negative for dysphoric mood and sleep disturbance (sleeping on 2 pillows). The patient is not nervous/anxious.   ? ?Vitals:  ? 01/16/22 1214  ?BP: (!) 121/57  ?Pulse: 80  ?Resp: 12  ?SpO2: 100%  ?Weight: 175 lb 6 oz (79.5 kg)  ?Height: 5\' 1"  ([id number] m)  ? ?Wt  Readings from Last 3 Encounters:  ?01/16/22 175 lb 6 oz (79.5 kg)  ?01/09/22 178 lb (80.7 kg)  ?01/08/22 178 lb 5.6 oz (80.9 kg)  ? ?Lab Results  ?Component Value Date  ? CREATININE 0.95 01/08/2022  ? CREATININE 0.95 01/07/2022  ? CREATININE 0.92 01/06/2022  ? ?Physical Exam ?Vitals and nursing note reviewed.  ?Constitutional:   ?   Appearance: Normal appearance.  ?HENT:  ?   Head: Normocephalic and atraumatic.  ?Cardiovascular:  ?   Rate and Rhythm: Normal rate. Rhythm irregular.  ?Pulmonary:  ?   Effort: Pulmonary effort is normal. No respiratory distress.  ?   Breath sounds: No wheezing or rales.  ?Abdominal:  ?   General: There is no distension.  ?   Palpations: Abdomen is soft.  ?  Tenderness: There is no abdominal tenderness.  ?Musculoskeletal:     ?   General: No tenderness.  ?   Cervical back: Normal range of motion and neck supple.  ?   Right lower leg: Edema (2+ pitting) present.  ?   Left lower leg: Edema (2+ pitting) present.  ?Skin: ?   General: Skin is warm and dry.  ?Neurological:  ?   General: No focal deficit present.  ?   Mental Status: She is alert and oriented to person, place, and time.  ?Psychiatric:     ?   Mood and Affect: Mood normal.     ?   Behavior: Behavior normal.     ?   Thought Content: Thought content normal.  ? ? ?Assessment & Plan: ? ?1: Chronic heart failure with preserved ejection fraction with structural changes (LAE)- ?- NYHA class II ?- euvolemic ?- weighing daily; reminded to call for an overnight weight gain of > 2 pounds or a weekly weight gain of > 5 pounds ?- does add salt to her eggs but says that she's trying to not add any salt to anything else; trying to keep daily sodium intake to ~ 2000mg  / day ?- drinking ~ 32 ounces water, 12 ounce soda, 1-2 cups of coffee and some milk during the day ?- will begin jardiance 10mg  daily; 3 weeks samples and a 14 day voucher provided; instructed to make sure she wipes herself dry after urinating to decrease likelihood of UTI/ yeast  infection ?- will check BMP next visit ?- BNP 01/04/22 was 537.6 ? ?2: HTN- ?- BP looks good (121/57) ?- saw PCP (Vigg) 12/27/21 ?- BMP 01/08/22 reviewed and showed sodium 136, potassium 3.8, creatinine 0.95 and GFR

## 2022-01-15 NOTE — Anesthesia Postprocedure Evaluation (Signed)
Anesthesia Post Note ? ?Patient: Amanda [name] ? ?Procedure(s) Performed: XI ROBOTIC ASSISTED LAPAROSCOPIC CHOLECYSTECTOMY ?INDOCYANINE GREEN FLUORESCENCE IMAGING (ICG) ? ?Patient location during evaluation: PACU ?Anesthesia Type: General ?Level of consciousness: awake and alert ?Pain management: pain level controlled ?Vital Signs Assessment: post-procedure vital signs reviewed and stable ?Respiratory status: spontaneous breathing, nonlabored ventilation, respiratory function stable and patient connected to nasal cannula oxygen ?Cardiovascular status: blood pressure returned to baseline and stable ?Postop Assessment: no apparent nausea or vomiting ?Anesthetic complications: no ? ? ?No notable events documented. ? ? ?Last Vitals:  ?Vitals:  ? 01/09/22 1647 01/09/22 1657  ?BP: (!) 97/55 (!) 108/58  ?Pulse: 63 71  ?Resp: 18   ?Temp: (!) 35.8 ?C   ?SpO2: 92% 94%  ?  ?Last Pain:  ?Vitals:  ? 01/10/22 0847  ?TempSrc:   ?PainSc: 0-No pain  ? ? ?  ?  ?  ?  ?  ?  ? ?Amanda [name] ? ? ? ? ?

## 2022-01-15 NOTE — Telephone Encounter (Signed)
Copied from [place] [id number]. Topic: Quick Communication - Home Health Verbal Orders ?>> Jan 15, 2022  9:12 AM Leward Quan A wrote: ?Caller/Agency: [facility]  ?Callback Number: [phone] Ok to [facility]  ?Requesting OT/PT/Skilled Nursing/Social Work/Speech Therapy: Skilled Nursing  ?Frequency: 1 w 3, 1 every 2 w 6 ?

## 2022-01-16 ENCOUNTER — Encounter: Payer: Self-pay | Admitting: Family

## 2022-01-16 ENCOUNTER — Ambulatory Visit: Payer: Medicare HMO | Attending: Family | Admitting: Family

## 2022-01-16 VITALS — BP 121/57 | HR 80 | Resp 12 | Ht 61.0 in | Wt 175.4 lb

## 2022-01-16 DIAGNOSIS — I890 Lymphedema, not elsewhere classified: Secondary | ICD-10-CM | POA: Insufficient documentation

## 2022-01-16 DIAGNOSIS — I110 Hypertensive heart disease with heart failure: Secondary | ICD-10-CM | POA: Diagnosis not present

## 2022-01-16 DIAGNOSIS — Z833 Family history of diabetes mellitus: Secondary | ICD-10-CM | POA: Insufficient documentation

## 2022-01-16 DIAGNOSIS — Z87891 Personal history of nicotine dependence: Secondary | ICD-10-CM | POA: Insufficient documentation

## 2022-01-16 DIAGNOSIS — I5032 Chronic diastolic (congestive) heart failure: Secondary | ICD-10-CM | POA: Diagnosis not present

## 2022-01-16 DIAGNOSIS — Z7985 Long-term (current) use of injectable non-insulin antidiabetic drugs: Secondary | ICD-10-CM | POA: Insufficient documentation

## 2022-01-16 DIAGNOSIS — I4891 Unspecified atrial fibrillation: Secondary | ICD-10-CM | POA: Insufficient documentation

## 2022-01-16 DIAGNOSIS — I480 Paroxysmal atrial fibrillation: Secondary | ICD-10-CM

## 2022-01-16 DIAGNOSIS — Z8249 Family history of ischemic heart disease and other diseases of the circulatory system: Secondary | ICD-10-CM | POA: Insufficient documentation

## 2022-01-16 DIAGNOSIS — E785 Hyperlipidemia, unspecified: Secondary | ICD-10-CM | POA: Diagnosis not present

## 2022-01-16 DIAGNOSIS — Z7901 Long term (current) use of anticoagulants: Secondary | ICD-10-CM | POA: Diagnosis not present

## 2022-01-16 DIAGNOSIS — I10 Essential (primary) hypertension: Secondary | ICD-10-CM

## 2022-01-16 DIAGNOSIS — E119 Type 2 diabetes mellitus without complications: Secondary | ICD-10-CM | POA: Diagnosis not present

## 2022-01-16 DIAGNOSIS — K219 Gastro-esophageal reflux disease without esophagitis: Secondary | ICD-10-CM | POA: Insufficient documentation

## 2022-01-16 MED ORDER — EMPAGLIFLOZIN 10 MG PO TABS: 10.0000 mg | ORAL_TABLET | Freq: Every day | ORAL | 0 refills | Status: DC

## 2022-01-16 NOTE — Patient Instructions (Addendum)
Continue weighing daily and call for an overnight weight gain of 3 pounds or more or a weekly weight gain of more than 5 pounds. ? ? ?If you have voicemail, please make sure your mailbox is cleaned out so that we may leave a message and please make sure to listen to any voicemails.  ? ? ?Begin jardiance 10mg  as 1 tablet in the morning.  ? ?

## 2022-01-17 DIAGNOSIS — J9601 Acute respiratory failure with hypoxia: Secondary | ICD-10-CM | POA: Diagnosis not present

## 2022-01-17 DIAGNOSIS — F419 Anxiety disorder, unspecified: Secondary | ICD-10-CM | POA: Diagnosis not present

## 2022-01-17 DIAGNOSIS — E1122 Type 2 diabetes mellitus with diabetic chronic kidney disease: Secondary | ICD-10-CM | POA: Diagnosis not present

## 2022-01-17 DIAGNOSIS — M1990 Unspecified osteoarthritis, unspecified site: Secondary | ICD-10-CM | POA: Diagnosis not present

## 2022-01-17 DIAGNOSIS — I130 Hypertensive heart and chronic kidney disease with heart failure and stage 1 through stage 4 chronic kidney disease, or unspecified chronic kidney disease: Secondary | ICD-10-CM | POA: Diagnosis not present

## 2022-01-17 DIAGNOSIS — Z9359 Other cystostomy status: Secondary | ICD-10-CM | POA: Diagnosis not present

## 2022-01-17 DIAGNOSIS — I5033 Acute on chronic diastolic (congestive) heart failure: Secondary | ICD-10-CM | POA: Diagnosis not present

## 2022-01-17 DIAGNOSIS — Z7985 Long-term (current) use of injectable non-insulin antidiabetic drugs: Secondary | ICD-10-CM | POA: Diagnosis not present

## 2022-01-17 DIAGNOSIS — I480 Paroxysmal atrial fibrillation: Secondary | ICD-10-CM | POA: Diagnosis not present

## 2022-01-17 DIAGNOSIS — Z6834 Body mass index (BMI) 34.0-34.9, adult: Secondary | ICD-10-CM | POA: Diagnosis not present

## 2022-01-17 DIAGNOSIS — E785 Hyperlipidemia, unspecified: Secondary | ICD-10-CM | POA: Diagnosis not present

## 2022-01-17 DIAGNOSIS — Z8701 Personal history of pneumonia (recurrent): Secondary | ICD-10-CM | POA: Diagnosis not present

## 2022-01-17 DIAGNOSIS — N1831 Chronic kidney disease, stage 3a: Secondary | ICD-10-CM | POA: Diagnosis not present

## 2022-01-17 DIAGNOSIS — Z9049 Acquired absence of other specified parts of digestive tract: Secondary | ICD-10-CM | POA: Diagnosis not present

## 2022-01-17 DIAGNOSIS — Z48815 Encounter for surgical aftercare following surgery on the digestive system: Secondary | ICD-10-CM | POA: Diagnosis not present

## 2022-01-17 DIAGNOSIS — E669 Obesity, unspecified: Secondary | ICD-10-CM | POA: Diagnosis not present

## 2022-01-17 DIAGNOSIS — D631 Anemia in chronic kidney disease: Secondary | ICD-10-CM | POA: Diagnosis not present

## 2022-01-17 DIAGNOSIS — K219 Gastro-esophageal reflux disease without esophagitis: Secondary | ICD-10-CM | POA: Diagnosis not present

## 2022-01-17 DIAGNOSIS — I161 Hypertensive emergency: Secondary | ICD-10-CM | POA: Diagnosis not present

## 2022-01-17 DIAGNOSIS — Z7901 Long term (current) use of anticoagulants: Secondary | ICD-10-CM | POA: Diagnosis not present

## 2022-01-17 DIAGNOSIS — E876 Hypokalemia: Secondary | ICD-10-CM | POA: Diagnosis not present

## 2022-01-18 ENCOUNTER — Telehealth: Payer: Self-pay | Admitting: Internal Medicine

## 2022-01-18 DIAGNOSIS — E785 Hyperlipidemia, unspecified: Secondary | ICD-10-CM | POA: Diagnosis not present

## 2022-01-18 DIAGNOSIS — E1169 Type 2 diabetes mellitus with other specified complication: Secondary | ICD-10-CM | POA: Diagnosis not present

## 2022-01-18 DIAGNOSIS — E1165 Type 2 diabetes mellitus with hyperglycemia: Secondary | ICD-10-CM

## 2022-01-18 DIAGNOSIS — I5033 Acute on chronic diastolic (congestive) heart failure: Secondary | ICD-10-CM | POA: Diagnosis not present

## 2022-01-18 NOTE — Telephone Encounter (Signed)
Copied from CRM [id number]. Topic: Quick Communication - Home Health Verbal Orders ?>> Jan 18, 2022 10:32 AM Marylen Ponto wrote: ?Caller/Agency:  Joey with [facility] ?Callback Number: [phone] ?Requesting OT/PT/Skilled Nursing/Social Work/Speech Therapy: PT  ?Frequency: 1 time a week for 6 week for endurance and strength ?

## 2022-01-21 NOTE — Telephone Encounter (Signed)
Called and LVM giving verbal orders for the patient.  °

## 2022-01-21 NOTE — Telephone Encounter (Signed)
OK for verbal orders?

## 2022-01-22 ENCOUNTER — Encounter: Payer: Self-pay | Admitting: Physician Assistant

## 2022-01-22 ENCOUNTER — Encounter: Payer: Self-pay | Admitting: Internal Medicine

## 2022-01-22 ENCOUNTER — Ambulatory Visit (INDEPENDENT_AMBULATORY_CARE_PROVIDER_SITE_OTHER): Payer: Medicare HMO | Admitting: Internal Medicine

## 2022-01-22 ENCOUNTER — Ambulatory Visit (INDEPENDENT_AMBULATORY_CARE_PROVIDER_SITE_OTHER): Payer: Medicare HMO | Admitting: Physician Assistant

## 2022-01-22 VITALS — BP 113/59 | HR 73 | Temp 98.3°F | Ht 60.98 in | Wt 163.0 lb

## 2022-01-22 VITALS — BP 99/63 | HR 74 | Temp 97.5°F | Ht 61.0 in | Wt 162.6 lb

## 2022-01-22 DIAGNOSIS — I5033 Acute on chronic diastolic (congestive) heart failure: Secondary | ICD-10-CM | POA: Diagnosis not present

## 2022-01-22 DIAGNOSIS — K819 Cholecystitis, unspecified: Secondary | ICD-10-CM

## 2022-01-22 DIAGNOSIS — I509 Heart failure, unspecified: Secondary | ICD-10-CM | POA: Diagnosis not present

## 2022-01-22 DIAGNOSIS — E119 Type 2 diabetes mellitus without complications: Secondary | ICD-10-CM

## 2022-01-22 DIAGNOSIS — Z09 Encounter for follow-up examination after completed treatment for conditions other than malignant neoplasm: Secondary | ICD-10-CM

## 2022-01-22 DIAGNOSIS — I480 Paroxysmal atrial fibrillation: Secondary | ICD-10-CM

## 2022-01-22 DIAGNOSIS — I10 Essential (primary) hypertension: Secondary | ICD-10-CM | POA: Diagnosis not present

## 2022-01-22 DIAGNOSIS — K8010 Calculus of gallbladder with chronic cholecystitis without obstruction: Secondary | ICD-10-CM

## 2022-01-22 LAB — BAYER DCA HB A1C WAIVED: HB A1C (BAYER DCA - WAIVED): 6.9 % — ABNORMAL HIGH (ref 4.8–5.6)

## 2022-01-22 LAB — MICROSCOPIC EXAMINATION: RBC, Urine: NONE SEEN /hpf (ref 0–2)

## 2022-01-22 LAB — URINALYSIS, ROUTINE W REFLEX MICROSCOPIC
Bilirubin, UA: NEGATIVE
Ketones, UA: NEGATIVE
Leukocytes,UA: NEGATIVE
Nitrite, UA: NEGATIVE
RBC, UA: NEGATIVE
Specific Gravity, UA: 1.015 (ref 1.005–1.030)
Urobilinogen, Ur: 0.2 mg/dL (ref 0.2–1.0)
pH, UA: 7 (ref 5.0–7.5)

## 2022-01-22 NOTE — Progress Notes (Signed)
? ?BP (!) 113/59   Pulse 73   Temp 98.3 ?F (36.8 ?C) (Oral)   Ht 5' 0.98" (1.549 m)   Wt 163 lb (73.9 kg)   SpO2 98%   BMI 30.81 kg/m?   ? ?Subjective:  ? ? Patient ID: Amanda [name], female    DOB: [birth year], 66 y.o.   MRN: [id number] ? ?Chief Complaint  ?Patient presents with  ? Hospitalization Follow-up  ? ? ?HPI: ?Amanda [name] is a 66 y.o. female ? ?Acute on chronic diastolic CHF ?HAD SOB sec to fluid overload  ?Admit date: 01/04/2022 ?Discharge date: ?  01/08/2022 ?Surg -- 3/22 nd day surgery  ? ?Seen cards this am is still in afib was rx another medication which is pending review per insurance. To have a cardioversion if amiodarone.  ? ?Shortness of Breath ?This is a chronic problem.  ?Congestive Heart Failure ?Presents for follow-up (chf sec to afib seen dr Nehemiah Massed) visit. Associated symptoms include shortness of breath.  ? ? ? ?Chief Complaint  ?Patient presents with  ? Hospitalization Follow-up  ? ? ?Relevant past medical, surgical, family and social history reviewed and updated as indicated. Interim medical history since our last visit reviewed. ?Allergies and medications reviewed and updated. ? ?Review of Systems  ?Respiratory:  Positive for shortness of breath.   ? ?Per HPI unless specifically indicated above ? ?   ?Objective:  ?  ?BP (!) 113/59   Pulse 73   Temp 98.3 ?F (36.8 ?C) (Oral)   Ht 5' 0.98" (1.549 m)   Wt 163 lb (73.9 kg)   SpO2 98%   BMI 30.81 kg/m?   ?Wt Readings from Last 3 Encounters:  ?01/22/22 163 lb (73.9 kg)  ?01/22/22 162 lb 9.6 oz (73.8 kg)  ?01/16/22 175 lb 6 oz (79.5 kg)  ?  ?Physical Exam ?Vitals and nursing note reviewed.  ?Constitutional:   ?   General: She is not in acute distress. ?   Appearance: Normal appearance. She is not ill-appearing or diaphoretic.  ?HENT:  ?   Head: Normocephalic and atraumatic.  ?   Right Ear: Tympanic membrane and external ear normal. There is no impacted cerumen.  ?   Left Ear: External ear normal.  ?   Nose: No congestion or rhinorrhea.  ?    Mouth/Throat:  ?   Pharynx: No oropharyngeal exudate or posterior oropharyngeal erythema.  ?Eyes:  ?   Conjunctiva/sclera: Conjunctivae normal.  ?   Pupils: Pupils are equal, round, and reactive to light.  ?Cardiovascular:  ?   Rate and Rhythm: Normal rate and regular rhythm.  ?   Heart sounds: No murmur heard. ?  No friction rub. No gallop.  ?Pulmonary:  ?   Effort: No respiratory distress.  ?   Breath sounds: No stridor. No wheezing or rhonchi.  ?Chest:  ?   Chest wall: No tenderness.  ?Abdominal:  ?   General: Abdomen is flat. Bowel sounds are normal. There is no distension.  ?   Palpations: Abdomen is soft. There is no mass.  ?   Tenderness: There is no abdominal tenderness. There is no guarding.  ?Musculoskeletal:     ?   General: No swelling or deformity.  ?   Cervical back: Normal range of motion and neck supple. No rigidity or tenderness.  ?   Right lower leg: No edema.  ?   Left lower leg: No edema.  ?Skin: ?   General: Skin is warm and dry.  ?  Coloration: Skin is not jaundiced.  ?   Findings: No erythema.  ?Neurological:  ?   Mental Status: She is alert and oriented to person, place, and time. Mental status is at baseline.  ?Psychiatric:     ?   Mood and Affect: Mood normal.     ?   Behavior: Behavior normal.     ?   Thought Content: Thought content normal.     ?   Judgment: Judgment normal.  ? ? ?Results for orders placed or performed in visit on 01/22/22  ?Microscopic Examination  ? Urine  ?Result Value Ref Range  ? WBC, UA 0-5 0 - 5 /hpf  ? RBC None seen 0 - 2 /hpf  ? Epithelial Cells (non renal) 0-10 0 - 10 /hpf  ? Mucus, UA Present (A) Not Estab.  ? Bacteria, UA Few (A) None seen/Few  ?CBC with Differential/Platelet  ?Result Value Ref Range  ? WBC 7.1 3.4 - 10.8 x10E3/uL  ? RBC 3.57 (L) 3.77 - 5.28 x10E6/uL  ? Hemoglobin 9.3 (L) 11.1 - 15.9 g/dL  ? Hematocrit 29.1 (L) 34.0 - 46.6 %  ? MCV 82 79 - 97 fL  ? MCH 26.1 (L) 26.6 - 33.0 pg  ? MCHC 32.0 31.5 - 35.7 g/dL  ? RDW 14.0 11.7 - 15.4 %  ?  Platelets 372 150 - 450 x10E3/uL  ? Neutrophils 62 Not Estab. %  ? Lymphs 28 Not Estab. %  ? Monocytes 7 Not Estab. %  ? Eos 3 Not Estab. %  ? Basos 0 Not Estab. %  ? Neutrophils Absolute 4.4 1.4 - 7.0 x10E3/uL  ? Lymphocytes Absolute 2.0 0.7 - 3.1 x10E3/uL  ? Monocytes Absolute 0.5 0.1 - 0.9 x10E3/uL  ? EOS (ABSOLUTE) 0.2 0.0 - 0.4 x10E3/uL  ? Basophils Absolute 0.0 0.0 - 0.2 x10E3/uL  ? Immature Granulocytes 0 Not Estab. %  ? Immature Grans (Abs) 0.0 0.0 - 0.1 x10E3/uL  ?Comprehensive metabolic panel  ?Result Value Ref Range  ? Glucose 157 (H) 70 - 99 mg/dL  ? BUN 17 8 - 27 mg/dL  ? Creatinine, Ser 1.11 (H) 0.57 - 1.00 mg/dL  ? eGFR 55 (L) >59 mL/min/1.73  ? BUN/Creatinine Ratio 15 12 - 28  ? Sodium 138 134 - 144 mmol/L  ? Potassium 4.3 3.5 - 5.2 mmol/L  ? Chloride 98 96 - 106 mmol/L  ? CO2 24 20 - 29 mmol/L  ? Calcium 9.4 8.7 - 10.3 mg/dL  ? Total Protein 7.1 6.0 - 8.5 g/dL  ? Albumin 3.9 3.8 - 4.8 g/dL  ? Globulin, Total 3.2 1.5 - 4.5 g/dL  ? Albumin/Globulin Ratio 1.2 1.2 - 2.2  ? Bilirubin Total 0.3 0.0 - 1.2 mg/dL  ? Alkaline Phosphatase 111 44 - 121 IU/L  ? AST 19 0 - 40 IU/L  ? ALT 10 0 - 32 IU/L  ?Urinalysis, Routine w reflex microscopic  ?Result Value Ref Range  ? Specific Gravity, UA 1.015 1.005 - 1.030  ? pH, UA 7.0 5.0 - 7.5  ? Color, UA Yellow Yellow  ? Appearance Ur Cloudy (A) Clear  ? Leukocytes,UA Negative Negative  ? Protein,UA 1+ (A) Negative/Trace  ? Glucose, UA 3+ (A) Negative  ? Ketones, UA Negative Negative  ? RBC, UA Negative Negative  ? Bilirubin, UA Negative Negative  ? Urobilinogen, Ur 0.2 0.2 - 1.0 mg/dL  ? Nitrite, UA Negative Negative  ? Microscopic Examination See below:   ?Bayer DCA Hb A1c Waived (STAT)  ?  Result Value Ref Range  ? HB A1C (BAYER DCA - WAIVED) 6.9 (H) 4.8 - 5.6 %  ? ?   ? ? ?Current Outpatient Medications:  ?  acetaminophen (TYLENOL) 500 MG tablet, Take 2 tablets (1,000 mg total) by mouth every 6 (six) hours as needed for mild pain., Disp: , Rfl:  ?  acetaminophen  (TYLENOL) 500 MG tablet, Take by mouth., Disp: , Rfl:  ?  albuterol (VENTOLIN HFA) 108 (90 Base) MCG/ACT inhaler, Inhale 2 puffs into the lungs every 6 (six) hours as needed for wheezing or shortness of breath., Disp: 8 g, Rfl: 0 ?  amiodarone (PACERONE) 200 MG tablet, Take 1 tablet by mouth 2 (two) times daily., Disp: , Rfl:  ?  apixaban (ELIQUIS) 5 MG TABS tablet, Take 5 mg by mouth 2 (two) times daily., Disp: , Rfl:  ?  benzonatate (TESSALON) 100 MG capsule, Take 1 capsule (100 mg total) by mouth 2 (two) times daily as needed for cough., Disp: 20 capsule, Rfl: 0 ?  Calcium Carb-Cholecalciferol (CALCIUM 600+D3) 600-200 MG-UNIT TABS, Take 1 tablet by mouth daily., Disp: , Rfl:  ?  carvedilol (COREG) 12.5 MG tablet, Take 1 tablet (12.5 mg total) by mouth 2 (two) times daily with a meal., Disp: 180 tablet, Rfl: 1 ?  diltiazem (CARDIZEM CD) 180 MG 24 hr capsule, Take 1 capsule (180 mg total) by mouth daily., Disp: 90 capsule, Rfl: 1 ?  Dulaglutide (TRULICITY) 3 TW/6.5KC SOPN, Inject 3 mg as directed once a week., Disp: 6 mL, Rfl: 3 ?  empagliflozin (JARDIANCE) 10 MG TABS tablet, Take 1 tablet (10 mg total) by mouth daily before breakfast., Disp: 14 tablet, Rfl: 0 ?  ferrous sulfate 325 (65 FE) MG tablet, Take 1 tablet (325 mg total) by mouth daily., Disp: 30 tablet, Rfl: 3 ?  furosemide (LASIX) 40 MG tablet, Take 49m twice daily for 4 days, then take 415monce daily., Disp: 60 tablet, Rfl: 2 ?  Multiple Vitamin (MULTIVITAMIN) tablet, Take 1 tablet by mouth daily., Disp: , Rfl:  ?  omeprazole (PRILOSEC) 20 MG capsule, Take 20 mg by mouth daily., Disp: , Rfl:  ?  oxyCODONE (OXY IR/ROXICODONE) 5 MG immediate release tablet, Take 1 tablet (5 mg total) by mouth every 4 (four) hours as needed for severe pain., Disp: 30 tablet, Rfl: 0 ?  potassium chloride SA (KLOR-CON M) 20 MEQ tablet, Take 1 tablet (20 mEq total) by mouth daily., Disp: 30 tablet, Rfl: 2 ?  traZODone (DESYREL) 50 MG tablet, Take 1 tablet (50 mg total) by  mouth at bedtime., Disp: 30 tablet, Rfl: 0 ?  vitamin B-12 (CYANOCOBALAMIN) 500 MCG tablet, Take 500 mcg by mouth daily., Disp: , Rfl:   ? ? ?Assessment & Plan:  ?Afib is on eliquis, cardizem and lasix 40 mg as well as amio

## 2022-01-22 NOTE — Progress Notes (Signed)
 [facility] ?POST-OP OFFICE VISIT ? ?01/22/2022 ? ?HPI: ?Amanda [name] is a 66 y.o. female 12 days s/p robotic assisted laparoscopic cholecystectomy for acute cholecystitis with Dr Hampton Abbot  ? ?She has done remarkably well ?No issues with pain ?No fever, chills, nausea, emesis, or diarrhea ?No issues with incisions ?Tolerating PO ? ?Vital signs: ?BP 99/63   Pulse 74   Temp (!) 97.5 ?F (36.4 ?C) (Oral)   Ht 5\' 1"  (1.549 m)   Wt 162 lb 9.6 oz (73.8 kg)   SpO2 94%   BMI 30.72 kg/m?   ? ?Physical Exam: ?Constitutional: Well appearing female, NAD ?Abdomen: Soft, non-tender, non-distended, no rebound/guarding ?Skin: Laparoscopic incisions are healing well, no erythema or drainage  ? ?Assessment/Plan: ?This is a 66 y.o. female 12 days s/p robotic assisted laparoscopic cholecystectomy for acute cholecystitis ? ? - Pain control prn ? - Reviewed wound care recommendation ? - Reviewed lifting restrictions; 4 weeks total ? - Reviewed surgical pathology; [place] ? - She can follow up on as needed basis; She understands to call with questions/concerns ? ?-- ?Edison Simon, PA-C ? [facility] ?01/22/2022, 2:00 PM ?[phone] ?M-F: 7am - 4pm ? ?

## 2022-01-22 NOTE — Patient Instructions (Signed)
If you have any concerns or questions, please feel free to call our office. Follow up as needed.    GENERAL POST-OPERATIVE PATIENT INSTRUCTIONS   WOUND CARE INSTRUCTIONS:  Keep a dry clean dressing on the wound if there is drainage. The initial bandage may be removed after 24 hours.  Once the wound has quit draining you may leave it open to air.  If clothing rubs against the wound or causes irritation and the wound is not draining you may cover it with a dry dressing during the daytime.  Try to keep the wound dry and avoid ointments on the wound unless directed to do so.  If the wound becomes bright red and painful or starts to drain infected material that is not clear, please contact your physician immediately.  If the wound is mildly pink and has a thick firm ridge underneath it, this is normal, and is referred to as a healing ridge.  This will resolve over the next 4-6 weeks.  BATHING: You may shower if you have been informed of this by your surgeon. However, Please do not submerge in a tub, hot tub, or pool until incisions are completely sealed or have been told by your surgeon that you may do so.  DIET:  You may eat any foods that you can tolerate.  It is a good idea to eat a high fiber diet and take in plenty of fluids to prevent constipation.  If you do become constipated you may want to take a mild laxative or take ducolax tablets on a daily basis until your bowel habits are regular.  Constipation can be very uncomfortable, along with straining, after recent surgery.  ACTIVITY:  You are encouraged to cough and deep breath or use your incentive spirometer if you were given one, every 15-30 minutes when awake.  This will help prevent respiratory complications and low grade fevers post-operatively if you had a general anesthetic.  You may want to hug a pillow when coughing and sneezing to add additional support to the surgical area, if you had abdominal or chest surgery, which will decrease pain  during these times.  You are encouraged to walk and engage in light activity for the next two weeks.  You should not lift more than 20 pounds for 4 weeks total after surgery as it could put you at increased risk for complications.  Twenty pounds is roughly equivalent to a plastic bag of groceries. At that time- Listen to your body when lifting, if you have pain when lifting, stop and then try again in a few days. Soreness after doing exercises or activities of daily living is normal as you get back in to your normal routine.  MEDICATIONS:  Try to take narcotic medications and anti-inflammatory medications, such as tylenol, ibuprofen, naprosyn, etc., with food.  This will minimize stomach upset from the medication.  Should you develop nausea and vomiting from the pain medication, or develop a rash, please discontinue the medication and contact your physician.  You should not drive, make important decisions, or operate machinery when taking narcotic pain medication.  SUNBLOCK Use sun block to incision area over the next year if this area will be exposed to sun. This helps decrease scarring and will allow you avoid a permanent darkened area over your incision.  QUESTIONS:  Please feel free to call our office if you have any questions, and we will be glad to assist you. [phone]   

## 2022-01-23 LAB — CBC WITH DIFFERENTIAL/PLATELET
Basophils Absolute: 0 10*3/uL (ref 0.0–0.2)
Basos: 0 %
EOS (ABSOLUTE): 0.2 10*3/uL (ref 0.0–0.4)
Eos: 3 %
Hematocrit: 29.1 % — ABNORMAL LOW (ref 34.0–46.6)
Hemoglobin: 9.3 g/dL — ABNORMAL LOW (ref 11.1–15.9)
Immature Grans (Abs): 0 10*3/uL (ref 0.0–0.1)
Immature Granulocytes: 0 %
Lymphocytes Absolute: 2 10*3/uL (ref 0.7–3.1)
Lymphs: 28 %
MCH: 26.1 pg — ABNORMAL LOW (ref 26.6–33.0)
MCHC: 32 g/dL (ref 31.5–35.7)
MCV: 82 fL (ref 79–97)
Monocytes Absolute: 0.5 10*3/uL (ref 0.1–0.9)
Monocytes: 7 %
Neutrophils Absolute: 4.4 10*3/uL (ref 1.4–7.0)
Neutrophils: 62 %
Platelets: 372 10*3/uL (ref 150–450)
RBC: 3.57 x10E6/uL — ABNORMAL LOW (ref 3.77–5.28)
RDW: 14 % (ref 11.7–15.4)
WBC: 7.1 10*3/uL (ref 3.4–10.8)

## 2022-01-23 LAB — COMPREHENSIVE METABOLIC PANEL
ALT: 10 IU/L (ref 0–32)
AST: 19 IU/L (ref 0–40)
Albumin/Globulin Ratio: 1.2 (ref 1.2–2.2)
Albumin: 3.9 g/dL (ref 3.8–4.8)
Alkaline Phosphatase: 111 IU/L (ref 44–121)
BUN/Creatinine Ratio: 15 (ref 12–28)
BUN: 17 mg/dL (ref 8–27)
Bilirubin Total: 0.3 mg/dL (ref 0.0–1.2)
CO2: 24 mmol/L (ref 20–29)
Calcium: 9.4 mg/dL (ref 8.7–10.3)
Chloride: 98 mmol/L (ref 96–106)
Creatinine, Ser: 1.11 mg/dL — ABNORMAL HIGH (ref 0.57–1.00)
Globulin, Total: 3.2 g/dL (ref 1.5–4.5)
Glucose: 157 mg/dL — ABNORMAL HIGH (ref 70–99)
Potassium: 4.3 mmol/L (ref 3.5–5.2)
Sodium: 138 mmol/L (ref 134–144)
Total Protein: 7.1 g/dL (ref 6.0–8.5)
eGFR: 55 mL/min/{1.73_m2} — ABNORMAL LOW (ref >59)

## 2022-01-24 DIAGNOSIS — Z7985 Long-term (current) use of injectable non-insulin antidiabetic drugs: Secondary | ICD-10-CM | POA: Diagnosis not present

## 2022-01-24 DIAGNOSIS — Z7901 Long term (current) use of anticoagulants: Secondary | ICD-10-CM | POA: Diagnosis not present

## 2022-01-24 DIAGNOSIS — Z9049 Acquired absence of other specified parts of digestive tract: Secondary | ICD-10-CM | POA: Diagnosis not present

## 2022-01-24 DIAGNOSIS — D631 Anemia in chronic kidney disease: Secondary | ICD-10-CM | POA: Diagnosis not present

## 2022-01-24 DIAGNOSIS — Z8701 Personal history of pneumonia (recurrent): Secondary | ICD-10-CM | POA: Diagnosis not present

## 2022-01-24 DIAGNOSIS — I480 Paroxysmal atrial fibrillation: Secondary | ICD-10-CM | POA: Diagnosis not present

## 2022-01-24 DIAGNOSIS — F419 Anxiety disorder, unspecified: Secondary | ICD-10-CM | POA: Diagnosis not present

## 2022-01-24 DIAGNOSIS — Z48815 Encounter for surgical aftercare following surgery on the digestive system: Secondary | ICD-10-CM | POA: Diagnosis not present

## 2022-01-24 DIAGNOSIS — Z9359 Other cystostomy status: Secondary | ICD-10-CM | POA: Diagnosis not present

## 2022-01-24 DIAGNOSIS — I130 Hypertensive heart and chronic kidney disease with heart failure and stage 1 through stage 4 chronic kidney disease, or unspecified chronic kidney disease: Secondary | ICD-10-CM | POA: Diagnosis not present

## 2022-01-24 DIAGNOSIS — I161 Hypertensive emergency: Secondary | ICD-10-CM | POA: Diagnosis not present

## 2022-01-24 DIAGNOSIS — M1990 Unspecified osteoarthritis, unspecified site: Secondary | ICD-10-CM | POA: Diagnosis not present

## 2022-01-24 DIAGNOSIS — E669 Obesity, unspecified: Secondary | ICD-10-CM | POA: Diagnosis not present

## 2022-01-24 DIAGNOSIS — K219 Gastro-esophageal reflux disease without esophagitis: Secondary | ICD-10-CM | POA: Diagnosis not present

## 2022-01-24 DIAGNOSIS — N1831 Chronic kidney disease, stage 3a: Secondary | ICD-10-CM | POA: Diagnosis not present

## 2022-01-24 DIAGNOSIS — E876 Hypokalemia: Secondary | ICD-10-CM | POA: Diagnosis not present

## 2022-01-24 DIAGNOSIS — Z6834 Body mass index (BMI) 34.0-34.9, adult: Secondary | ICD-10-CM | POA: Diagnosis not present

## 2022-01-24 DIAGNOSIS — E1122 Type 2 diabetes mellitus with diabetic chronic kidney disease: Secondary | ICD-10-CM | POA: Diagnosis not present

## 2022-01-24 DIAGNOSIS — I5033 Acute on chronic diastolic (congestive) heart failure: Secondary | ICD-10-CM | POA: Diagnosis not present

## 2022-01-24 DIAGNOSIS — J9601 Acute respiratory failure with hypoxia: Secondary | ICD-10-CM | POA: Diagnosis not present

## 2022-01-24 DIAGNOSIS — E785 Hyperlipidemia, unspecified: Secondary | ICD-10-CM | POA: Diagnosis not present

## 2022-01-30 DIAGNOSIS — E785 Hyperlipidemia, unspecified: Secondary | ICD-10-CM | POA: Diagnosis not present

## 2022-01-30 DIAGNOSIS — E1122 Type 2 diabetes mellitus with diabetic chronic kidney disease: Secondary | ICD-10-CM | POA: Diagnosis not present

## 2022-01-30 DIAGNOSIS — Z6834 Body mass index (BMI) 34.0-34.9, adult: Secondary | ICD-10-CM | POA: Diagnosis not present

## 2022-01-30 DIAGNOSIS — M1990 Unspecified osteoarthritis, unspecified site: Secondary | ICD-10-CM | POA: Diagnosis not present

## 2022-01-30 DIAGNOSIS — E876 Hypokalemia: Secondary | ICD-10-CM | POA: Diagnosis not present

## 2022-01-30 DIAGNOSIS — Z48815 Encounter for surgical aftercare following surgery on the digestive system: Secondary | ICD-10-CM | POA: Diagnosis not present

## 2022-01-30 DIAGNOSIS — I161 Hypertensive emergency: Secondary | ICD-10-CM | POA: Diagnosis not present

## 2022-01-30 DIAGNOSIS — I130 Hypertensive heart and chronic kidney disease with heart failure and stage 1 through stage 4 chronic kidney disease, or unspecified chronic kidney disease: Secondary | ICD-10-CM | POA: Diagnosis not present

## 2022-01-30 DIAGNOSIS — I480 Paroxysmal atrial fibrillation: Secondary | ICD-10-CM | POA: Diagnosis not present

## 2022-01-30 DIAGNOSIS — Z8701 Personal history of pneumonia (recurrent): Secondary | ICD-10-CM | POA: Diagnosis not present

## 2022-01-30 DIAGNOSIS — J9601 Acute respiratory failure with hypoxia: Secondary | ICD-10-CM | POA: Diagnosis not present

## 2022-01-30 DIAGNOSIS — N1831 Chronic kidney disease, stage 3a: Secondary | ICD-10-CM | POA: Diagnosis not present

## 2022-01-30 DIAGNOSIS — Z9359 Other cystostomy status: Secondary | ICD-10-CM | POA: Diagnosis not present

## 2022-01-30 DIAGNOSIS — D631 Anemia in chronic kidney disease: Secondary | ICD-10-CM | POA: Diagnosis not present

## 2022-01-30 DIAGNOSIS — Z7901 Long term (current) use of anticoagulants: Secondary | ICD-10-CM | POA: Diagnosis not present

## 2022-01-30 DIAGNOSIS — E669 Obesity, unspecified: Secondary | ICD-10-CM | POA: Diagnosis not present

## 2022-01-30 DIAGNOSIS — Z9049 Acquired absence of other specified parts of digestive tract: Secondary | ICD-10-CM | POA: Diagnosis not present

## 2022-01-30 DIAGNOSIS — K219 Gastro-esophageal reflux disease without esophagitis: Secondary | ICD-10-CM | POA: Diagnosis not present

## 2022-01-30 DIAGNOSIS — Z7985 Long-term (current) use of injectable non-insulin antidiabetic drugs: Secondary | ICD-10-CM | POA: Diagnosis not present

## 2022-01-30 DIAGNOSIS — I5033 Acute on chronic diastolic (congestive) heart failure: Secondary | ICD-10-CM | POA: Diagnosis not present

## 2022-01-30 DIAGNOSIS — F419 Anxiety disorder, unspecified: Secondary | ICD-10-CM | POA: Diagnosis not present

## 2022-02-06 DIAGNOSIS — Z7985 Long-term (current) use of injectable non-insulin antidiabetic drugs: Secondary | ICD-10-CM | POA: Diagnosis not present

## 2022-02-06 DIAGNOSIS — E669 Obesity, unspecified: Secondary | ICD-10-CM | POA: Diagnosis not present

## 2022-02-06 DIAGNOSIS — Z7901 Long term (current) use of anticoagulants: Secondary | ICD-10-CM | POA: Diagnosis not present

## 2022-02-06 DIAGNOSIS — Z9359 Other cystostomy status: Secondary | ICD-10-CM | POA: Diagnosis not present

## 2022-02-06 DIAGNOSIS — I130 Hypertensive heart and chronic kidney disease with heart failure and stage 1 through stage 4 chronic kidney disease, or unspecified chronic kidney disease: Secondary | ICD-10-CM | POA: Diagnosis not present

## 2022-02-06 DIAGNOSIS — E785 Hyperlipidemia, unspecified: Secondary | ICD-10-CM | POA: Diagnosis not present

## 2022-02-06 DIAGNOSIS — F419 Anxiety disorder, unspecified: Secondary | ICD-10-CM | POA: Diagnosis not present

## 2022-02-06 DIAGNOSIS — I480 Paroxysmal atrial fibrillation: Secondary | ICD-10-CM | POA: Diagnosis not present

## 2022-02-06 DIAGNOSIS — M1990 Unspecified osteoarthritis, unspecified site: Secondary | ICD-10-CM | POA: Diagnosis not present

## 2022-02-06 DIAGNOSIS — I5033 Acute on chronic diastolic (congestive) heart failure: Secondary | ICD-10-CM | POA: Diagnosis not present

## 2022-02-06 DIAGNOSIS — K219 Gastro-esophageal reflux disease without esophagitis: Secondary | ICD-10-CM | POA: Diagnosis not present

## 2022-02-06 DIAGNOSIS — Z9049 Acquired absence of other specified parts of digestive tract: Secondary | ICD-10-CM | POA: Diagnosis not present

## 2022-02-06 DIAGNOSIS — D631 Anemia in chronic kidney disease: Secondary | ICD-10-CM | POA: Diagnosis not present

## 2022-02-06 DIAGNOSIS — E876 Hypokalemia: Secondary | ICD-10-CM | POA: Diagnosis not present

## 2022-02-06 DIAGNOSIS — Z8701 Personal history of pneumonia (recurrent): Secondary | ICD-10-CM | POA: Diagnosis not present

## 2022-02-06 DIAGNOSIS — Z6834 Body mass index (BMI) 34.0-34.9, adult: Secondary | ICD-10-CM | POA: Diagnosis not present

## 2022-02-06 DIAGNOSIS — E1122 Type 2 diabetes mellitus with diabetic chronic kidney disease: Secondary | ICD-10-CM | POA: Diagnosis not present

## 2022-02-06 DIAGNOSIS — I161 Hypertensive emergency: Secondary | ICD-10-CM | POA: Diagnosis not present

## 2022-02-06 DIAGNOSIS — Z48815 Encounter for surgical aftercare following surgery on the digestive system: Secondary | ICD-10-CM | POA: Diagnosis not present

## 2022-02-06 DIAGNOSIS — J9601 Acute respiratory failure with hypoxia: Secondary | ICD-10-CM | POA: Diagnosis not present

## 2022-02-06 DIAGNOSIS — N1831 Chronic kidney disease, stage 3a: Secondary | ICD-10-CM | POA: Diagnosis not present

## 2022-02-07 DIAGNOSIS — F419 Anxiety disorder, unspecified: Secondary | ICD-10-CM | POA: Diagnosis not present

## 2022-02-07 DIAGNOSIS — E669 Obesity, unspecified: Secondary | ICD-10-CM | POA: Diagnosis not present

## 2022-02-07 DIAGNOSIS — Z9049 Acquired absence of other specified parts of digestive tract: Secondary | ICD-10-CM | POA: Diagnosis not present

## 2022-02-07 DIAGNOSIS — I480 Paroxysmal atrial fibrillation: Secondary | ICD-10-CM | POA: Diagnosis not present

## 2022-02-07 DIAGNOSIS — Z7985 Long-term (current) use of injectable non-insulin antidiabetic drugs: Secondary | ICD-10-CM | POA: Diagnosis not present

## 2022-02-07 DIAGNOSIS — Z7901 Long term (current) use of anticoagulants: Secondary | ICD-10-CM | POA: Diagnosis not present

## 2022-02-07 DIAGNOSIS — Z9359 Other cystostomy status: Secondary | ICD-10-CM | POA: Diagnosis not present

## 2022-02-07 DIAGNOSIS — Z6834 Body mass index (BMI) 34.0-34.9, adult: Secondary | ICD-10-CM | POA: Diagnosis not present

## 2022-02-07 DIAGNOSIS — E785 Hyperlipidemia, unspecified: Secondary | ICD-10-CM | POA: Diagnosis not present

## 2022-02-07 DIAGNOSIS — I130 Hypertensive heart and chronic kidney disease with heart failure and stage 1 through stage 4 chronic kidney disease, or unspecified chronic kidney disease: Secondary | ICD-10-CM | POA: Diagnosis not present

## 2022-02-07 DIAGNOSIS — M1990 Unspecified osteoarthritis, unspecified site: Secondary | ICD-10-CM | POA: Diagnosis not present

## 2022-02-07 DIAGNOSIS — I161 Hypertensive emergency: Secondary | ICD-10-CM | POA: Diagnosis not present

## 2022-02-07 DIAGNOSIS — E1122 Type 2 diabetes mellitus with diabetic chronic kidney disease: Secondary | ICD-10-CM | POA: Diagnosis not present

## 2022-02-07 DIAGNOSIS — E876 Hypokalemia: Secondary | ICD-10-CM | POA: Diagnosis not present

## 2022-02-07 DIAGNOSIS — N1831 Chronic kidney disease, stage 3a: Secondary | ICD-10-CM | POA: Diagnosis not present

## 2022-02-07 DIAGNOSIS — K219 Gastro-esophageal reflux disease without esophagitis: Secondary | ICD-10-CM | POA: Diagnosis not present

## 2022-02-07 DIAGNOSIS — J9601 Acute respiratory failure with hypoxia: Secondary | ICD-10-CM | POA: Diagnosis not present

## 2022-02-07 DIAGNOSIS — Z8701 Personal history of pneumonia (recurrent): Secondary | ICD-10-CM | POA: Diagnosis not present

## 2022-02-07 DIAGNOSIS — I5033 Acute on chronic diastolic (congestive) heart failure: Secondary | ICD-10-CM | POA: Diagnosis not present

## 2022-02-07 DIAGNOSIS — D631 Anemia in chronic kidney disease: Secondary | ICD-10-CM | POA: Diagnosis not present

## 2022-02-07 DIAGNOSIS — Z48815 Encounter for surgical aftercare following surgery on the digestive system: Secondary | ICD-10-CM | POA: Diagnosis not present

## 2022-02-11 DIAGNOSIS — I161 Hypertensive emergency: Secondary | ICD-10-CM | POA: Diagnosis not present

## 2022-02-11 DIAGNOSIS — I480 Paroxysmal atrial fibrillation: Secondary | ICD-10-CM | POA: Diagnosis not present

## 2022-02-11 DIAGNOSIS — E1122 Type 2 diabetes mellitus with diabetic chronic kidney disease: Secondary | ICD-10-CM | POA: Diagnosis not present

## 2022-02-11 DIAGNOSIS — J9601 Acute respiratory failure with hypoxia: Secondary | ICD-10-CM | POA: Diagnosis not present

## 2022-02-11 DIAGNOSIS — I130 Hypertensive heart and chronic kidney disease with heart failure and stage 1 through stage 4 chronic kidney disease, or unspecified chronic kidney disease: Secondary | ICD-10-CM | POA: Diagnosis not present

## 2022-02-11 DIAGNOSIS — D631 Anemia in chronic kidney disease: Secondary | ICD-10-CM | POA: Diagnosis not present

## 2022-02-11 DIAGNOSIS — Z7985 Long-term (current) use of injectable non-insulin antidiabetic drugs: Secondary | ICD-10-CM | POA: Diagnosis not present

## 2022-02-11 DIAGNOSIS — Z7901 Long term (current) use of anticoagulants: Secondary | ICD-10-CM | POA: Diagnosis not present

## 2022-02-11 DIAGNOSIS — E785 Hyperlipidemia, unspecified: Secondary | ICD-10-CM | POA: Diagnosis not present

## 2022-02-11 DIAGNOSIS — E876 Hypokalemia: Secondary | ICD-10-CM | POA: Diagnosis not present

## 2022-02-11 DIAGNOSIS — Z6834 Body mass index (BMI) 34.0-34.9, adult: Secondary | ICD-10-CM | POA: Diagnosis not present

## 2022-02-11 DIAGNOSIS — K219 Gastro-esophageal reflux disease without esophagitis: Secondary | ICD-10-CM | POA: Diagnosis not present

## 2022-02-11 DIAGNOSIS — M1990 Unspecified osteoarthritis, unspecified site: Secondary | ICD-10-CM | POA: Diagnosis not present

## 2022-02-11 DIAGNOSIS — F419 Anxiety disorder, unspecified: Secondary | ICD-10-CM | POA: Diagnosis not present

## 2022-02-11 DIAGNOSIS — Z8701 Personal history of pneumonia (recurrent): Secondary | ICD-10-CM | POA: Diagnosis not present

## 2022-02-11 DIAGNOSIS — I5033 Acute on chronic diastolic (congestive) heart failure: Secondary | ICD-10-CM | POA: Diagnosis not present

## 2022-02-11 DIAGNOSIS — Z9049 Acquired absence of other specified parts of digestive tract: Secondary | ICD-10-CM | POA: Diagnosis not present

## 2022-02-11 DIAGNOSIS — E669 Obesity, unspecified: Secondary | ICD-10-CM | POA: Diagnosis not present

## 2022-02-11 DIAGNOSIS — Z48815 Encounter for surgical aftercare following surgery on the digestive system: Secondary | ICD-10-CM | POA: Diagnosis not present

## 2022-02-11 DIAGNOSIS — Z9359 Other cystostomy status: Secondary | ICD-10-CM | POA: Diagnosis not present

## 2022-02-11 DIAGNOSIS — N1831 Chronic kidney disease, stage 3a: Secondary | ICD-10-CM | POA: Diagnosis not present

## 2022-02-12 MED ORDER — SODIUM CHLORIDE 0.9 % IV SOLN: INTRAVENOUS | Status: DC

## 2022-02-13 ENCOUNTER — Ambulatory Visit
Admission: RE | Admit: 2022-02-13 | Discharge: 2022-02-13 | Disposition: A | Discharge status: Home / Self Care | Payer: Medicare HMO | Attending: Internal Medicine | Admitting: Internal Medicine

## 2022-02-13 ENCOUNTER — Other Ambulatory Visit: Payer: Self-pay

## 2022-02-13 ENCOUNTER — Encounter
Admission: RE | Disposition: A | Discharge status: Home / Self Care | Payer: Self-pay | Source: Home / Self Care | Attending: Internal Medicine

## 2022-02-13 ENCOUNTER — Encounter: Payer: Self-pay | Admitting: Certified Registered"

## 2022-02-13 ENCOUNTER — Encounter: Payer: Self-pay | Admitting: Internal Medicine

## 2022-02-13 ENCOUNTER — Ambulatory Visit: Payer: Medicare HMO | Admitting: Family

## 2022-02-13 DIAGNOSIS — Z538 Procedure and treatment not carried out for other reasons: Secondary | ICD-10-CM | POA: Insufficient documentation

## 2022-02-13 DIAGNOSIS — I4891 Unspecified atrial fibrillation: Secondary | ICD-10-CM | POA: Diagnosis not present

## 2022-02-13 DIAGNOSIS — I480 Paroxysmal atrial fibrillation: Secondary | ICD-10-CM | POA: Diagnosis not present

## 2022-02-13 DIAGNOSIS — Z7901 Long term (current) use of anticoagulants: Secondary | ICD-10-CM | POA: Diagnosis not present

## 2022-02-13 DIAGNOSIS — I110 Hypertensive heart disease with heart failure: Secondary | ICD-10-CM | POA: Diagnosis not present

## 2022-02-13 DIAGNOSIS — I5033 Acute on chronic diastolic (congestive) heart failure: Secondary | ICD-10-CM | POA: Insufficient documentation

## 2022-02-13 DIAGNOSIS — Z79899 Other long term (current) drug therapy: Secondary | ICD-10-CM | POA: Diagnosis not present

## 2022-02-13 HISTORY — PX: CARDIOVERSION: SHX1299

## 2022-02-13 LAB — GLUCOSE, CAPILLARY: Glucose-Capillary: 132 mg/dL — ABNORMAL HIGH (ref 70–99)

## 2022-02-13 SURGERY — CARDIOVERSION
Anesthesia: General

## 2022-02-13 NOTE — Progress Notes (Signed)
Procedure cancelled per MD. Patient in normal sinus/sinus brady rhythm confirmed with ECG.  ?

## 2022-02-13 NOTE — Anesthesia Preprocedure Evaluation (Deleted)
Anesthesia Evaluation  ?Patient identified by MRN, date of birth, ID band ?Patient awake ? ? ? ?Reviewed: ?Allergy & Precautions, H&P , NPO status , Patient's Chart, lab work & pertinent test results, reviewed documented beta blocker date and time  ? ?Airway ?Mallampati: II ? ? ?Neck ROM: full ? ? ? Dental ? ?(+) Poor Dentition ?  ?Pulmonary ?shortness of breath and with exertion, pneumonia, resolved, former smoker,  ?  ?Pulmonary exam normal ? ? ? ? ? ? ? Cardiovascular ?Exercise Tolerance: Poor ?hypertension, On Medications ?+CHF  ?Atrial Fibrillation  ?Rhythm:regular Rate:Normal ? ? ?  ?Neuro/Psych ?Anxiety negative neurological ROS ? negative psych ROS  ? GI/Hepatic ?Neg liver ROS, GERD  Medicated,  ?Endo/Other  ?negative endocrine ROSdiabetes, Type 2, Oral Hypoglycemic Agents ? Renal/GU ?Renal disease  ?negative genitourinary ?  ?Musculoskeletal ? ? Abdominal ?  ?Peds ? Hematology ? ?(+) Blood dyscrasia, anemia ,   ?Anesthesia Other Findings ?Past Medical History: ?No date: Anxiety ?No date: Arthritis ?No date: Atrial fibrillation (HCC) ?No date: CHF (congestive heart failure) (HCC) ?No date: Cholecystitis ?No date: Diabetes mellitus without complication (HCC) ?No date: Dyspnea ?No date: GERD (gastroesophageal reflux disease) ?No date: Hyperlipidemia ?No date: Hypertension ?Past Surgical History: ?2012: COLONOSCOPY ?12/11/2021: IR CHOLANGIOGRAM EXISTING TUBE ?11/02/2021: IR PERC CHOLECYSTOSTOMY ? ? Reproductive/Obstetrics ?negative OB ROS ? ?  ? ? ? ? ? ? ? ? ? ? ? ? ? ?  ?  ? ? ? ? ? ? ? ? ?Anesthesia Physical ?Anesthesia Plan ? ?ASA: 4 ? ?Anesthesia Plan: General  ? ?Post-op Pain Management:   ? ?Induction:  ? ?PONV Risk Score and Plan:  ? ?Airway Management Planned:  ? ?Additional Equipment:  ? ?Intra-op Plan:  ? ?Post-operative Plan:  ? ?Informed Consent: I have reviewed the patients History and Physical, chart, labs and discussed the procedure including the risks, benefits  and alternatives for the proposed anesthesia with the patient or authorized representative who has indicated his/her understanding and acceptance.  ? ? ? ?Dental Advisory Given ? ?Plan Discussed with: CRNA ? ?Anesthesia Plan Comments:   ? ? ? ? ? ? ?Anesthesia Quick Evaluation ? ?

## 2022-02-14 DIAGNOSIS — I130 Hypertensive heart and chronic kidney disease with heart failure and stage 1 through stage 4 chronic kidney disease, or unspecified chronic kidney disease: Secondary | ICD-10-CM | POA: Diagnosis not present

## 2022-02-14 DIAGNOSIS — Z7901 Long term (current) use of anticoagulants: Secondary | ICD-10-CM | POA: Diagnosis not present

## 2022-02-14 DIAGNOSIS — Z7985 Long-term (current) use of injectable non-insulin antidiabetic drugs: Secondary | ICD-10-CM | POA: Diagnosis not present

## 2022-02-14 DIAGNOSIS — N1831 Chronic kidney disease, stage 3a: Secondary | ICD-10-CM | POA: Diagnosis not present

## 2022-02-14 DIAGNOSIS — I5033 Acute on chronic diastolic (congestive) heart failure: Secondary | ICD-10-CM | POA: Diagnosis not present

## 2022-02-14 DIAGNOSIS — Z8701 Personal history of pneumonia (recurrent): Secondary | ICD-10-CM | POA: Diagnosis not present

## 2022-02-14 DIAGNOSIS — Z9359 Other cystostomy status: Secondary | ICD-10-CM | POA: Diagnosis not present

## 2022-02-14 DIAGNOSIS — F419 Anxiety disorder, unspecified: Secondary | ICD-10-CM | POA: Diagnosis not present

## 2022-02-14 DIAGNOSIS — M1990 Unspecified osteoarthritis, unspecified site: Secondary | ICD-10-CM | POA: Diagnosis not present

## 2022-02-14 DIAGNOSIS — I480 Paroxysmal atrial fibrillation: Secondary | ICD-10-CM | POA: Diagnosis not present

## 2022-02-14 DIAGNOSIS — Z9049 Acquired absence of other specified parts of digestive tract: Secondary | ICD-10-CM | POA: Diagnosis not present

## 2022-02-14 DIAGNOSIS — K219 Gastro-esophageal reflux disease without esophagitis: Secondary | ICD-10-CM | POA: Diagnosis not present

## 2022-02-14 DIAGNOSIS — I161 Hypertensive emergency: Secondary | ICD-10-CM | POA: Diagnosis not present

## 2022-02-14 DIAGNOSIS — E876 Hypokalemia: Secondary | ICD-10-CM | POA: Diagnosis not present

## 2022-02-14 DIAGNOSIS — Z48815 Encounter for surgical aftercare following surgery on the digestive system: Secondary | ICD-10-CM | POA: Diagnosis not present

## 2022-02-14 DIAGNOSIS — E669 Obesity, unspecified: Secondary | ICD-10-CM | POA: Diagnosis not present

## 2022-02-14 DIAGNOSIS — J9601 Acute respiratory failure with hypoxia: Secondary | ICD-10-CM | POA: Diagnosis not present

## 2022-02-14 DIAGNOSIS — E1122 Type 2 diabetes mellitus with diabetic chronic kidney disease: Secondary | ICD-10-CM | POA: Diagnosis not present

## 2022-02-14 DIAGNOSIS — E785 Hyperlipidemia, unspecified: Secondary | ICD-10-CM | POA: Diagnosis not present

## 2022-02-14 DIAGNOSIS — D631 Anemia in chronic kidney disease: Secondary | ICD-10-CM | POA: Diagnosis not present

## 2022-02-14 DIAGNOSIS — Z6834 Body mass index (BMI) 34.0-34.9, adult: Secondary | ICD-10-CM | POA: Diagnosis not present

## 2022-02-15 NOTE — Progress Notes (Signed)
? Patient ID: Amanda [name], female    DOB: 1956/09/06, 66 y.o.   MRN: [id number] ? ?HPI ? ?Amanda [name] is a 66 y/o female with a history of DM, hyperlipidemia, HTN, anxiety, atrial fibrillation, GERD, previous tobacco use and chronic heart failure.  ? ?Echo report from 01/05/22 reviewed and showed an EF of 50-55% along with moderate LAE & moderate MR.  ? ?Admitted 01/04/22 due to shortness of breath. CT scan showed pleural effusions. Cardiology and surgical consults obtained. Initially given IV lasix with subsequent diuresis of ~ 10L with transition to oral diuretics. Initially needed 4L oxygen but able to be weaned off to room air. Chest CTA showed multiple pulmonary nodules. OT consult obtained. Discharged after 4 days with plan to return the next day for cholecystectomy.  ? ?Amanda [name] presents today with a chief complaint of a follow-up visit. Currently voices no complaints and specifically denies any difficulty sleeping, dizziness, abdominal distention, palpitations, pedal edema, chest pain, shortness of breath, cough, fatigue or weight gain.  ? ?Did not have to be cardioverted as Amanda [name] was in NSR on the day of the cardioversion.  ? ?Wearing compression socks daily without any further edema. Reports not having any swelling even if Amanda [name] forgets to put them on. Overall is feeling quite well.  ? ?BP at home running 120-130's/ 70-80's ? ?Past Medical History:  ?Diagnosis Date  ? Anxiety   ? Arthritis   ? Atrial fibrillation (HCC)   ? CHF (congestive heart failure) (HCC)   ? Cholecystitis   ? Diabetes mellitus without complication (HCC)   ? Dyspnea   ? GERD (gastroesophageal reflux disease)   ? Hyperlipidemia   ? Hypertension   ? ?Past Surgical History:  ?Procedure Laterality Date  ? COLONOSCOPY  2012  ? IR CHOLANGIOGRAM EXISTING TUBE  12/11/2021  ? IR PERC CHOLECYSTOSTOMY  11/02/2021  ? ?Family History  ?Problem Relation Age of Onset  ? COPD Mother   ? Hypertension Mother   ? Diabetes Mother   ? Severe combined immunodeficiency Father    ? Diabetes Sister   ? Heart disease Sister   ? Heart disease Sister   ? Cancer Niece   ? ?Social History  ? ?Tobacco Use  ? Smoking status: Former  ?  Types: Cigarettes  ?  Quit date: 12/07/1975  ?  Years since quitting: 46.2  ? Smokeless tobacco: Never  ?Substance Use Topics  ? Alcohol use: No  ? ?Allergies  ?Allergen Reactions  ? Tramadol Other (See Comments)  ?  "woozy"  ? Penicillins Other (See Comments)  ?  Dizziness ?Tolerated augmentin but does not like taste  ? ?Prior to Admission medications   ?Medication Sig Start Date End Date Taking? Authorizing Provider  ?amiodarone (PACERONE) 200 MG tablet Take 200 mg by mouth 2 (two) times daily. 01/22/22 01/22/23 Yes [provider]  ?apixaban (ELIQUIS) 5 MG TABS tablet Take 5 mg by mouth 2 (two) times daily.   Yes [provider]  ?Calcium Carb-Cholecalciferol (CALCIUM 600+D3) 600-200 MG-UNIT TABS Take 1 tablet by mouth in the morning.   Yes [provider]  ?carvedilol (COREG) 12.5 MG tablet Take 1 tablet (12.5 mg total) by mouth 2 (two) times daily with a meal. 12/06/21  Yes Johnson, Megan P, DO  ?diltiazem (CARDIZEM CD) 180 MG 24 hr capsule Take 1 capsule (180 mg total) by mouth daily. 12/06/21  Yes Johnson, Megan P, DO  ?Dulaglutide (TRULICITY) 3 MG/0.5ML SOPN Inject 3 mg as directed once a week. ?  Patient taking differently: Inject 3 mg as directed every Monday. 12/24/21 03/24/22 Yes Vigg, [name], MD  ?ferrous sulfate 325 (65 FE) MG tablet Take 1 tablet (325 mg total) by mouth daily. 01/08/22 05/08/22 Yes Osvaldo Shipper, MD  ?furosemide (LASIX) 40 MG tablet Take 40mg  twice daily for 4 days, then take 40mg  once daily. ?Patient taking differently: Take 40 mg by mouth daily. 01/08/22  Yes , MD  ?Multiple Vitamin (MULTIVITAMIN) tablet Take 1 tablet by mouth daily.   Yes [provider]  ?omeprazole (PRILOSEC) 20 MG capsule Take 20 mg by mouth daily.   Yes [provider]  ?potassium chloride SA (KLOR-CON M) 20 MEQ  tablet Take 1 tablet (20 mEq total) by mouth daily. 01/08/22  Yes Osvaldo Shipper, MD  ?traZODone (DESYREL) 50 MG tablet Take 1 tablet (50 mg total) by mouth at bedtime. 11/08/21  Yes Wieting, Richard, MD  ?vitamin B-12 (CYANOCOBALAMIN) 500 MCG tablet Take 500 mcg by mouth daily.   Yes [provider]  ?albuterol (VENTOLIN HFA) 108 (90 Base) MCG/ACT inhaler Inhale 2 puffs into the lungs every 6 (six) hours as needed for wheezing or shortness of breath. ?Patient not taking: Reported on 02/08/2022 12/27/21   02/10/2022, MD  ?empagliflozin (JARDIANCE) 10 MG TABS tablet Take 1 tablet (10 mg total) by mouth daily before breakfast. 02/18/22   Loura Pardon, FNP  ?oxyCODONE (OXY IR/ROXICODONE) 5 MG immediate release tablet Take 1 tablet (5 mg total) by mouth every 4 (four) hours as needed for severe pain. ?Patient not taking: Reported on 02/08/2022 01/09/22   02/10/2022, MD  ? ?Review of Systems  ?Constitutional:  Negative for appetite change and fatigue.  ?HENT:  Negative for congestion, postnasal drip and sore throat.   ?Eyes: Negative.   ?Respiratory:  Negative for cough, chest tightness and shortness of breath.   ?Cardiovascular:  Negative for chest pain, palpitations and leg swelling.  ?Gastrointestinal:  Negative for abdominal distention and abdominal pain.  ?Endocrine: Negative.   ?Genitourinary: Negative.   ?Musculoskeletal:  Negative for back pain and neck pain.  ?Skin: Negative.   ?Allergic/Immunologic: Negative.   ?Neurological:  Negative for dizziness and light-headedness.  ?Hematological:  Negative for adenopathy. Does not bruise/bleed easily.  ?Psychiatric/Behavioral:  Negative for dysphoric mood and sleep disturbance (sleeping on 2 pillows). The patient is not nervous/anxious.   ? ?Vitals:  ? 02/18/22 0920 02/18/22 0940  ?BP: (!) 154/58 (!) 146/72  ?Pulse: 61   ?Resp: 18   ?SpO2: 100%   ?Weight: 156 lb 6 oz (70.9 kg)   ?Height: 5\' 1"  (1.549 m)   ? ?Wt Readings from Last 3 Encounters:  ?02/18/22 156 lb  6 oz (70.9 kg)  ?02/13/22 151 lb 12.8 oz (68.9 kg)  ?01/22/22 163 lb (73.9 kg)  ? ?Lab Results  ?Component Value Date  ? CREATININE 1.11 (H) 01/22/2022  ? CREATININE 0.95 01/08/2022  ? CREATININE 0.95 01/07/2022  ? ?Physical Exam ?Vitals and nursing note reviewed.  ?Constitutional:   ?   Appearance: Normal appearance.  ?HENT:  ?   Head: Normocephalic and atraumatic.  ?Cardiovascular:  ?   Rate and Rhythm: Normal rate and regular rhythm.  ?Pulmonary:  ?   Effort: Pulmonary effort is normal. No respiratory distress.  ?   Breath sounds: No wheezing or rales.  ?Abdominal:  ?   General: There is no distension.  ?   Palpations: Abdomen is soft.  ?   Tenderness: There is no abdominal tenderness.  ?Musculoskeletal:     ?  General: No tenderness.  ?   Cervical back: Normal range of motion and neck supple.  ?   Right lower leg: No edema.  ?   Left lower leg: No edema.  ?Skin: ?   General: Skin is warm and dry.  ?Neurological:  ?   General: No focal deficit present.  ?   Mental Status: Amanda [name] is alert and oriented to person, place, and time.  ?Psychiatric:     ?   Mood and Affect: Mood normal.     ?   Behavior: Behavior normal.     ?   Thought Content: Thought content normal.  ? ? ?Assessment & Plan: ? ?1: Chronic heart failure with preserved ejection fraction with structural changes (LAE)- ?- NYHA class I ?- euvolemic ?- weighing daily; reminded to call for an overnight weight gain of > 2 pounds or a weekly weight gain of > 5 pounds ?- weight down 19 pounds from last visit here 1 month ago ?- does add salt to her eggs but says that Amanda [name]'s trying to not add any salt to anything else; trying to keep daily sodium intake to ~ 2000mg  / day ?- drinking ~ 32 ounces water, 12 ounce soda, 1-2 cups of coffee and some milk during the day ?- on GDMT of jardiance ?- consider adding entresto depending on lab results and BP ?- currently asymptomatic ?- BNP 01/04/22 was 537.6 ? ?2: HTN- ?- BP mildly elevated (146/72); home BP readings are more  normal ?- saw PCP (Vigg) 01/22/22 ?- BMP 01/22/22 reviewed and showed sodium 138, potassium 4.3, creatinine 1.11 and GFR 55 ?- renal function has worsened slightly since being on jardiance so will recheck BM

## 2022-02-17 ENCOUNTER — Encounter: Payer: Self-pay | Admitting: Internal Medicine

## 2022-02-18 ENCOUNTER — Ambulatory Visit (HOSPITAL_BASED_OUTPATIENT_CLINIC_OR_DEPARTMENT_OTHER): Payer: Medicare HMO | Admitting: Family

## 2022-02-18 ENCOUNTER — Telehealth: Payer: Self-pay | Admitting: Family

## 2022-02-18 ENCOUNTER — Other Ambulatory Visit
Admission: RE | Admit: 2022-02-18 | Discharge: 2022-02-18 | Disposition: A | Discharge status: Home / Self Care | Payer: Medicare HMO | Source: Ambulatory Visit | Attending: Family | Admitting: Family

## 2022-02-18 ENCOUNTER — Encounter: Payer: Self-pay | Admitting: Family

## 2022-02-18 VITALS — BP 146/72 | HR 61 | Resp 18 | Ht 61.0 in | Wt 156.4 lb

## 2022-02-18 DIAGNOSIS — F419 Anxiety disorder, unspecified: Secondary | ICD-10-CM | POA: Insufficient documentation

## 2022-02-18 DIAGNOSIS — E785 Hyperlipidemia, unspecified: Secondary | ICD-10-CM | POA: Insufficient documentation

## 2022-02-18 DIAGNOSIS — Z87891 Personal history of nicotine dependence: Secondary | ICD-10-CM | POA: Insufficient documentation

## 2022-02-18 DIAGNOSIS — I890 Lymphedema, not elsewhere classified: Secondary | ICD-10-CM | POA: Diagnosis not present

## 2022-02-18 DIAGNOSIS — I110 Hypertensive heart disease with heart failure: Secondary | ICD-10-CM | POA: Diagnosis not present

## 2022-02-18 DIAGNOSIS — Z79899 Other long term (current) drug therapy: Secondary | ICD-10-CM | POA: Insufficient documentation

## 2022-02-18 DIAGNOSIS — I5032 Chronic diastolic (congestive) heart failure: Secondary | ICD-10-CM | POA: Diagnosis not present

## 2022-02-18 DIAGNOSIS — Z7984 Long term (current) use of oral hypoglycemic drugs: Secondary | ICD-10-CM | POA: Insufficient documentation

## 2022-02-18 DIAGNOSIS — K219 Gastro-esophageal reflux disease without esophagitis: Secondary | ICD-10-CM | POA: Diagnosis not present

## 2022-02-18 DIAGNOSIS — I10 Essential (primary) hypertension: Secondary | ICD-10-CM

## 2022-02-18 DIAGNOSIS — E119 Type 2 diabetes mellitus without complications: Secondary | ICD-10-CM | POA: Insufficient documentation

## 2022-02-18 DIAGNOSIS — Z7901 Long term (current) use of anticoagulants: Secondary | ICD-10-CM | POA: Insufficient documentation

## 2022-02-18 DIAGNOSIS — I4891 Unspecified atrial fibrillation: Secondary | ICD-10-CM | POA: Diagnosis not present

## 2022-02-18 DIAGNOSIS — I480 Paroxysmal atrial fibrillation: Secondary | ICD-10-CM

## 2022-02-18 DIAGNOSIS — Z833 Family history of diabetes mellitus: Secondary | ICD-10-CM | POA: Insufficient documentation

## 2022-02-18 DIAGNOSIS — R69 Illness, unspecified: Secondary | ICD-10-CM | POA: Diagnosis not present

## 2022-02-18 LAB — BASIC METABOLIC PANEL
Anion gap: 9 (ref 5–15)
BUN: 27 mg/dL — ABNORMAL HIGH (ref 8–23)
CO2: 29 mmol/L (ref 22–32)
Calcium: 9.4 mg/dL (ref 8.9–10.3)
Chloride: 100 mmol/L (ref 98–111)
Creatinine, Ser: 1.27 mg/dL — ABNORMAL HIGH (ref 0.44–1.00)
GFR, Estimated: 47 mL/min — ABNORMAL LOW (ref >60)
Glucose, Bld: 173 mg/dL — ABNORMAL HIGH (ref 70–99)
Potassium: 4.1 mmol/L (ref 3.5–5.1)
Sodium: 138 mmol/L (ref 135–145)

## 2022-02-18 MED ORDER — EMPAGLIFLOZIN 10 MG PO TABS: 10.0000 mg | ORAL_TABLET | Freq: Every day | ORAL | 5 refills | Status: DC

## 2022-02-18 NOTE — Patient Instructions (Signed)
Continue weighing daily and call for an overnight weight gain of 3 pounds or more or a weekly weight gain of more than 5 pounds.   If you have voicemail, please make sure your mailbox is cleaned out so that we may leave a message and please make sure to listen to any voicemails.     

## 2022-02-18 NOTE — Telephone Encounter (Signed)
Spoke to patient regarding her BMP results obtained earlier today. Kidney function continues to decline with the addition of jardiance. GFR is 47 with creatinine of 1.27. Potassium level is normal.  ? ?During clinic visit today, she had no edema and weight was down 19 pounds over the last month. Will decrease her furosemide to 20mg  daily (was taking 40mg ). If she notices edema or gains >2 pounds overnight, she can take the 40mg  on that day, otherwise, she will take 20mg  daily.  ? ?Will recheck BMP next week. She verbalized understanding.  ?

## 2022-02-19 ENCOUNTER — Other Ambulatory Visit: Payer: Self-pay | Admitting: Family

## 2022-02-19 NOTE — Progress Notes (Signed)
Renal function worsening, will recheck labs next week. See telephone note for details ?

## 2022-02-20 DIAGNOSIS — I10 Essential (primary) hypertension: Secondary | ICD-10-CM | POA: Diagnosis not present

## 2022-02-20 DIAGNOSIS — I5032 Chronic diastolic (congestive) heart failure: Secondary | ICD-10-CM | POA: Insufficient documentation

## 2022-02-20 DIAGNOSIS — I480 Paroxysmal atrial fibrillation: Secondary | ICD-10-CM | POA: Diagnosis not present

## 2022-02-20 DIAGNOSIS — I5033 Acute on chronic diastolic (congestive) heart failure: Secondary | ICD-10-CM | POA: Diagnosis not present

## 2022-02-21 DIAGNOSIS — Z9049 Acquired absence of other specified parts of digestive tract: Secondary | ICD-10-CM | POA: Diagnosis not present

## 2022-02-21 DIAGNOSIS — I5033 Acute on chronic diastolic (congestive) heart failure: Secondary | ICD-10-CM | POA: Diagnosis not present

## 2022-02-21 DIAGNOSIS — I130 Hypertensive heart and chronic kidney disease with heart failure and stage 1 through stage 4 chronic kidney disease, or unspecified chronic kidney disease: Secondary | ICD-10-CM | POA: Diagnosis not present

## 2022-02-21 DIAGNOSIS — E1122 Type 2 diabetes mellitus with diabetic chronic kidney disease: Secondary | ICD-10-CM | POA: Diagnosis not present

## 2022-02-21 DIAGNOSIS — I161 Hypertensive emergency: Secondary | ICD-10-CM | POA: Diagnosis not present

## 2022-02-21 DIAGNOSIS — Z6834 Body mass index (BMI) 34.0-34.9, adult: Secondary | ICD-10-CM | POA: Diagnosis not present

## 2022-02-21 DIAGNOSIS — Z7901 Long term (current) use of anticoagulants: Secondary | ICD-10-CM | POA: Diagnosis not present

## 2022-02-21 DIAGNOSIS — D631 Anemia in chronic kidney disease: Secondary | ICD-10-CM | POA: Diagnosis not present

## 2022-02-21 DIAGNOSIS — J9601 Acute respiratory failure with hypoxia: Secondary | ICD-10-CM | POA: Diagnosis not present

## 2022-02-21 DIAGNOSIS — Z9359 Other cystostomy status: Secondary | ICD-10-CM | POA: Diagnosis not present

## 2022-02-21 DIAGNOSIS — F419 Anxiety disorder, unspecified: Secondary | ICD-10-CM | POA: Diagnosis not present

## 2022-02-21 DIAGNOSIS — K219 Gastro-esophageal reflux disease without esophagitis: Secondary | ICD-10-CM | POA: Diagnosis not present

## 2022-02-21 DIAGNOSIS — Z8701 Personal history of pneumonia (recurrent): Secondary | ICD-10-CM | POA: Diagnosis not present

## 2022-02-21 DIAGNOSIS — Z48815 Encounter for surgical aftercare following surgery on the digestive system: Secondary | ICD-10-CM | POA: Diagnosis not present

## 2022-02-21 DIAGNOSIS — I480 Paroxysmal atrial fibrillation: Secondary | ICD-10-CM | POA: Diagnosis not present

## 2022-02-21 DIAGNOSIS — Z7985 Long-term (current) use of injectable non-insulin antidiabetic drugs: Secondary | ICD-10-CM | POA: Diagnosis not present

## 2022-02-21 DIAGNOSIS — E785 Hyperlipidemia, unspecified: Secondary | ICD-10-CM | POA: Diagnosis not present

## 2022-02-21 DIAGNOSIS — E876 Hypokalemia: Secondary | ICD-10-CM | POA: Diagnosis not present

## 2022-02-21 DIAGNOSIS — N1831 Chronic kidney disease, stage 3a: Secondary | ICD-10-CM | POA: Diagnosis not present

## 2022-02-21 DIAGNOSIS — E669 Obesity, unspecified: Secondary | ICD-10-CM | POA: Diagnosis not present

## 2022-02-21 DIAGNOSIS — M1990 Unspecified osteoarthritis, unspecified site: Secondary | ICD-10-CM | POA: Diagnosis not present

## 2022-02-27 ENCOUNTER — Other Ambulatory Visit: Payer: Self-pay | Admitting: Family

## 2022-02-27 ENCOUNTER — Other Ambulatory Visit
Admission: RE | Admit: 2022-02-27 | Discharge: 2022-02-27 | Disposition: A | Discharge status: Home / Self Care | Payer: Medicare HMO | Source: Ambulatory Visit | Attending: Family | Admitting: Family

## 2022-02-27 ENCOUNTER — Telehealth: Payer: Self-pay | Admitting: Family

## 2022-02-27 DIAGNOSIS — I5032 Chronic diastolic (congestive) heart failure: Secondary | ICD-10-CM

## 2022-02-27 LAB — BASIC METABOLIC PANEL
Anion gap: 7 (ref 5–15)
BUN: 23 mg/dL (ref 8–23)
CO2: 27 mmol/L (ref 22–32)
Calcium: 9.5 mg/dL (ref 8.9–10.3)
Chloride: 104 mmol/L (ref 98–111)
Creatinine, Ser: 1.32 mg/dL — ABNORMAL HIGH (ref 0.44–1.00)
GFR, Estimated: 45 mL/min — ABNORMAL LOW (ref >60)
Glucose, Bld: 153 mg/dL — ABNORMAL HIGH (ref 70–99)
Potassium: 3.8 mmol/L (ref 3.5–5.1)
Sodium: 138 mmol/L (ref 135–145)

## 2022-02-27 NOTE — Progress Notes (Signed)
Order placed for BMP.

## 2022-02-27 NOTE — Telephone Encounter (Signed)
Spoke with patient regarding her most recent BMP results obtained earlier today. Renal function continues to decline with the use of jardiance even though her furosemide has been decreased back to 20mg  daily.  ? ?Advised patient to stop taking jardiance and will recheck her labs in 2 weeks. Patient verbalized understanding. May consider re-challenging her in the future.  ?

## 2022-03-04 ENCOUNTER — Telehealth: Payer: Self-pay

## 2022-03-04 NOTE — Chronic Care Management (AMB) (Signed)
Chronic Care Management Pharmacy Assistant   Name: Amanda [name]  MRN: [id number] DOB: 04/05/1956  Reason for Encounter: Disease State General   Recent office visits:  01/22/22-Amanda Charlotta Newton, MD (PCP) Hospital follow up visit. Labs ordered. Follow up in 6 weeks.  Recent consult visits:  02/20/22-Amanda Maureen Ralphs, MD (Cardiology) Patient presents with atrial fibrillation. EKG ordered. Decrease amiodarone dose to 200 mg. Stop ca++blocker. Follow up in 4 months. 02/18/22-Amanda [name], [place] (Cardiology) General cardiac follow up visit. Follow up in 3 months. 01/22/22-Amanda Lewis Shock, PA-C (General surgery) Post-op office visit. Return if symptoms worsen or fail to improve. 01/22/22-Amanda Maureen Ralphs, MD (Cardiology) Seen for leg swelling. EKG ordered. 01/16/22-Amanda [name], [place] (Cardiology) General cardiac follow up visit. Start on Jardiance 10 mg daily 3 weeks of samples was given to patient. Follow up in 1 month.  Hospital visits:  Medication Reconciliation was completed by comparing discharge summary, patient's EMR and Pharmacy list, and upon discussion with patient.  Admitted to the hospital on 01/04/22 due to shortness of breath. Discharge date was 01/08/22. Discharged from [facility].    New?Medications Started at [facility] Discharge:?? -started none noted Medication Changes at Hospital Discharge: -Changed none noted  Medications Discontinued at Hospital Discharge: -Stopped none noted  Medications that remain the same after Hospital Discharge:??  -All other medications will remain the same.     Medication Reconciliation was completed by comparing discharge summary, patient's EMR and Pharmacy list, and upon discussion with patient.  Admitted to the hospital on 11/02/21 due to Emesis. Discharge date was 11/08/21. Discharged from [facility].    New?Medications Started at [facility] Discharge:?? -started none noted  Medication Changes at  Hospital Discharge: -Changed none noted  Medications Discontinued at Hospital Discharge: -Stopped none noted  Medications that remain the same after Hospital Discharge:??  -All other medications will remain the same.     Medications: Outpatient Encounter Medications as of 03/04/2022  Medication Sig   albuterol (VENTOLIN HFA) 108 (90 Base) MCG/ACT inhaler Inhale 2 puffs into the lungs every 6 (six) hours as needed for wheezing or shortness of breath. (Patient not taking: Reported on 02/08/2022)   amiodarone (PACERONE) 200 MG tablet Take 200 mg by mouth 2 (two) times daily.   apixaban (ELIQUIS) 5 MG TABS tablet Take 5 mg by mouth 2 (two) times daily.   Calcium Carb-Cholecalciferol (CALCIUM 600+D3) 600-200 MG-UNIT TABS Take 1 tablet by mouth in the morning.   carvedilol (COREG) 12.5 MG tablet Take 1 tablet (12.5 mg total) by mouth 2 (two) times daily with a meal.   diltiazem (CARDIZEM CD) 180 MG 24 hr capsule Take 1 capsule (180 mg total) by mouth daily.   Dulaglutide (TRULICITY) 3 MG/0.5ML SOPN Inject 3 mg as directed once a week. (Patient taking differently: Inject 3 mg as directed every Monday.)   empagliflozin (JARDIANCE) 10 MG TABS tablet Take 1 tablet (10 mg total) by mouth daily before breakfast.   ferrous sulfate 325 (65 FE) MG tablet Take 1 tablet (325 mg total) by mouth daily.   furosemide (LASIX) 40 MG tablet Take 40mg  twice daily for 4 days, then take 40mg  once daily. (Patient taking differently: Take 40 mg by mouth daily.)   Multiple Vitamin (MULTIVITAMIN) tablet Take 1 tablet by mouth daily.   omeprazole (PRILOSEC) 20 MG capsule Take 20 mg by mouth daily.   oxyCODONE (OXY IR/ROXICODONE) 5 MG immediate release tablet Take 1 tablet (5 mg total) by mouth every 4 (four) hours  as needed for severe pain. (Patient not taking: Reported on 02/08/2022)   potassium chloride SA (KLOR-CON M) 20 MEQ tablet Take 1 tablet (20 mEq total) by mouth daily.   traZODone (DESYREL) 50 MG tablet Take 1  tablet (50 mg total) by mouth at bedtime.   vitamin B-12 (CYANOCOBALAMIN) 500 MCG tablet Take 500 mcg by mouth daily.   No facility-administered encounter medications on file as of 03/04/2022.   Contacted Amanda [name] for General Review Call   Chart Review:  Have there been any documented new, changed, or discontinued medications since last visit? Yes (If yes, include name, dose, frequency, date) 12/2921-Start on Jardiance 10 mg daily  Has there been any documented recent hospitalizations or ED visits since last visit with Clinical Pharmacist? No    Adherence Review:  Does the Clinical Pharmacist Assistant have access to adherence rates? Yes  Adherence rates for STAR metric medications (List medication(s)/day supply/ last 2 fill dates). See note below  Does the patient have >5 day gap between last estimated fill dates for any of the above medications or other medication gaps? No     Disease State Questions:  Able to connect with Patient? Yes Did patient have any problems with their health recently? No Note problems and Concerns:Patient states she has no problems with her health at this time.  Have you had any admissions or emergency room visits or worsening of your condition(s) since last visit? No Details of ED visit, hospital visit and/or worsening condition(s):N/a  Have you had any visits with new specialists or providers since your last visit? No Explain:N/a  Have you had any new health care problem(s) since your last visit? No New problem(s) reported:Patient states she has no new health problems.  Have you run out of any of your medications since you last spoke with clinical pharmacist? No What caused you to run out of your medications? Patient states she is up to date with all her medications.  Are there any medications you are not taking as prescribed? No What kept you from taking your medications as prescribed?N/a  Are you having any issues or side effects with  your medications? No Note of issues or side effects:Patient states she has no issues or side effects to any of her medications.  Do you have any other health concerns or questions you want to discuss with your Clinical Pharmacist before your next visit? No Note additional concerns and questions from Patient. Patient states there is nothing at this time.  Are there any health concerns that you feel we can do a better job addressing? No Note Patient's response. Patient states there is nothing at this time.  Are you having any problems with any of the following since the last visit: (select all that apply)  None  Details:Patient states she has been doing a lot better with her walking.  12. Any falls since last visit? No  Details:N/a  13. Any increased or uncontrolled pain since last visit? No  Details:N/a  14. Next visit Type: office       Visit with:Amanda Vigg, MD        Date:03/21/22        Time:9:20am  15. Additional Details? No    Care Gaps: Pneumonia Vaccine:Last completed: Dec 25, 2020 URINE MICROALBUMIN:Last completed: Dec 25, 2020  Star Rating Drugs: Jardiance 10 mg Last filled:02/18/22 30 DS Trulicity 3 mg Last filled:02/20/22 28 DS  [name] Carolin Coy, RMA Health Concierge

## 2022-03-08 DIAGNOSIS — E876 Hypokalemia: Secondary | ICD-10-CM | POA: Diagnosis not present

## 2022-03-08 DIAGNOSIS — E669 Obesity, unspecified: Secondary | ICD-10-CM | POA: Diagnosis not present

## 2022-03-08 DIAGNOSIS — N1831 Chronic kidney disease, stage 3a: Secondary | ICD-10-CM | POA: Diagnosis not present

## 2022-03-08 DIAGNOSIS — Z9049 Acquired absence of other specified parts of digestive tract: Secondary | ICD-10-CM | POA: Diagnosis not present

## 2022-03-08 DIAGNOSIS — I5033 Acute on chronic diastolic (congestive) heart failure: Secondary | ICD-10-CM | POA: Diagnosis not present

## 2022-03-08 DIAGNOSIS — I130 Hypertensive heart and chronic kidney disease with heart failure and stage 1 through stage 4 chronic kidney disease, or unspecified chronic kidney disease: Secondary | ICD-10-CM | POA: Diagnosis not present

## 2022-03-08 DIAGNOSIS — Z48815 Encounter for surgical aftercare following surgery on the digestive system: Secondary | ICD-10-CM | POA: Diagnosis not present

## 2022-03-08 DIAGNOSIS — I480 Paroxysmal atrial fibrillation: Secondary | ICD-10-CM | POA: Diagnosis not present

## 2022-03-08 DIAGNOSIS — Z7985 Long-term (current) use of injectable non-insulin antidiabetic drugs: Secondary | ICD-10-CM | POA: Diagnosis not present

## 2022-03-08 DIAGNOSIS — J9601 Acute respiratory failure with hypoxia: Secondary | ICD-10-CM | POA: Diagnosis not present

## 2022-03-08 DIAGNOSIS — Z7901 Long term (current) use of anticoagulants: Secondary | ICD-10-CM | POA: Diagnosis not present

## 2022-03-08 DIAGNOSIS — F419 Anxiety disorder, unspecified: Secondary | ICD-10-CM | POA: Diagnosis not present

## 2022-03-08 DIAGNOSIS — Z6834 Body mass index (BMI) 34.0-34.9, adult: Secondary | ICD-10-CM | POA: Diagnosis not present

## 2022-03-08 DIAGNOSIS — Z9359 Other cystostomy status: Secondary | ICD-10-CM | POA: Diagnosis not present

## 2022-03-08 DIAGNOSIS — E785 Hyperlipidemia, unspecified: Secondary | ICD-10-CM | POA: Diagnosis not present

## 2022-03-08 DIAGNOSIS — I161 Hypertensive emergency: Secondary | ICD-10-CM | POA: Diagnosis not present

## 2022-03-08 DIAGNOSIS — D631 Anemia in chronic kidney disease: Secondary | ICD-10-CM | POA: Diagnosis not present

## 2022-03-08 DIAGNOSIS — Z8701 Personal history of pneumonia (recurrent): Secondary | ICD-10-CM | POA: Diagnosis not present

## 2022-03-08 DIAGNOSIS — M1990 Unspecified osteoarthritis, unspecified site: Secondary | ICD-10-CM | POA: Diagnosis not present

## 2022-03-08 DIAGNOSIS — E1122 Type 2 diabetes mellitus with diabetic chronic kidney disease: Secondary | ICD-10-CM | POA: Diagnosis not present

## 2022-03-08 DIAGNOSIS — K219 Gastro-esophageal reflux disease without esophagitis: Secondary | ICD-10-CM | POA: Diagnosis not present

## 2022-03-13 ENCOUNTER — Other Ambulatory Visit
Admission: RE | Admit: 2022-03-13 | Discharge: 2022-03-13 | Disposition: A | Discharge status: Home / Self Care | Payer: Medicare HMO | Source: Ambulatory Visit | Attending: Family | Admitting: Family

## 2022-03-13 ENCOUNTER — Telehealth: Payer: Self-pay

## 2022-03-13 DIAGNOSIS — I5032 Chronic diastolic (congestive) heart failure: Secondary | ICD-10-CM | POA: Insufficient documentation

## 2022-03-13 LAB — BASIC METABOLIC PANEL
Anion gap: 8 (ref 5–15)
BUN: 20 mg/dL (ref 8–23)
CO2: 29 mmol/L (ref 22–32)
Calcium: 9.3 mg/dL (ref 8.9–10.3)
Chloride: 103 mmol/L (ref 98–111)
Creatinine, Ser: 1.1 mg/dL — ABNORMAL HIGH (ref 0.44–1.00)
GFR, Estimated: 55 mL/min — ABNORMAL LOW (ref >60)
Glucose, Bld: 149 mg/dL — ABNORMAL HIGH (ref 70–99)
Potassium: 3.7 mmol/L (ref 3.5–5.1)
Sodium: 140 mmol/L (ref 135–145)

## 2022-03-13 NOTE — Telephone Encounter (Addendum)
Spoke with patient to review lab results below. Patient verbalized understanding and stated she will continue to stay off of jardiance. She states she had no further questions or concerns at this time. Suanne Marker, RN  ----- Message from Delma Freeze, FNP sent at 03/13/2022 11:16 AM EDT ----- Kidney function is improving since jardiance was stopped. Continue off of it for now

## 2022-03-18 DIAGNOSIS — K219 Gastro-esophageal reflux disease without esophagitis: Secondary | ICD-10-CM | POA: Diagnosis not present

## 2022-03-18 DIAGNOSIS — E119 Type 2 diabetes mellitus without complications: Secondary | ICD-10-CM | POA: Diagnosis not present

## 2022-03-18 DIAGNOSIS — Z6830 Body mass index (BMI) 30.0-30.9, adult: Secondary | ICD-10-CM | POA: Diagnosis not present

## 2022-03-18 DIAGNOSIS — Z5982 Transportation insecurity: Secondary | ICD-10-CM | POA: Diagnosis not present

## 2022-03-18 DIAGNOSIS — R609 Edema, unspecified: Secondary | ICD-10-CM | POA: Diagnosis not present

## 2022-03-18 DIAGNOSIS — I10 Essential (primary) hypertension: Secondary | ICD-10-CM | POA: Diagnosis not present

## 2022-03-18 DIAGNOSIS — E611 Iron deficiency: Secondary | ICD-10-CM | POA: Diagnosis not present

## 2022-03-18 DIAGNOSIS — G4700 Insomnia, unspecified: Secondary | ICD-10-CM | POA: Diagnosis not present

## 2022-03-18 DIAGNOSIS — I4891 Unspecified atrial fibrillation: Secondary | ICD-10-CM | POA: Diagnosis not present

## 2022-03-18 DIAGNOSIS — J45909 Unspecified asthma, uncomplicated: Secondary | ICD-10-CM | POA: Diagnosis not present

## 2022-03-18 DIAGNOSIS — E669 Obesity, unspecified: Secondary | ICD-10-CM | POA: Diagnosis not present

## 2022-03-18 DIAGNOSIS — M1990 Unspecified osteoarthritis, unspecified site: Secondary | ICD-10-CM | POA: Diagnosis not present

## 2022-03-21 ENCOUNTER — Encounter: Payer: Self-pay | Admitting: Internal Medicine

## 2022-03-21 ENCOUNTER — Telehealth: Payer: Self-pay

## 2022-03-21 ENCOUNTER — Ambulatory Visit (INDEPENDENT_AMBULATORY_CARE_PROVIDER_SITE_OTHER): Payer: Medicare HMO | Admitting: Internal Medicine

## 2022-03-21 VITALS — BP 122/70 | HR 62 | Temp 98.4°F | Ht 60.98 in | Wt 160.8 lb

## 2022-03-21 DIAGNOSIS — E1159 Type 2 diabetes mellitus with other circulatory complications: Secondary | ICD-10-CM

## 2022-03-21 DIAGNOSIS — Z1211 Encounter for screening for malignant neoplasm of colon: Secondary | ICD-10-CM | POA: Diagnosis not present

## 2022-03-21 DIAGNOSIS — D649 Anemia, unspecified: Secondary | ICD-10-CM

## 2022-03-21 DIAGNOSIS — Z1231 Encounter for screening mammogram for malignant neoplasm of breast: Secondary | ICD-10-CM | POA: Diagnosis not present

## 2022-03-21 DIAGNOSIS — I152 Hypertension secondary to endocrine disorders: Secondary | ICD-10-CM

## 2022-03-21 DIAGNOSIS — E119 Type 2 diabetes mellitus without complications: Secondary | ICD-10-CM | POA: Diagnosis not present

## 2022-03-21 DIAGNOSIS — Z13820 Encounter for screening for osteoporosis: Secondary | ICD-10-CM | POA: Diagnosis not present

## 2022-03-21 LAB — CBC WITH DIFFERENTIAL/PLATELET
Hematocrit: 35.3 % (ref 34.0–46.6)
Hemoglobin: 11.7 g/dL (ref 11.1–15.9)
Lymphocytes Absolute: 1.9 10*3/uL (ref 0.7–3.1)
Lymphs: 33 %
MCH: 26.3 pg — ABNORMAL LOW (ref 26.6–33.0)
MCHC: 33.1 g/dL (ref 31.5–35.7)
MCV: 79 fL (ref 79–97)
MID (Absolute): 0.3 10*3/uL (ref 0.1–1.6)
MID: 6 %
Neutrophils Absolute: 3.4 10*3/uL (ref 1.4–7.0)
Neutrophils: 61 %
Platelets: 206 10*3/uL (ref 150–450)
RBC: 4.45 x10E6/uL (ref 3.77–5.28)
RDW: 19.3 % — ABNORMAL HIGH (ref 11.7–15.4)
WBC: 5.6 10*3/uL (ref 3.4–10.8)

## 2022-03-21 MED ORDER — EMPAGLIFLOZIN 10 MG PO TABS: 10.0000 mg | ORAL_TABLET | Freq: Every day | ORAL | 5 refills | Status: DC

## 2022-03-21 NOTE — Patient Instructions (Signed)
Please call to schedule your mammogram and/or bone density: ?[facility]  ?Address: [address], [place],  [address] ?Phone: [phone]  ?

## 2022-03-21 NOTE — Telephone Encounter (Signed)
atient will call us when she is ready to schedule wanted to be taken of list in the mean time so closed referral    Patient will call us when she is ready to schedule wanted to be taken of list in the mean time so closed referral

## 2022-03-21 NOTE — Progress Notes (Signed)
BP 122/70   Pulse 62   Temp 98.4 F (36.9 C) (Oral)   Ht 5' 0.98" (1.549 m)   Wt 160 lb 12.8 oz (72.9 kg)   SpO2 99%   BMI 30.40 kg/m    Subjective:    Patient ID: Amanda [name], female    DOB: May 03, 1956, 66 y.o.   MRN: [id number]  Chief Complaint  Patient presents with   Diabetes   Hypertension   Paroxysmal Atril fib    HPI: Amanda [name] is a 67 y.o. female  Diabetes She presents for her follow-up diabetic visit. She has type 2 diabetes mellitus.  Hypertension  Congestive Heart Failure   Chief Complaint  Patient presents with   Diabetes   Hypertension   Paroxysmal Atril fib    Relevant past medical, surgical, family and social history reviewed and updated as indicated. Interim medical history since our last visit reviewed. Allergies and medications reviewed and updated.  Review of Systems  Per HPI unless specifically indicated above     Objective:    BP 122/70   Pulse 62   Temp 98.4 F (36.9 C) (Oral)   Ht 5' 0.98" (1.549 m)   Wt 160 lb 12.8 oz (72.9 kg)   SpO2 99%   BMI 30.40 kg/m   Wt Readings from Last 3 Encounters:  03/21/22 160 lb 12.8 oz (72.9 kg)  02/18/22 156 lb 6 oz (70.9 kg)  02/13/22 151 lb 12.8 oz (68.9 kg)    Physical Exam  Results for orders placed or performed during the hospital encounter of 03/13/22  Basic metabolic panel  Result Value Ref Range   Sodium 140 135 - 145 mmol/L   Potassium 3.7 3.5 - 5.1 mmol/L   Chloride 103 98 - 111 mmol/L   CO2 29 22 - 32 mmol/L   Glucose, Bld 149 (H) 70 - 99 mg/dL   BUN 20 8 - 23 mg/dL   Creatinine, Ser 6.57 (H) 0.44 - 1.00 mg/dL   Calcium 9.3 8.9 - 84.6 mg/dL   GFR, Estimated 55 (L) >60 mL/min   Anion gap 8 5 - 15        Current Outpatient Medications:    albuterol (VENTOLIN HFA) 108 (90 Base) MCG/ACT inhaler, Inhale 2 puffs into the lungs every 6 (six) hours as needed for wheezing or shortness of breath., Disp: 8 g, Rfl: 0   amiodarone (PACERONE) 200 MG tablet, Take 200 mg by mouth  2 (two) times daily., Disp: , Rfl:    apixaban (ELIQUIS) 5 MG TABS tablet, Take 5 mg by mouth 2 (two) times daily., Disp: , Rfl:    Calcium Carb-Cholecalciferol (CALCIUM 600+D3) 600-200 MG-UNIT TABS, Take 1 tablet by mouth in the morning., Disp: , Rfl:    carvedilol (COREG) 12.5 MG tablet, Take 1 tablet (12.5 mg total) by mouth 2 (two) times daily with a meal., Disp: 180 tablet, Rfl: 1   Dulaglutide (TRULICITY) 3 MG/0.5ML SOPN, Inject 3 mg as directed once a week. (Patient taking differently: Inject 3 mg as directed every Monday.), Disp: 6 mL, Rfl: 3   ferrous sulfate 325 (65 FE) MG tablet, Take 1 tablet (325 mg total) by mouth daily., Disp: 30 tablet, Rfl: 3   furosemide (LASIX) 40 MG tablet, Take 40mg  twice daily for 4 days, then take 40mg  once daily. (Patient taking differently: Take 40 mg by mouth daily.), Disp: 60 tablet, Rfl: 2   Multiple Vitamin (MULTIVITAMIN) tablet, Take 1 tablet by mouth daily., Disp: , Rfl:  omeprazole (PRILOSEC) 20 MG capsule, Take 20 mg by mouth daily., Disp: , Rfl:    potassium chloride SA (KLOR-CON M) 20 MEQ tablet, Take 1 tablet (20 mEq total) by mouth daily., Disp: 30 tablet, Rfl: 2   traZODone (DESYREL) 50 MG tablet, Take 1 tablet (50 mg total) by mouth at bedtime., Disp: 30 tablet, Rfl: 0   vitamin B-12 (CYANOCOBALAMIN) 500 MCG tablet, Take 500 mcg by mouth daily., Disp: , Rfl:     Assessment & Plan:  DM is on trulicity 3 mg q weekly, fsbs 157 a1c at 6.9 restart jardiace - 10 mg. Was stopped sec to ? Increased creatnine howeever will restart given sugars higher and proven renoprotection from SGLTs check HbA1c,  urine  microalbumin  diabetic diet plan given to pt  adviced regarding hypoglycemia and instructions given to pt today on how to prevent and treat the same if it were to occur. pt acknowledges the plan and voices understanding of the same.  exercise plan given and encouraged.   advice diabetic yearly podiatry, ophthalmology , nutritionist , dental check q  6 months,  2. HTN Continue current meds.  Medication compliance emphasised. pt advised to keep Bp logs. Pt verbalised understanding of the same. Pt to have a low salt diet . Exercise to reach a goal of at least 150 mins a week.  lifestyle modifications explained and pt understands importance of the above. Under good control on current regimen. Continue current regimen. Continue to monitor. Call with any concerns. Refills given. Labs drawn today.   3. CHF : Sees CHF clinic. Fu and m per such pt is asymptomatic Pt would be ok to go back to work pending cardiac clearance.   Problem List Items Addressed This Visit   None    No orders of the defined types were placed in this encounter.    No orders of the defined types were placed in this encounter.    Follow up plan: No follow-ups on file.  Health Maintenance : Mammogram: declines never had one.   Paps smear: DEXA: 2022 Cscope : Pneumonia vaccine : prenvanar / pneumovax  FLu vaccine :  Labs next visit : CBC, CMP, FLP, HBA1C, TSH, PSA, urine microalbumin Labs 1 week prior to next visit.

## 2022-04-01 ENCOUNTER — Telehealth: Payer: Self-pay | Admitting: Internal Medicine

## 2022-04-01 NOTE — Telephone Encounter (Signed)
Patient brought in Staff Health Assessment /Medical Report form that needs to be completed by the provider. Form was placed in the providers folder for completion. Please call patient when form is ready to be picked up.

## 2022-04-04 NOTE — Telephone Encounter (Signed)
Pt requested a call back with the status of the paperwork she dropped off, please advise.

## 2022-04-04 NOTE — Telephone Encounter (Signed)
Informed patient that her paperwork to return to work has been completed and at front desk waiting for her to pick up

## 2022-04-12 NOTE — Progress Notes (Signed)
Chronic Care Management Pharmacy Note  04/15/2022 Name:  Amanda [name] MRN:  [id number] DOB:  05/19/1956  Summary: Today CKD/DM. Scr fluctuations, most recent 1.1, gfr 55 5/24, whichs is pretty consistent with previous 2 years. Started back on jardiance 6/1 PCP visit, sees Malachy Mood 06/2022 CHF. Doing well with low salt diet and daily weights - 151 lb baseline, denies 2 lb weight increases over night, understands to take furosemide 40 mg as needed if this occurs, new daily dose is 20 mg  129-149 - FBGs recently Reviewed PAP for Jardiance and Eliquis should qualify, will send applications Requesting rx for potassium and Fe as these were started at hospital  - cc'd chart sent  12/2021 visit BP this am 123/73 01/14/22 tel appt. Is not taking synjardy, has started back on trulicity. Home FBG 191, 218, 176, 154.  Scr improved and stable. Could consider restart on sglt2 as appropriate. With history of lactic acidosis, would avoid metformin (avoided regardless of precipitating factors to lactic acidosis). Recommend jardiance 10 mg - next pcp visit 01/22/22. Denies using lovastatin - was also stopped at hospital d/c 10/2021. Would recommend restarting statin. Some potential for interaction b/w lovastatin-cardizem - increases lovastatin dose. consider rosuvastatin 20 mg once daily as appropriate  HF clinic visit 01/16/22  Subjective: Amanda [name] is an 66 y.o. year old female who is a primary patient of Vigg, [name], MD.  The CCM team was consulted for assistance with disease management and care coordination needs.    Engaged with patient by telephone for follow up visit in response to provider referral for pharmacy case management and/or care coordination services.   Consent to Services:  The patient was given information about Chronic Care Management services, agreed to services, and gave verbal consent prior to initiation of services.  Please see initial visit note for detailed documentation.   Patient  Care Team: Charlynne Cousins, MD as PCP - Glory Buff, [facility] as Pharmacist ([place])  Hospital visits: None in previous 6 months  Objective:  Lab Results  Component Value Date   CREATININE 1.10 (H) 03/13/2022   CREATININE 1.32 (H) 02/27/2022   CREATININE 1.27 (H) 02/18/2022    Lab Results  Component Value Date   HGBA1C 6.9 (H) 01/22/2022   HGBA1C 7.0 (H) 11/22/2021   HGBA1C 6.9 (H) 11/05/2021   Last diabetic Eye exam: No results found for: "HMDIABEYEEXA"  Last diabetic Foot exam: No results found for: "HMDIABFOOTEX"      Component Value Date/Time   CHOL 142 08/30/2021 0822   CHOL 148 04/11/2021 0807   CHOL 154 12/25/2020 1512   CHOL 196 07/16/2017 1422   CHOL 148 09/16/2016 1558   CHOL 178 12/07/2015 1356   TRIG 163 (H) 08/30/2021 0822   TRIG 139 04/11/2021 0807   TRIG 176 (H) 12/25/2020 1512   TRIG 301 (H) 07/16/2017 1422   TRIG 199 (H) 09/16/2016 1558   TRIG 232 (H) 12/07/2015 1356   HDL 50 08/30/2021 0822   HDL 54 04/11/2021 0807   HDL 53 12/25/2020 1512   CHOLHDL 2.8 08/30/2021 0822   VLDL 60 (H) 07/16/2017 1422   LDLCALC 64 08/30/2021 0822   LDLCALC 70 04/11/2021 0807   LDLCALC 71 12/25/2020 1512       Latest Ref Rng & Units 01/22/2022    3:22 PM 01/04/2022    6:31 PM 11/22/2021    3:51 PM  Hepatic Function  Total Protein 6.0 - 8.5 g/dL 7.1  7.8  5.9  Albumin 3.8 - 4.8 g/dL 3.9  3.3  3.0   AST 0 - 40 IU/L 19  22  19    ALT 0 - 32 IU/L 10  12  16    Alk Phosphatase 44 - 121 IU/L 111  83  110   Total Bilirubin 0.0 - 1.2 mg/dL 0.3  0.7  0.4     Lab Results  Component Value Date/Time   TSH 3.901 01/04/2022 06:31 PM   TSH 1.640 08/30/2021 08:22 AM   TSH 2.130 09/05/2020 04:15 PM       Latest Ref Rng & Units 03/21/2022    9:23 AM 01/22/2022    3:22 PM 01/08/2022    4:29 AM  CBC  WBC 3.4 - 10.8 x10E3/uL 5.6  7.1  6.6   Hemoglobin 11.1 - 15.9 g/dL 11.7  9.3  8.8   Hematocrit 34.0 - 46.6 % 35.3  29.1  29.1   Platelets 150 - 450 x10E3/uL 206   372  209     No results found for: "VD25OH"  Clinical ASCVD:  The 10-year ASCVD risk score (Arnett DK, et al., 2019) is: 12.5%   Values used to calculate the score:     Age: 37 years     Sex: Female     Is Non-Hispanic African American: No     Diabetic: Yes     Tobacco smoker: No     Systolic Blood Pressure: 076 mmHg     Is BP treated: Yes     HDL Cholesterol: 50 mg/dL     Total Cholesterol: 142 mg/dL   Social History   Tobacco Use  Smoking Status Former   Types: Cigarettes   Quit date: 12/07/1975   Years since quitting: 46.3  Smokeless Tobacco Never   BP Readings from Last 3 Encounters:  03/21/22 122/70  02/18/22 (!) 146/72  02/13/22 (!) 151/65   Pulse Readings from Last 3 Encounters:  03/21/22 62  02/18/22 61  02/13/22 (!) 59   Wt Readings from Last 3 Encounters:  03/21/22 160 lb 12.8 oz (72.9 kg)  02/18/22 156 lb 6 oz (70.9 kg)  02/13/22 151 lb 12.8 oz (68.9 kg)   BMI Readings from Last 3 Encounters:  03/21/22 30.40 kg/m  02/18/22 29.55 kg/m  02/13/22 28.68 kg/m    Assessment: Review of patient past medical history, allergies, medications, health status, including review of consultants reports, laboratory and other test data, was performed as part of comprehensive evaluation and provision of chronic care management services.   SDOH:  (Social Determinants of Health) assessments and interventions performed: Yes SDOH Interventions    Flowsheet Row Most Recent Value  SDOH Interventions   Food Insecurity Interventions Intervention Not Indicated  Transportation Interventions Intervention Not Indicated      Care Gaps - needs MCR, foot exam, colonoscopy   [facility] - no gaps for trulicity, there was a gap noted for Jardiance but had been advised to hold. Statin stopped in hosp. SDOH Interventions    Flowsheet Row Most Recent Value  SDOH Interventions   Food Insecurity Interventions Intervention Not Indicated  Transportation Interventions Intervention  Not Indicated       CCM Care Plan  Allergies  Allergen Reactions   Tramadol Other (See Comments)    "woozy"   Penicillins Other (See Comments)    Dizziness Tolerated augmentin but does not like taste    Medications Reviewed Today     Reviewed by Charlynne Cousins, MD (Physician) on 03/21/22 at [phone]  Med List  Status: <None>   Medication Order Taking? Sig Documenting Provider Last Dose Status Informant  albuterol (VENTOLIN HFA) 108 (90 Base) MCG/ACT inhaler [id number] Yes Inhale 2 puffs into the lungs every 6 (six) hours as needed for wheezing or shortness of breath. Charlynne Cousins, MD Taking Active Self  amiodarone (PACERONE) 200 MG tablet [id number] Yes Take 200 mg by mouth 2 (two) times daily. [provider] Taking Active Self  apixaban (ELIQUIS) 5 MG TABS tablet [id number] Yes Take 5 mg by mouth 2 (two) times daily. [provider] Taking Active Self  Calcium Carb-Cholecalciferol (CALCIUM 600+D3) 600-200 MG-UNIT TABS [id number] Yes Take 1 tablet by mouth in the morning. [provider] Taking Active Self  carvedilol (COREG) 12.5 MG tablet [id number] Yes Take 1 tablet (12.5 mg total) by mouth 2 (two) times daily with a meal. Wynetta Emery, Megan P, DO Taking Active Self  Dulaglutide (TRULICITY) 3 FU/9.3AT SOPN [id number] Yes Inject 3 mg as directed once a week.  Patient taking differently: Inject 3 mg as directed every Monday.   Charlynne Cousins, MD Taking Active Self  empagliflozin (JARDIANCE) 10 MG TABS tablet [id number]  Take 1 tablet (10 mg total) by mouth daily before breakfast. Vigg, [name], MD  Active   ferrous sulfate 325 (65 FE) MG tablet [id number] Yes Take 1 tablet (325 mg total) by mouth daily. Bonnielee Haff, MD Taking Active Self  furosemide (LASIX) 40 MG tablet [id number] Yes Take 61m twice daily for 4 days, then take 424monce daily.  Patient taking differently: Take 40 mg by mouth daily.   KrBonnielee HaffMD Taking Active Self  Multiple Vitamin (MULTIVITAMIN)  tablet [id number] Take 1 tablet by mouth daily. [provider] Taking Active Self  omeprazole (PRILOSEC) 20 MG capsule [id number] Take 20 mg by mouth daily. [provider] Taking Active Self  potassium chloride SA (KLOR-CON M) 20 MEQ tablet [id number] Take 1 tablet (20 mEq total) by mouth daily. KrBonnielee HaffMD Taking Active Self  traZODone (DESYREL) 50 MG tablet [id number] Take 1 tablet (50 mg total) by mouth at bedtime. WiLoletha GrayerMD Taking Active Self  vitamin B-12 (CYANOCOBALAMIN) 500 MCG tablet [id number] Take 500 mcg by mouth daily. [provider] Taking Active Self            Patient Active Problem List   Diagnosis Date Noted   Anemia 03/21/2022   Screen for colon cancer 03/21/2022   Acute on chronic congestive heart failure (HCAdin04/01/2022   History of cholecystitis    Normocytic anemia 01/05/2022   Hypokalemia 01/05/2022   Cholecystostomy care (HCDola03/17/2023   Acute dyspnea 01/04/2022   Bilateral pleural effusion 01/04/2022   Multiple pulmonary nodules determined by computed tomography of lung 01/04/2022   Hypertensive emergency    Acute on chronic diastolic CHF (congestive heart failure) (HCManata   Chronic anticoagulation    CAP (community acquired pneumonia)    Acute respiratory failure with hypoxia (HCC)    AF (paroxysmal atrial fibrillation) (HCTroy01/17/2023   Pancytopenia (HCPort [place]01/17/2023   Demand ischemia (HCFontana Dam01/17/2023   Cholecystitis    Diabetes mellitus without complication (HCMondovi1177/82/4235 B12 deficiency 12/26/2020   Obesity 03/01/2020   Insomnia 11/30/2019   GERD (gastroesophageal reflux disease) 11/30/2019   Uncontrolled type 2 diabetes mellitus with hyperglycemia, without long-term current use of insulin (HCPunta Santiago11/28/2020   CKD (chronic kidney disease) stage 3, GFR 30-59 ml/min (HCC)    Hyperlipidemia associated with type 2 diabetes mellitus (HCOak Grove  Hypertension associated with diabetes ([place])      Immunization History  Administered Date(s) Administered   Fluad Quad(high Dose 65+) 09/03/2021   Influenza,inj,Quad PF,6+ Mos 09/16/2016, 07/16/2017, 07/22/2018, 08/18/2019, 08/01/2020   Moderna Sars-Covid-2 Vaccination 12/18/2019, 01/15/2020, 10/16/2020   Pneumococcal Conjugate-13 12/25/2020   Pneumococcal Polysaccharide-23 10/26/2002   Tdap 03/16/2013    Conditions to be addressed/monitored: CHF HLD CHF HTN Afib  Care Plan : [place]  Updates made by Madelin Rear, [facility] since 04/15/2022 12:00 AM     Problem: Diabetes, HTN, CKD, HLD, GERD, Obesity, CHF   Priority: High     Long-Range Goal: Disease Management   Start Date: 01/14/2022  Recent Progress: Not on track  Priority: High  Note:    Current Barriers:  Unable to maintain control of CHF HLD HTN DM  Pharmacist Clinical Goal(s):  Patient will verbalize ability to afford treatment regimen through collaboration with PharmD and provider.   Interventions: 1:1 collaboration with Charlynne Cousins, MD regarding development and update of comprehensive plan of care as evidenced by provider attestation and co-signature Inter-disciplinary care team collaboration (see longitudinal plan of care) Comprehensive medication review performed; medication list updated in electronic medical record  Diabetes (A1c goal <7%) --Now controlled -hx of AKI scr as low as 2.89 10/2021 hosp -> dropped to 0.79. this was secondary to sepsis. started back on trulicity. Synjardy stopped in the acute setting - aki, lactic acidosis (hypoxic). Can afford trulicity copay at this time, will let us know if cost becomes an issue. Scr improved and stable. With history of lactic acidosis, would avoid metformin regardless of precipitating factors.  -GFRs >60. Most recent a1c 7 11/22/21 -s/p cholecystectomy 12/2021 -Current medications: Trulicity 90m weekly Appropriate, Effective, Safe, Accessible Jardiance 10 mg once daily  Appropriate, Effective, Safe,  Accessible -Medications previously tried: glimepiride, januvia, onglyza. Most recently stopped synjardy - reports being stopped at hospital stay  -Current home glucose readings fasting glucose: did not test today, normally. 191, 218, 176, 154. June 2023: 129-149 post prandial glucose: not checking -Denies hypoglycemic/hyperglycemic symptoms -Current meal patterns: watching carbs, not adding salt. Just had gallbladder removed -Current exercise: no formal exercise  June 2023. 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 -Educated on A1c and blood sugar goals; Benefits of routine self-monitoring of blood sugar; -Counseled to check feet daily and get yearly eye exams -Counseled on diet and exercise extensively Recommended to continue current medication  Hyperlipidemia: (LDL goal < 70) -Controlled -HTN, DM. ASCVD 10 yr risk - intermediate. Sister had MI per family Hx.  -Mild TG elevations.  -LDL controlled in 60s 08/2021 -Current treatment: lovastatin stopped 10/2021 hosp. Not currently taking any.  -Medications previously tried: n/a- had been on 80 mg daily of lovastatin.  -Current dietary patterns: see dm -Current exercise habits: see dm -Educated on Cholesterol goals;  Benefits of statin for ASCVD risk reduction; -recommend statin restart w/ crestor 10 mg  Afib and Heart Failure (Goal: manage symptoms and prevent exacerbations) -Not ideally controlled -Afib recently noted in setting of septic shock - AC with eliquis 5 mg BID + cardizem cd 180 mg daily -Has had lower K+, most recent 3.8 in hosp.  [place]im[place]HF clinic 01/16/22 as new pt. -Last ejection fraction: 50-55% (Date: 10/2021). Last BNP 538 01/04/22 in hosp.  -HF type: Diastolic -NYHA Class: III -AHA HF Stage: B (Heart disease present - no symptoms present) -Current treatment: Amiodarone 200 mg daily Appropriate, Effective, Safe, Accessible Coreg 12.5 mg  BID Appropriate, Effective, Safe,  Accessible Furosemide 20 mg daily  Take potassium 20 meq with lasix Appropriate, Effective, Safe, Accessible Eliquis 5 mg twice daily  Appropriate, Effective, Safe, Query accessible -Medications previously tried: previously on ramipril, also stopped 10/2021 w/ AKI. Was previously on cardizem.  -Current home BP/HR readings: SBP <130 -Current dietary habits: low carb low salt -Current exercise habits: n/a -Educated on Importance of weighing daily; if you gain more than 2 pounds in one day or 5 pounds in one week, contact provider or utilize action plan per cardiology -Counseled on diet and exercise extensively Recommended to continue current medication PAP - will attempt eliquis pap with patient   Patient Goals/Self-Care Activities Patient will:  - take medications as prescribed as evidenced by patient report and record review  Medication Assistance: Will send PAP applications for Jardiance and Eliquis - should qualify based off our review.      Patient's preferred pharmacy is:  [facility] [address], [place] - [place] [address] Phone: [phone] Fax: [phone]  [place] [address] Phone: [phone] Fax: [place], [place] [address] Phone: [phone] Fax: [phone]  Follow Up:  Patient agrees to Care Plan and Follow-up. Plan: HC PRN. Pharmacist [id number]  Future Appointments  Date Time Provider DJarratt 05/21/2022  1:30 PM HAlisa Graff FNP [facility] None  06/17/2022 10:00 AM CFP CCM PHARMACY [name]  06/25/2022  9:00 AM WKathrine Haddock NP [name]   JMadelin Rear PharmD, BSurgicenter Of [facility] Dba Vidant SurgicenterClinical Pharmacist  [facility] [phone]

## 2022-04-15 ENCOUNTER — Ambulatory Visit (INDEPENDENT_AMBULATORY_CARE_PROVIDER_SITE_OTHER): Payer: Medicare HMO

## 2022-04-15 DIAGNOSIS — I480 Paroxysmal atrial fibrillation: Secondary | ICD-10-CM

## 2022-04-15 DIAGNOSIS — I152 Hypertension secondary to endocrine disorders: Secondary | ICD-10-CM

## 2022-04-15 DIAGNOSIS — E1165 Type 2 diabetes mellitus with hyperglycemia: Secondary | ICD-10-CM

## 2022-04-15 DIAGNOSIS — E119 Type 2 diabetes mellitus without complications: Secondary | ICD-10-CM

## 2022-04-15 DIAGNOSIS — N182 Chronic kidney disease, stage 2 (mild): Secondary | ICD-10-CM

## 2022-04-15 DIAGNOSIS — E1169 Type 2 diabetes mellitus with other specified complication: Secondary | ICD-10-CM

## 2022-04-19 ENCOUNTER — Telehealth: Payer: Self-pay

## 2022-04-19 ENCOUNTER — Other Ambulatory Visit: Payer: Self-pay | Admitting: Internal Medicine

## 2022-04-19 DIAGNOSIS — E785 Hyperlipidemia, unspecified: Secondary | ICD-10-CM

## 2022-04-19 DIAGNOSIS — I4891 Unspecified atrial fibrillation: Secondary | ICD-10-CM

## 2022-04-19 DIAGNOSIS — E1159 Type 2 diabetes mellitus with other circulatory complications: Secondary | ICD-10-CM

## 2022-04-19 DIAGNOSIS — D649 Anemia, unspecified: Secondary | ICD-10-CM | POA: Diagnosis not present

## 2022-04-19 NOTE — Chronic Care Management (AMB) (Signed)
    Chronic Care Management Pharmacy Assistant   Name: JONELL BRUMBAUGH  MRN: [id number] DOB: November 06, 1955  Reason for Encounter: Patient assistance applications for Jardiance and Eliquis   Medications: Outpatient Encounter Medications as of 04/19/2022  Medication Sig   albuterol (VENTOLIN HFA) 108 (90 Base) MCG/ACT inhaler Inhale 2 puffs into the lungs every 6 (six) hours as needed for wheezing or shortness of breath.   amiodarone (PACERONE) 200 MG tablet Take 200 mg by mouth 2 (two) times daily.   apixaban (ELIQUIS) 5 MG TABS tablet Take 5 mg by mouth 2 (two) times daily.   Calcium Carb-Cholecalciferol (CALCIUM 600+D3) 600-200 MG-UNIT TABS Take 1 tablet by mouth in the morning.   carvedilol (COREG) 12.5 MG tablet Take 1 tablet (12.5 mg total) by mouth 2 (two) times daily with a meal.   empagliflozin (JARDIANCE) 10 MG TABS tablet Take 1 tablet (10 mg total) by mouth daily before breakfast.   ferrous sulfate 325 (65 FE) MG tablet Take 1 tablet (325 mg total) by mouth daily.   furosemide (LASIX) 40 MG tablet Take 40mg  twice daily for 4 days, then take 40mg  once daily. (Patient taking differently: Take 20 mg by mouth daily.)   Multiple Vitamin (MULTIVITAMIN) tablet Take 1 tablet by mouth daily.   omeprazole (PRILOSEC) 20 MG capsule Take 20 mg by mouth daily.   potassium chloride SA (KLOR-CON M) 20 MEQ tablet Take 1 tablet (20 mEq total) by mouth daily.   traZODone (DESYREL) 50 MG tablet Take 1 tablet (50 mg total) by mouth at bedtime.   vitamin B-12 (CYANOCOBALAMIN) 500 MCG tablet Take 500 mcg by mouth daily.   No facility-administered encounter medications on file as of 04/19/2022.    I have prefilled and mailed out the patient assistance application for Eliquis and Jardiance I have left detailed note for the patient to sign all highlighted areas of the form and once completed to bring back to the office with my contact information in case of any questions.  , [facility]  Concierge

## 2022-04-21 LAB — FECAL OCCULT BLOOD, IMMUNOCHEMICAL: Fecal Occult Bld: NEGATIVE

## 2022-04-21 NOTE — Progress Notes (Signed)
Contacted via MyChart   Good morning Amanda [name], your stool sample returned negative for blood.  Good news.  Please ensure to schedule with GI as [name] recommended.  Any questions?

## 2022-04-24 NOTE — Telephone Encounter (Signed)
Patient returning call back to Chronic care about papwework. Please call back

## 2022-04-24 NOTE — Progress Notes (Signed)
I have returned patient's phone call, patient had questions on the form of the income section which she didn't know to include only her income or everyone in the house hold she stated the Gerilyn Pilgrim mentioned to only include her income information. I stated for her to follow the guidelines of what jacob stated. Patient also states she has dropped off a copy of her social security at the office already which will be need to be faxed with the applications that the patient will return tomorrow. Patient understood and is clear on all the directions.  Rance Muir, [facility] Concierge

## 2022-04-25 ENCOUNTER — Telehealth: Payer: Self-pay | Admitting: Family

## 2022-04-25 NOTE — Telephone Encounter (Signed)
Patient brought in additional paperwork  for Patient assistance. Paperwork was placed in the [employer] for  [employer].

## 2022-05-17 ENCOUNTER — Encounter: Payer: Self-pay | Admitting: Family

## 2022-05-21 ENCOUNTER — Encounter: Payer: Self-pay | Admitting: Family

## 2022-05-21 ENCOUNTER — Ambulatory Visit: Payer: Medicare HMO | Attending: Family | Admitting: Family

## 2022-05-21 VITALS — BP 149/61 | HR 63 | Resp 16 | Ht 61.0 in | Wt 164.0 lb

## 2022-05-21 DIAGNOSIS — Z7901 Long term (current) use of anticoagulants: Secondary | ICD-10-CM | POA: Diagnosis not present

## 2022-05-21 DIAGNOSIS — I480 Paroxysmal atrial fibrillation: Secondary | ICD-10-CM

## 2022-05-21 DIAGNOSIS — I5032 Chronic diastolic (congestive) heart failure: Secondary | ICD-10-CM | POA: Diagnosis not present

## 2022-05-21 DIAGNOSIS — K219 Gastro-esophageal reflux disease without esophagitis: Secondary | ICD-10-CM | POA: Diagnosis not present

## 2022-05-21 DIAGNOSIS — E119 Type 2 diabetes mellitus without complications: Secondary | ICD-10-CM

## 2022-05-21 DIAGNOSIS — E785 Hyperlipidemia, unspecified: Secondary | ICD-10-CM | POA: Diagnosis not present

## 2022-05-21 DIAGNOSIS — I4891 Unspecified atrial fibrillation: Secondary | ICD-10-CM | POA: Diagnosis not present

## 2022-05-21 DIAGNOSIS — F419 Anxiety disorder, unspecified: Secondary | ICD-10-CM | POA: Insufficient documentation

## 2022-05-21 DIAGNOSIS — I10 Essential (primary) hypertension: Secondary | ICD-10-CM | POA: Diagnosis not present

## 2022-05-21 DIAGNOSIS — R69 Illness, unspecified: Secondary | ICD-10-CM | POA: Diagnosis not present

## 2022-05-21 DIAGNOSIS — I110 Hypertensive heart disease with heart failure: Secondary | ICD-10-CM | POA: Insufficient documentation

## 2022-05-21 MED ORDER — POTASSIUM CHLORIDE CRYS ER 20 MEQ PO TBCR: 20.0000 meq | EXTENDED_RELEASE_TABLET | Freq: Every day | ORAL | 5 refills | Status: DC

## 2022-05-21 MED ORDER — SACUBITRIL-VALSARTAN 24-26 MG PO TABS: 1.0000 | ORAL_TABLET | Freq: Two times a day (BID) | ORAL | 3 refills | Status: DC
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
## 2022-05-21 NOTE — Patient Instructions (Addendum)
Continue weighing daily and call for an overnight weight gain of 3 pounds or more or a weekly weight gain of more than 5 pounds.   If you have voicemail, please make sure your mailbox is cleaned out so that we may leave a message and please make sure to listen to any voicemails.    Start taking entresto as 1 tablet every morning and another tablet every evening

## 2022-05-22 ENCOUNTER — Other Ambulatory Visit: Payer: Self-pay | Admitting: Family

## 2022-05-22 MED ORDER — SACUBITRIL-VALSARTAN 24-26 MG PO TABS: 1.0000 | ORAL_TABLET | Freq: Two times a day (BID) | ORAL | 3 refills | Status: DC

## 2022-05-23 ENCOUNTER — Other Ambulatory Visit: Payer: Self-pay | Admitting: Family Medicine

## 2022-05-23 NOTE — Telephone Encounter (Signed)
Requested medication (s) are due for refill today: yes  Requested medication (s) are on the active medication list: yes  Last refill:  05/13/22   Future visit scheduled: yes  Notes to clinic:  historical med. Please advise      Requested Prescriptions  Pending Prescriptions Disp Refills   ELIQUIS 5 MG TABS tablet [Pharmacy Med Name: Eliquis 5 MG Oral Tablet] 180 tablet 0    Sig: Take 1 tablet by mouth twice daily     Hematology:  Anticoagulants - apixaban Failed - 05/23/2022 10:07 AM      Failed - Cr in normal range and within 360 days    Creatinine, Ser  Date Value Ref Range Status  03/13/2022 1.10 (H) 0.44 - 1.00 mg/dL Final         Passed - PLT in normal range and within 360 days    Platelets  Date Value Ref Range Status  03/21/2022 206 150 - 450 x10E3/uL Final         Passed - HGB in normal range and within 360 days    Hemoglobin  Date Value Ref Range Status  03/21/2022 11.7 11.1 - 15.9 g/dL Final         Passed - HCT in normal range and within 360 days    Hematocrit  Date Value Ref Range Status  03/21/2022 35.3 34.0 - 46.6 % Final         Passed - AST in normal range and within 360 days    AST  Date Value Ref Range Status  01/22/2022 19 0 - 40 IU/L Final   AST (SGOT) Piccolo, Waived  Date Value Ref Range Status  07/16/2017 28 11 - 38 U/L Final         Passed - ALT in normal range and within 360 days    ALT  Date Value Ref Range Status  01/22/2022 10 0 - 32 IU/L Final   ALT (SGPT) Piccolo, Waived  Date Value Ref Range Status  07/16/2017 33 10 - 47 U/L Final         Passed - Valid encounter within last 12 months    Recent Outpatient Visits           2 months ago Hypertension associated with diabetes (HCC)   [name], [name], MD   4 months ago Cholecystitis   [name], [name], MD   4 months ago Cough, unspecified type   [facility] Vigg, [name], MD   6 months ago Diabetes mellitus without  complication (HCC)   [name], [name], MD   8 months ago Need for influenza vaccination   [name], [name], MD       Future Appointments             In 1 month Gabriel Cirri, NP [facility], [name]            Signed Prescriptions Disp Refills   carvedilol (COREG) 12.5 MG tablet 180 tablet 0    Sig: TAKE 1 TABLET BY MOUTH TWICE DAILY WITH A MEAL     Cardiovascular: Beta Blockers 3 Failed - 05/23/2022 10:07 AM      Failed - Cr in normal range and within 360 days    Creatinine, Ser  Date Value Ref Range Status  03/13/2022 1.10 (H) 0.44 - 1.00 mg/dL Final         Failed - Last BP in normal range    BP  Readings from Last 1 Encounters:  05/21/22 (!) 149/61         Passed - AST in normal range and within 360 days    AST  Date Value Ref Range Status  01/22/2022 19 0 - 40 IU/L Final   AST (SGOT) Piccolo, Waived  Date Value Ref Range Status  07/16/2017 28 11 - 38 U/L Final         Passed - ALT in normal range and within 360 days    ALT  Date Value Ref Range Status  01/22/2022 10 0 - 32 IU/L Final   ALT (SGPT) Piccolo, Waived  Date Value Ref Range Status  07/16/2017 33 10 - 47 U/L Final         Passed - Last Heart Rate in normal range    Pulse Readings from Last 1 Encounters:  05/21/22 63         Passed - Valid encounter within last 6 months    Recent Outpatient Visits           2 months ago Hypertension associated with diabetes (HCC)   [name], [name], MD   4 months ago Cholecystitis   [name], [name], MD   4 months ago Cough, unspecified type   [facility] Vigg, [name], MD   6 months ago Diabetes mellitus without complication (HCC)   [name], [name], MD   8 months ago Need for influenza vaccination   [name], [name], MD       Future Appointments             In 1 month Gabriel Cirri, NP [facility], [name]

## 2022-05-23 NOTE — Telephone Encounter (Signed)
Requested Prescriptions  Pending Prescriptions Disp Refills  . ELIQUIS 5 MG TABS tablet [Pharmacy Med Name: Eliquis 5 MG Oral Tablet] 180 tablet 0    Sig: Take 1 tablet by mouth twice daily     Hematology:  Anticoagulants - apixaban Failed - 05/23/2022 10:07 AM      Failed - Cr in normal range and within 360 days    Creatinine, Ser  Date Value Ref Range Status  03/13/2022 1.10 (H) 0.44 - 1.00 mg/dL Final         Passed - PLT in normal range and within 360 days    Platelets  Date Value Ref Range Status  03/21/2022 206 150 - 450 x10E3/uL Final         Passed - HGB in normal range and within 360 days    Hemoglobin  Date Value Ref Range Status  03/21/2022 11.7 11.1 - 15.9 g/dL Final         Passed - HCT in normal range and within 360 days    Hematocrit  Date Value Ref Range Status  03/21/2022 35.3 34.0 - 46.6 % Final         Passed - AST in normal range and within 360 days    AST  Date Value Ref Range Status  01/22/2022 19 0 - 40 IU/L Final   AST (SGOT) Piccolo, Waived  Date Value Ref Range Status  07/16/2017 28 11 - 38 U/L Final         Passed - ALT in normal range and within 360 days    ALT  Date Value Ref Range Status  01/22/2022 10 0 - 32 IU/L Final   ALT (SGPT) Piccolo, Waived  Date Value Ref Range Status  07/16/2017 33 10 - 47 U/L Final         Passed - Valid encounter within last 12 months    Recent Outpatient Visits          2 months ago Hypertension associated with diabetes (HCC)   [name], [name], MD   4 months ago Cholecystitis   [name], [name], MD   4 months ago Cough, unspecified type   [name], [name], MD   6 months ago Diabetes mellitus without complication (HCC)   [name], [name], MD   8 months ago Need for influenza vaccination   [name], [name], MD      Future Appointments            In 1 month Gabriel Cirri, NP [facility], [name]           . carvedilol (COREG) 12.5 MG tablet [Pharmacy Med Name: Carvedilol 12.5 MG Oral Tablet] 180 tablet 0    Sig: TAKE 1 TABLET BY MOUTH TWICE DAILY WITH A MEAL     Cardiovascular: Beta Blockers 3 Failed - 05/23/2022 10:07 AM      Failed - Cr in normal range and within 360 days    Creatinine, Ser  Date Value Ref Range Status  03/13/2022 1.10 (H) 0.44 - 1.00 mg/dL Final         Failed - Last BP in normal range    BP Readings from Last 1 Encounters:  05/21/22 (!) 149/61         Passed - AST in normal range and within 360 days    AST  Date Value Ref Range Status  01/22/2022 19 0 - 40 IU/L Final  AST (SGOT) Piccolo, Waived  Date Value Ref Range Status  07/16/2017 28 11 - 38 U/L Final         Passed - ALT in normal range and within 360 days    ALT  Date Value Ref Range Status  01/22/2022 10 0 - 32 IU/L Final   ALT (SGPT) Piccolo, Waived  Date Value Ref Range Status  07/16/2017 33 10 - 47 U/L Final         Passed - Last Heart Rate in normal range    Pulse Readings from Last 1 Encounters:  05/21/22 63         Passed - Valid encounter within last 6 months    Recent Outpatient Visits          2 months ago Hypertension associated with diabetes (HCC)   [name], [name], MD   4 months ago Cholecystitis   [name], [name], MD   4 months ago Cough, unspecified type   [facility] Vigg, [name], MD   6 months ago Diabetes mellitus without complication (HCC)   [name], [name], MD   8 months ago Need for influenza vaccination   [name], [name], MD      Future Appointments            In 1 month Gabriel Cirri, NP [facility], [name]

## 2022-05-27 ENCOUNTER — Other Ambulatory Visit: Payer: Self-pay

## 2022-05-27 ENCOUNTER — Telehealth: Payer: Self-pay

## 2022-05-27 NOTE — Telephone Encounter (Signed)
Copied from CRM [id number]. Topic: Quick Communication - See Telephone Encounter >> May 27, 2022  8:56 AM Evon Slack wrote: CRM for notification. See Telephone encounter for: 05/27/22. >> May 27, 2022  9:02 AM Evon Slack wrote: Patient called stating that [facility] still has not received a new rx for her medication (Trazadone 50mg ). She stated that she has 4 medications waiting on her and she wants to pick them all up at the same time. Her preferred pharmacy is [address] and her cell phone # has been confirmed.

## 2022-05-27 NOTE — Telephone Encounter (Signed)
LOV  03/21/22  Future office visit 06/27/22

## 2022-05-27 NOTE — Telephone Encounter (Signed)
Patient made aware that her refill request for Trazodone was refused and verbalized understanding.

## 2022-06-06 ENCOUNTER — Other Ambulatory Visit: Payer: Self-pay | Admitting: Family Medicine

## 2022-06-07 NOTE — Telephone Encounter (Signed)
Requested medication (s) are due for refill today: yes  Requested medication (s) are on the active medication list: yes  Last refill:  05/13/22  Future visit scheduled: yes  Notes to clinic:  rx was prescribed by [name] who is no longer at practice.please advise     Requested Prescriptions  Pending Prescriptions Disp Refills   ELIQUIS 5 MG TABS tablet [Pharmacy Med Name: Eliquis 5 MG Oral Tablet] 180 tablet 0    Sig: Take 1 tablet by mouth twice daily     Hematology:  Anticoagulants - apixaban Failed - 06/06/2022  6:16 PM      Failed - Cr in normal range and within 360 days    Creatinine, Ser  Date Value Ref Range Status  03/13/2022 1.10 (H) 0.44 - 1.00 mg/dL Final         Passed - PLT in normal range and within 360 days    Platelets  Date Value Ref Range Status  03/21/2022 206 150 - 450 x10E3/uL Final         Passed - HGB in normal range and within 360 days    Hemoglobin  Date Value Ref Range Status  03/21/2022 11.7 11.1 - 15.9 g/dL Final         Passed - HCT in normal range and within 360 days    Hematocrit  Date Value Ref Range Status  03/21/2022 35.3 34.0 - 46.6 % Final         Passed - AST in normal range and within 360 days    AST  Date Value Ref Range Status  01/22/2022 19 0 - 40 IU/L Final   AST (SGOT) Piccolo, Waived  Date Value Ref Range Status  07/16/2017 28 11 - 38 U/L Final         Passed - ALT in normal range and within 360 days    ALT  Date Value Ref Range Status  01/22/2022 10 0 - 32 IU/L Final   ALT (SGPT) Piccolo, Waived  Date Value Ref Range Status  07/16/2017 33 10 - 47 U/L Final         Passed - Valid encounter within last 12 months    Recent Outpatient Visits           2 months ago Hypertension associated with diabetes (HCC)   [name], [name], MD   4 months ago Cholecystitis   [name], [name], MD   5 months ago Cough, unspecified type   [facility] Vigg, [name], MD    6 months ago Diabetes mellitus without complication (HCC)   [name], [name], MD   9 months ago Need for influenza vaccination   [name], [name], MD       Future Appointments             In 2 weeks Gabriel Cirri, NP [facility], [name]

## 2022-06-07 NOTE — Telephone Encounter (Signed)
Pt scheduled 9/7

## 2022-06-17 ENCOUNTER — Ambulatory Visit: Payer: Medicare HMO

## 2022-06-17 DIAGNOSIS — I480 Paroxysmal atrial fibrillation: Secondary | ICD-10-CM

## 2022-06-17 DIAGNOSIS — E1169 Type 2 diabetes mellitus with other specified complication: Secondary | ICD-10-CM

## 2022-06-17 DIAGNOSIS — E1122 Type 2 diabetes mellitus with diabetic chronic kidney disease: Secondary | ICD-10-CM

## 2022-06-17 NOTE — Progress Notes (Signed)
Chronic Care Management Pharmacy Note  06/17/2022 Name:  Amanda [name] MRN:  [id number] DOB:  02-01-1956  Summary: Today Very pleasant 66 year old female presents for f/u CCM visit. 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 Has a big family (Over 16 members) and is one of 8 siblings. Doesn't have children of her own  Recommendations Re-start statin Patient states she's taking Furosemide 1/2 tablet per day, not 2QD. Could we verify? Patient states she's no longer taking Trazodone. Didn't change sleep once she stopped taking it Onboard to [facility]  Subjective: Amanda [name] is an 66 y.o. year old female who is a primary patient of Practice, [facility].  The CCM team was consulted for assistance with disease management and care coordination needs.    Engaged with patient by telephone for follow up visit in response to provider referral for pharmacy case management and/or care coordination services.   Consent to Services:  The patient was given information about Chronic Care Management services, agreed to services, and gave verbal consent prior to initiation of services.  Please see initial visit note for detailed documentation.   Patient Care Team: [facility] as PCP - General Lane Hacker, [facility] (Pharmacist)  Hospital visits: None in previous 6 months  Objective:  Lab Results  Component Value Date   CREATININE 1.10 (H) 03/13/2022   CREATININE 1.32 (H) 02/27/2022   CREATININE 1.27 (H) 02/18/2022    Lab Results  Component Value Date   HGBA1C 6.9 (H) 01/22/2022   HGBA1C 7.0 (H) 11/22/2021   HGBA1C 6.9 (H) 11/05/2021   Last diabetic Eye exam: No results found for: "HMDIABEYEEXA"  Last diabetic Foot exam: No results found for: "HMDIABFOOTEX"      Component Value Date/Time   CHOL 142 08/30/2021 0822   CHOL 148 04/11/2021 0807   CHOL 154 12/25/2020 1512   CHOL 196 07/16/2017 1422   CHOL 148 09/16/2016 1558   CHOL 178 12/07/2015 1356   TRIG 163 (H)  08/30/2021 0822   TRIG 139 04/11/2021 0807   TRIG 176 (H) 12/25/2020 1512   TRIG 301 (H) 07/16/2017 1422   TRIG 199 (H) 09/16/2016 1558   TRIG 232 (H) 12/07/2015 1356   HDL 50 08/30/2021 0822   HDL 54 04/11/2021 0807   HDL 53 12/25/2020 1512   CHOLHDL 2.8 08/30/2021 0822   VLDL 60 (H) 07/16/2017 1422   LDLCALC 64 08/30/2021 0822   LDLCALC 70 04/11/2021 0807   LDLCALC 71 12/25/2020 1512       Latest Ref Rng & Units 01/22/2022    3:22 PM 01/04/2022    6:31 PM 11/22/2021    3:51 PM  Hepatic Function  Total Protein 6.0 - 8.5 g/dL 7.1  7.8  5.9   Albumin 3.8 - 4.8 g/dL 3.9  3.3  3.0   AST 0 - 40 IU/L 19  22  19    ALT 0 - 32 IU/L 10  12  16    Alk Phosphatase 44 - 121 IU/L 111  83  110   Total Bilirubin 0.0 - 1.2 mg/dL 0.3  0.7  0.4     Lab Results  Component Value Date/Time   TSH 3.901 01/04/2022 06:31 PM   TSH 1.640 08/30/2021 08:22 AM   TSH 2.130 09/05/2020 04:15 PM       Latest Ref Rng & Units 03/21/2022    9:23 AM 01/22/2022    3:22 PM 01/08/2022    4:29 AM  CBC  WBC 3.4 -  10.8 x10E3/uL 5.6  7.1  6.6   Hemoglobin 11.1 - 15.9 g/dL 11.7  9.3  8.8   Hematocrit 34.0 - 46.6 % 35.3  29.1  29.1   Platelets 150 - 450 x10E3/uL 206  372  209     No results found for: "VD25OH"  Clinical ASCVD:  The 10-year ASCVD risk score (Arnett DK, et al., 2019) is: 18.1%   Values used to calculate the score:     Age: 86 years     Sex: Female     Is Non-Hispanic African American: No     Diabetic: Yes     Tobacco smoker: No     Systolic Blood Pressure: 726 mmHg     Is BP treated: Yes     HDL Cholesterol: 50 mg/dL     Total Cholesterol: 142 mg/dL   Social History   Tobacco Use  Smoking Status Former   Types: Cigarettes   Quit date: 12/07/1975   Years since quitting: 46.5  Smokeless Tobacco Never   BP Readings from Last 3 Encounters:  05/21/22 (!) 149/61  03/21/22 122/70  02/18/22 (!) 146/72   Pulse Readings from Last 3 Encounters:  05/21/22 63  03/21/22 62  02/18/22 61   Wt  Readings from Last 3 Encounters:  05/21/22 164 lb (74.4 kg)  03/21/22 160 lb 12.8 oz (72.9 kg)  02/18/22 156 lb 6 oz (70.9 kg)   BMI Readings from Last 3 Encounters:  05/21/22 30.99 kg/m  03/21/22 30.40 kg/m  02/18/22 29.55 kg/m    Assessment: Review of patient past medical history, allergies, medications, health status, including review of consultants reports, laboratory and other test data, was performed as part of comprehensive evaluation and provision of chronic care management services.   SDOH:  (Social Determinants of Health) assessments and interventions performed: Yes SDOH Interventions    Flowsheet Row Most Recent Value  SDOH Interventions   Financial Strain Interventions Other (Comment)      Care Gaps - needs MCR, foot exam, colonoscopy   [facility] - no gaps for trulicity, there was a gap noted for Jardiance but had been advised to hold. Statin stopped in hosp. SDOH Interventions    Flowsheet Row Most Recent Value  SDOH Interventions   Financial Strain Interventions Other (Comment)       CCM Care Plan  Allergies  Allergen Reactions   Tramadol Other (See Comments)    "woozy"   Penicillins Other (See Comments)    Dizziness Tolerated augmentin but does not like taste    Medications Reviewed Today     Reviewed by Lane Hacker, [facility] (Pharmacist) on 06/17/22 at 1011  Med List Status: <None>   Medication Order Taking? Sig Documenting Provider Last Dose Status Informant  albuterol (VENTOLIN HFA) 108 (90 Base) MCG/ACT inhaler [id number]  Inhale 2 puffs into the lungs every 6 (six) hours as needed for wheezing or shortness of breath. Charlynne Cousins, MD  Active Self  amiodarone (PACERONE) 200 MG tablet [id number]  Take 200 mg by mouth daily. [provider]  Active Self  Calcium Carb-Cholecalciferol (CALCIUM 600+D3) 600-200 MG-UNIT TABS [id number]  Take 1 tablet by mouth in the morning. [provider]  Active Self  carvedilol (COREG) 12.5 MG  tablet [id number]  TAKE 1 TABLET BY MOUTH TWICE DAILY WITH A MEAL Johnson, Megan P, DO  Active   Dulaglutide (TRULICITY) 3 GQ/9.1QX SOPN [id number]  Inject 3 mg into the skin once a week. [provider]  Active  ELIQUIS 5 MG TABS tablet [id number]  Take 1 tablet by mouth twice daily Cannady, [name], NP  Active   empagliflozin (JARDIANCE) 10 MG TABS tablet [id number]  Take 1 tablet (10 mg total) by mouth daily before breakfast. Vigg, [name], MD  Active   ferrous sulfate 325 (65 FE) MG tablet [id number]  Take 1 tablet (325 mg total) by mouth daily. Bonnielee Haff, MD  Active Self  furosemide (LASIX) 40 MG tablet [id number]  Take 7m twice daily for 4 days, then take 470monce daily.  Patient taking differently: Take 20 mg by mouth daily.   KrBonnielee HaffMD  Active Self  Multiple Vitamin (MULTIVITAMIN) tablet [id number] 1 tablet by mouth daily. [provider]  Active Self  omeprazole (PRILOSEC) 20 MG capsule [id number] 20 mg by mouth daily. [provider]  Active Self  potassium chloride SA (KLOR-CON M) 20 MEQ tablet [id number] 1 tablet (20 mEq total) by mouth daily. HaAlisa GraffFNP  Active   sacubitril-valsartan (ENTRESTO) 24-26 [id number] 1 tablet by mouth 2 (two) times daily. HaDarylene Price, FNP  Active   traZODone (DESYREL) 50 MG tablet [id number] 1 tablet (50 mg total) by mouth at bedtime. WiLoletha GrayerMD  Active Self  vitamin B-12 (CYANOCOBALAMIN) 500 MCG tablet [id number] 500 mcg by mouth daily. [provider]  Active Self            Patient Active Problem List   Diagnosis Date Noted   Anemia 03/21/2022   Screen for colon cancer 03/21/2022   Acute on chronic congestive heart failure (HCWinooski04/01/2022   History of cholecystitis    Normocytic anemia 01/05/2022   Hypokalemia 01/05/2022   Cholecystostomy care (HCBorden03/17/2023   Acute dyspnea 01/04/2022   Bilateral pleural effusion 01/04/2022    Multiple pulmonary nodules determined by computed tomography of lung 01/04/2022   Hypertensive emergency    Acute on chronic diastolic CHF (congestive heart failure) (HCSilver City   Chronic anticoagulation    CAP (community acquired pneumonia)    Acute respiratory failure with hypoxia (HCC)    AF (paroxysmal atrial fibrillation) (HCPinopolis01/17/2023   Pancytopenia (HCGreensboro01/17/2023   Demand ischemia (HCSugar City01/17/2023   Cholecystitis    Diabetes mellitus without complication (HCNesika Beach1124/58/0998 B12 deficiency 12/26/2020   Obesity 03/01/2020   Insomnia 11/30/2019   GERD (gastroesophageal reflux disease) 11/30/2019   Uncontrolled type 2 diabetes mellitus with hyperglycemia, without long-term current use of insulin (HCRosedale11/28/2020   CKD (chronic kidney disease) stage 3, GFR 30-59 ml/min (HCC)    Hyperlipidemia associated with type 2 diabetes mellitus (HCConverse   Hypertension associated with diabetes (HCNew Hope    Immunization History  Administered Date(s) Administered   Fluad Quad(high Dose 65+) 09/03/2021   Influenza,inj,Quad PF,6+ Mos 09/16/2016, 07/16/2017, 07/22/2018, 08/18/2019, 08/01/2020   Moderna Sars-Covid-2 Vaccination 12/18/2019, 01/15/2020, 10/16/2020   Pneumococcal Conjugate-13 12/25/2020   Pneumococcal Polysaccharide-23 10/26/2002   Tdap 03/16/2013    Conditions to be addressed/monitored: CHF HLD CHF HTN Afib  Care Plan : CCVernon ValleyUpdates made by KeLane HackerRPMorrisonince 06/17/2022 12:00 AM     Problem: Diabetes, HTN, CKD, HLD, GERD, Obesity, CHF   Priority: High     Long-Range Goal: Disease Management   Start Date: 01/14/2022  Recent Progress: Not on track  Priority: High  Note:    Current Barriers:  Unable to maintain control of CHF  HLD HTN DM  Pharmacist Clinical Goal(s):  Patient will verbalize ability to afford treatment regimen through collaboration with PharmD and provider.   Interventions: 1:1 collaboration with Charlynne Cousins, MD regarding  development and update of comprehensive plan of care as evidenced by provider attestation and co-signature Inter-disciplinary care team collaboration (see longitudinal plan of care) Comprehensive medication review performed; medication list updated in electronic medical record  Diabetes (A1c goal <7%) Lab Results  Component Value Date   HGBA1C 6.9 (H) 01/22/2022   HGBA1C 7.0 (H) 11/22/2021   HGBA1C 6.9 (H) 11/05/2021   Lab Results  Component Value Date   MICROALBUR 30 (H) 12/25/2020   LDLCALC 64 08/30/2021   CREATININE 1.10 (H) 03/13/2022   Lab Results  Component Value Date   NA 140 03/13/2022   K 3.7 03/13/2022   CREATININE 1.10 (H) 03/13/2022   EGFR 55 (L) 01/22/2022   GFRNONAA 55 (L) 03/13/2022   GLUCOSE 149 (H) 03/13/2022   Lab Results  Component Value Date   WBC 5.6 03/21/2022   HGB 11.7 03/21/2022   HCT 35.3 03/21/2022   MCV 79 03/21/2022   PLT 206 03/21/2022   Lab Results  Component Value Date   LABMICR See below: 01/22/2022   LABMICR See below: 07/22/2018   MICROALBUR 30 (H) 12/25/2020   MICROALBUR 30 (H) 11/30/2019  -Well Controlled -s/p cholecystectomy 12/2021 -Current medications: Trulicity 38m weekly Appropriate, Effective, Safe, Query-Accessible Jardiance 10 mg once daily (PAP 2023) Appropriate, Effective, Safe, Query-Accessible -Medications previously tried: glimepiride, januvia, onglyza. Most recently stopped synjardy - reports being stopped at hospital stay  -Current home glucose readings fasting glucose: did not test today, normally. 191, 218, 176, 154. June 2023: 129-149 August 2023: Didn't have readings post prandial glucose: not checking -Denies hypoglycemic/hyperglycemic symptoms -Current meal patterns: watching carbs, not adding salt. Just had gallbladder removed -Current exercise: no formal exercise  June 2023. 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 -Educated on A1c and blood sugar goals; Benefits of routine  self-monitoring of blood sugar; -Counseled to check feet daily and get yearly eye exams -Counseled on diet and exercise extensively August 2023: Jardiance PAP submitted but patient hasn't heard back. Gave her number to call company and told her to call me either way to let me know. Will do Trulicity PAP as well. Coordinated with front desk, no samples available  Hyperlipidemia: (LDL goal < 70) The 10-year ASCVD risk score (Arnett DK, et al., 2019) is: 18.1%   Values used to calculate the score:     Age: [age]years     Sex: Female     Is Non-Hispanic African American: No     Diabetic: Yes     Tobacco smoker: No     Systolic Blood Pressure: 1024mmHg     Is BP treated: Yes     HDL Cholesterol: 50 mg/dL     Total Cholesterol: 142 mg/dL Lab Results  Component Value Date   CHOL 142 08/30/2021   CHOL 148 04/11/2021   CHOL 154 12/25/2020   Lab Results  Component Value Date   HDL 50 08/30/2021   HDL 54 04/11/2021   HDL 53 12/25/2020   Lab Results  Component Value Date   LDLCALC 64 08/30/2021   LDLCALC 70 04/11/2021   LDLCALC 71 12/25/2020   Lab Results  Component Value Date   TRIG 163 (H) 08/30/2021   TRIG 139 04/11/2021   TRIG 176 (H) 12/25/2020   Lab Results  Component Value Date   CHOLHDL 2.8 08/30/2021   CHOLHDL 2.7 04/11/2021   CHOLHDL 3.1 08/18/2019  No results found for: "LDLDIRECT" -Controlled -HTN, DM. ASCVD 10 yr risk - intermediate. Sister had MI per family Hx.  -Mild TG elevations.  -LDL controlled in 60s 08/2021 -Current treatment: N/A -Medications previously tried:had been on 80 mg daily of lovastatin.  -Current dietary patterns: see dm -Current exercise habits: see dm -Educated on Cholesterol goals;  Benefits of statin for ASCVD risk reduction; August 2023: Recommend Statin  Afib and Heart Failure (Goal: manage symptoms and prevent exacerbations) -Not ideally controlled -Afib recently noted in setting of septic shock - AC with eliquis 5 mg BID +  cardizem cd 180 mg daily -Has had lower K+, most recent 3.8 in hosp.  [place] [facility] 01/16/22 as new pt. -Last ejection fraction: 50-55% (Date: 10/2021). Last BNP 538 01/04/22 in hosp.  -HF type: Diastolic -NYHA Class: III -AHA HF Stage: B (Heart disease present - no symptoms present) -Current treatment: Amiodarone 200 mg daily Appropriate, Effective, Safe, Accessible Entresto 24-26 (Cardio completing PAP 2023) Appropriate, Effective, Safe, Query-Accessible Coreg 12.5 mg BID Appropriate, Effective, Safe, Accessible Furosemide 20 mg daily  Take potassium 20 meq with lasix Appropriate, Effective, Safe, Accessible Eliquis 5 mg twice daily (PAP denied 2023) Appropriate, Effective, Safe, Query accessible -Medications previously tried: previously on ramipril, also stopped 10/2021 w/ AKI. Was previously on cardizem.  -Current home BP/HR readings: SBP <130 -Current dietary habits: low carb low salt -Current exercise habits: n/a -Educated on Importance of weighing daily; if you gain more than 2 pounds in one day or 5 pounds in one week, contact provider or utilize action plan per cardiology -Counseled on diet and exercise extensively August 2023: Eliquis PAP denied but patient doesn't remember why. Form stated she forgot something (Was denied last week per patient). Recommended she find form and gave her number to call me ASAP so we can re-submit with missing data. Cardio working on PAP [employer] for [employer]  Patient Goals/Self-Care Activities Patient will:  - take medications as prescribed as evidenced by patient report and record review  Medication Assistance: Will send PAP applications for Jardiance and Eliquis - should qualify based off our review.      Patient's preferred pharmacy is:  [facility] [address] [place] - [place] [address] Phone: [phone] Fax: [phone]  [place] [address] Phone: [phone] Fax: [place], [place] [address] Phone: [phone] Fax: [phone]  Verbal consent obtained for [facility] enhanced pharmacy services (medication synchronization, adherence packaging, delivery coordination). A medication sync plan was created to allow patient to get all medications delivered once every 30 to 90 days per patient preference. Patient understands they have freedom to choose pharmacy and clinical pharmacist will coordinate care between all prescribers and [facility]. -Patient wants help organizing PAP's -Paid 2nd Wed of month and sometimes goes without meds Follow Up:  Patient agrees to Care Plan and Follow-up. Plan: HC PRN. Pharmacist [id number]  Future Appointments  Date Time Provider [place] 06/25/2022  9:00 AM HAlisa Graff FNP [facility] None  06/27/2022  9:00 AM WKathrine Haddock NP [name]  12/23/2022  9:00 AM CFP CCM PHARMACY [name]   NArizona Constable Pharm.D. -- [phone]

## 2022-06-17 NOTE — Patient Instructions (Signed)
Visit Information   Goals Addressed   None    Patient Care Plan: CCM Pharmacy Care Plan     Problem Identified: Diabetes, HTN, CKD, HLD, GERD, Obesity, CHF   Priority: High     Long-Range Goal: Disease Management   Start Date: 01/14/2022  Recent Progress: Not on track  Priority: High  Note:    Current Barriers:  Unable to maintain control of CHF HLD HTN DM  Pharmacist Clinical Goal(s):  Patient will verbalize ability to afford treatment regimen through collaboration with PharmD and provider.   Interventions: 1:1 collaboration with Charlynne Cousins, MD regarding development and update of comprehensive plan of care as evidenced by provider attestation and co-signature Inter-disciplinary care team collaboration (see longitudinal plan of care) Comprehensive medication review performed; medication list updated in electronic medical record  Diabetes (A1c goal <7%) Lab Results  Component Value Date   HGBA1C 6.9 (H) 01/22/2022   HGBA1C 7.0 (H) 11/22/2021   HGBA1C 6.9 (H) 11/05/2021   Lab Results  Component Value Date   MICROALBUR 30 (H) 12/25/2020   LDLCALC 64 08/30/2021   CREATININE 1.10 (H) 03/13/2022   Lab Results  Component Value Date   NA 140 03/13/2022   K 3.7 03/13/2022   CREATININE 1.10 (H) 03/13/2022   EGFR 55 (L) 01/22/2022   GFRNONAA 55 (L) 03/13/2022   GLUCOSE 149 (H) 03/13/2022   Lab Results  Component Value Date   WBC 5.6 03/21/2022   HGB 11.7 03/21/2022   HCT 35.3 03/21/2022   MCV 79 03/21/2022   PLT 206 03/21/2022   Lab Results  Component Value Date   LABMICR See below: 01/22/2022   LABMICR See below: 07/22/2018   MICROALBUR 30 (H) 12/25/2020   MICROALBUR 30 (H) 11/30/2019  -Well Controlled -s/p cholecystectomy 12/2021 -Current medications: Trulicity 14m weekly Appropriate, Effective, Safe, Query-Accessible Jardiance 10 mg once daily (PAP 2023) Appropriate, Effective, Safe, Query-Accessible -Medications previously tried: glimepiride, januvia,  onglyza. Most recently stopped synjardy - reports being stopped at hospital stay  -Current home glucose readings fasting glucose: did not test today, normally. 191, 218, 176, 154. June 2023: 129-149 August 2023: Didn't have readings post prandial glucose: not checking -Denies hypoglycemic/hyperglycemic symptoms -Current meal patterns: watching carbs, not adding salt. Just had gallbladder removed -Current exercise: no formal exercise  June 2023. 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 -Educated on A1c and blood sugar goals; Benefits of routine self-monitoring of blood sugar; -Counseled to check feet daily and get yearly eye exams -Counseled on diet and exercise extensively August 2023: Jardiance PAP submitted but patient hasn't heard back. Gave her number to call company and told her to call me either way to let me know. Will do Trulicity PAP as well. Coordinated with front desk, no samples available  Hyperlipidemia: (LDL goal < 70) The 10-year ASCVD risk score (Arnett DK, et al., 2019) is: 18.1%   Values used to calculate the score:     Age: [age]years     Sex: Female     Is Non-Hispanic African American: No     Diabetic: Yes     Tobacco smoker: No     Systolic Blood Pressure: 1315mmHg     Is BP treated: Yes     HDL Cholesterol: 50 mg/dL     Total Cholesterol: 142 mg/dL Lab Results  Component Value Date   CHOL 142 08/30/2021   CHOL 148 04/11/2021   CHOL 154 12/25/2020   Lab Results  Component Value Date   HDL 50 08/30/2021   HDL 54 04/11/2021   HDL 53 12/25/2020   Lab Results  Component Value Date   LDLCALC 64 08/30/2021   LDLCALC 70 04/11/2021   LDLCALC 71 12/25/2020   Lab Results  Component Value Date   TRIG 163 (H) 08/30/2021   TRIG 139 04/11/2021   TRIG 176 (H) 12/25/2020   Lab Results  Component Value Date   CHOLHDL 2.8 08/30/2021   CHOLHDL 2.7 04/11/2021   CHOLHDL 3.1 08/18/2019  No results found for:  "LDLDIRECT" -Controlled -HTN, DM. ASCVD 10 yr risk - intermediate. Sister had MI per family Hx.  -Mild TG elevations.  -LDL controlled in 60s 08/2021 -Current treatment: N/A -Medications previously tried:had been on 80 mg daily of lovastatin.  -Current dietary patterns: see dm -Current exercise habits: see dm -Educated on Cholesterol goals;  Benefits of statin for ASCVD risk reduction; August 2023: Recommend Statin  Afib and Heart Failure (Goal: manage symptoms and prevent exacerbations) -Not ideally controlled -Afib recently noted in setting of septic shock - AC with eliquis 5 mg BID + cardizem cd 180 mg daily -Has had lower K+, most recent 3.8 in hosp.  [place] HF clinic 01/16/22 as new pt. -Last ejection fraction: 50-55% (Date: 10/2021). Last BNP 538 01/04/22 in hosp.  -HF type: Diastolic -NYHA Class: III -AHA HF Stage: B (Heart disease present - no symptoms present) -Current treatment: Amiodarone 200 mg daily Appropriate, Effective, Safe, Accessible Entresto 24-26 (Cardio completing PAP 2023) Appropriate, Effective, Safe, Query-Accessible Coreg 12.5 mg BID Appropriate, Effective, Safe, Accessible Furosemide 20 mg daily  Take potassium 20 meq with lasix Appropriate, Effective, Safe, Accessible Eliquis 5 mg twice daily (PAP denied 2023) Appropriate, Effective, Safe, Query accessible -Medications previously tried: previously on ramipril, also stopped 10/2021 w/ AKI. Was previously on cardizem.  -Current home BP/HR readings: SBP <130 -Current dietary habits: low carb low salt -Current exercise habits: n/a -Educated on Importance of weighing daily; if you gain more than 2 pounds in one day or 5 pounds in one week, contact provider or utilize action plan per cardiology -Counseled on diet and exercise extensively August 2023: Eliquis PAP denied but patient doesn't remember why. Form stated she forgot something (Was denied last week per patient). Recommended she find form and gave her  number to call me ASAP so we can re-submit with missing data. Cardio working on PAP [employer] for [employer]  Patient Goals/Self-Care Activities Patient will:  - take medications as prescribed as evidenced by patient report and record review  Medication Assistance: Will send PAP applications for Jardiance and Eliquis - should qualify based off our review.      Ms. Biswas was given information about Chronic Care Management services today including:  CCM service includes personalized support from designated clinical staff supervised by her physician, including individualized plan of care and coordination with other care providers 24/7 contact phone numbers for assistance for urgent and routine care needs. Standard insurance, coinsurance, copays and deductibles apply for chronic care management only during months in which we provide at least 20 minutes of these services. Most insurances cover these services at 100%, however patients may be responsible for any copay, coinsurance and/or deductible if applicable. This service may help you avoid the need for more expensive face-to-face services. Only one practitioner may furnish and bill the service in a calendar month. The patient may stop CCM services at any time (effective at the end of the month) by phone call to the  office staff.  Patient agreed to services and verbal consent obtained.   The patient verbalized understanding of instructions, educational materials, and care plan provided today and DECLINED offer to receive copy of patient instructions, educational materials, and care plan.  The pharmacy team will reach out to the patient again over the next 60 days.   Lane Hacker, [place]

## 2022-06-19 ENCOUNTER — Other Ambulatory Visit: Payer: Self-pay | Admitting: Nurse Practitioner

## 2022-06-19 MED ORDER — CARVEDILOL 12.5 MG PO TABS
12.5000 mg | ORAL_TABLET | Freq: Two times a day (BID) | ORAL | 4 refills | Status: DC
Start: 2022-06-19 — End: 2023-06-24

## 2022-06-19 MED ORDER — EMPAGLIFLOZIN 10 MG PO TABS
10.0000 mg | ORAL_TABLET | Freq: Every day | ORAL | 4 refills | Status: DC
Start: 2022-06-19 — End: 2023-06-24

## 2022-06-19 MED ORDER — APIXABAN 5 MG PO TABS
5.0000 mg | ORAL_TABLET | Freq: Two times a day (BID) | ORAL | 4 refills | Status: DC
Start: 2022-06-19 — End: 2022-10-02

## 2022-06-19 MED ORDER — SEMAGLUTIDE (1 MG/DOSE) 4 MG/3ML ~~LOC~~ SOPN: 1.0000 mg | PEN_INJECTOR | SUBCUTANEOUS | 4 refills | Status: DC

## 2022-06-19 NOTE — Progress Notes (Signed)
Finish onboarding

## 2022-06-23 NOTE — Progress Notes (Unsigned)
Patient ID: Amanda [name], female    DOB: [birth year], 66 y.o.   MRN: [id number]  HPI  Amanda [name] is a 66 y/o female with a history of DM, hyperlipidemia, HTN, anxiety, atrial fibrillation, GERD, previous tobacco use and chronic heart failure.   Echo report from 01/05/22 reviewed and showed an EF of 50-55% along with moderate LAE & moderate MR.   Admitted 01/04/22 due to shortness of breath. CT scan showed pleural effusions. Cardiology and surgical consults obtained. Initially given IV lasix with subsequent diuresis of ~ 10L with transition to oral diuretics. Initially needed 4L oxygen but able to be weaned off to room air. Chest CTA showed multiple pulmonary nodules. OT consult obtained. Discharged after 4 days with plan to return the next day for cholecystectomy.   She presents today with a chief complaint of difficulty sleeping. Says that this has been going on for many years. Still sleeps on 2 pillows. Has associated intermittent left shoulder pain and slight weight gain along with this. She denies any abdominal distention, palpitations, pedal edema, chest pain, shortness of breath, cough, dizziness or fatigue.   Tolerating entresto without known side effects. Started back to work 2 weeks ago and feels fine. She feels like her weight gain is because her appetite has increased and gotten back to normal.    Past Medical History:  Diagnosis Date   Anxiety    Arthritis    Atrial fibrillation (HCC)    CHF (congestive heart failure) (HCC)    Cholecystitis    Diabetes mellitus without complication (HCC)    Dyspnea    GERD (gastroesophageal reflux disease)    Hyperlipidemia    Hypertension    Past Surgical History:  Procedure Laterality Date   CARDIOVERSION N/A 02/13/2022   Procedure: CARDIOVERSION;  Surgeon: Lamar Blinks, MD;  Location: [facility] ORS;  Service: Cardiovascular;  Laterality: N/A;   COLONOSCOPY  2012   IR CHOLANGIOGRAM EXISTING TUBE  12/11/2021   IR PERC CHOLECYSTOSTOMY  11/02/2021    Family History  Problem Relation Age of Onset   COPD Mother    Hypertension Mother    Diabetes Mother    Severe combined immunodeficiency Father    Diabetes Sister    Heart disease Sister    Heart disease Sister    Cancer Niece    Social History   Tobacco Use   Smoking status: Former    Types: Cigarettes    Quit date: 12/07/1975    Years since quitting: 46.5   Smokeless tobacco: Never  Substance Use Topics   Alcohol use: No   Allergies  Allergen Reactions   Tramadol Other (See Comments)    "woozy"   Penicillins Other (See Comments)    Dizziness Tolerated augmentin but does not like taste   Prior to Admission medications   Medication Sig Start Date End Date Taking? Authorizing Provider  albuterol (VENTOLIN HFA) 108 (90 Base) MCG/ACT inhaler Inhale 2 puffs into the lungs every 6 (six) hours as needed for wheezing or shortness of breath. 12/27/21  Yes Vigg, [name], MD  amiodarone (PACERONE) 200 MG tablet Take 200 mg by mouth daily. 01/22/22 01/22/23 Yes [provider]  apixaban (ELIQUIS) 5 MG TABS tablet Take 1 tablet (5 mg total) by mouth 2 (two) times daily. 06/19/22  Yes Cannady, [name], NP  Biotin 5000 MCG CAPS Take 5,000 mcg by mouth daily.   Yes [provider]  Calcium Carb-Cholecalciferol (CALCIUM 600+D3) 600-200 MG-UNIT TABS Take 1 tablet by mouth  in the morning.   Yes [provider]  carvedilol (COREG) 12.5 MG tablet Take 1 tablet (12.5 mg total) by mouth 2 (two) times daily with a meal. 06/19/22  Yes Cannady, [name], NP  empagliflozin (JARDIANCE) 10 MG TABS tablet Take 1 tablet (10 mg total) by mouth daily before breakfast. 06/19/22  Yes Cannady, [name], NP  ferrous sulfate 325 (65 FE) MG tablet Take 1 tablet (325 mg total) by mouth daily. 01/08/22  Yes Osvaldo Shipper, MD  furosemide (LASIX) 40 MG tablet Take 40mg  twice daily for 4 days, then take 40mg  once daily. Patient taking differently: Take 40 mg by mouth daily. Take 40mg  twice daily  for 4 days, then take 40mg  once daily. 01/08/22  Yes , MD  Multiple Vitamin (MULTIVITAMIN) tablet Take 1 tablet by mouth daily.   Yes [provider]  omeprazole (PRILOSEC) 20 MG capsule Take 20 mg by mouth daily.   Yes [provider]  potassium chloride SA (KLOR-CON M) 20 MEQ tablet Take 1 tablet (20 mEq total) by mouth daily. 05/21/22  Yes Itai Barbian A, FNP  sacubitril-valsartan (ENTRESTO) 24-26 MG Take 1 tablet by mouth 2 (two) times daily. 05/22/22  Yes Luvia Orzechowski A, FNP  Semaglutide, 1 MG/DOSE, 4 MG/3ML SOPN Inject 1 mg as directed once a week. 06/19/22  Yes Cannady, [name], NP  vitamin B-12 (CYANOCOBALAMIN) 500 MCG tablet Take 500 mcg by mouth daily.   Yes [provider]    Review of Systems  Constitutional:  Negative for appetite change and fatigue.  HENT:  Negative for congestion, postnasal drip and sore throat.   Eyes: Negative.   Respiratory:  Negative for cough, chest tightness and shortness of breath.   Cardiovascular:  Negative for chest pain, palpitations and leg swelling.  Gastrointestinal:  Negative for abdominal distention and abdominal pain.  Endocrine: Negative.   Genitourinary: Negative.   Musculoskeletal:  Positive for arthralgias (left shoulder). Negative for back pain and neck pain.  Skin: Negative.   Allergic/Immunologic: Negative.   Neurological:  Negative for dizziness and light-headedness.  Hematological:  Negative for adenopathy. Does not bruise/bleed easily.  Psychiatric/Behavioral:  Positive for sleep disturbance (sleeping on 2 pillows). Negative for dysphoric mood. The patient is not nervous/anxious.    Vitals:   06/25/22 0856  BP: (!) 158/57  Pulse: 63  Resp: 18  SpO2: 100%  Weight: 167 lb 4 oz (75.9 kg)  Height: 5\' 1"  (1.549 m)   Wt Readings from Last 3 Encounters:  06/25/22 167 lb 4 oz (75.9 kg)  05/21/22 164 lb (74.4 kg)  03/21/22 160 lb 12.8 oz (72.9 kg)   Lab Results  Component Value Date    CREATININE 1.10 (H) 03/13/2022   CREATININE 1.32 (H) 02/27/2022   CREATININE 1.27 (H) 02/18/2022   Physical Exam Vitals and nursing note reviewed.  Constitutional:      Appearance: Normal appearance.  HENT:     Head: Normocephalic and atraumatic.  Cardiovascular:     Rate and Rhythm: Normal rate and regular rhythm.  Pulmonary:     Effort: Pulmonary effort is normal. No respiratory distress.     Breath sounds: No wheezing or rales.  Abdominal:     General: There is no distension.     Palpations: Abdomen is soft.     Tenderness: There is no abdominal tenderness.  Musculoskeletal:        General: No tenderness.     Cervical back: Normal range of motion and neck supple.  Right lower leg: No edema.     Left lower leg: No edema.  Skin:    General: Skin is warm and dry.  Neurological:     General: No focal deficit present.     Mental Status: She is alert and oriented to person, place, and time.  Psychiatric:        Mood and Affect: Mood normal.        Behavior: Behavior normal.        Thought Content: Thought content normal.    Assessment & Plan:  1: Chronic heart failure with preserved ejection fraction with structural changes (LAE)- - NYHA class I - euvolemic - weighing daily; reminded to call for an overnight weight gain of > 2 pounds or a weekly weight gain of > 5 pounds - weight up 3 pounds from last visit here 1 month ago - does add salt to her eggs but says that she's trying to not add any salt to anything else; trying to keep daily sodium intake to ~ 2000mg  / day - on GDMT of jardiance & entresto - BMP today since entresto started at last visit; discussed titrating this at her next visit - BNP 01/04/22 was 537.6  2: HTN- - BP mildly elevated (158/57) but she says at home this morning it was 121/51 - saw PCP (Vigg) 03/21/22 - BMP 03/13/22 reviewed and showed sodium 140, potassium 3.7, creatinine 1.10 and GFR 55  3: DM- - A1c 01/22/22 was 6.9% - fasting glucose at  home today was 138  4: Atrial fibrillation- - saw cardiology(Kowalski) 02/20/22 - currently on apixaban and amiodarone    Medication bottles reviewed.   Return in 1 month, sooner if needed.

## 2022-06-25 ENCOUNTER — Ambulatory Visit (HOSPITAL_BASED_OUTPATIENT_CLINIC_OR_DEPARTMENT_OTHER): Payer: Medicare HMO | Admitting: Family

## 2022-06-25 ENCOUNTER — Ambulatory Visit: Payer: Medicare HMO | Admitting: Unknown Physician Specialty

## 2022-06-25 ENCOUNTER — Other Ambulatory Visit
Admission: RE | Admit: 2022-06-25 | Discharge: 2022-06-25 | Disposition: A | Discharge status: Home / Self Care | Payer: Medicare HMO | Source: Ambulatory Visit | Attending: Family | Admitting: Family

## 2022-06-25 ENCOUNTER — Encounter: Payer: Self-pay | Admitting: Family

## 2022-06-25 VITALS — BP 158/57 | HR 63 | Resp 18 | Ht 61.0 in | Wt 167.2 lb

## 2022-06-25 DIAGNOSIS — K219 Gastro-esophageal reflux disease without esophagitis: Secondary | ICD-10-CM | POA: Insufficient documentation

## 2022-06-25 DIAGNOSIS — Z87891 Personal history of nicotine dependence: Secondary | ICD-10-CM | POA: Insufficient documentation

## 2022-06-25 DIAGNOSIS — E785 Hyperlipidemia, unspecified: Secondary | ICD-10-CM | POA: Insufficient documentation

## 2022-06-25 DIAGNOSIS — F419 Anxiety disorder, unspecified: Secondary | ICD-10-CM | POA: Insufficient documentation

## 2022-06-25 DIAGNOSIS — G479 Sleep disorder, unspecified: Secondary | ICD-10-CM | POA: Insufficient documentation

## 2022-06-25 DIAGNOSIS — I4891 Unspecified atrial fibrillation: Secondary | ICD-10-CM | POA: Insufficient documentation

## 2022-06-25 DIAGNOSIS — I110 Hypertensive heart disease with heart failure: Secondary | ICD-10-CM | POA: Insufficient documentation

## 2022-06-25 DIAGNOSIS — I10 Essential (primary) hypertension: Secondary | ICD-10-CM

## 2022-06-25 DIAGNOSIS — I5032 Chronic diastolic (congestive) heart failure: Secondary | ICD-10-CM | POA: Diagnosis not present

## 2022-06-25 DIAGNOSIS — Z79899 Other long term (current) drug therapy: Secondary | ICD-10-CM | POA: Insufficient documentation

## 2022-06-25 DIAGNOSIS — M25512 Pain in left shoulder: Secondary | ICD-10-CM | POA: Insufficient documentation

## 2022-06-25 DIAGNOSIS — Z7901 Long term (current) use of anticoagulants: Secondary | ICD-10-CM | POA: Insufficient documentation

## 2022-06-25 DIAGNOSIS — E119 Type 2 diabetes mellitus without complications: Secondary | ICD-10-CM | POA: Diagnosis not present

## 2022-06-25 DIAGNOSIS — I480 Paroxysmal atrial fibrillation: Secondary | ICD-10-CM

## 2022-06-25 LAB — BASIC METABOLIC PANEL
Anion gap: 9 (ref 5–15)
BUN: 23 mg/dL (ref 8–23)
CO2: 28 mmol/L (ref 22–32)
Calcium: 9.5 mg/dL (ref 8.9–10.3)
Chloride: 105 mmol/L (ref 98–111)
Creatinine, Ser: 1.28 mg/dL — ABNORMAL HIGH (ref 0.44–1.00)
GFR, Estimated: 46 mL/min — ABNORMAL LOW (ref >60)
Glucose, Bld: 128 mg/dL — ABNORMAL HIGH (ref 70–99)
Potassium: 4.3 mmol/L (ref 3.5–5.1)
Sodium: 142 mmol/L (ref 135–145)

## 2022-06-25 NOTE — Patient Instructions (Signed)
Continue weighing daily and call for an overnight weight gain of 3 pounds or more or a weekly weight gain of more than 5 pounds.   If you have voicemail, please make sure your mailbox is cleaned out so that we may leave a message and please make sure to listen to any voicemails.     

## 2022-06-26 ENCOUNTER — Telehealth: Payer: Self-pay

## 2022-06-26 NOTE — Progress Notes (Signed)
Patient states she has received the Patient assistance applications for her Jardiance, Eliquis and Ozempic by mail and will be bring them in tomorrow. I have also mentioned to the patient that there are samples at the [facility] office and at her HF and heart care office as well as told by Aura Dials until we get her Ozempic approved through the assistance program. Patient understood.  Rance Muir, RMA Health Concierge

## 2022-06-27 ENCOUNTER — Telehealth: Payer: Self-pay | Admitting: Nurse Practitioner

## 2022-06-27 ENCOUNTER — Telehealth: Payer: Self-pay

## 2022-06-27 ENCOUNTER — Ambulatory Visit: Payer: Medicare HMO | Admitting: Unknown Physician Specialty

## 2022-06-27 DIAGNOSIS — I10 Essential (primary) hypertension: Secondary | ICD-10-CM | POA: Diagnosis not present

## 2022-06-27 DIAGNOSIS — E1169 Type 2 diabetes mellitus with other specified complication: Secondary | ICD-10-CM | POA: Diagnosis not present

## 2022-06-27 DIAGNOSIS — I5032 Chronic diastolic (congestive) heart failure: Secondary | ICD-10-CM | POA: Diagnosis not present

## 2022-06-27 DIAGNOSIS — E785 Hyperlipidemia, unspecified: Secondary | ICD-10-CM | POA: Diagnosis not present

## 2022-06-27 DIAGNOSIS — E1159 Type 2 diabetes mellitus with other circulatory complications: Secondary | ICD-10-CM | POA: Diagnosis not present

## 2022-06-27 DIAGNOSIS — I480 Paroxysmal atrial fibrillation: Secondary | ICD-10-CM | POA: Diagnosis not present

## 2022-06-27 DIAGNOSIS — I152 Hypertension secondary to endocrine disorders: Secondary | ICD-10-CM | POA: Diagnosis not present

## 2022-06-27 NOTE — Telephone Encounter (Signed)
Hello Ms. Amanda [name], I am going through our reports for the office and see that you are overdue for your mammogram. I would be glad to order and or schedule the mammogram for you if you would like. We typically use [facility] for this. Would you be ok with this? If so please return our call with what days and times of the day that works best for you. Please let me know either way which you would like to do. I hope you have a great day?

## 2022-06-27 NOTE — Telephone Encounter (Signed)
error 

## 2022-06-27 NOTE — Telephone Encounter (Signed)
Noted  

## 2022-07-02 ENCOUNTER — Encounter: Payer: Self-pay | Admitting: Pharmacist

## 2022-07-02 NOTE — Progress Notes (Signed)
[facility])                                            [facility] Quality Pharmacy Team                                        Statin Quality Measure Assessment    07/02/2022  Amanda [name] 1956-02-18 [id number]   Per review of chart and payor information, patient has a diagnosis of diabetes but is not currently filling a statin prescription.  This places patient into the SUPD (Statin Use In Patients with Diabetes) measure for CMS.     The 10-year ASCVD risk score (Arnett DK, et al., 2019) is: 20.1%   Values used to calculate the score:     Age: 5 years     Sex: Female     Is Non-Hispanic African American: No     Diabetic: Yes     Tobacco smoker: No     Systolic Blood Pressure: 158 mmHg     Is BP treated: Yes     HDL Cholesterol: 50 mg/dL     Total Cholesterol: 142 mg/dL 32/95/1884     Component Value Date/Time   CHOL 142 08/30/2021 0822   CHOL 196 07/16/2017 1422   TRIG 163 (H) 08/30/2021 0822   TRIG 301 (H) 07/16/2017 1422   HDL 50 08/30/2021 0822   CHOLHDL 2.8 08/30/2021 0822   VLDL 60 (H) 07/16/2017 1422   LDLCALC 64 08/30/2021 0822    Please consider ONE of the following recommendations:  Initiate high intensity statin Atorvastatin 40mg  once daily, #90, 3 refills   Rosuvastatin 20mg  once daily, #90, 3 refills    Initiate moderate intensity          statin with reduced frequency if prior          statin intolerance 1x weekly, #13, 3 refills   2x weekly, #26, 3 refills   3x weekly, #39, 3 refills    Code for past statin intolerance or  other exclusions (required annually)  Provider Requirements: Associate code during an office visit or telehealth encounter  Drug Induced Myopathy G72.0   Myopathy, unspecified G72.9   Myositis, unspecified M60.9   Rhabdomyolysis M62.82   Alcoholic fatty liver K70.0   Cirrhosis of liver K74.69   Prediabetes R73.03   PCOS E28.2   Toxic liver disease, unspecified K71.9    Adverse effect of antihyperlipidemic and antiarteriosclerotic drugs, initial encounter   , PharmD [facility]  [facility] Clinical Pharmacist Office: [phone]

## 2022-07-03 ENCOUNTER — Encounter: Payer: Self-pay | Admitting: Nurse Practitioner

## 2022-07-03 ENCOUNTER — Ambulatory Visit (INDEPENDENT_AMBULATORY_CARE_PROVIDER_SITE_OTHER): Payer: Medicare HMO | Admitting: Nurse Practitioner

## 2022-07-03 VITALS — BP 122/74 | HR 61 | Temp 98.5°F | Wt 167.9 lb

## 2022-07-03 DIAGNOSIS — E538 Deficiency of other specified B group vitamins: Secondary | ICD-10-CM

## 2022-07-03 DIAGNOSIS — D649 Anemia, unspecified: Secondary | ICD-10-CM

## 2022-07-03 DIAGNOSIS — I5033 Acute on chronic diastolic (congestive) heart failure: Secondary | ICD-10-CM | POA: Diagnosis not present

## 2022-07-03 DIAGNOSIS — E1169 Type 2 diabetes mellitus with other specified complication: Secondary | ICD-10-CM | POA: Diagnosis not present

## 2022-07-03 DIAGNOSIS — I480 Paroxysmal atrial fibrillation: Secondary | ICD-10-CM

## 2022-07-03 DIAGNOSIS — E1159 Type 2 diabetes mellitus with other circulatory complications: Secondary | ICD-10-CM

## 2022-07-03 DIAGNOSIS — E1165 Type 2 diabetes mellitus with hyperglycemia: Secondary | ICD-10-CM | POA: Diagnosis not present

## 2022-07-03 DIAGNOSIS — I152 Hypertension secondary to endocrine disorders: Secondary | ICD-10-CM

## 2022-07-03 DIAGNOSIS — D61818 Other pancytopenia: Secondary | ICD-10-CM

## 2022-07-03 DIAGNOSIS — E785 Hyperlipidemia, unspecified: Secondary | ICD-10-CM

## 2022-07-03 DIAGNOSIS — N1831 Chronic kidney disease, stage 3a: Secondary | ICD-10-CM

## 2022-07-03 NOTE — Assessment & Plan Note (Signed)
BP WNL today. BP check/log reviewed, pt blood pressure well controlled. No changes made to current regimen.

## 2022-07-03 NOTE — Assessment & Plan Note (Signed)
Pt remains on Vit B12 supplement. B12 level assessed today. Will f/u if medication change is warranted.

## 2022-07-03 NOTE — Assessment & Plan Note (Addendum)
Pt in no acute distress, appears euvolemi. No changes made to current regimen. Pt tolerating the adding of entresto. Pt reminded of weight changes and when to contact the clinic d/t fluid retention and increased dyspnea with exertion/activity.

## 2022-07-03 NOTE — Assessment & Plan Note (Signed)
Lipid panel drawn in clinic today. Not currently on cholesterol medication, will continue to monitor.  Once labs result, will contact pt if need of medication exists. Low cholesterol diet stressed and maintaining active lifestyle.

## 2022-07-03 NOTE — Assessment & Plan Note (Addendum)
CBC drawn today. No c/o symptomatic anemia.  Pt remains compliant w/ iron supplements.

## 2022-07-03 NOTE — Assessment & Plan Note (Signed)
Pt in normal rate/rhythm in office. Pt compliant with eliquis and amiodarone for Afib.

## 2022-07-03 NOTE — Progress Notes (Signed)
BP 122/74   Pulse 61   Temp 98.5 F (36.9 C) (Oral)   Wt 76.2 kg   SpO2 98%   BMI 31.72 kg/m    Subjective:    Patient ID: Amanda [name], female    DOB: [birth year], 66 y.o.   MRN: [id number]  HPI: Amanda [name] is a 66 y.o. female  NOTE WRITTEN BY DNP STUDENT.  ASSESSMENT AND PLAN OF CARE REVIEWED WITH STUDENT, AGREE WITH ABOVE FINDINGS AND PLAN.   Chief Complaint  Patient presents with   Hypertension   Hyperlipidemia   Diabetes    3 month follow up    Shoulder Pain     L shoulder pain/ decreased ROM. Denies recent fall/injury within the last 3 months. Reports she did have a fall back in January. Patient also c/o hip discomfort and pain when turning to the sides when she is laying down in bed.    HYPERTENSION / HYPERLIPIDEMIA/HF Satisfied with current treatment? yes Duration of hypertension: chronic BP monitoring frequency: a few times a week BP range: 119-125 BP medication side effects: no Past BP meds: carvedilol Duration of hyperlipidemia: chronic Cholesterol medication side effects: no Cholesterol supplements: none Past cholesterol medications: none Medication compliance: Compliant Aspirin: no Recent stressors: no Recurrent headaches: no Visual changes: no Palpitations: no Dyspnea: no Chest pain: no Lower extremity edema: yes Dizzy/lightheaded: no  DIABETES Hypoglycemic episodes:no Polydipsia/polyuria: yes Visual disturbance: no Chest pain: no Paresthesias: no Glucose Monitoring: yes  Accucheck frequency:  1-2x a week  Fasting glucose:145  Post prandial:  Evening:  Before meals: Taking Insulin?: no  Long acting insulin:  Short acting insulin: Blood Pressure Monitoring: a few times a week Retinal Examination: Up to date; due NOV 2023 Foot Exam: Up to Date; due NOV 2023 Diabetic Education: Completed Pneumovax:  not yet Influenza: Not up to Date Aspirin: no  CHRONIC KIDNEY DISEASE CKD status: stable Medications renally dose: yes Previous  renal evaluation: yes; inpatient March 2023 Pneumovax:  Not up to Date Influenza Vaccine:  Not up to Date  ANEMIA Anemia status: stable Etiology of anemia: Duration of anemia treatment:  Compliance with treatment: excellent compliance Iron supplementation side effects: no Severity of anemia: mild Fatigue: yes Decreased exercise tolerance: no  Dyspnea on exertion: no Palpitations: no Bleeding: no Pica: no   Relevant past medical, surgical, family and social history reviewed and updated as indicated. Interim medical history since our last visit reviewed. Allergies and medications reviewed and updated.  Review of Systems  Constitutional:  Negative for activity change, appetite change, chills, diaphoresis, fatigue, fever and unexpected weight change.  HENT:  Negative for congestion.   Respiratory:  Negative for cough, chest tightness and shortness of breath.   Cardiovascular:  Negative for chest pain, palpitations and leg swelling.  Gastrointestinal:  Negative for abdominal distention, abdominal pain, constipation, diarrhea, nausea and vomiting.  Endocrine: Positive for polyuria (lasix, jardiance). Negative for polydipsia and polyphagia.  Genitourinary:  Negative for difficulty urinating, dysuria, flank pain and frequency.  Musculoskeletal:  Negative for arthralgias.  Neurological:  Negative for dizziness, syncope, weakness and headaches.    Per HPI unless specifically indicated above     Objective:    BP 122/74   Pulse 61   Temp 98.5 F (36.9 C) (Oral)   Wt 76.2 kg   SpO2 98%   BMI 31.72 kg/m   Wt Readings from Last 3 Encounters:  07/03/22 76.2 kg  06/25/22 75.9 kg  05/21/22 74.4 kg    Physical  Exam Constitutional:      Appearance: Normal appearance. She is obese.  HENT:     Head: Normocephalic.     Mouth/Throat:     Mouth: Mucous membranes are moist.  Eyes:     Pupils: Pupils are equal, round, and reactive to light.  Cardiovascular:     Rate and Rhythm:  Normal rate and regular rhythm.     Pulses: Normal pulses.     Heart sounds: Normal heart sounds. No murmur heard.    No gallop.  Pulmonary:     Effort: Pulmonary effort is normal. No respiratory distress.     Breath sounds: Normal breath sounds. No stridor. No wheezing or rhonchi.  Chest:     Chest wall: No tenderness.  Abdominal:     General: Bowel sounds are normal.     Palpations: Abdomen is soft.     Tenderness: There is no abdominal tenderness.  Musculoskeletal:     Cervical back: Normal range of motion.     Right lower leg: Edema (Non-pitting) present.     Left lower leg: Edema (Non-pitting) present.  Skin:    General: Skin is warm and dry.     Capillary Refill: Capillary refill takes 2 to 3 seconds.  Neurological:     General: No focal deficit present.     Mental Status: She is alert and oriented to person, place, and time. Mental status is at baseline.  Psychiatric:        Mood and Affect: Mood normal.        Behavior: Behavior normal.        Thought Content: Thought content normal.        Judgment: Judgment normal.    Results for orders placed or performed in visit on 11/94/17  Basic metabolic panel  Result Value Ref Range   Sodium 142 135 - 145 mmol/L   Potassium 4.3 3.5 - 5.1 mmol/L   Chloride 105 98 - 111 mmol/L   CO2 28 22 - 32 mmol/L   Glucose, Bld 128 (H) 70 - 99 mg/dL   BUN 23 8 - 23 mg/dL   Creatinine, Ser 1.28 (H) 0.44 - 1.00 mg/dL   Calcium 9.5 8.9 - 10.3 mg/dL   GFR, Estimated 46 (L) >60 mL/min   Anion gap 9 5 - 15      Assessment & Plan:   Problem List Items Addressed This Visit       Cardiovascular and Mediastinum   Acute on chronic diastolic CHF (congestive heart failure) (HCC) - Primary (Chronic)    Pt in no acute distress, appears euvolemi. No changes made to current regimen. Pt tolerating the adding of entresto. Pt reminded of weight changes and when to contact the clinic d/t fluid retention and increased dyspnea with exertion/activity.       Hypertension associated with diabetes ([place])    BP WNL today. BP check/log reviewed, pt blood pressure well controlled. No changes made to current regimen.       Relevant Orders   Comp Met (CMET)   AF (paroxysmal atrial fibrillation) (HCC)    Pt in normal rate/rhythm in office. Pt compliant with eliquis and amiodarone for Afib.          Endocrine   Hyperlipidemia associated with type 2 diabetes mellitus ([place])    Lipid panel drawn in clinic today. Not currently on cholesterol medication, will continue to monitor.  Once labs result, will contact pt if need of medication exists. Low cholesterol diet stressed and  maintaining active lifestyle.       Uncontrolled type 2 diabetes mellitus with hyperglycemia, without long-term current use of insulin (HCC)    Pt A1C drawn today.  Pt tolerating adding of ozempic. Denies any adverse side effects at this time. Pt warned of GI disturbances she may experience.  Will f/u with patient on results of labs if dose change is required for any medications.  No up-titration of ozempic today.       Relevant Orders   HgB A1c     Genitourinary   CKD (chronic kidney disease) stage 3, GFR 30-59 ml/min (HCC)      BMP drawn today to assess renal function and electrolytes. Pt remains compliant with her medications.         Hematopoietic and Hemostatic   Pancytopenia ([place])    CBC drawn today to assess levels.  No s/s of infx noted.         Other   B12 deficiency    Pt remains on Vit B12 supplement. B12 level assessed today. Will f/u if medication change is warranted.       Relevant Orders   B12   Anemia    CBC drawn today. No c/o symptomatic anemia.  Pt remains compliant w/ iron supplements.      Relevant Orders   CBC w/Diff     Follow up plan: Return in about 3 months (around 10/02/2022) for HTN, HLD, HF, DM.

## 2022-07-03 NOTE — Assessment & Plan Note (Signed)
Pt A1C drawn today.  Pt tolerating adding of ozempic. Denies any adverse side effects at this time. Pt warned of GI disturbances she may experience.  Will f/u with patient on results of labs if dose change is required for any medications.  No up-titration of ozempic today.

## 2022-07-03 NOTE — Assessment & Plan Note (Addendum)
CBC drawn today to assess levels.  No s/s of infx noted.

## 2022-07-03 NOTE — Assessment & Plan Note (Signed)
BMP drawn today to assess renal function and electrolytes. Pt remains compliant with her medications.

## 2022-07-04 ENCOUNTER — Encounter: Payer: Self-pay | Admitting: Nurse Practitioner

## 2022-07-04 LAB — COMPREHENSIVE METABOLIC PANEL
ALT: 16 IU/L (ref 0–32)
AST: 22 IU/L (ref 0–40)
Albumin/Globulin Ratio: 1.4 (ref 1.2–2.2)
Albumin: 4 g/dL (ref 3.9–4.9)
Alkaline Phosphatase: 88 IU/L (ref 44–121)
BUN/Creatinine Ratio: 14 (ref 12–28)
BUN: 20 mg/dL (ref 8–27)
Bilirubin Total: 0.3 mg/dL (ref 0.0–1.2)
CO2: 25 mmol/L (ref 20–29)
Calcium: 9.1 mg/dL (ref 8.7–10.3)
Chloride: 104 mmol/L (ref 96–106)
Creatinine, Ser: 1.45 mg/dL — ABNORMAL HIGH (ref 0.57–1.00)
Globulin, Total: 2.9 g/dL (ref 1.5–4.5)
Glucose: 131 mg/dL — ABNORMAL HIGH (ref 70–99)
Potassium: 4.1 mmol/L (ref 3.5–5.2)
Sodium: 142 mmol/L (ref 134–144)
Total Protein: 6.9 g/dL (ref 6.0–8.5)
eGFR: 40 mL/min/{1.73_m2} — ABNORMAL LOW (ref >59)

## 2022-07-04 LAB — CBC WITH DIFFERENTIAL/PLATELET
Basophils Absolute: 0 10*3/uL (ref 0.0–0.2)
Basos: 1 %
EOS (ABSOLUTE): 0.2 10*3/uL (ref 0.0–0.4)
Eos: 3 %
Hematocrit: 36 % (ref 34.0–46.6)
Hemoglobin: 11.8 g/dL (ref 11.1–15.9)
Immature Grans (Abs): 0 10*3/uL (ref 0.0–0.1)
Immature Granulocytes: 0 %
Lymphocytes Absolute: 1.8 10*3/uL (ref 0.7–3.1)
Lymphs: 32 %
MCH: 29.7 pg (ref 26.6–33.0)
MCHC: 32.8 g/dL (ref 31.5–35.7)
MCV: 91 fL (ref 79–97)
Monocytes Absolute: 0.4 10*3/uL (ref 0.1–0.9)
Monocytes: 7 %
Neutrophils Absolute: 3.3 10*3/uL (ref 1.4–7.0)
Neutrophils: 57 %
Platelets: 193 10*3/uL (ref 150–450)
RBC: 3.97 x10E6/uL (ref 3.77–5.28)
RDW: 13.8 % (ref 11.7–15.4)
WBC: 5.7 10*3/uL (ref 3.4–10.8)

## 2022-07-04 LAB — HEMOGLOBIN A1C
Est. average glucose Bld gHb Est-mCnc: 143 mg/dL
Hgb A1c MFr Bld: 6.6 % — ABNORMAL HIGH (ref 4.8–5.6)

## 2022-07-04 LAB — VITAMIN B12: Vitamin B-12: >2000 pg/mL — ABNORMAL HIGH (ref 232–1245)

## 2022-07-04 NOTE — Telephone Encounter (Signed)
Forms have been completed and signed by PCP.

## 2022-07-04 NOTE — Progress Notes (Signed)
Hi Tinisha. It was nice to see you yesterday.  Your lab work looks good.  A1c is well controlled at 6.6.  No concerns at this time. Continue with your current medication regimen.  Follow up as discussed.  Please let me know if you have any questions.

## 2022-07-05 NOTE — Telephone Encounter (Signed)
Forms picked up by pharmacy team.

## 2022-07-24 ENCOUNTER — Other Ambulatory Visit: Payer: Self-pay | Admitting: Nurse Practitioner

## 2022-07-24 MED ORDER — SEMAGLUTIDE (1 MG/DOSE) 4 MG/3ML ~~LOC~~ SOPN: 1.0000 mg | PEN_INJECTOR | SUBCUTANEOUS | 4 refills | Status: DC

## 2022-07-24 NOTE — Telephone Encounter (Signed)
Medication Refill - Medication: Semaglutide, 1 MG/DOSE, 4 MG/3ML SOPN  Has the patient contacted their pharmacy? No.  Preferred Pharmacy (with phone number or street name):  [facility] - [place], [place] - [address] Phone:  [phone]  Fax:  [phone]     Has the patient been seen for an appointment in the last year OR does the patient have an upcoming appointment? Yes.    Agent: Please be advised that RX refills may take up to 3 business days. We ask that you follow-up with your pharmacy.

## 2022-07-25 ENCOUNTER — Ambulatory Visit: Payer: Medicare HMO | Attending: Family | Admitting: Family

## 2022-07-25 ENCOUNTER — Encounter: Payer: Self-pay | Admitting: Family

## 2022-07-25 ENCOUNTER — Other Ambulatory Visit
Admission: RE | Admit: 2022-07-25 | Discharge: 2022-07-25 | Disposition: A | Discharge status: Home / Self Care | Payer: Medicare HMO | Source: Ambulatory Visit | Attending: Family | Admitting: Family

## 2022-07-25 VITALS — BP 156/54 | HR 64 | Resp 14 | Ht 61.0 in | Wt 167.4 lb

## 2022-07-25 DIAGNOSIS — K219 Gastro-esophageal reflux disease without esophagitis: Secondary | ICD-10-CM | POA: Insufficient documentation

## 2022-07-25 DIAGNOSIS — I5032 Chronic diastolic (congestive) heart failure: Secondary | ICD-10-CM

## 2022-07-25 DIAGNOSIS — Z87891 Personal history of nicotine dependence: Secondary | ICD-10-CM | POA: Insufficient documentation

## 2022-07-25 DIAGNOSIS — I10 Essential (primary) hypertension: Secondary | ICD-10-CM | POA: Diagnosis not present

## 2022-07-25 DIAGNOSIS — I480 Paroxysmal atrial fibrillation: Secondary | ICD-10-CM

## 2022-07-25 DIAGNOSIS — M25512 Pain in left shoulder: Secondary | ICD-10-CM | POA: Diagnosis not present

## 2022-07-25 DIAGNOSIS — Z7901 Long term (current) use of anticoagulants: Secondary | ICD-10-CM | POA: Insufficient documentation

## 2022-07-25 DIAGNOSIS — I110 Hypertensive heart disease with heart failure: Secondary | ICD-10-CM | POA: Diagnosis not present

## 2022-07-25 DIAGNOSIS — E119 Type 2 diabetes mellitus without complications: Secondary | ICD-10-CM

## 2022-07-25 DIAGNOSIS — E785 Hyperlipidemia, unspecified: Secondary | ICD-10-CM | POA: Insufficient documentation

## 2022-07-25 DIAGNOSIS — F419 Anxiety disorder, unspecified: Secondary | ICD-10-CM | POA: Insufficient documentation

## 2022-07-25 DIAGNOSIS — G479 Sleep disorder, unspecified: Secondary | ICD-10-CM | POA: Insufficient documentation

## 2022-07-25 DIAGNOSIS — R69 Illness, unspecified: Secondary | ICD-10-CM | POA: Diagnosis not present

## 2022-07-25 LAB — BASIC METABOLIC PANEL
Anion gap: 10 (ref 5–15)
BUN: 25 mg/dL — ABNORMAL HIGH (ref 8–23)
CO2: 27 mmol/L (ref 22–32)
Calcium: 9.4 mg/dL (ref 8.9–10.3)
Chloride: 102 mmol/L (ref 98–111)
Creatinine, Ser: 1.23 mg/dL — ABNORMAL HIGH (ref 0.44–1.00)
GFR, Estimated: 48 mL/min — ABNORMAL LOW (ref >60)
Glucose, Bld: 126 mg/dL — ABNORMAL HIGH (ref 70–99)
Potassium: 4 mmol/L (ref 3.5–5.1)
Sodium: 139 mmol/L (ref 135–145)

## 2022-07-25 NOTE — Progress Notes (Signed)
Patient ID: Julien Nordmann, female    DOB: 05/05/1956, 66 y.o.   MRN: [id number]  HPI  Ms Thumma is a 66 y/o female with a history of DM, hyperlipidemia, HTN, anxiety, atrial fibrillation, GERD, previous tobacco use and chronic heart failure.   Echo report from 01/05/22 reviewed and showed an EF of 50-55% along with moderate LAE & moderate MR.   Has not been admitted or been in the ED in the last 6 months.   She presents today with a chief complaint of left shoulder pain. Describes this as chronic in nature although occurs on an intermittent basis. She has associated chronic difficulty sleeping along with this. She denies any dizziness, abdominal distention, palpitations, pedal edema, chest pain, shortness of breath, cough, fatigue or weight gain.   Checks her BP at home and it runs 120's/ 60-70's. Just took her medication ~ 1/2 hour before her appointment today.   Past Medical History:  Diagnosis Date   Anxiety    Arthritis    Atrial fibrillation (HCC)    CHF (congestive heart failure) ([place])    Cholecystitis    Diabetes mellitus without complication (HCC)    Dyspnea    GERD (gastroesophageal reflux disease)    Hyperlipidemia    Hypertension    Past Surgical History:  Procedure Laterality Date   CARDIOVERSION N/A 02/13/2022   Procedure: CARDIOVERSION;  Surgeon: Corey Skains, MD;  Location: [facility] ORS;  Service: Cardiovascular;  Laterality: N/A;   COLONOSCOPY  2012   IR CHOLANGIOGRAM EXISTING TUBE  12/11/2021   IR PERC CHOLECYSTOSTOMY  11/02/2021   Family History  Problem Relation Age of Onset   COPD Mother    Hypertension Mother    Diabetes Mother    Severe combined immunodeficiency Father    Diabetes Sister    Heart disease Sister    Heart disease Sister    Cancer Niece    Social History   Tobacco Use   Smoking status: Former    Types: Cigarettes    Quit date: 12/07/1975    Years since quitting: 46.6   Smokeless tobacco: Never  Substance Use Topics   Alcohol use: No    Allergies  Allergen Reactions   Tramadol Other (See Comments)    "woozy"   Penicillins Other (See Comments)    Dizziness Tolerated augmentin but does not like taste   Prior to Admission medications   Medication Sig Start Date End Date Taking? Authorizing Provider  apixaban (ELIQUIS) 5 MG TABS tablet Take 1 tablet (5 mg total) by mouth 2 (two) times daily. 06/19/22  Yes Cannady, Henrine Screws T, NP  Calcium Carb-Cholecalciferol (CALCIUM 600+D3) 600-200 MG-UNIT TABS Take 1 tablet by mouth in the morning.   Yes [provider]  carvedilol (COREG) 12.5 MG tablet Take 1 tablet (12.5 mg total) by mouth 2 (two) times daily with a meal. 06/19/22  Yes Cannady, [name], NP  empagliflozin (JARDIANCE) 10 MG TABS tablet Take 1 tablet (10 mg total) by mouth daily before breakfast. 06/19/22  Yes Cannady, [name], NP  ferrous sulfate 325 (65 FE) MG tablet Take 1 tablet (325 mg total) by mouth daily. 01/08/22  Yes Bonnielee Haff, MD  furosemide (LASIX) 40 MG tablet Take 40mg  twice daily for 4 days, then take 40mg  once daily. Patient taking differently: Take 40 mg by mouth daily. Take 40mg  twice daily for 4 days, then take 40mg  once daily. 01/08/22  Yes Bonnielee Haff, MD  Multiple Vitamin (MULTIVITAMIN) tablet Take 1 tablet  by mouth daily.   Yes [provider]  omeprazole (PRILOSEC) 20 MG capsule Take 20 mg by mouth daily.   Yes [provider]  potassium chloride SA (KLOR-CON M) 20 MEQ tablet Take 1 tablet (20 mEq total) by mouth daily. 05/21/22  Yes Vernelle Wisner A, FNP  sacubitril-valsartan (ENTRESTO) 24-26 MG Take 1 tablet by mouth 2 (two) times daily. 05/22/22  Yes Madelene Kaatz A, FNP  Semaglutide, 1 MG/DOSE, 4 MG/3ML SOPN Inject 1 mg as directed once a week. 07/24/22  Yes Larae Grooms, NP  vitamin B-12 (CYANOCOBALAMIN) 500 MCG tablet Take 500 mcg by mouth daily.   Yes [provider]  albuterol (VENTOLIN HFA) 108 (90 Base) MCG/ACT inhaler Inhale 2 puffs into the lungs  every 6 (six) hours as needed for wheezing or shortness of breath. Patient not taking: Reported on 07/25/2022 12/27/21   Loura Pardon, MD    Review of Systems  Constitutional:  Negative for appetite change and fatigue.  HENT:  Negative for congestion, postnasal drip and sore throat.   Eyes: Negative.   Respiratory:  Negative for cough, chest tightness and shortness of breath.   Cardiovascular:  Negative for chest pain, palpitations and leg swelling.  Gastrointestinal:  Negative for abdominal distention and abdominal pain.  Endocrine: Negative.   Genitourinary: Negative.   Musculoskeletal:  Positive for arthralgias (left shoulder). Negative for back pain and neck pain.  Skin: Negative.   Allergic/Immunologic: Negative.   Neurological:  Negative for dizziness and light-headedness.  Hematological:  Negative for adenopathy. Does not bruise/bleed easily.  Psychiatric/Behavioral:  Positive for sleep disturbance (sleeping on 2 pillows). Negative for dysphoric mood. The patient is not nervous/anxious.    Vitals:   07/25/22 0814  BP: (!) 156/54  Pulse: 64  Resp: 14  SpO2: 100%  Weight: 167 lb 6 oz (75.9 kg)  Height: 5\' 1"  (1.549 m)   Wt Readings from Last 3 Encounters:  07/25/22 167 lb 6 oz (75.9 kg)  07/03/22 167 lb 14.4 oz (76.2 kg)  06/25/22 167 lb 4 oz (75.9 kg)   Lab Results  Component Value Date   CREATININE 1.23 (H) 07/25/2022   CREATININE 1.45 (H) 07/03/2022   CREATININE 1.28 (H) 06/25/2022   Physical Exam Vitals and nursing note reviewed.  Constitutional:      Appearance: Normal appearance.  HENT:     Head: Normocephalic and atraumatic.  Cardiovascular:     Rate and Rhythm: Normal rate and regular rhythm.  Pulmonary:     Effort: Pulmonary effort is normal. No respiratory distress.     Breath sounds: No wheezing or rales.  Abdominal:     General: There is no distension.     Palpations: Abdomen is soft.     Tenderness: There is no abdominal tenderness.   Musculoskeletal:        General: No tenderness.     Cervical back: Normal range of motion and neck supple.     Right lower leg: No edema.     Left lower leg: No edema.  Skin:    General: Skin is warm and dry.  Neurological:     General: No focal deficit present.     Mental Status: She is alert and oriented to person, place, and time.  Psychiatric:        Mood and Affect: Mood normal.        Behavior: Behavior normal.        Thought Content: Thought content normal.    Assessment & Plan:  1: Chronic heart failure with preserved ejection fraction with structural changes (LAE)- - NYHA class I - euvolemic - weighing daily; reminded to call for an overnight weight gain of > 2 pounds or a weekly weight gain of > 5 pounds - weight unchanged from last visit here 1 month ago - does add salt to her eggs but says that she's trying to not add any salt to anything else; trying to keep daily sodium intake to ~ 2000mg  / day - on GDMT of jardiance & entresto - BNP 01/04/22 was 537.6  2: HTN- - BP elevated (156/54) but she just took her meds ~ 1/2 before appointment - home BP's running 120's/ 60-70's - saw PCP ([facility]orht) 07/03/22 - BMP 07/03/22 reviewed and showed sodium 142, potassium 4.1, creatinine 1.45 and GFR 40 - will recheck BMP today since renal function is declining some  3: DM- - A1c 01/22/22 was 6.9% - fasting glucose at home today was 130  4: Atrial fibrillation- - saw cardiology(Kowalski) 06/27/22 - currently on apixaban and amiodarone    Medication bottles reviewed.   Return in 4 months, sooner if needed.

## 2022-07-25 NOTE — Patient Instructions (Signed)
Continue weighing daily and call for an overnight weight gain of 3 pounds or more or a weekly weight gain of more than 5 pounds  If you have voicemail, please make sure your mailbox is cleaned out so that we may leave a message and please make sure to listen to any voicemails.    If you receive a satisfaction survey regarding the [facility], please take the time to fill it out. This way we can continue to provide excellent care and make any changes that need to be made.     

## 2022-07-29 ENCOUNTER — Telehealth: Payer: Self-pay

## 2022-07-29 ENCOUNTER — Telehealth: Payer: Self-pay | Admitting: Nurse Practitioner

## 2022-07-29 NOTE — Telephone Encounter (Signed)
Received message from our pharmacy team asking if we had samples of Ozempic 1 mg. The only samples we have in the office are the 0.25/0.5 mg or the 2 mg. Message sent to Jon Billings, NP asking if the patient could be given 2 of the 0.5 mg dose pens to equal her normal dose of 1 mg. Patient would have to do 2 injections of the 0.5 mg.   Called and LVM asking for patient to please return my call. Please find out if the patient would be ok with doing 2 of the 0.5 mg injections. If she is ok with this, she can come by and pick up the samples.

## 2022-07-29 NOTE — Telephone Encounter (Signed)
Patient contacted via phone call.

## 2022-07-29 NOTE — Telephone Encounter (Signed)
Pts pharmacy stated that Ozempic is on back order / pt would like to know if any samples are available at the office until they are back in stock / please advise

## 2022-07-29 NOTE — Telephone Encounter (Signed)
Patient called back and states that she would be fine with doing 2 of the 0.5 mg injections. States she will be by in the morning to pick up the samples.   2 boxes of Ozempic signed out for the patient.

## 2022-07-30 ENCOUNTER — Ambulatory Visit (INDEPENDENT_AMBULATORY_CARE_PROVIDER_SITE_OTHER): Payer: Medicare HMO

## 2022-07-30 DIAGNOSIS — Z23 Encounter for immunization: Secondary | ICD-10-CM

## 2022-08-06 DIAGNOSIS — I480 Paroxysmal atrial fibrillation: Secondary | ICD-10-CM | POA: Diagnosis not present

## 2022-08-06 DIAGNOSIS — E1169 Type 2 diabetes mellitus with other specified complication: Secondary | ICD-10-CM | POA: Diagnosis not present

## 2022-08-06 DIAGNOSIS — I10 Essential (primary) hypertension: Secondary | ICD-10-CM | POA: Diagnosis not present

## 2022-08-06 DIAGNOSIS — E785 Hyperlipidemia, unspecified: Secondary | ICD-10-CM | POA: Diagnosis not present

## 2022-08-06 DIAGNOSIS — I5032 Chronic diastolic (congestive) heart failure: Secondary | ICD-10-CM | POA: Diagnosis not present

## 2022-08-25 ENCOUNTER — Other Ambulatory Visit: Payer: Self-pay | Admitting: Family

## 2022-08-27 NOTE — Progress Notes (Signed)
Chronic Care Management Pharmacy Assistant   Name: LAJOY VANAMBURG  MRN: [id number] DOB: Nov 15, 1955   Medications: Outpatient Encounter Medications as of 07/29/2022  Medication Sig Note   albuterol (VENTOLIN HFA) 108 (90 Base) MCG/ACT inhaler Inhale 2 puffs into the lungs every 6 (six) hours as needed for wheezing or shortness of breath. (Patient not taking: Reported on 07/25/2022)    apixaban (ELIQUIS) 5 MG TABS tablet Take 1 tablet (5 mg total) by mouth 2 (two) times daily.    Calcium Carb-Cholecalciferol (CALCIUM 600+D3) 600-200 MG-UNIT TABS Take 1 tablet by mouth in the morning.    carvedilol (COREG) 12.5 MG tablet Take 1 tablet (12.5 mg total) by mouth 2 (two) times daily with a meal.    empagliflozin (JARDIANCE) 10 MG TABS tablet Take 1 tablet (10 mg total) by mouth daily before breakfast.    ferrous sulfate 325 (65 FE) MG tablet Take 1 tablet (325 mg total) by mouth daily.    furosemide (LASIX) 40 MG tablet Take 40mg  twice daily for 4 days, then take 40mg  once daily. (Patient taking differently: Take 40 mg by mouth daily. Take 40mg  twice daily for 4 days, then take 40mg  once daily.) 06/25/2022: Taking half a tablet daily and then one full tablet if needed for weight gain or swelling    Multiple Vitamin (MULTIVITAMIN) tablet Take 1 tablet by mouth daily.    omeprazole (PRILOSEC) 20 MG capsule Take 20 mg by mouth daily.    potassium chloride SA (KLOR-CON M) 20 MEQ tablet Take 1 tablet (20 mEq total) by mouth daily.    Semaglutide, 1 MG/DOSE, 4 MG/3ML SOPN Inject 1 mg as directed once a week.    vitamin B-12 (CYANOCOBALAMIN) 500 MCG tablet Take 500 mcg by mouth daily.    [DISCONTINUED] sacubitril-valsartan (ENTRESTO) 24-26 MG Take 1 tablet by mouth 2 (two) times daily.    No facility-administered encounter medications on file as of 07/29/2022.   Reviewed chart for medication changes ahead of medication coordination call.  No OVs, Consults, or hospital visits since last care coordination  call/Pharmacist visit. (If appropriate, list visit date, provider name)  No medication changes indicated OR if recent visit, treatment plan here.  BP Readings from Last 3 Encounters:  07/25/22 (!) 156/54  07/03/22 122/74  06/25/22 (!) 158/57    Lab Results  Component Value Date   HGBA1C 6.6 (H) 07/03/2022    Reviewed chart for medication changes ahead of medication coordination call.  No OVs, Consults, or hospital visits since last care coordination call/Pharmacist visit.   No medication changes indicated    Patient obtains medications through Vials  30 Days   Last adherence delivery included: Amiodarone 200 mg Carvedilol 12.5 mg 1 tab Breakfast and 1 tab at Bedtime Eliquis 5 mg 1 tab Breakfast and 1 tab Bedtime Potassium Chloride ER 20 meq 1 tab at breakfast Jardiance 10 mg 1 tab at breakfast Entresto 24-26 mg 1 tab Breakfast and 1 tab Bedtime   Patient is due for next adherence delivery on: 09/03/22.  Called patient and reviewed medications and coordinated delivery. This delivery to include: Carvedilol 12.5 mg 1 tab Breakfast and 1 tab at Bedtime Potassium Chloride ER 20 meq 1 tab at breakfast Jardiance 10 mg 1 tab at breakfast Entresto 24-26 mg 1 tab Breakfast and 1 tab Bedtime   Patient declined the following medications (Amiodarone and Eliquis) due to (no longer taking)   Confirmed delivery date of 09/03/22, advised patient that pharmacy will contact them  the morning of delivery.    Velvet [facility] Concierge [phone]

## 2022-09-06 ENCOUNTER — Telehealth: Payer: Self-pay

## 2022-09-23 ENCOUNTER — Telehealth: Payer: Self-pay

## 2022-09-23 NOTE — Progress Notes (Signed)
    Chronic Care Management Pharmacy Assistant   Name: Amanda [name]  MRN: [id number] DOB: 08/01/1956   Reason for Encounter: Medication Review    Medications: Outpatient Encounter Medications as of 09/23/2022  Medication Sig Note   albuterol (VENTOLIN HFA) 108 (90 Base) MCG/ACT inhaler Inhale 2 puffs into the lungs every 6 (six) hours as needed for wheezing or shortness of breath. (Patient not taking: Reported on 07/25/2022)    apixaban (ELIQUIS) 5 MG TABS tablet Take 1 tablet (5 mg total) by mouth 2 (two) times daily.    Calcium Carb-Cholecalciferol (CALCIUM 600+D3) 600-200 MG-UNIT TABS Take 1 tablet by mouth in the morning.    carvedilol (COREG) 12.5 MG tablet Take 1 tablet (12.5 mg total) by mouth 2 (two) times daily with a meal.    empagliflozin (JARDIANCE) 10 MG TABS tablet Take 1 tablet (10 mg total) by mouth daily before breakfast.    ferrous sulfate 325 (65 FE) MG tablet Take 1 tablet (325 mg total) by mouth daily.    furosemide (LASIX) 40 MG tablet Take 40mg  twice daily for 4 days, then take 40mg  once daily. (Patient taking differently: Take 40 mg by mouth daily. Take 40mg  twice daily for 4 days, then take 40mg  once daily.) 06/25/2022: Taking half a tablet daily and then one full tablet if needed for weight gain or swelling    Multiple Vitamin (MULTIVITAMIN) tablet Take 1 tablet by mouth daily.    omeprazole (PRILOSEC) 20 MG capsule Take 20 mg by mouth daily.    potassium chloride SA (KLOR-CON M) 20 MEQ tablet Take 1 tablet (20 mEq total) by mouth daily.    sacubitril-valsartan (ENTRESTO) 24-26 MG TAKE ONE TABLET BY MOUTH TWICE DAILY    Semaglutide, 1 MG/DOSE, 4 MG/3ML SOPN Inject 1 mg as directed once a week.    vitamin B-12 (CYANOCOBALAMIN) 500 MCG tablet Take 500 mcg by mouth daily.    No facility-administered encounter medications on file as of 09/23/2022.   Reviewed chart for medication changes ahead of medication coordination call.  No OVs, Consults, or hospital visits since  last care coordination call/Pharmacist visit. (If appropriate, list visit date, provider name)  No medication changes indicated OR if recent visit, treatment plan here.  BP Readings from Last 3 Encounters:  07/25/22 (!) 156/54  07/03/22 122/74  06/25/22 (!) 158/57    Lab Results  Component Value Date   HGBA1C 6.6 (H) 07/03/2022    Reviewed chart for medication changes ahead of medication coordination call.  No OVs, Consults, or hospital visits since last care coordination call/Pharmacist visit.  No medication changes indicated   Patient obtains medications through Adherence Packaging  30 Days   Last adherence delivery included:  Entresto 24-26 mg 1 tab twice daily Carvedilol 12.5 mg 1 tab twice daily Jardiance 10 mg 1 tab before breakfast Potassium chloride er 20 meq 1 tab once daily   Patient is due for next adherence delivery on: 10/03/22.  Called patient and reviewed medications and coordinated delivery. This delivery to include: Entresto 24-26 mg 1 tab twice daily Carvedilol 12.5 mg 1 tab twice daily Jardiance 10 mg 1 tab before breakfast Potassium chloride er 20 meq 1 tab once daily   Confirmed delivery date of 10/03/22, advised patient that pharmacy will contact them the morning of delivery.    08/25/22 Health Concierge [phone]

## 2022-10-01 DIAGNOSIS — H524 Presbyopia: Secondary | ICD-10-CM | POA: Diagnosis not present

## 2022-10-01 DIAGNOSIS — E119 Type 2 diabetes mellitus without complications: Secondary | ICD-10-CM | POA: Diagnosis not present

## 2022-10-01 LAB — HM DIABETES EYE EXAM

## 2022-10-01 NOTE — Progress Notes (Unsigned)
There were no vitals taken for this visit.   Subjective:    Patient ID: Amanda [name], female    DOB: [birth year], 66 y.o.   MRN: [id number]  HPI: Amanda [name] is a 66 y.o. female  NOTE WRITTEN BY DNP STUDENT.  ASSESSMENT AND PLAN OF CARE REVIEWED WITH STUDENT, AGREE WITH ABOVE FINDINGS AND PLAN.   No chief complaint on file.  HYPERTENSION / HYPERLIPIDEMIA/HF Satisfied with current treatment? yes Duration of hypertension: chronic BP monitoring frequency: a few times a week BP range: 119-125 BP medication side effects: no Past BP meds: carvedilol Duration of hyperlipidemia: chronic Cholesterol medication side effects: no Cholesterol supplements: none Past cholesterol medications: none Medication compliance: Compliant Aspirin: no Recent stressors: no Recurrent headaches: no Visual changes: no Palpitations: no Dyspnea: no Chest pain: no Lower extremity edema: yes Dizzy/lightheaded: no  DIABETES Hypoglycemic episodes:no Polydipsia/polyuria: yes Visual disturbance: no Chest pain: no Paresthesias: no Glucose Monitoring: yes  Accucheck frequency:  1-2x a week  Fasting glucose:145  Post prandial:  Evening:  Before meals: Taking Insulin?: no  Long acting insulin:  Short acting insulin: Blood Pressure Monitoring: a few times a week Retinal Examination: Up to date; due NOV 2023 Foot Exam: Up to Date; due NOV 2023 Diabetic Education: Completed Pneumovax:  not yet Influenza: Not up to Date Aspirin: no  CHRONIC KIDNEY DISEASE CKD status: stable Medications renally dose: yes Previous renal evaluation: yes; inpatient March 2023 Pneumovax:  Not up to Date Influenza Vaccine:  Not up to Date  ANEMIA Anemia status: stable Etiology of anemia: Duration of anemia treatment:  Compliance with treatment: excellent compliance Iron supplementation side effects: no Severity of anemia: mild Fatigue: yes Decreased exercise tolerance: no  Dyspnea on exertion:  no Palpitations: no Bleeding: no Pica: no   Relevant past medical, surgical, family and social history reviewed and updated as indicated. Interim medical history since our last visit reviewed. Allergies and medications reviewed and updated.  Review of Systems  Constitutional:  Negative for activity change, appetite change, chills, diaphoresis, fatigue, fever and unexpected weight change.  HENT:  Negative for congestion.   Respiratory:  Negative for cough, chest tightness and shortness of breath.   Cardiovascular:  Negative for chest pain, palpitations and leg swelling.  Gastrointestinal:  Negative for abdominal distention, abdominal pain, constipation, diarrhea, nausea and vomiting.  Endocrine: Positive for polyuria (lasix, jardiance). Negative for polydipsia and polyphagia.  Genitourinary:  Negative for difficulty urinating, dysuria, flank pain and frequency.  Musculoskeletal:  Negative for arthralgias.  Neurological:  Negative for dizziness, syncope, weakness and headaches.    Per HPI unless specifically indicated above     Objective:    There were no vitals taken for this visit.  Wt Readings from Last 3 Encounters:  07/25/22 167 lb 6 oz (75.9 kg)  07/03/22 167 lb 14.4 oz (76.2 kg)  06/25/22 167 lb 4 oz (75.9 kg)    Physical Exam Constitutional:      Appearance: Normal appearance. She is obese.  HENT:     Head: Normocephalic.     Mouth/Throat:     Mouth: Mucous membranes are moist.  Eyes:     Pupils: Pupils are equal, round, and reactive to light.  Cardiovascular:     Rate and Rhythm: Normal rate and regular rhythm.     Pulses: Normal pulses.     Heart sounds: Normal heart sounds. No murmur heard.    No gallop.  Pulmonary:     Effort: Pulmonary effort is normal.  No respiratory distress.     Breath sounds: Normal breath sounds. No stridor. No wheezing or rhonchi.  Chest:     Chest wall: No tenderness.  Abdominal:     General: Bowel sounds are normal.      Palpations: Abdomen is soft.     Tenderness: There is no abdominal tenderness.  Musculoskeletal:     Cervical back: Normal range of motion.     Right lower leg: Edema (Non-pitting) present.     Left lower leg: Edema (Non-pitting) present.  Skin:    General: Skin is warm and dry.     Capillary Refill: Capillary refill takes 2 to 3 seconds.  Neurological:     General: No focal deficit present.     Mental Status: She is alert and oriented to person, place, and time. Mental status is at baseline.  Psychiatric:        Mood and Affect: Mood normal.        Behavior: Behavior normal.        Thought Content: Thought content normal.        Judgment: Judgment normal.    Results for orders placed or performed in visit on 07/25/22  Basic metabolic panel  Result Value Ref Range   Sodium 139 135 - 145 mmol/L   Potassium 4.0 3.5 - 5.1 mmol/L   Chloride 102 98 - 111 mmol/L   CO2 27 22 - 32 mmol/L   Glucose, Bld 126 (H) 70 - 99 mg/dL   BUN 25 (H) 8 - 23 mg/dL   Creatinine, Ser 2.56 (H) 0.44 - 1.00 mg/dL   Calcium 9.4 8.9 - 38.9 mg/dL   GFR, Estimated 48 (L) >60 mL/min   Anion gap 10 5 - 15      Assessment & Plan:   Problem List Items Addressed This Visit       Cardiovascular and Mediastinum   Hypertension associated with diabetes (HCC)   AF (paroxysmal atrial fibrillation) (HCC) - Primary     Follow up plan: No follow-ups on file.

## 2022-10-02 ENCOUNTER — Ambulatory Visit (INDEPENDENT_AMBULATORY_CARE_PROVIDER_SITE_OTHER): Payer: Medicare HMO | Admitting: Nurse Practitioner

## 2022-10-02 ENCOUNTER — Encounter: Payer: Self-pay | Admitting: Nurse Practitioner

## 2022-10-02 VITALS — BP 132/63 | HR 67 | Temp 98.7°F | Wt 167.7 lb

## 2022-10-02 DIAGNOSIS — N1831 Chronic kidney disease, stage 3a: Secondary | ICD-10-CM | POA: Diagnosis not present

## 2022-10-02 DIAGNOSIS — E1159 Type 2 diabetes mellitus with other circulatory complications: Secondary | ICD-10-CM | POA: Diagnosis not present

## 2022-10-02 DIAGNOSIS — I480 Paroxysmal atrial fibrillation: Secondary | ICD-10-CM

## 2022-10-02 DIAGNOSIS — E785 Hyperlipidemia, unspecified: Secondary | ICD-10-CM

## 2022-10-02 DIAGNOSIS — D649 Anemia, unspecified: Secondary | ICD-10-CM | POA: Diagnosis not present

## 2022-10-02 DIAGNOSIS — I152 Hypertension secondary to endocrine disorders: Secondary | ICD-10-CM | POA: Diagnosis not present

## 2022-10-02 DIAGNOSIS — D61818 Other pancytopenia: Secondary | ICD-10-CM

## 2022-10-02 DIAGNOSIS — Z23 Encounter for immunization: Secondary | ICD-10-CM | POA: Diagnosis not present

## 2022-10-02 DIAGNOSIS — E6609 Other obesity due to excess calories: Secondary | ICD-10-CM | POA: Diagnosis not present

## 2022-10-02 DIAGNOSIS — E538 Deficiency of other specified B group vitamins: Secondary | ICD-10-CM | POA: Diagnosis not present

## 2022-10-02 DIAGNOSIS — E1169 Type 2 diabetes mellitus with other specified complication: Secondary | ICD-10-CM | POA: Diagnosis not present

## 2022-10-02 DIAGNOSIS — E1165 Type 2 diabetes mellitus with hyperglycemia: Secondary | ICD-10-CM | POA: Diagnosis not present

## 2022-10-02 DIAGNOSIS — Z6832 Body mass index (BMI) 32.0-32.9, adult: Secondary | ICD-10-CM | POA: Diagnosis not present

## 2022-10-02 LAB — MICROALBUMIN, URINE WAIVED
Creatinine, Urine Waived: 50 mg/dL (ref 10–300)
Microalb, Ur Waived: 150 mg/L — ABNORMAL HIGH (ref 0–19)
Microalb/Creat Ratio: >300 mg/g — ABNORMAL HIGH (ref <30)

## 2022-10-02 MED ORDER — FUROSEMIDE 20 MG PO TABS: 20.0000 mg | ORAL_TABLET | Freq: Every day | ORAL | 1 refills | Status: DC

## 2022-10-02 MED ORDER — ROSUVASTATIN CALCIUM 5 MG PO TABS: 5.0000 mg | ORAL_TABLET | Freq: Every day | ORAL | 1 refills | Status: DC

## 2022-10-02 NOTE — Assessment & Plan Note (Signed)
Pt in normal rate/rhythm in office. Patient states she was taken off Amiodarone and Eliquis by Dr. Kowalski.  Will continue to monitor at future visits. 

## 2022-10-02 NOTE — Assessment & Plan Note (Signed)
Labs ordered at visit today.  Will make recommendations based on lab results.   

## 2022-10-02 NOTE — Assessment & Plan Note (Signed)
Chronic. Well controlled. Pt tolerating adding of ozempic. Denies any adverse side effects at this time. Will obtain eye exam results.  No change is required for any medications.  Follow up in 3 months.  Call sooner if concerns arise.

## 2022-10-02 NOTE — Assessment & Plan Note (Signed)
BP WNL today. BP check/log reviewed, pt blood pressure well controlled. No changes made to current regimen. Labs ordered today. Will make recommendations based on lab results.

## 2022-10-02 NOTE — Assessment & Plan Note (Signed)
Lipid panel drawn in clinic today.  Will start Crestor 5mg daily.  Side effects and benefits of medication discussed during visit.  Will increase as patient tolerates the medication.  Follow up in 3 months.  Call sooner if concerns arise.  

## 2022-10-02 NOTE — Assessment & Plan Note (Signed)
Recommended eating smaller high protein, low fat meals more frequently and exercising 30 mins a day 5 times a week with a goal of 10-15lb weight loss in the next 3 months. Patient voiced their understanding and motivation to adhere to these recommendations.  

## 2022-10-03 LAB — CBC WITH DIFFERENTIAL/PLATELET
Basophils Absolute: 0 10*3/uL (ref 0.0–0.2)
Basos: 1 %
EOS (ABSOLUTE): 0.1 10*3/uL (ref 0.0–0.4)
Eos: 2 %
Hematocrit: 35.6 % (ref 34.0–46.6)
Hemoglobin: 12.1 g/dL (ref 11.1–15.9)
Immature Grans (Abs): 0 10*3/uL (ref 0.0–0.1)
Immature Granulocytes: 0 %
Lymphocytes Absolute: 2 10*3/uL (ref 0.7–3.1)
Lymphs: 31 %
MCH: 31.6 pg (ref 26.6–33.0)
MCHC: 34 g/dL (ref 31.5–35.7)
MCV: 93 fL (ref 79–97)
Monocytes Absolute: 0.4 10*3/uL (ref 0.1–0.9)
Monocytes: 7 %
Neutrophils Absolute: 3.7 10*3/uL (ref 1.4–7.0)
Neutrophils: 59 %
Platelets: 186 10*3/uL (ref 150–450)
RBC: 3.83 x10E6/uL (ref 3.77–5.28)
RDW: 12.8 % (ref 11.7–15.4)
WBC: 6.2 10*3/uL (ref 3.4–10.8)

## 2022-10-03 LAB — COMPREHENSIVE METABOLIC PANEL
ALT: 13 IU/L (ref 0–32)
AST: 23 IU/L (ref 0–40)
Albumin/Globulin Ratio: 1.6 (ref 1.2–2.2)
Albumin: 4.1 g/dL (ref 3.9–4.9)
Alkaline Phosphatase: 77 IU/L (ref 44–121)
BUN/Creatinine Ratio: 15 (ref 12–28)
BUN: 20 mg/dL (ref 8–27)
Bilirubin Total: 0.4 mg/dL (ref 0.0–1.2)
CO2: 24 mmol/L (ref 20–29)
Calcium: 9.2 mg/dL (ref 8.7–10.3)
Chloride: 103 mmol/L (ref 96–106)
Creatinine, Ser: 1.36 mg/dL — ABNORMAL HIGH (ref 0.57–1.00)
Globulin, Total: 2.6 g/dL (ref 1.5–4.5)
Glucose: 102 mg/dL — ABNORMAL HIGH (ref 70–99)
Potassium: 4.2 mmol/L (ref 3.5–5.2)
Sodium: 140 mmol/L (ref 134–144)
Total Protein: 6.7 g/dL (ref 6.0–8.5)
eGFR: 43 mL/min/{1.73_m2} — ABNORMAL LOW (ref >59)

## 2022-10-03 LAB — LIPID PANEL
Chol/HDL Ratio: 3.7 ratio (ref 0.0–4.4)
Cholesterol, Total: 219 mg/dL — ABNORMAL HIGH (ref 100–199)
HDL: 60 mg/dL (ref >39)
LDL Chol Calc (NIH): 131 mg/dL — ABNORMAL HIGH (ref 0–99)
Triglycerides: 156 mg/dL — ABNORMAL HIGH (ref 0–149)
VLDL Cholesterol Cal: 28 mg/dL (ref 5–40)

## 2022-10-03 LAB — VITAMIN B12: Vitamin B-12: >2000 pg/mL — ABNORMAL HIGH (ref 232–1245)

## 2022-10-03 LAB — HEMOGLOBIN A1C
Est. average glucose Bld gHb Est-mCnc: 143 mg/dL
Hgb A1c MFr Bld: 6.6 % — ABNORMAL HIGH (ref 4.8–5.6)

## 2022-10-03 NOTE — Progress Notes (Signed)
Hi Amanda [name]. It was nice to see you yesterday.  Your lab work looks good.  Your A1c is well controlled at 6.6.  Your kidney function remains stable.  Make sure to drink plenty of water and avoid any motrin, aleve or ibuprofen.  Your cholesterol is elevated but I believe starting the rosuvastatin will help with this.  No other concerns at this time. Continue with your current medication regimen.  Follow up as discussed.  Please let me know if you have any questions.

## 2022-10-21 ENCOUNTER — Other Ambulatory Visit: Payer: Self-pay | Admitting: Family

## 2022-10-22 ENCOUNTER — Telehealth: Payer: Self-pay

## 2022-10-22 NOTE — Progress Notes (Cosign Needed)
Reason for Encounter: Medication coordination and delivery  Contacted patient on 10/22/2022 to discuss medications    Medications: Outpatient Encounter Medications as of 10/22/2022  Medication Sig   albuterol (VENTOLIN HFA) 108 (90 Base) MCG/ACT inhaler Inhale 2 puffs into the lungs every 6 (six) hours as needed for wheezing or shortness of breath. (Patient not taking: Reported on 07/25/2022)   Calcium Carb-Cholecalciferol (CALCIUM 600+D3) 600-200 MG-UNIT TABS Take 1 tablet by mouth in the morning.   carvedilol (COREG) 12.5 MG tablet Take 1 tablet (12.5 mg total) by mouth 2 (two) times daily with a meal.   empagliflozin (JARDIANCE) 10 MG TABS tablet Take 1 tablet (10 mg total) by mouth daily before breakfast.   ferrous sulfate 325 (65 FE) MG tablet Take 1 tablet (325 mg total) by mouth daily.   furosemide (LASIX) 20 MG tablet Take 1 tablet (20 mg total) by mouth daily.   Multiple Vitamin (MULTIVITAMIN) tablet Take 1 tablet by mouth daily.   omeprazole (PRILOSEC) 20 MG capsule Take 20 mg by mouth daily.   potassium chloride SA (KLOR-CON M) 20 MEQ tablet TAKE ONE TABLET BY MOUTH EVERY MORNING   rosuvastatin (CRESTOR) 5 MG tablet Take 1 tablet (5 mg total) by mouth daily.   sacubitril-valsartan (ENTRESTO) 24-26 MG TAKE ONE TABLET BY MOUTH TWICE DAILY   Semaglutide, 1 MG/DOSE, 4 MG/3ML SOPN Inject 1 mg as directed once a week.   vitamin B-12 (CYANOCOBALAMIN) 500 MCG tablet Take 500 mcg by mouth daily.   No facility-administered encounter medications on file as of 10/22/2022.   BP Readings from Last 3 Encounters:  10/02/22 132/63  07/25/22 (!) 156/54  07/03/22 122/74    Pulse Readings from Last 3 Encounters:  10/02/22 67  07/25/22 64  07/03/22 61    Lab Results  Component Value Date/Time   HGBA1C 6.6 (H) 10/02/2022 10:01 AM   HGBA1C 6.6 (H) 07/03/2022 02:58 PM   HGBA1C 6.9 (H) 01/22/2022 03:20 PM   HGBA1C 7.0 (H) 11/22/2021 03:42 PM   HGBA1C 7.8 06/12/2016 12:00 AM   Lab Results   Component Value Date   CREATININE 1.36 (H) 10/02/2022   BUN 20 10/02/2022   GFRNONAA 48 (L) 07/25/2022   GFRAA 59 (L) 09/05/2020   NA 140 10/02/2022   K 4.2 10/02/2022   CALCIUM 9.2 10/02/2022   CO2 24 10/02/2022     Last adherence delivery date:09/23/22  Patient is due for next adherence delivery on:11/01/22  Spoke with patient on 10/22/22 reviewed medications and coordinated delivery.  This delivery to include: Vials  30 Days    Patient declined the following medications this month:N/a  No refill request needed.  Confirmed delivery date of 11/01/22, advised patient that pharmacy will contact them the morning of delivery.   Any concerns about your medications? No  How often do you forget or accidentally miss a dose? Never  Is patient in packaging No  If yes  What is the date on your next pill pack?  Any concerns or issues with your packaging?   Recent blood pressure readings are as follows:10/02/22-132/63   Cycle dispensing form sent to [facility] for review.    Corrie Mckusick, RMA

## 2022-11-05 ENCOUNTER — Telehealth: Payer: Self-pay

## 2022-11-05 NOTE — Progress Notes (Cosign Needed)
Contacted [employer] [facility]  to follow up on patient assistance application for Jardiance. Per representative at [facility] cares states  that date on the application (Pg 4)  has expired and a new Rx needs to be faxed with current date     Ethelene Hal

## 2022-11-07 NOTE — Progress Notes (Cosign Needed)
Contacted [employer] [facility]  to follow up on patient assistance application for Jardiance. Per representative at [facility] cares states  patient was referred to low income subsidy.Once patient applies she will need to fax approval or denial letter to [facility] with patient who states that she pays $47 monthly for this medication. She will let me know if she will apply for the subsidy.  Ethelene Hal

## 2022-12-02 NOTE — Progress Notes (Unsigned)
Patient ID: Amanda [name], female    DOB: 1956-02-05, 67 y.o.   MRN: [id number]  HPI  Amanda [name] is a 67 y/o female with a history of DM, hyperlipidemia, HTN, anxiety, atrial fibrillation, GERD, previous tobacco use and chronic heart failure.   Echo report from 01/05/22 reviewed and showed an EF of 50-55% along with moderate LAE & moderate MR.   Has not been admitted or been in the ED in the last 6 months.   She presents today with a chief complaint of left shoulder pain. Describes this as chronic in nature. Has no other symptoms and specifically denies any difficulty sleeping, abdominal distention, palpitations, pedal edema, chest pain, SOB, cough, fatigue, dizziness or weight gain.   Past Medical History:  Diagnosis Date   Anxiety    Arthritis    Atrial fibrillation (HCC)    CHF (congestive heart failure) ([place])    Cholecystitis    Diabetes mellitus without complication (HCC)    Dyspnea    GERD (gastroesophageal reflux disease)    Hyperlipidemia    Hypertension    Past Surgical History:  Procedure Laterality Date   CARDIOVERSION N/A 02/13/2022   Procedure: CARDIOVERSION;  Surgeon: Corey Skains, MD;  Location: [facility] ORS;  Service: Cardiovascular;  Laterality: N/A;   COLONOSCOPY  2012   IR CHOLANGIOGRAM EXISTING TUBE  12/11/2021   IR PERC CHOLECYSTOSTOMY  11/02/2021   Family History  Problem Relation Age of Onset   COPD Mother    Hypertension Mother    Diabetes Mother    Severe combined immunodeficiency Father    Diabetes Sister    Heart disease Sister    Heart disease Sister    Cancer Niece    Social History   Tobacco Use   Smoking status: Former    Types: Cigarettes    Quit date: 12/07/1975    Years since quitting: 47.0   Smokeless tobacco: Never  Substance Use Topics   Alcohol use: No   Allergies  Allergen Reactions   Tramadol Other (See Comments)    "woozy"   Penicillins Other (See Comments)    Dizziness Tolerated augmentin but does not like taste   Prior  to Admission medications   Medication Sig Start Date End Date Taking? Authorizing Provider  Calcium Carb-Cholecalciferol (CALCIUM 600+D3) 600-200 MG-UNIT TABS Take 1 tablet by mouth in the morning.   Yes [provider]  carvedilol (COREG) 12.5 MG tablet Take 1 tablet (12.5 mg total) by mouth 2 (two) times daily with a meal. 06/19/22  Yes Cannady, [name], NP  empagliflozin (JARDIANCE) 10 MG TABS tablet Take 1 tablet (10 mg total) by mouth daily before breakfast. 06/19/22  Yes Cannady, [name], NP  ferrous sulfate 325 (65 FE) MG tablet Take 1 tablet (325 mg total) by mouth daily. 01/08/22  Yes Bonnielee Haff, MD  furosemide (LASIX) 20 MG tablet Take 1 tablet (20 mg total) by mouth daily. 10/02/22  Yes Jon Billings, NP  Multiple Vitamin (MULTIVITAMIN) tablet Take 1 tablet by mouth daily.   Yes [provider]  omeprazole (PRILOSEC) 20 MG capsule Take 20 mg by mouth daily.   Yes [provider]  potassium chloride SA (KLOR-CON M) 20 MEQ tablet TAKE ONE TABLET BY MOUTH EVERY MORNING 10/21/22  Yes Darylene Price A, FNP  rosuvastatin (CRESTOR) 5 MG tablet Take 1 tablet (5 mg total) by mouth daily. 10/02/22  Yes Jon Billings, NP  sacubitril-valsartan (ENTRESTO) 24-26 MG TAKE ONE TABLET BY MOUTH TWICE DAILY  08/25/22  Yes Cashis Rill A, FNP  Semaglutide, 1 MG/DOSE, 4 MG/3ML SOPN Inject 1 mg as directed once a week. 07/24/22  Yes Jon Billings, NP  vitamin B-12 (CYANOCOBALAMIN) 500 MCG tablet Take 500 mcg by mouth daily.   Yes [provider]  albuterol (VENTOLIN HFA) 108 (90 Base) MCG/ACT inhaler Inhale 2 puffs into the lungs every 6 (six) hours as needed for wheezing or shortness of breath. Patient not taking: Reported on 07/25/2022 12/27/21   Charlynne Cousins, MD   Review of Systems  Constitutional:  Negative for appetite change and fatigue.  HENT:  Negative for congestion, postnasal drip and sore throat.   Eyes: Negative.   Respiratory:  Negative for cough,  chest tightness and shortness of breath.   Cardiovascular:  Negative for chest pain, palpitations and leg swelling.  Gastrointestinal:  Negative for abdominal distention and abdominal pain.  Endocrine: Negative.   Genitourinary: Negative.   Musculoskeletal:  Positive for arthralgias (left shoulder). Negative for back pain and neck pain.  Skin: Negative.   Allergic/Immunologic: Negative.   Neurological:  Negative for dizziness and light-headedness.  Hematological:  Negative for adenopathy. Does not bruise/bleed easily.  Psychiatric/Behavioral:  Negative for dysphoric mood and sleep disturbance (sleeping on 1 pillow). The patient is not nervous/anxious.    Vitals:   12/03/22 0853  BP: (!) 151/67  Pulse: (!) 58  Resp: 18  SpO2: 100%  Weight: 173 lb 8 oz (78.7 kg)   Wt Readings from Last 3 Encounters:  12/03/22 173 lb 8 oz (78.7 kg)  10/02/22 167 lb 11.2 oz (76.1 kg)  07/25/22 167 lb 6 oz (75.9 kg)   Lab Results  Component Value Date   CREATININE 1.36 (H) 10/02/2022   CREATININE 1.23 (H) 07/25/2022   CREATININE 1.45 (H) 07/03/2022   Physical Exam Vitals and nursing note reviewed.  Constitutional:      Appearance: Normal appearance.  HENT:     Head: Normocephalic and atraumatic.  Cardiovascular:     Rate and Rhythm: Regular rhythm. Bradycardia present.  Pulmonary:     Effort: Pulmonary effort is normal. No respiratory distress.     Breath sounds: No wheezing or rales.  Abdominal:     General: There is no distension.     Palpations: Abdomen is soft.     Tenderness: There is no abdominal tenderness.  Musculoskeletal:        General: No tenderness.     Cervical back: Normal range of motion and neck supple.     Right lower leg: No edema.     Left lower leg: No edema.  Skin:    General: Skin is warm and dry.  Neurological:     General: No focal deficit present.     Mental Status: She is alert and oriented to person, place, and time.  Psychiatric:        Mood and Affect:  Mood normal.        Behavior: Behavior normal.        Thought Content: Thought content normal.    Assessment & Plan:  1: Chronic heart failure with preserved ejection fraction with structural changes (LAE)- - NYHA class I - euvolemic - weighing daily; reminded to call for an overnight weight gain of > 2 pounds or a weekly weight gain of > 5 pounds - weight up 6 pounds from last visit here 4 months ago - does add salt to her eggs but says that she's trying to not add any salt to anything  else; trying to keep daily sodium intake to ~ 2082m / day - on GDMT of jardiance & entresto - will increase entresto to 49/567mBID: can finish her current bottle by taking 2 tabs BID until gone & then begin the 49/5166mose - will ask PCP to check BMP next month - BNP 01/04/22 was 537.6  2: HTN- - BP 151/67; increasing entresto per above - saw PCP ([place]) 10/02/22 - BMP 10/02/22 reviewed and showed sodium 140, potassium 4.2, creatinine 1.36 and GFR 43  3: DM- - A1c 10/02/22 was 6.6% - fasting glucose at home has been running in the 130's  4: Atrial fibrillation- - saw cardiology(Kowalski) 08/06/22 - carvedilol 12.5mg2mD   Medication bottles reviewed.   Return in 2 months, sooner if needed.

## 2022-12-03 ENCOUNTER — Ambulatory Visit: Payer: Medicare HMO | Attending: Family | Admitting: Family

## 2022-12-03 ENCOUNTER — Encounter: Payer: Self-pay | Admitting: Family

## 2022-12-03 VITALS — BP 151/67 | HR 58 | Resp 18 | Wt 173.5 lb

## 2022-12-03 DIAGNOSIS — I10 Essential (primary) hypertension: Secondary | ICD-10-CM | POA: Diagnosis not present

## 2022-12-03 DIAGNOSIS — I5032 Chronic diastolic (congestive) heart failure: Secondary | ICD-10-CM

## 2022-12-03 DIAGNOSIS — Z79899 Other long term (current) drug therapy: Secondary | ICD-10-CM | POA: Insufficient documentation

## 2022-12-03 DIAGNOSIS — I4891 Unspecified atrial fibrillation: Secondary | ICD-10-CM | POA: Insufficient documentation

## 2022-12-03 DIAGNOSIS — I110 Hypertensive heart disease with heart failure: Secondary | ICD-10-CM | POA: Insufficient documentation

## 2022-12-03 DIAGNOSIS — M25512 Pain in left shoulder: Secondary | ICD-10-CM | POA: Diagnosis not present

## 2022-12-03 DIAGNOSIS — E785 Hyperlipidemia, unspecified: Secondary | ICD-10-CM | POA: Insufficient documentation

## 2022-12-03 DIAGNOSIS — Z87891 Personal history of nicotine dependence: Secondary | ICD-10-CM | POA: Diagnosis not present

## 2022-12-03 DIAGNOSIS — I480 Paroxysmal atrial fibrillation: Secondary | ICD-10-CM

## 2022-12-03 DIAGNOSIS — F419 Anxiety disorder, unspecified: Secondary | ICD-10-CM | POA: Diagnosis not present

## 2022-12-03 DIAGNOSIS — R69 Illness, unspecified: Secondary | ICD-10-CM | POA: Diagnosis not present

## 2022-12-03 DIAGNOSIS — K219 Gastro-esophageal reflux disease without esophagitis: Secondary | ICD-10-CM | POA: Insufficient documentation

## 2022-12-03 DIAGNOSIS — Z7984 Long term (current) use of oral hypoglycemic drugs: Secondary | ICD-10-CM | POA: Diagnosis not present

## 2022-12-03 DIAGNOSIS — E119 Type 2 diabetes mellitus without complications: Secondary | ICD-10-CM | POA: Diagnosis not present

## 2022-12-03 MED ORDER — SACUBITRIL-VALSARTAN 49-51 MG PO TABS: 1.0000 | ORAL_TABLET | Freq: Two times a day (BID) | ORAL | 5 refills | Status: DC

## 2022-12-03 NOTE — Patient Instructions (Addendum)
Continue weighing daily and call for an overnight weight gain of 3 pounds or more or a weekly weight gain of more than 5 pounds.   If you have voicemail, please make sure your mailbox is cleaned out so that we may leave a message and please make sure to listen to any voicemails.    If you receive a satisfaction survey regarding the [facility], please take the time to fill it out. This way we can continue to provide excellent care and make any changes that need to be made.    Increase your entresto to 2 tablets in the morning and 2 tablets in the evening until your current bottle is empty. When you get the new bottle, you will resume taking 1 tablet twice daily as it will be the higher dose of 49/74m

## 2022-12-10 ENCOUNTER — Ambulatory Visit (INDEPENDENT_AMBULATORY_CARE_PROVIDER_SITE_OTHER): Payer: Medicare HMO

## 2022-12-10 VITALS — Ht 61.0 in | Wt 173.0 lb

## 2022-12-10 DIAGNOSIS — Z0000 Encounter for general adult medical examination without abnormal findings: Secondary | ICD-10-CM

## 2022-12-10 NOTE — Progress Notes (Signed)
I connected with  Brookie A Holohan on 12/10/22 by a audio enabled telemedicine application and verified that I am speaking with the correct person using two identifiers.  Patient Location: Home  Provider Location: Home Office  I discussed the limitations of evaluation and management by telemedicine. The patient expressed understanding and agreed to proceed.  Subjective:   Amanda [name] is a 67 y.o. female who presents for Medicare Annual (Subsequent) preventive examination.  Review of Systems     Cardiac Risk Factors include: advanced age (>85mn, >>36women);diabetes mellitus     Objective:    There were no vitals filed for this visit. There is no height or weight on file to calculate BMI.     12/10/2022   11:17 AM 01/09/2022   12:32 PM 01/05/2022    6:35 PM 01/04/2022    5:08 PM 01/04/2022    2:46 PM 12/06/2021   11:28 AM 11/02/2021    9:06 AM  Advanced Directives  Does Patient Have a Medical Advance Directive? No No  No No No No  Would patient like information on creating a medical advance directive? No - Patient declined  No - Patient declined   No - Patient declined No - Patient declined    Current Medications (verified) Outpatient Encounter Medications as of 12/10/2022  Medication Sig   albuterol (VENTOLIN HFA) 108 (90 Base) MCG/ACT inhaler Inhale 2 puffs into the lungs every 6 (six) hours as needed for wheezing or shortness of breath.   Calcium Carb-Cholecalciferol (CALCIUM 600+D3) 600-200 MG-UNIT TABS Take 1 tablet by mouth in the morning.   carvedilol (COREG) 12.5 MG tablet Take 1 tablet (12.5 mg total) by mouth 2 (two) times daily with a meal.   empagliflozin (JARDIANCE) 10 MG TABS tablet Take 1 tablet (10 mg total) by mouth daily before breakfast.   ferrous sulfate 325 (65 FE) MG tablet Take 1 tablet (325 mg total) by mouth daily.   furosemide (LASIX) 20 MG tablet Take 1 tablet (20 mg total) by mouth daily.   Multiple Vitamin (MULTIVITAMIN) tablet Take 1 tablet by mouth daily.    omeprazole (PRILOSEC) 20 MG capsule Take 20 mg by mouth daily.   potassium chloride SA (KLOR-CON M) 20 MEQ tablet TAKE ONE TABLET BY MOUTH EVERY MORNING   rosuvastatin (CRESTOR) 5 MG tablet Take 1 tablet (5 mg total) by mouth daily.   sacubitril-valsartan (ENTRESTO) 49-51 MG Take 1 tablet by mouth 2 (two) times daily.   Semaglutide, 1 MG/DOSE, 4 MG/3ML SOPN Inject 1 mg as directed once a week.   vitamin B-12 (CYANOCOBALAMIN) 500 MCG tablet Take 500 mcg by mouth daily.   No facility-administered encounter medications on file as of 12/10/2022.    Allergies (verified) Tramadol and Penicillins   History: Past Medical History:  Diagnosis Date   Anxiety    Arthritis    Atrial fibrillation (HCC)    CHF (congestive heart failure) ([facility]    Cholecystitis    Diabetes mellitus without complication ([facility]    Dyspnea    GERD (gastroesophageal reflux disease)    Hyperlipidemia    Hypertension    Past Surgical History:  Procedure Laterality Date   CARDIOVERSION N/A 02/13/2022   Procedure: CARDIOVERSION;  Surgeon: KCorey Skains MD;  Location: [facility] ORS;  Service: Cardiovascular;  Laterality: N/A;   COLONOSCOPY  2012   IR CHOLANGIOGRAM EXISTING TUBE  12/11/2021   IR PERC CHOLECYSTOSTOMY  11/02/2021   Family History  Problem Relation Age of Onset   COPD Mother  Hypertension Mother    Diabetes Mother    Severe combined immunodeficiency Father    Diabetes Sister    Heart disease Sister    Heart disease Sister    Cancer Niece    Social History   Socioeconomic History   Marital status: Single    Spouse name: Fritz Pickerel   Number of children: Not on file   Years of education: Not on file   Highest education level: Not on file  Occupational History   Not on file  Tobacco Use   Smoking status: Former    Types: Cigarettes    Quit date: 12/07/1975    Years since quitting: 47.0   Smokeless tobacco: Never  Vaping Use   Vaping Use: Never used  Substance and Sexual Activity   Alcohol  use: No   Drug use: No   Sexual activity: Not on file  Other Topics Concern   Not on file  Social History Narrative   Not on file   Social Determinants of Health   Financial Resource Strain: Low Risk  (12/10/2022)   Overall Financial Resource Strain (CARDIA)    Difficulty of Paying Living Expenses: Not hard at all  Food Insecurity: No Food Insecurity (12/10/2022)   Hunger Vital Sign    Worried About Running Out of Food in the Last Year: Never true    [place] in the Last Year: Never true  Transportation Needs: No Transportation Needs (12/10/2022)   PRAPARE - Transportation    Lack of Transportation (Medical): No    Lack of Transportation (Non-Medical): No  Physical Activity: Insufficiently Active (12/10/2022)   Exercise Vital Sign    Days of Exercise per Week: 3 days    Minutes of Exercise per Session: 30 min  Stress: No Stress Concern Present (12/10/2022)   [place]    Feeling of Stress : Not at all  Social Connections: Socially Isolated (12/10/2022)   Social Connection and Isolation Panel [NHANES]    Frequency of Communication with Friends and Family: More than three times a week    Frequency of Social Gatherings with Friends and Family: Twice a week    Attends Religious Services: Never    Marine scientist or Organizations: No    Attends Music therapist: Never    Marital Status: Never married    Tobacco Counseling Counseling given: Not Answered   Clinical Intake:  Pre-visit preparation completed: Yes  Pain : No/denies pain     Nutritional Risks: None Diabetes: Yes CBG done?: No Did pt. bring in CBG monitor from home?: No  How often do you need to have someone help you when you read instructions, pamphlets, or other written materials from your doctor or pharmacy?: 1 - Never  Diabetic?yes Nutrition Risk Assessment:  Has the patient had any N/V/D within the last 2  months?  No  Does the patient have any non-healing wounds?  No  Has the patient had any unintentional weight loss or weight gain?  No   Diabetes:  Is the patient diabetic?  Yes  If diabetic, was a CBG obtained today?  No  Did the patient bring in their glucometer from home?  No  How often do you monitor your CBG's? Once/day.   Financial Strains and Diabetes Management:  Are you having any financial strains with the device, your supplies or your medication? No .  Does the patient want to be seen by Chronic Care Management  for management of their diabetes?  No  Would the patient like to be referred to a Nutritionist or for Diabetic Management?  No   Diabetic Exams:  Diabetic Eye Exam: Completed 10/02/22. Marland Kitchen Pt has been advised about the importance in completing this exam.  Diabetic Foot Exam: Completed 12/25/20. Pt has been advised about the importance in completing this exam.   Interpreter Needed?: No  Information entered by :: Kirke Shaggy, LPN   Activities of Daily Living    12/10/2022   11:18 AM 07/25/2022    8:22 AM  In your present state of health, do you have any difficulty performing the following activities:  Hearing? 0 0  Vision? 0 0  Difficulty concentrating or making decisions? 0 0  Walking or climbing stairs? 1 0  Dressing or bathing? 0 0  Doing errands, shopping? 0 0  Preparing Food and eating ? N   Using the Toilet? N   In the past six months, have you accidently leaked urine? N   Do you have problems with loss of bowel control? N   Managing your Medications? N   Managing your Finances? N   Housekeeping or managing your Housekeeping? N     Patient Care Team: Jon Billings, NP as PCP - General (Nurse Practitioner) Lane Hacker, [facility] (Pharmacist)  Indicate any recent Medical Services you may have received from other than [facility] providers in the past year (date may be approximate).     Assessment:   This is a routine wellness examination for  Chaye.  Hearing/Vision screen Hearing Screening - Comments:: No aids Vision Screening - Comments:: Wears glasses- [facility]  Dietary issues and exercise activities discussed: Current Exercise Habits: Home exercise routine, Type of exercise: walking, Time (Minutes): 30, Frequency (Times/Week): 3, Weekly Exercise (Minutes/Week): 90, Intensity: Mild   Goals Addressed             This Visit's Progress    DIET - EAT MORE FRUITS AND VEGETABLES         Depression Screen    12/10/2022   11:15 AM 10/02/2022    9:40 AM 07/25/2022    8:22 AM 07/03/2022    2:25 PM 06/25/2022    8:59 AM 05/21/2022    1:36 PM 03/21/2022    9:05 AM  PHQ 2/9 Scores  PHQ - 2 Score 0 0 0 2 0 0 0  PHQ- 9 Score 0 2  3   1    $ Fall Risk    12/10/2022   11:17 AM 10/02/2022    9:39 AM 07/25/2022    8:21 AM 07/03/2022    2:25 PM 06/25/2022    8:58 AM  Fall Risk   Falls in the past year? 0 0 0 1 0  Number falls in past yr: 0 0 0 0 0  Injury with Fall? 0 0 0 0 0  Risk for fall due to : No Fall Risks No Fall Risks No Fall Risks History of fall(s) Impaired mobility  Follow up Falls prevention discussed;Falls evaluation completed Falls evaluation completed Falls evaluation completed;Falls prevention discussed Falls evaluation completed Falls evaluation completed;Education provided;Falls prevention discussed    FALL RISK PREVENTION PERTAINING TO THE HOME:  Any stairs in or around the home? Yes  If so, are there any without handrails? No  Home free of loose throw rugs in walkways, pet beds, electrical cords, etc? Yes  Adequate lighting in your home to reduce risk of falls? Yes   ASSISTIVE DEVICES UTILIZED TO  PREVENT FALLS:  Life alert? No  Use of a cane, walker or w/c? No  Grab bars in the bathroom? Yes  Shower chair or bench in shower? Yes  Elevated toilet seat or a handicapped toilet? No   Cognitive Function:        12/10/2022   11:22 AM  6CIT Screen  What Year? 0 points  What month? 0 points  What  time? 0 points  Count back from 20 0 points  Months in reverse 0 points  Repeat phrase 0 points  Total Score 0 points    Immunizations Immunization History  Administered Date(s) Administered   Fluad Quad(high Dose 65+) 09/03/2021, 07/30/2022   Influenza,inj,Quad PF,6+ Mos 09/16/2016, 07/16/2017, 07/22/2018, 08/18/2019, 08/01/2020   Moderna Sars-Covid-2 Vaccination 12/18/2019, 01/15/2020, 10/16/2020   PNEUMOCOCCAL CONJUGATE-20 10/02/2022   Pneumococcal Conjugate-13 12/25/2020   Pneumococcal Polysaccharide-23 10/26/2002   Tdap 03/16/2013    TDAP status: Up to date  Flu Vaccine status: Up to date  Pneumococcal vaccine status: Up to date  Covid-19 vaccine status: Completed vaccines  Qualifies for Shingles Vaccine? Yes   Zostavax completed No   Shingrix Completed?: No.    Education has been provided regarding the importance of this vaccine. Patient has been advised to call insurance company to determine out of pocket expense if they have not yet received this vaccine. Advised may also receive vaccine at local pharmacy or Health Dept. Verbalized acceptance and understanding.  Screening Tests Health Maintenance  Topic Date Due   MAMMOGRAM  Never done   Zoster Vaccines- Shingrix (1 of 2) Never done   COLONOSCOPY (Pts 45-87yr Insurance coverage will need to be confirmed)  03/03/2021   FOOT EXAM  12/25/2021   COVID-19 Vaccine (4 - 2023-24 season) 06/21/2022   DTaP/Tdap/Td (2 - Td or Tdap) 03/17/2023   HEMOGLOBIN A1C  04/03/2023   OPHTHALMOLOGY EXAM  10/02/2023   Diabetic kidney evaluation - eGFR measurement  10/03/2023   Diabetic kidney evaluation - Urine ACR  10/03/2023   Medicare Annual Wellness (AWV)  12/11/2023   Pneumonia Vaccine 67 Years old  Completed   INFLUENZA VACCINE  Completed   DEXA SCAN  Completed   Hepatitis C Screening  Completed   HPV VACCINES  Aged Out    Health Maintenance  Health Maintenance Due  Topic Date Due   MAMMOGRAM  Never done   Zoster  Vaccines- Shingrix (1 of 2) Never done   COLONOSCOPY (Pts 45-422yrInsurance coverage will need to be confirmed)  03/03/2021   FOOT EXAM  12/25/2021   COVID-19 Vaccine (4 - 2023-24 season) 06/21/2022    Colorectal cancer screening: Type of screening: Colonoscopy. Completed 03/04/11. Repeat every 10 years- declined referral  Declined referral for mammogram and BDS   Lung Cancer Screening: (Low Dose CT Chest recommended if Age 68-80ears, 30 pack-year currently smoking OR have quit w/in 15years.) does not qualify.   Additional Screening:  Hepatitis C Screening: does qualify; Completed 01/07/17  Vision Screening: Recommended annual ophthalmology exams for early detection of glaucoma and other disorders of the eye. Is the patient up to date with their annual eye exam?  Yes  Who is the provider or what is the name of the office in which the patient attends annual eye exams? [facility] If pt is not established with a provider, would they like to be referred to a provider to establish care? No .   Dental Screening: Recommended annual dental exams for proper oral hygiene  Community Resource Referral / Chronic  Care Management: CRR required this visit?  No   CCM required this visit?  No      Plan:     I have personally reviewed and noted the following in the patient's chart:   Medical and social history Use of alcohol, tobacco or illicit drugs  Current medications and supplements including opioid prescriptions. Patient is not currently taking opioid prescriptions. Functional ability and status Nutritional status Physical activity Advanced directives List of other physicians Hospitalizations, surgeries, and ER visits in previous 12 months Vitals Screenings to include cognitive, depression, and falls Referrals and appointments  In addition, I have reviewed and discussed with patient certain preventive protocols, quality metrics, and best practice recommendations. A written  personalized care plan for preventive services as well as general preventive health recommendations were provided to patient.     Dionisio David, LPN   [id number]   Nurse Notes: none

## 2022-12-10 NOTE — Patient Instructions (Signed)
Ms. Evangelist , Thank you for taking time to come for your Medicare Wellness Visit. I appreciate your ongoing commitment to your health goals. Please review the following plan we discussed and let me know if I can assist you in the future.   These are the goals we discussed:  Goals       DIET - EAT MORE FRUITS AND VEGETABLES      DIET - INCREASE WATER INTAKE      Monitor and Manage My Blood Sugar-Diabetes Type 2      Timeframe:  Long-Range Goal Priority:  High Start Date:                             Expected End Date:                       Follow Up Date 3 month follow up visit    - check blood sugar at prescribed times - check blood sugar if I feel it is too high or too low - take the blood sugar meter to all doctor visits    Why is this important?   Checking your blood sugar at home helps to keep it from getting very high or very low.  Writing the results in a diary or log helps the doctor know how to care for you.  Your blood sugar log should have the time, date and the results.  Also, write down the amount of insulin or other medicine that you take.  Other information, like what you ate, exercise done and how you were feeling, will also be helpful.     Notes:       Obtain Eye Exam-Diabetes Type 2      Follow Up Date 3 month follow up    - schedule appointment with eye doctor    Why is this important?   Eye check-ups are important when you have diabetes.  Vision loss can be prevented.    Notes:       Pharmacy care plan      CARE PLAN ENTRY (see longitudinal plan of care for additional care plan information)  Current Barriers:  Chronic Disease Management support, education, and care coordination needs related to Hypertension, Hyperlipidemia, Diabetes, GERD, and Chronic Kidney Disease   Hypertension BP Readings from Last 3 Encounters:  09/05/20 120/76  08/01/20 110/73  06/02/20 127/73  Pharmacist Clinical Goal(s): Over the next 90 days, patient will work with PharmD  and providers to maintain BP goal <130/80 Current regimen:  Benazepril 40 mg qd Carvedilol 25 mg bid hctz 25 mg qd Interventions: Provided diet and exercise counseling. Reviewed proper BP technique including sitting down for at least 5 minutes before, resting calmly, with your feet flat on the floor.   Patient self care activities - Over the next 90 days, patient will: Check BP 1-2 times weekly, document, and provide at future appointments Ensure daily salt intake < 2300 mg/day  Hyperlipidemia Lab Results  Component Value Date/Time   [facility] 69 09/05/2020 04:15 PM  Pharmacist Clinical Goal(s): Over the next 90 days, patient will work with PharmD and providers to achieve LDL goal < 70 Current regimen:  Lovastatin 45m x2  daily  Interventions: Provided diet and exercise counseling.  Patient self care activities - Over the next 90 days, patient will: Focus on diet and increasing exercise to 30 min/day 5 days/week.  Diabetes Lab Results  Component Value Date/Time  HGBA1C 6.8 09/05/2020 04:11 PM   HGBA1C 8.2 (H) 06/02/2020 03:45 PM   HGBA1C 7.8 06/12/2016 12:00 AM  Pharmacist Clinical Goal(s): Over the next 90 days, patient will work with PharmD and providers to achieve A1c goal <7% Current regimen:  Trulicity 3 mg q week  Synjardy XR 12.58m/1000mg  bid Interventions: Provided diet and exercise counseling. Reviewed fill dates in computer and dosing instructions with patient. Reviewed goal glucose readings for an A1c of <7%, we want to see fasting sugars <130 and 2 hour after meal sugars <180.  Patient self care activities - Over the next 90 days, patient will: Check blood sugar twice daily, document, and provide at future appointments Contact provider with any episodes of hypoglycemia   Medication management Pharmacist Clinical Goal(s): Over the next 90 days, patient will work with PharmD and providers to achieve optimal medication adherence Current pharmacy:  [facility]  Interventions Comprehensive medication review performed. Continue current medication management strategy Patient self care activities - Over the next 90 days, patient will: Focus on medication adherence by fill dates Take medications as prescribed Report any questions or concerns to PharmD and/or provider(s)  Please see past updates related to this goal by clicking on the "Past Updates" button in the selected goal        PharmD "I can't afford this medication" (pt-stated)      CARE PLAN ENTRY (see longtitudinal plan of care for additional care plan information)  Current Barriers:  Diabetes: controlled, complicated by chronic medical conditions including CKD, HLD, obesity, most recent A1c 6.4%  S/p d/c of glimepiride by PCP at last appointment Most recent eGFR: ~52 mL/min Current antihyperglycemic regimen: Synjardy XR [id number] BID, Trulicity 1.5 mg weekly Current blood glucose readings:  Fastings: generally 120-130s, though has been eating supper late the past few days, so fastings have been 150-160s Denies any episodes of hypoglycemia  Current meal patterns:  Lunch: eating more vegetables for lunch (broccoli, cauliflower, and carrots) rather than pretzels/popcorn; fresh fruits Drinks: Water, unsweet tea, sometimes coffee  Has cut down sizes of diet sodas (12 oz instead of 20 oz) Cardiovascular risk reduction: Current hypertensive regimen: carvedilol 25 mg BID, HCTZ 25 mg daily, benazepril 40 mg daily; last clinic BP at goal <130/80 Current hyperlipidemia regimen: lovastatin 40 mg (2 20 mg tab daily); last LDL NOT at goal <70 Current antiplatelet regimen: ASA 81 mg daily  Pharmacist Clinical Goal(s):  Over the next 90 days, patient will work with PharmD and primary care provider to address optimized medication management  Interventions: Comprehensive medication review performed, medication list updated in electronic medical record Inter-disciplinary care team  collaboration (see longitudinal plan of care) Reviewed goal A1c, goal fasting, and goal 2 hour post prandial glucose readings.  Encouraged to continue to check fastings, and add occasional 2 hour post prandial glucose readings.  Discussed to contact [place] <130 consistently, and can consider increasing Trulicity to 3 mg weekly.  Evaluate LDL at next visit and consider changing lovastatin 2 tab daily to 1 tab of high intensity statin to target LDL <70  Patient Self Care Activities:  Patient will check blood glucose daily, document, and provide at future appointments Patient will take medications as prescribed Patient will report any questions or concerns to provider   Please see past updates related to this goal by clicking on the "Past Updates" button in the selected goal          This is a list of the screening recommended for  you and due dates:  Health Maintenance  Topic Date Due   Mammogram  Never done   Zoster (Shingles) Vaccine (1 of 2) Never done   Colon Cancer Screening  03/03/2021   Complete foot exam   12/25/2021   COVID-19 Vaccine (4 - 2023-24 season) 06/21/2022   DTaP/Tdap/Td vaccine (2 - Td or Tdap) 03/17/2023   Hemoglobin A1C  04/03/2023   Eye exam for diabetics  10/02/2023   Yearly kidney function blood test for diabetes  10/03/2023   Yearly kidney health urinalysis for diabetes  10/03/2023   Medicare Annual Wellness Visit  12/11/2023   Pneumonia Vaccine  Completed   Flu Shot  Completed   DEXA scan (bone density measurement)  Completed   Hepatitis C Screening: USPSTF Recommendation to screen - Ages 53-79 yo.  Completed   HPV Vaccine  Aged Out    Advanced directives: no  Conditions/risks identified: none  Next appointment: Follow up in one year for your annual wellness visit 12/16/23 @ 10:15 am by phone   Preventive Care 65 Years and Older, Female Preventive care refers to lifestyle choices and visits with your health care provider that can promote  health and wellness. What does preventive care include? A yearly physical exam. This is also called an annual well check. Dental exams once or twice a year. Routine eye exams. Ask your health care provider how often you should have your eyes checked. Personal lifestyle choices, including: Daily care of your teeth and gums. Regular physical activity. Eating a healthy diet. Avoiding tobacco and drug use. Limiting alcohol use. Practicing safe sex. Taking low-dose aspirin every day. Taking vitamin and mineral supplements as recommended by your health care provider. What happens during an annual well check? The services and screenings done by your health care provider during your annual well check will depend on your age, overall health, lifestyle risk factors, and family history of disease. Counseling  Your health care provider may ask you questions about your: Alcohol use. Tobacco use. Drug use. Emotional well-being. Home and relationship well-being. Sexual activity. Eating habits. History of falls. Memory and ability to understand (cognition). Work and work Statistician. Reproductive health. Screening  You may have the following tests or measurements: Height, weight, and BMI. Blood pressure. Lipid and cholesterol levels. These may be checked every 5 years, or more frequently if you are over 40 years old. Skin check. Lung cancer screening. You may have this screening every year starting at age 45 if you have a 30-pack-year history of smoking and currently smoke or have quit within the past 15 years. Fecal occult blood test (FOBT) of the stool. You may have this test every year starting at age 53. Flexible sigmoidoscopy or colonoscopy. You may have a sigmoidoscopy every 5 years or a colonoscopy every 10 years starting at age 36. Hepatitis C blood test. Hepatitis B blood test. Sexually transmitted disease (STD) testing. Diabetes screening. This is done by checking your blood sugar  (glucose) after you have not eaten for a while (fasting). You may have this done every 1-3 years. Bone density scan. This is done to screen for osteoporosis. You may have this done starting at age 27. Mammogram. This may be done every 1-2 years. Talk to your health care provider about how often you should have regular mammograms. Talk with your health care provider about your test results, treatment options, and if necessary, the need for more tests. Vaccines  Your health care provider may recommend certain vaccines, such as: Influenza  vaccine. This is recommended every year. Tetanus, diphtheria, and acellular pertussis (Tdap, Td) vaccine. You may need a Td booster every 10 years. Zoster vaccine. You may need this after age 92. Pneumococcal 13-valent conjugate (PCV13) vaccine. One dose is recommended after age 34. Pneumococcal polysaccharide (PPSV23) vaccine. One dose is recommended after age 75. Talk to your health care provider about which screenings and vaccines you need and how often you need them. This information is not intended to replace advice given to you by your health care provider. Make sure you discuss any questions you have with your health care provider. Document Released: 11/03/2015 Document Revised: 06/26/2016 Document Reviewed: 08/08/2015 Elsevier Interactive Patient Education  2017 [place] Prevention in the Home Falls can cause injuries. They can happen to people of all ages. There are many things you can do to make your home safe and to help prevent falls. What can I do on the outside of my home? Regularly fix the edges of walkways and driveways and fix any cracks. Remove anything that might make you trip as you walk through a door, such as a raised step or threshold. Trim any bushes or trees on the path to your home. Use bright outdoor lighting. Clear any walking paths of anything that might make someone trip, such as rocks or tools. Regularly check to see  if handrails are loose or broken. Make sure that both sides of any steps have handrails. Any raised decks and porches should have guardrails on the edges. Have any leaves, snow, or ice cleared regularly. Use sand or salt on walking paths during winter. Clean up any spills in your garage right away. This includes oil or grease spills. What can I do in the bathroom? Use night lights. Install grab bars by the toilet and in the tub and shower. Do not use towel bars as grab bars. Use non-skid mats or decals in the tub or shower. If you need to sit down in the shower, use a plastic, non-slip stool. Keep the floor dry. Clean up any water that spills on the floor as soon as it happens. Remove soap buildup in the tub or shower regularly. Attach bath mats securely with double-sided non-slip rug tape. Do not have throw rugs and other things on the floor that can make you trip. What can I do in the bedroom? Use night lights. Make sure that you have a light by your bed that is easy to reach. Do not use any sheets or blankets that are too big for your bed. They should not hang down onto the floor. Have a firm chair that has side arms. You can use this for support while you get dressed. Do not have throw rugs and other things on the floor that can make you trip. What can I do in the kitchen? Clean up any spills right away. Avoid walking on wet floors. Keep items that you use a lot in easy-to-reach places. If you need to reach something above you, use a strong step stool that has a grab bar. Keep electrical cords out of the way. Do not use floor polish or wax that makes floors slippery. If you must use wax, use non-skid floor wax. Do not have throw rugs and other things on the floor that can make you trip. What can I do with my stairs? Do not leave any items on the stairs. Make sure that there are handrails on both sides of the stairs and use them. Fix handrails  that are broken or loose. Make sure that  handrails are as long as the stairways. Check any carpeting to make sure that it is firmly attached to the stairs. Fix any carpet that is loose or worn. Avoid having throw rugs at the top or bottom of the stairs. If you do have throw rugs, attach them to the floor with carpet tape. Make sure that you have a light switch at the top of the stairs and the bottom of the stairs. If you do not have them, ask someone to add them for you. What else can I do to help prevent falls? Wear shoes that: Do not have high heels. Have rubber bottoms. Are comfortable and fit you well. Are closed at the toe. Do not wear sandals. If you use a stepladder: Make sure that it is fully opened. Do not climb a closed stepladder. Make sure that both sides of the stepladder are locked into place. Ask someone to hold it for you, if possible. Clearly mark and make sure that you can see: Any grab bars or handrails. First and last steps. Where the edge of each step is. Use tools that help you move around (mobility aids) if they are needed. These include: Canes. Walkers. Scooters. Crutches. Turn on the lights when you go into a dark area. Replace any light bulbs as soon as they burn out. Set up your furniture so you have a clear path. Avoid moving your furniture around. If any of your floors are uneven, fix them. If there are any pets around you, be aware of where they are. Review your medicines with your doctor. Some medicines can make you feel dizzy. This can increase your chance of falling. Ask your doctor what other things that you can do to help prevent falls. This information is not intended to replace advice given to you by your health care provider. Make sure you discuss any questions you have with your health care provider. Document Released: 08/03/2009 Document Revised: 03/14/2016 Document Reviewed: 11/11/2014 Elsevier Interactive Patient Education  2017 [employer].

## 2022-12-23 ENCOUNTER — Ambulatory Visit: Payer: Medicare HMO

## 2022-12-23 NOTE — Patient Outreach (Signed)
Care Management & Coordination Services Pharmacy Note  12/23/2022 Name:  Amanda [name] MRN:  [id number] DOB:  August 09, 1956  Summary: -Very pleasant 67 year old female presents for f/u CCM visit. 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 Has a big family (Over 12 members) and is one of 8 siblings. Doesn't have children of her own  -Still working part-time  Recommendations/Changes made from today's visit: -Recommend high intensity statin. Will cosign PCP. ASCVD risk 28.5% -Patient can't afford meds. Will start LIS ASAP  Subjective: Amanda [name] is an 67 y.o. year old female who is a primary patient of Jon Billings, NP.  The care coordination team was consulted for assistance with disease management and care coordination needs.    Engaged with patient by telephone for follow up visit.    Objective:  Lab Results  Component Value Date   CREATININE 1.36 (H) 10/02/2022   BUN 20 10/02/2022   EGFR 43 (L) 10/02/2022   GFRNONAA 48 (L) 07/25/2022   GFRAA 59 (L) 09/05/2020   NA 140 10/02/2022   K 4.2 10/02/2022   CALCIUM 9.2 10/02/2022   CO2 24 10/02/2022   GLUCOSE 102 (H) 10/02/2022    Lab Results  Component Value Date/Time   HGBA1C 6.6 (H) 10/02/2022 10:01 AM   HGBA1C 6.6 (H) 07/03/2022 02:58 PM   HGBA1C 6.9 (H) 01/22/2022 03:20 PM   HGBA1C 7.0 (H) 11/22/2021 03:42 PM   HGBA1C 7.8 06/12/2016 12:00 AM   MICROALBUR 150 (H) 10/02/2022 09:59 AM   MICROALBUR 30 (H) 12/25/2020 03:03 PM    Last diabetic Eye exam: No results found for: "HMDIABEYEEXA"  Last diabetic Foot exam: No results found for: "HMDIABFOOTEX"   Lab Results  Component Value Date   CHOL 219 (H) 10/02/2022   HDL 60 10/02/2022   LDLCALC 131 (H) 10/02/2022   TRIG 156 (H) 10/02/2022   CHOLHDL 3.7 10/02/2022       Latest Ref Rng & Units 10/02/2022   10:01 AM 07/03/2022    2:58 PM 01/22/2022    3:22 PM  Hepatic Function  Total Protein 6.0 - 8.5 g/dL 6.7  6.9  7.1   Albumin 3.9 - 4.9 g/dL 4.1  4.0  3.9   AST 0 - 40  IU/L '23  22  19   '$ ALT 0 - 32 IU/L '13  16  10   '$ Alk Phosphatase 44 - 121 IU/L 77  88  111   Total Bilirubin 0.0 - 1.2 mg/dL 0.4  0.3  0.3     Lab Results  Component Value Date/Time   TSH 3.901 01/04/2022 06:31 PM   TSH 1.640 08/30/2021 08:22 AM   TSH 2.130 09/05/2020 04:15 PM       Latest Ref Rng & Units 10/02/2022   10:01 AM 07/03/2022    2:58 PM 03/21/2022    9:23 AM  CBC  WBC 3.4 - 10.8 x10E3/uL 6.2  5.7  5.6   Hemoglobin 11.1 - 15.9 g/dL 12.1  11.8  11.7   Hematocrit 34.0 - 46.6 % 35.6  36.0  35.3   Platelets 150 - 450 x10E3/uL 186  193  206     Lab Results  Component Value Date/Time   VITAMINB12 >2000 (H) 10/02/2022 10:01 AM   VITAMINB12 >2000 (H) 07/03/2022 02:58 PM    Clinical ASCVD: Yes  The 10-year ASCVD risk score (Arnett DK, et al., 2019) is: 28.5%   Values used to calculate the score:     Age: 67 years  Sex: Female     Is Non-Hispanic African American: No     Diabetic: Yes     Tobacco smoker: No     Systolic Blood Pressure: 123XX123 mmHg     Is BP treated: Yes     HDL Cholesterol: 60 mg/dL     Total Cholesterol: 219 mg/dL    Other: (CHADS2VASc if Afib, MMRC or CAT for COPD, ACT, DEXA)     12/10/2022   11:15 AM 10/02/2022    9:40 AM 07/25/2022    8:22 AM  Depression screen PHQ 2/9  Decreased Interest 0 0 0  Down, Depressed, Hopeless 0 0 0  PHQ - 2 Score 0 0 0  Altered sleeping 0 1   Tired, decreased energy 0 1   Change in appetite 0 0   Feeling bad or failure about yourself  0 0   Trouble concentrating 0 0   Moving slowly or fidgety/restless 0 0   Suicidal thoughts 0 0   PHQ-9 Score 0 2   Difficult doing work/chores Not difficult at all Not difficult at all      Social History   Tobacco Use  Smoking Status Former   Types: Cigarettes   Quit date: 12/07/1975   Years since quitting: 47.0  Smokeless Tobacco Never   BP Readings from Last 3 Encounters:  12/03/22 (!) 151/67  10/02/22 132/63  07/25/22 (!) 156/54   Pulse Readings from Last 3  Encounters:  12/03/22 (!) 58  10/02/22 67  07/25/22 64   Wt Readings from Last 3 Encounters:  12/10/22 173 lb (78.5 kg)  12/03/22 173 lb 8 oz (78.7 kg)  10/02/22 167 lb 11.2 oz (76.1 kg)   BMI Readings from Last 3 Encounters:  12/10/22 32.69 kg/m  12/03/22 32.78 kg/m  10/02/22 31.69 kg/m    Allergies  Allergen Reactions   Tramadol Other (See Comments)    "woozy"   Penicillins Other (See Comments)    Dizziness Tolerated augmentin but does not like taste    Medications Reviewed Today     Reviewed by Dionisio David, LPN (Licensed Practical Nurse) on 12/10/22 at 1126  Med List Status: <None>   Medication Order Taking? Sig Documenting Provider Last Dose Status Informant  albuterol (VENTOLIN HFA) 108 (90 Base) MCG/ACT inhaler [id number] Yes Inhale 2 puffs into the lungs every 6 (six) hours as needed for wheezing or shortness of breath. Vigg, [name], MD Taking Active Self  Calcium Carb-Cholecalciferol (CALCIUM 600+D3) 600-200 MG-UNIT TABS [id number] Yes Take 1 tablet by mouth in the morning. [provider] Taking Active Self  carvedilol (COREG) 12.5 MG tablet [id number] Yes Take 1 tablet (12.5 mg total) by mouth 2 (two) times daily with a meal. Cannady, [name], NP Taking Active   empagliflozin (JARDIANCE) 10 MG TABS tablet [id number] Yes Take 1 tablet (10 mg total) by mouth daily before breakfast. Marnee Guarneri T, NP Taking Active   ferrous sulfate 325 (65 FE) MG tablet [id number] Yes Take 1 tablet (325 mg total) by mouth daily. Bonnielee Haff, MD Taking Active Self  furosemide (LASIX) 20 MG tablet [id number] Yes Take 1 tablet (20 mg total) by mouth daily. Jon Billings, NP Taking Active   Multiple Vitamin (MULTIVITAMIN) tablet [id number] Yes Take 1 tablet by mouth daily. [provider] Taking Active Self  omeprazole (PRILOSEC) 20 MG capsule [id number] Yes Take 20 mg by mouth daily. [provider] Taking Active Self  potassium chloride SA (KLOR-CON  M) 20 MEQ tablet [id number] Yes TAKE ONE  TABLET BY MOUTH EVERY MORNING [place]one, Aura Fey, FNP Taking Active   rosuvastatin (CRESTOR) 5 MG tablet [id number] Yes Take 1 tablet (5 mg total) by mouth daily. Jon Billings, NP Taking Active   sacubitril-valsartan (ENTRESTO) 49-51 MG [id number] Yes Take 1 tablet by mouth 2 (two) times daily. Alisa Graff, FNP Taking Active   Semaglutide, 1 MG/DOSE, 4 MG/3ML SOPN [id number] Yes Inject 1 mg as directed once a week. Jon Billings, NP Taking Active   vitamin B-12 (CYANOCOBALAMIN) 500 MCG tablet [id number] Yes Take 500 mcg by mouth daily. [provider] Taking Active Self            SDOH:  (Social Determinants of Health) assessments and interventions performed: Yes SDOH Interventions    Flowsheet Row Care Coordination from 12/23/2022 in [facility] from 12/10/2022 in [facility] Office Visit from 10/02/2022 in [place] Office Visit from 07/03/2022 in [place] Management from 06/17/2022 in [place] Management from 04/15/2022 in [place] Interventions        Food Insecurity Interventions -- Intervention Not Indicated -- -- -- Intervention Not Indicated  Housing Interventions -- Intervention Not Indicated -- -- -- --  Transportation Interventions -- Intervention Not Indicated, Patient Resources Tax adviser) -- -- -- Intervention Not Indicated  Utilities Interventions -- Intervention Not Indicated -- -- -- --  Alcohol Usage Interventions -- Intervention Not Indicated (Score <7) -- -- -- --  Depression Interventions/Treatment  -- -- PHQ2-9 Score <4 Follow-up Not Indicated PHQ2-9 Score <4 Follow-up Not Indicated -- --  Financial Strain Interventions Other (Comment)  [LIS/PAP] Intervention Not Indicated -- -- Other (Comment) --  Physical Activity  Interventions -- Intervention Not Indicated -- -- -- --  Stress Interventions -- Intervention Not Indicated -- -- -- --  Social Connections Interventions -- Intervention Not Indicated -- -- -- --       Medication Assistance:  LIS: -Application started March 2024  Ozempic: -2024: Denied, must do LIS first  Name and location of Current pharmacy:  [facility] - [place], [place] - [address] [place] [address] Phone: [phone] Fax: [phone]   Assessment/Plan   Diabetes (A1c goal <7%) Lab Results  Component Value Date   HGBA1C 6.6 (H) 10/02/2022   HGBA1C 6.6 (H) 07/03/2022   HGBA1C 6.9 (H) 01/22/2022   Lab Results  Component Value Date   MICROALBUR 150 (H) 10/02/2022   LDLCALC 131 (H) 10/02/2022   CREATININE 1.36 (H) 10/02/2022    Lab Results  Component Value Date   NA 140 10/02/2022   K 4.2 10/02/2022   CREATININE 1.36 (H) 10/02/2022   EGFR 43 (L) 10/02/2022   GFRNONAA 48 (L) 07/25/2022   GLUCOSE 102 (H) 10/02/2022    Lab Results  Component Value Date   WBC 6.2 10/02/2022   HGB 12.1 10/02/2022   HCT 35.6 10/02/2022   MCV 93 10/02/2022   PLT 186 10/02/2022    Lab Results  Component Value Date   LABMICR See below: 01/22/2022   LABMICR See below: 07/22/2018   MICROALBUR 150 (H) 10/02/2022   MICROALBUR 30 (H) 12/25/2020  -Well Controlled -s/p cholecystectomy 12/2021 -Current medications: Ozempic '1mg'$ /week  Appropriate, Effective, Safe, Query accessible Jardiance 10 mg once daily (PAP 2023) Appropriate, Effective, Safe, Query accessible -Medications previously tried: glimepiride, januvia, onglyza. Most recently stopped synjardy -  reports being stopped at hospital stay  -Current home glucose readings fasting glucose: did not test today, normally. 191, 218, 176, 154. June 2023: 129-149 August 2023: Didn't have readings March 2024: 12/23/22: 125 Normal: 125-140 -Denies  hypoglycemic/hyperglycemic symptoms -Current meal patterns: watching carbs, not adding salt. Just had gallbladder removed -Current exercise: no formal exercise  June 2023. 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 -Educated on A1c and blood sugar goals; Benefits of routine self-monitoring of blood sugar; -Counseled to check feet daily and get yearly eye exams -Counseled on diet and exercise extensively August 2023: Jardiance PAP submitted but patient hasn't heard back. Gave her number to call company and told her to call me either way to let me know. Will do Trulicity PAP as well. Coordinated with front desk, no samples available March 2024: PAP denied for Jardiance, must apply for LIS. Will have team do this ASAP   Hyperlipidemia: (LDL goal < 70) The 10-year ASCVD risk score (Arnett DK, et al., 2019) is: 28.5%   Values used to calculate the score:     Age: 71 years     Sex: Female     Is Non-Hispanic African American: No     Diabetic: Yes     Tobacco smoker: No     Systolic Blood Pressure: 123XX123 mmHg     Is BP treated: Yes     HDL Cholesterol: 60 mg/dL     Total Cholesterol: 219 mg/dL Lab Results  Component Value Date   CHOL 219 (H) 10/02/2022   CHOL 142 08/30/2021   CHOL 148 04/11/2021   Lab Results  Component Value Date   HDL 60 10/02/2022   HDL 50 08/30/2021   HDL 54 04/11/2021   Lab Results  Component Value Date   LDLCALC 131 (H) 10/02/2022   LDLCALC 64 08/30/2021   LDLCALC 70 04/11/2021   Lab Results  Component Value Date   TRIG 156 (H) 10/02/2022   TRIG 163 (H) 08/30/2021   TRIG 139 04/11/2021   Lab Results  Component Value Date   CHOLHDL 3.7 10/02/2022   CHOLHDL 2.8 08/30/2021   CHOLHDL 2.7 04/11/2021   No results found for: "LDLDIRECT" Last vitamin D No results found for: "25OHVITD2", "25OHVITD3", "VD25OH" Lab Results  Component Value Date   TSH 3.901 01/04/2022   -Controlled -HTN, DM. ASCVD 10 yr risk - intermediate. Sister  had MI per family Hx.  -Mild TG elevations.  -LDL controlled in 60s 08/2021 -Current treatment: Rosuvastatin '5mg'$  Query Appropriate,  -Medications previously tried:had been on 80 mg daily of lovastatin.  -Current dietary patterns: see dm -Current exercise habits: see dm -Educated on Cholesterol goals;  Benefits of statin for ASCVD risk reduction; August 2023: Recommend Statin March 2024: Recommend high intensity statin   Afib and Heart Failure (Goal: manage symptoms and prevent exacerbations) -Not ideally controlled -Last ejection fraction:  01/05/22: EF of 50-55% 50-55% (Date: 10/2021). Last BNP 538 01/04/22 in hosp.  -HF type: Diastolic -NYHA Class: I -AHA HF Stage: B (Heart disease present - no symptoms present) -Current treatment: Entresto 49/51 (Cardio completing PAP 2023) Appropriate, Effective, Safe, Query-Accessible Carvedilol 12.5 mg BID Appropriate, Effective, Safe, Accessible Furosemide 20 mg daily  Appropriate, Effective, Safe, Accessible Empagliflozin '10mg'$  Appropriate, Effective, Safe, Query accessible 2024: PAP denied. Will try LIS March 2024 -Medications previously tried: previously on ramipril, also stopped 10/2021 w/ AKI. Was previously on cardizem. Amiodarone. Eliquis (Cost) -Current home BP/HR readings:  March 2024: 12/23/22: 101/55 No  side effects per patient -Current dietary habits: low carb low salt -Current exercise habits: n/a -Educated on Importance of weighing daily; if you gain more than 2 pounds in one day or 5 pounds in one week, contact provider or utilize action plan per cardiology -Counseled on diet and exercise extensively August 2023: Eliquis PAP denied but patient doesn't remember why. Form stated she forgot something (Was denied last week per patient). Recommended she find form and gave her number to call me ASAP so we can re-submit with missing data. Cardio working on PAP [employer] for [employer] March 2024: Cardio was doing Pap for [employer] but I  see [employer] sent script to [facility] which is Tier 3 and normally around $45. Sent direct msg to [employer] inquiring about PAP status -Update: Darylene Price states "I really don't know as the person who normally did our patient assistance is no longer here. If you can take it over, I'd appreciate it "  -Will do LIS ASAP  CPP F/U November 2024  [place] Constable, [place].D. - [phone]

## 2022-12-23 NOTE — Patient Outreach (Signed)
  Care Management   Follow Up Note   12/23/2022 Name: Amanda [name] MRN: [id number] DOB: June 18, 1956   Referred by: Jon Billings, NP Reason for referral : Care Coordination   An unsuccessful telephone outreach was attempted today. The patient was referred to the case management team for assistance with care management and care coordination.   Follow Up Plan: The patient has been provided with contact information for the care management team and has been advised to call with any health related questions or concerns.  -Called all 3 numbers, unable to get in touch with patient  [place] Constable, Pharm.D. - [phone]

## 2022-12-24 ENCOUNTER — Telehealth: Payer: Self-pay

## 2022-12-24 NOTE — Progress Notes (Cosign Needed Addendum)
Patient applied for Patient Assistance for Jardiance through [facility]. Patient application was denied and she was referred to [place]. Made a call to the patient to help assist her with filling out online application. LVM for patient to return call.  Amanda [name]

## 2022-12-31 NOTE — Progress Notes (Unsigned)
There were no vitals taken for this visit.   Subjective:    Patient ID: Amanda [name], female    DOB: 1955/12/15, 67 y.o.   MRN: [id number]  HPI: Amanda [name] is a 67 y.o. female  No chief complaint on file.  Amanda / HYPERLIPIDEMIA/HF Satisfied with current treatment? yes Duration of Amanda: chronic BP monitoring frequency: a few times a week BP range: 120/60 BP medication side effects: no Past BP meds: carvedilol Duration of hyperlipidemia: chronic Cholesterol medication side effects: no Cholesterol supplements: none Past cholesterol medications: none Medication compliance: Compliant Aspirin: no Recent stressors: no Recurrent headaches: no Visual changes: no Palpitations: no Dyspnea: no Chest pain: no Lower extremity edema: no Dizzy/lightheaded: no Dr. Nehemiah [name] took her off the Eliquis and Amiodarone.  DIABETES Hypoglycemic episodes:no Polydipsia/polyuria: yes Visual disturbance: no Chest pain: no Paresthesias: no Glucose Monitoring: no  Accucheck frequency: not checking  Fasting glucose:  Post prandial:  Evening:  Before meals: Taking Insulin?: no  Long acting insulin:  Short acting insulin: Blood Pressure Monitoring: a few times a week Retinal Examination: Up to date; due NOV 2023 Foot Exam: Up to Date;  Diabetic Education: Completed Pneumovax:  not yet Influenza: Not up to Date Aspirin: no  CHRONIC KIDNEY DISEASE CKD status: stable Medications renally dose: yes Previous renal evaluation: yes; inpatient March 2023 Pneumovax:  Not up to Date Influenza Vaccine:  Not up to Date  ANEMIA Anemia status: stable Etiology of anemia: Duration of anemia treatment:  Compliance with treatment: excellent compliance Iron supplementation side effects: no Severity of anemia: mild Fatigue: yes Decreased exercise tolerance: no  Dyspnea on exertion: no Palpitations: no Bleeding: no Pica: no   Relevant past medical, surgical, family and social  history reviewed and updated as indicated. Interim medical history since our last visit reviewed. Allergies and medications reviewed and updated.  Review of Systems  Constitutional:  Negative for activity change, appetite change, chills, diaphoresis, fatigue, fever and unexpected weight change.  HENT:  Negative for congestion.   Respiratory:  Negative for cough, chest tightness and shortness of breath.   Cardiovascular:  Negative for chest pain, palpitations and leg swelling.  Gastrointestinal:  Negative for abdominal distention, abdominal pain, constipation, diarrhea, nausea and vomiting.  Endocrine: Positive for polyuria (lasix, jardiance). Negative for polydipsia and polyphagia.  Genitourinary:  Negative for difficulty urinating, dysuria, flank pain and frequency.  Musculoskeletal:  Negative for arthralgias.  Neurological:  Negative for dizziness, syncope, weakness and headaches.    Per HPI unless specifically indicated above     Objective:    There were no vitals taken for this visit.  Wt Readings from Last 3 Encounters:  12/10/22 173 lb (78.5 kg)  12/03/22 173 lb 8 oz (78.7 kg)  10/02/22 167 lb 11.2 oz (76.1 kg)    Physical Exam Constitutional:      Appearance: Normal appearance. She is obese.  HENT:     Head: Normocephalic.     Nose: Nose normal. No congestion.     Mouth/Throat:     Mouth: Mucous membranes are moist.  Eyes:     Pupils: Pupils are equal, round, and reactive to light.  Cardiovascular:     Rate and Rhythm: Normal rate and regular rhythm.     Pulses: Normal pulses.     Heart sounds: Normal heart sounds. No murmur heard.    No gallop.  Pulmonary:     Effort: Pulmonary effort is normal. No respiratory distress.     Breath sounds: Normal  breath sounds. No stridor. No wheezing or rhonchi.  Chest:     Chest wall: No tenderness.  Abdominal:     General: Bowel sounds are normal.     Palpations: Abdomen is soft.     Tenderness: There is no abdominal  tenderness.  Musculoskeletal:     Cervical back: Normal range of motion.     Right lower leg: No edema (Non-pitting).     Left lower leg: No edema (Non-pitting).  Skin:    General: Skin is warm and dry.     Capillary Refill: Capillary refill takes 2 to 3 seconds.  Neurological:     General: No focal deficit present.     Mental Status: She is alert and oriented to person, place, and time. Mental status is at baseline.  Psychiatric:        Mood and Affect: Mood normal.        Behavior: Behavior normal.        Thought Content: Thought content normal.        Judgment: Judgment normal.   Results for orders placed or performed in visit on 10/02/22  HgB A1c  Result Value Ref Range   Hgb A1c MFr Bld 6.6 (H) 4.8 - 5.6 %   Est. average glucose Bld gHb Est-mCnc 143 mg/dL  Comp Met (CMET)  Result Value Ref Range   Glucose 102 (H) 70 - 99 mg/dL   BUN 20 8 - 27 mg/dL   Creatinine, Ser 1.36 (H) 0.57 - 1.00 mg/dL   eGFR 43 (L) >59 mL/min/1.73   BUN/Creatinine Ratio 15 12 - 28   Sodium 140 134 - 144 mmol/L   Potassium 4.2 3.5 - 5.2 mmol/L   Chloride 103 96 - 106 mmol/L   CO2 24 20 - 29 mmol/L   Calcium 9.2 8.7 - 10.3 mg/dL   Total Protein 6.7 6.0 - 8.5 g/dL   Albumin 4.1 3.9 - 4.9 g/dL   Globulin, Total 2.6 1.5 - 4.5 g/dL   Albumin/Globulin Ratio 1.6 1.2 - 2.2   Bilirubin Total 0.4 0.0 - 1.2 mg/dL   Alkaline Phosphatase 77 44 - 121 IU/L   AST 23 0 - 40 IU/L   ALT 13 0 - 32 IU/L  Lipid Profile  Result Value Ref Range   Cholesterol, Total 219 (H) 100 - 199 mg/dL   Triglycerides 156 (H) 0 - 149 mg/dL   HDL 60 >39 mg/dL   VLDL Cholesterol Cal 28 5 - 40 mg/dL   LDL Chol Calc (NIH) 131 (H) 0 - 99 mg/dL   Chol/HDL Ratio 3.7 0.0 - 4.4 ratio  B12  Result Value Ref Range   Vitamin B-12 >2000 (H) 232 - 1245 pg/mL  CBC w/Diff  Result Value Ref Range   WBC 6.2 3.4 - 10.8 x10E3/uL   RBC 3.83 3.77 - 5.28 x10E6/uL   Hemoglobin 12.1 11.1 - 15.9 g/dL   Hematocrit 35.6 34.0 - 46.6 %   MCV 93  79 - 97 fL   MCH 31.6 26.6 - 33.0 pg   MCHC 34.0 31.5 - 35.7 g/dL   RDW 12.8 11.7 - 15.4 %   Platelets 186 150 - 450 x10E3/uL   Neutrophils 59 Not Estab. %   Lymphs 31 Not Estab. %   Monocytes 7 Not Estab. %   Eos 2 Not Estab. %   Basos 1 Not Estab. %   Neutrophils Absolute 3.7 1.4 - 7.0 x10E3/uL   Lymphocytes Absolute 2.0 0.7 - 3.1 x10E3/uL  Monocytes Absolute 0.4 0.1 - 0.9 x10E3/uL   EOS (ABSOLUTE) 0.1 0.0 - 0.4 x10E3/uL   Basophils Absolute 0.0 0.0 - 0.2 x10E3/uL   Immature Granulocytes 0 Not Estab. %   Immature Grans (Abs) 0.0 0.0 - 0.1 x10E3/uL  Microalbumin, Urine Waived  Result Value Ref Range   Microalb, Ur Waived 150 (H) 0 - 19 mg/L   Creatinine, Urine Waived 50 10 - 300 mg/dL   Microalb/Creat Ratio >300 (H) <30 mg/g      Assessment & Plan:   Problem List Items Addressed This Visit      Cardiovascular and Mediastinum   Acute on chronic diastolic CHF (congestive heart failure) (HCC) (Chronic)   Amanda associated with diabetes (HCC) - Primary   AF (paroxysmal atrial fibrillation) (HCC)   Chronic diastolic CHF (congestive heart failure), NYHA class 2 (HCC)     Endocrine   Hyperlipidemia associated with type 2 diabetes mellitus ([place])   Uncontrolled type 2 diabetes mellitus with hyperglycemia, without long-term current use of insulin (HCC)     Genitourinary   CKD (chronic kidney disease) stage 3, GFR 30-59 ml/min (HCC)     Other   Normocytic anemia (Chronic)   B12 deficiency   Anemia     Follow up plan: No follow-ups on file.

## 2022-12-31 NOTE — Progress Notes (Signed)
Spoke with patient this morning and completed the LIS application online. The application was submitted and patient will get a letter from [facility] stating if she was approved or denied.   Amanda [name]

## 2023-01-01 ENCOUNTER — Encounter: Payer: Self-pay | Admitting: Nurse Practitioner

## 2023-01-01 ENCOUNTER — Ambulatory Visit (INDEPENDENT_AMBULATORY_CARE_PROVIDER_SITE_OTHER): Payer: Medicare HMO | Admitting: Nurse Practitioner

## 2023-01-01 VITALS — BP 120/50 | HR 69 | Temp 97.6°F | Wt 178.4 lb

## 2023-01-01 DIAGNOSIS — D649 Anemia, unspecified: Secondary | ICD-10-CM

## 2023-01-01 DIAGNOSIS — D61818 Other pancytopenia: Secondary | ICD-10-CM

## 2023-01-01 DIAGNOSIS — E1169 Type 2 diabetes mellitus with other specified complication: Secondary | ICD-10-CM

## 2023-01-01 DIAGNOSIS — E1159 Type 2 diabetes mellitus with other circulatory complications: Secondary | ICD-10-CM | POA: Diagnosis not present

## 2023-01-01 DIAGNOSIS — N1831 Chronic kidney disease, stage 3a: Secondary | ICD-10-CM

## 2023-01-01 DIAGNOSIS — I152 Hypertension secondary to endocrine disorders: Secondary | ICD-10-CM | POA: Diagnosis not present

## 2023-01-01 DIAGNOSIS — I5032 Chronic diastolic (congestive) heart failure: Secondary | ICD-10-CM | POA: Diagnosis not present

## 2023-01-01 DIAGNOSIS — E785 Hyperlipidemia, unspecified: Secondary | ICD-10-CM

## 2023-01-01 DIAGNOSIS — E1165 Type 2 diabetes mellitus with hyperglycemia: Secondary | ICD-10-CM

## 2023-01-01 DIAGNOSIS — I5033 Acute on chronic diastolic (congestive) heart failure: Secondary | ICD-10-CM

## 2023-01-01 DIAGNOSIS — I480 Paroxysmal atrial fibrillation: Secondary | ICD-10-CM | POA: Diagnosis not present

## 2023-01-01 DIAGNOSIS — E538 Deficiency of other specified B group vitamins: Secondary | ICD-10-CM | POA: Diagnosis not present

## 2023-01-01 NOTE — Assessment & Plan Note (Addendum)
Chronic.  Controlled.  Continue with current medication regimen.  Labs ordered today.  Return to clinic in 3 months for reevaluation.  Call sooner if concerns arise.   

## 2023-01-01 NOTE — Assessment & Plan Note (Signed)
Labs ordered at visit today.  Will make recommendations based on lab results.   

## 2023-01-01 NOTE — Assessment & Plan Note (Signed)
Labs ordered at visit today.  Will make recommendations based on lab results. Last B12 in December was >2000.

## 2023-01-01 NOTE — Assessment & Plan Note (Signed)
Chronic.  Controlled.  Continue with current medication regimen.  Labs ordered today.  Return to clinic in 3 months for reevaluation.  Call sooner if concerns arise.   

## 2023-01-01 NOTE — Assessment & Plan Note (Signed)
Pt in normal rate/rhythm in office. Patient states she was taken off Amiodarone and Eliquis by Dr. Nehemiah Massed.  Will continue to monitor at future visits.

## 2023-01-01 NOTE — Assessment & Plan Note (Signed)
Chronic.  Controlled.  BP check/log reviewed, pt blood pressure well controlled. No changes made to current regimen. Labs ordered today. Will make recommendations based on lab results.

## 2023-01-01 NOTE — Assessment & Plan Note (Signed)
Chronic.  Controlled.  Continue with current medication regimen.  Followed by the HF clinic.  Following up with Cardiology Annually.  Labs ordered today.  Return to clinic in 3 months for reevaluation.  Call sooner if concerns arise.   - Reminded to call for an overnight weight gain of >2 pounds or a weekly weight gain of >5 pounds - not adding salt to food and read food labels. Reviewed the importance of keeping daily sodium intake to '2000mg'$  daily. - Avoid Ibuprofen products.

## 2023-01-01 NOTE — Assessment & Plan Note (Signed)
Lipid panel drawn in clinic today.  Will start Crestor '5mg'$  daily.  Side effects and benefits of medication discussed during visit.  Will increase as patient tolerates the medication.  Follow up in 3 months.  Call sooner if concerns arise.

## 2023-01-01 NOTE — Assessment & Plan Note (Signed)
Chronic. Well controlled. Pt tolerating ozempic. Denies any adverse side effects at this time.  A1c well controlled at 6.6%.   No change is required for any medications.  Follow up in 3 months.  Call sooner if concerns arise.

## 2023-01-02 LAB — COMPREHENSIVE METABOLIC PANEL
ALT: 16 IU/L (ref 0–32)
AST: 23 IU/L (ref 0–40)
Albumin/Globulin Ratio: 1.7 (ref 1.2–2.2)
Albumin: 4.3 g/dL (ref 3.9–4.9)
Alkaline Phosphatase: 83 IU/L (ref 44–121)
BUN/Creatinine Ratio: 18 (ref 12–28)
BUN: 20 mg/dL (ref 8–27)
Bilirubin Total: 0.3 mg/dL (ref 0.0–1.2)
CO2: 24 mmol/L (ref 20–29)
Calcium: 9.5 mg/dL (ref 8.7–10.3)
Chloride: 104 mmol/L (ref 96–106)
Creatinine, Ser: 1.14 mg/dL — ABNORMAL HIGH (ref 0.57–1.00)
Globulin, Total: 2.5 g/dL (ref 1.5–4.5)
Glucose: 143 mg/dL — ABNORMAL HIGH (ref 70–99)
Potassium: 4.1 mmol/L (ref 3.5–5.2)
Sodium: 141 mmol/L (ref 134–144)
Total Protein: 6.8 g/dL (ref 6.0–8.5)
eGFR: 53 mL/min/{1.73_m2} — ABNORMAL LOW (ref >59)

## 2023-01-02 LAB — CBC WITH DIFFERENTIAL/PLATELET
Basophils Absolute: 0 10*3/uL (ref 0.0–0.2)
Basos: 1 %
EOS (ABSOLUTE): 0.2 10*3/uL (ref 0.0–0.4)
Eos: 4 %
Hematocrit: 36 % (ref 34.0–46.6)
Hemoglobin: 12.1 g/dL (ref 11.1–15.9)
Immature Grans (Abs): 0 10*3/uL (ref 0.0–0.1)
Immature Granulocytes: 0 %
Lymphocytes Absolute: 2 10*3/uL (ref 0.7–3.1)
Lymphs: 34 %
MCH: 31.4 pg (ref 26.6–33.0)
MCHC: 33.6 g/dL (ref 31.5–35.7)
MCV: 94 fL (ref 79–97)
Monocytes Absolute: 0.4 10*3/uL (ref 0.1–0.9)
Monocytes: 7 %
Neutrophils Absolute: 3.1 10*3/uL (ref 1.4–7.0)
Neutrophils: 54 %
Platelets: 165 10*3/uL (ref 150–450)
RBC: 3.85 x10E6/uL (ref 3.77–5.28)
RDW: 12.8 % (ref 11.7–15.4)
WBC: 5.7 10*3/uL (ref 3.4–10.8)

## 2023-01-02 LAB — HEMOGLOBIN A1C
Est. average glucose Bld gHb Est-mCnc: 143 mg/dL
Hgb A1c MFr Bld: 6.6 % — ABNORMAL HIGH (ref 4.8–5.6)

## 2023-01-02 LAB — VITAMIN B12: Vitamin B-12: >2000 pg/mL — ABNORMAL HIGH (ref 232–1245)

## 2023-01-02 NOTE — Progress Notes (Signed)
Hi Amanda [name]. It was nice to see you yesterday.  Your lab work looks good. Your kidney function remains stable.  Your A1c is well controlled at 6.6%.  No concerns at this time. Continue with your current medication regimen.  Follow up as discussed.  Please let me know if you have any questions.

## 2023-01-07 ENCOUNTER — Other Ambulatory Visit: Payer: Self-pay | Admitting: Nurse Practitioner

## 2023-01-08 NOTE — Telephone Encounter (Signed)
Requested Prescriptions  Pending Prescriptions Disp Refills   OZEMPIC, 1 MG/DOSE, 4 MG/3ML SOPN [Pharmacy Med Name: Ozempic (1 MG/DOSE) 4 MG/3ML Subcutaneous Solution Pen-injector] 3 mL 0    Sig: INJECT 1 MG SUBCUTANEOUSLY ONCE WEEKLY     Endocrinology:  Diabetes - GLP-1 Receptor Agonists - semaglutide Failed - 01/07/2023 10:20 AM      Failed - HBA1C in normal range and within 180 days    Hemoglobin A1C  Date Value Ref Range Status  06/12/2016 7.8  Final   HB A1C (BAYER DCA - WAIVED)  Date Value Ref Range Status  01/22/2022 6.9 (H) 4.8 - 5.6 % Final    Comment:             Prediabetes: 5.7 - 6.4          Diabetes: >6.4          Glycemic control for adults with diabetes: <7.0    Hgb A1c MFr Bld  Date Value Ref Range Status  01/01/2023 6.6 (H) 4.8 - 5.6 % Final    Comment:             Prediabetes: 5.7 - 6.4          Diabetes: >6.4          Glycemic control for adults with diabetes: <7.0          Failed - Cr in normal range and within 360 days    Creatinine, Ser  Date Value Ref Range Status  01/01/2023 1.14 (H) 0.57 - 1.00 mg/dL Final         Passed - Valid encounter within last 6 months    Recent Outpatient Visits           1 week ago Hypertension associated with diabetes [facility])   [place], Karen, NP   3 months ago AF (paroxysmal atrial fibrillation) [facility])   [place], Karen, NP   6 months ago Acute on chronic diastolic CHF (congestive heart failure) [facility])   [place] Jon Billings, NP   9 months ago Hypertension associated with diabetes ([place])   [place] Vigg, [name], MD   11 months ago Cholecystitis   [place] Charlynne Cousins, MD       Future Appointments             In 2 months Jon Billings, NP [place], [name]

## 2023-01-22 ENCOUNTER — Telehealth: Payer: Self-pay

## 2023-01-24 ENCOUNTER — Telehealth: Payer: Self-pay

## 2023-01-24 IMAGING — CR DG CHEST 2V
1 series · 2 of 2 positions shown · non-contrast
Comparison: Radiograph 11/02/2021

CLINICAL DATA: Shortness of breath, cough

EXAM:
CHEST - 2 VIEW

[Series 1: dg chest 2 view · 0.14mm/px · 2 of 2 slices shown]
[im 1/2]
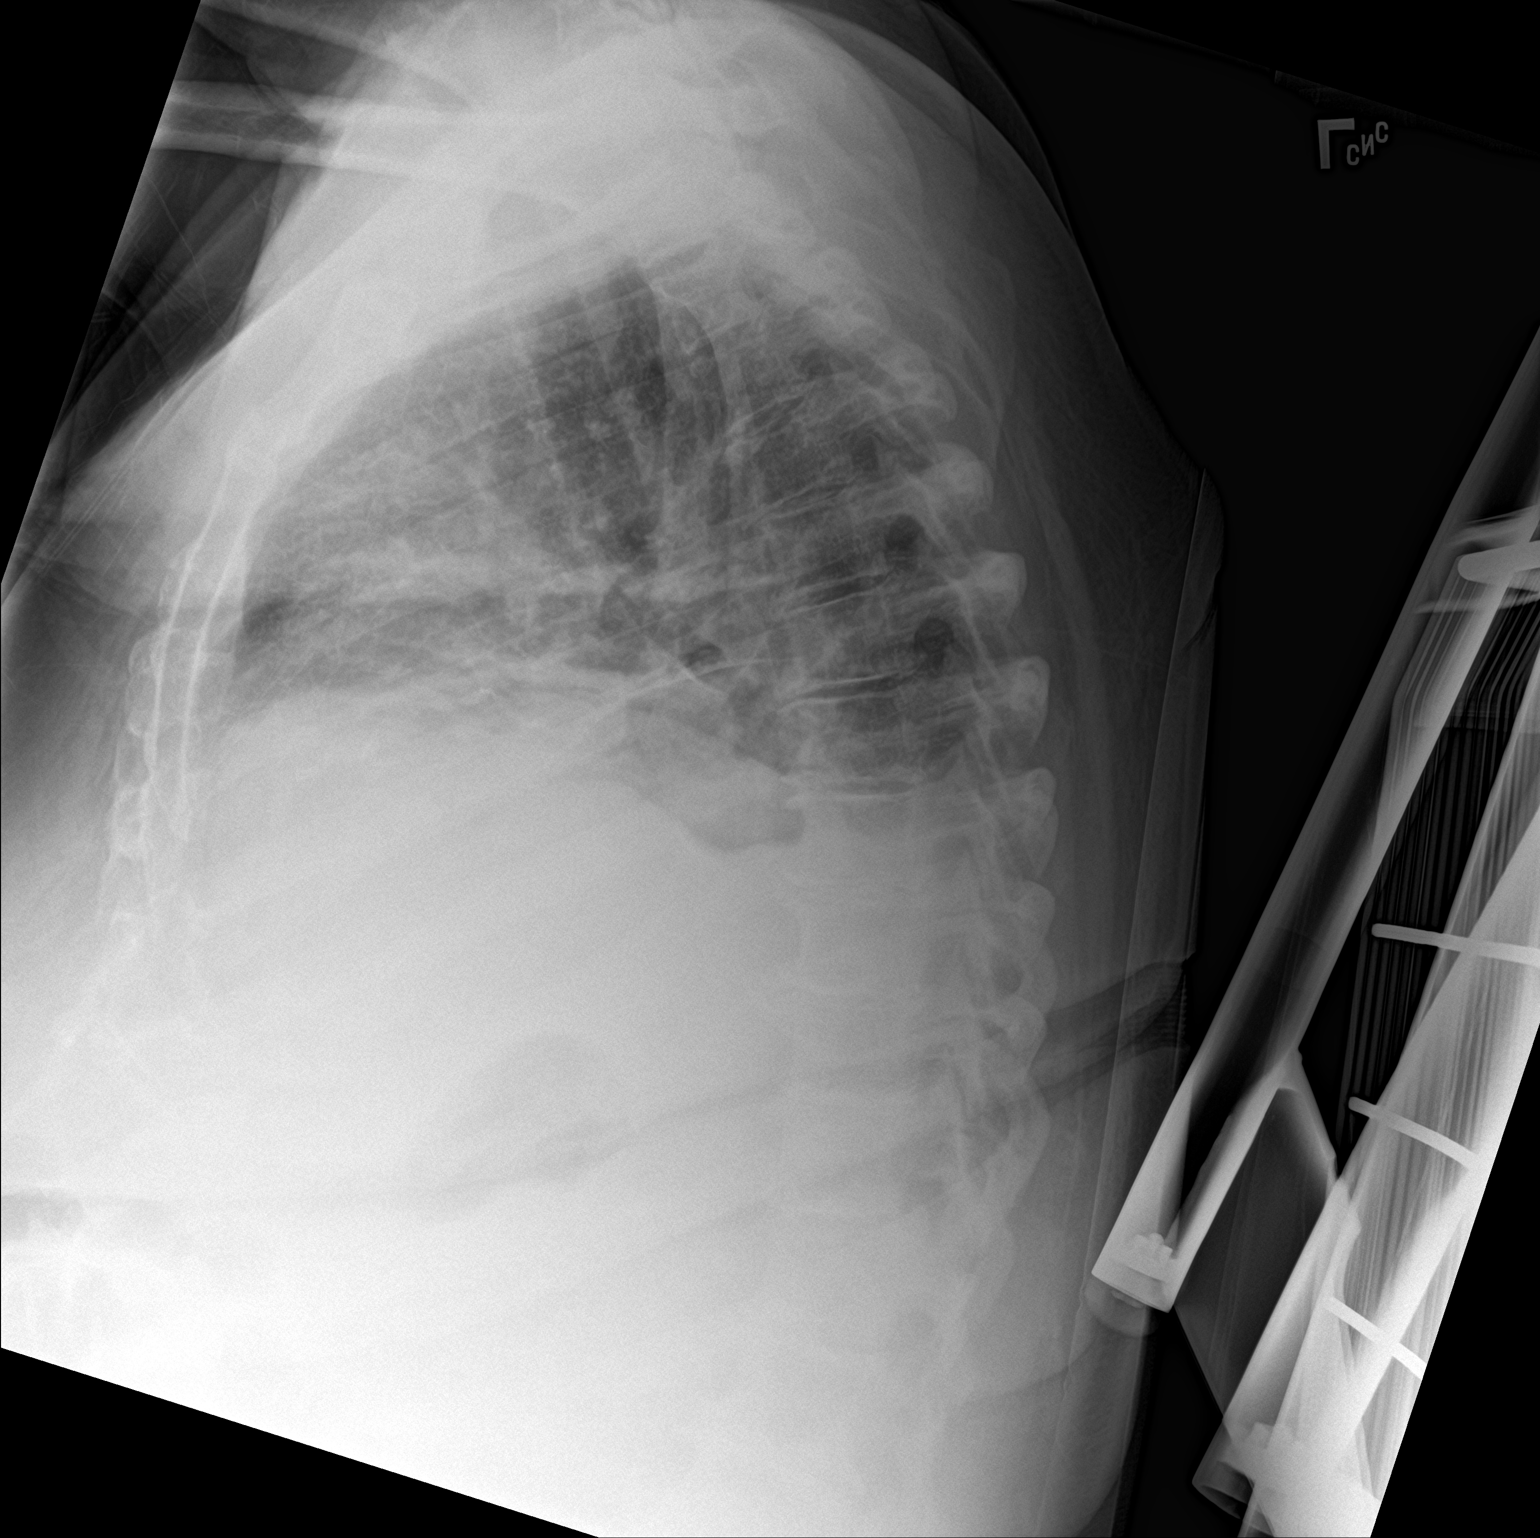
[im 2/2]
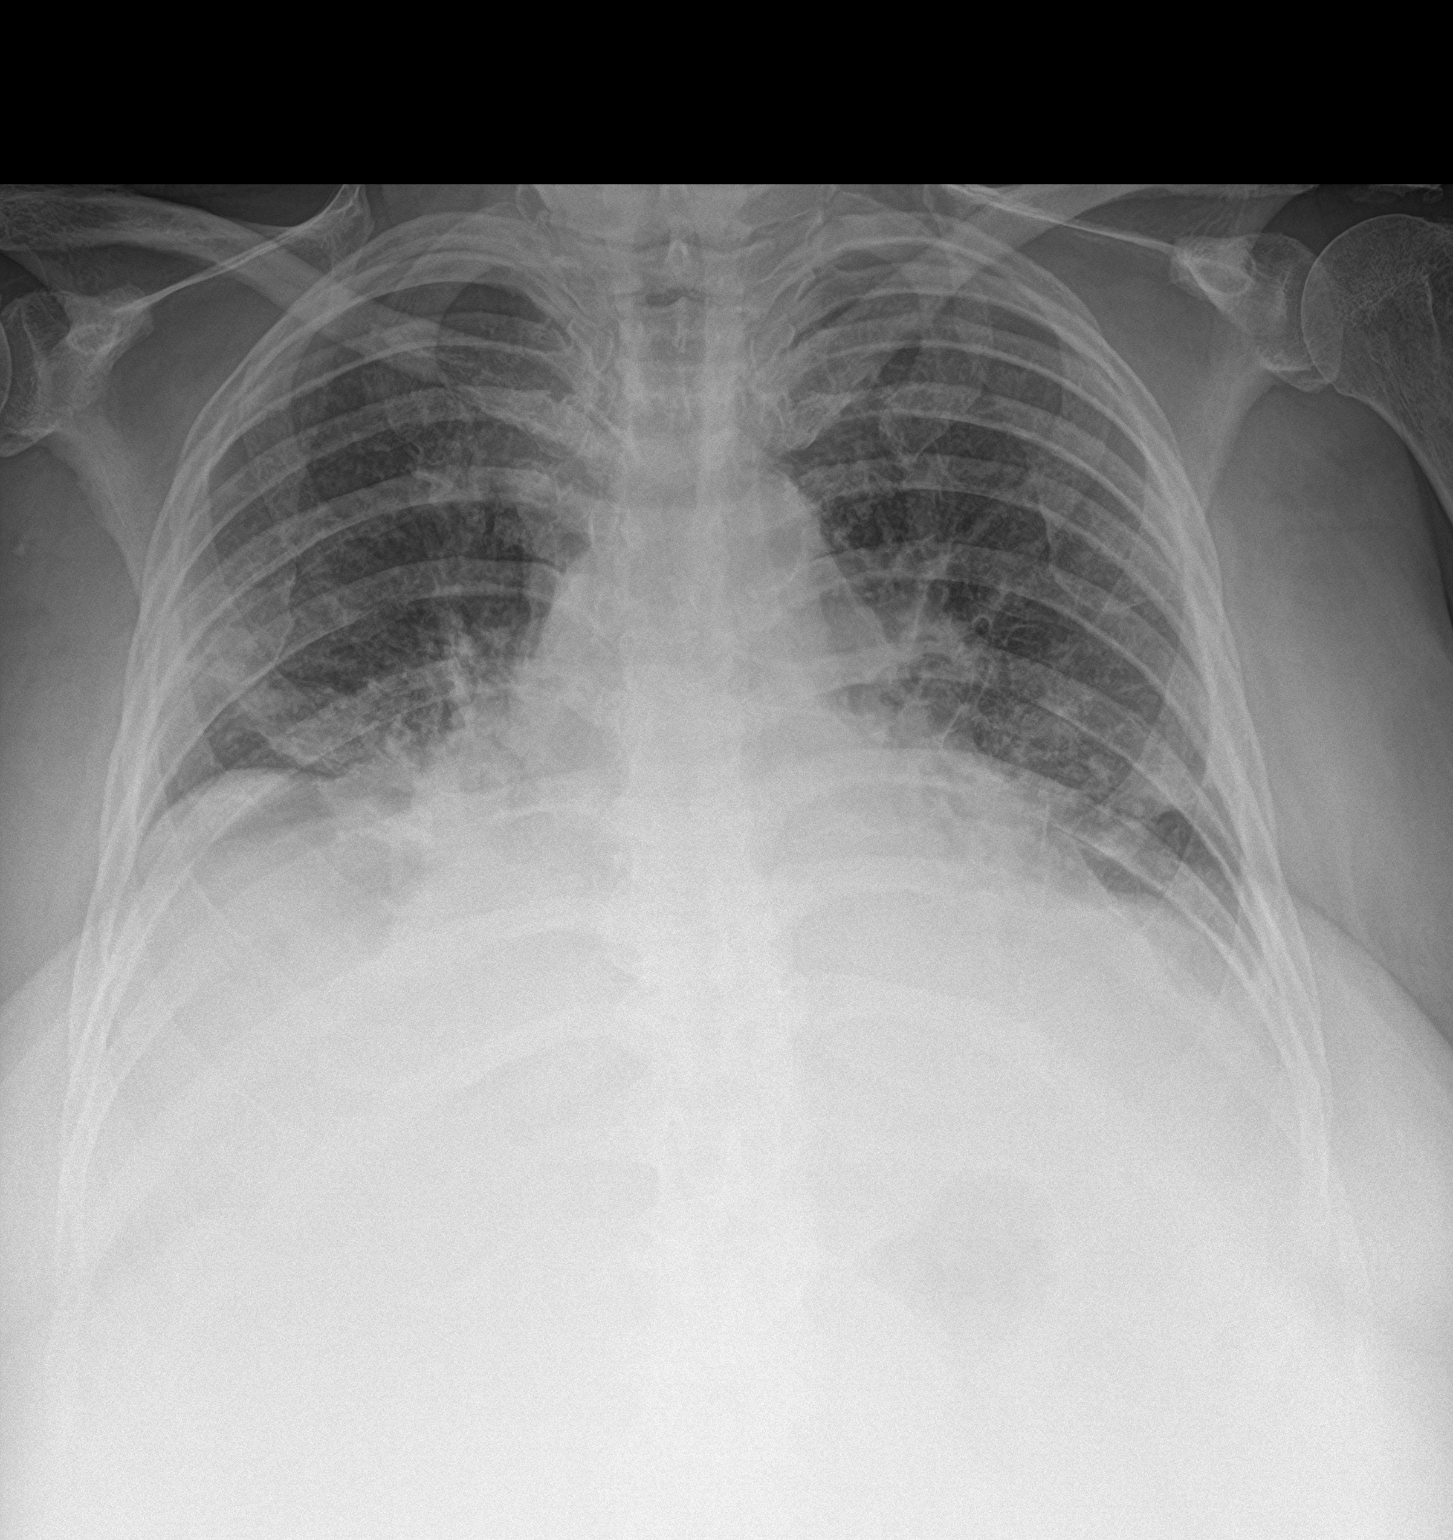

[2 of 2 positions shown; findings below may reference images not displayed]

FINDINGS: Enlarged cardiac silhouette. There are diffuse interstitial and
lower lung predominant airspace opacities. Probable trace effusions.
Low lung volumes. No visible pneumothorax. No acute osseous
abnormality. Thoracic spondylosis.
IMPRESSION: Cardiomegaly with mild pulmonary edema. Probable trace pleural
effusions.

## 2023-01-24 IMAGING — US US EXTREM LOW VENOUS
1 series · 13 of 24 positions shown · non-contrast
Comparison: None.

CLINICAL DATA: Bilateral lower extremity swelling.



[Series 1: us venous img lower bilat (dvt) · portal-venous · 13 of 58 slices shown]
[im 1/58]
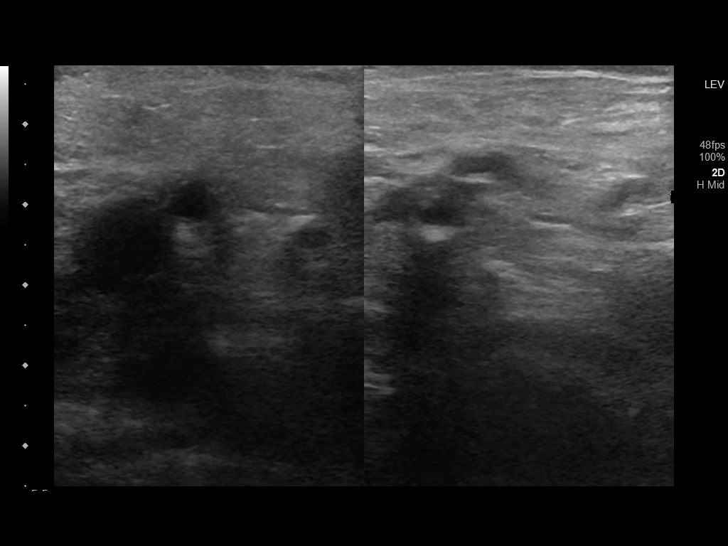
[im 5/58]
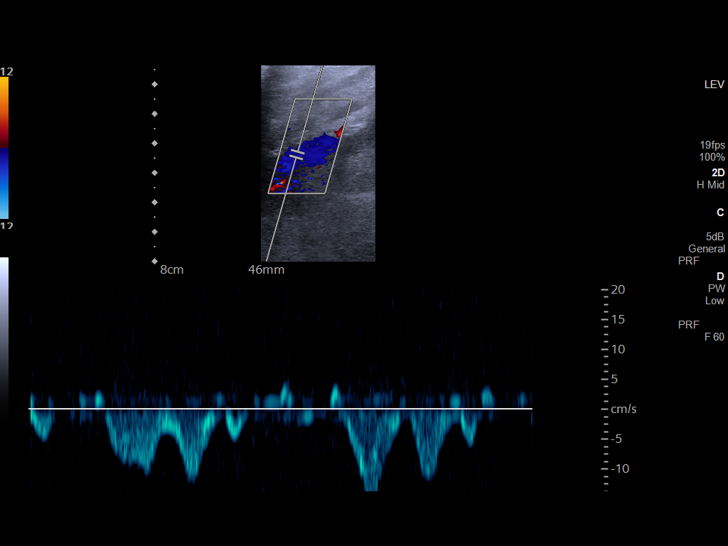
[im 10/58]
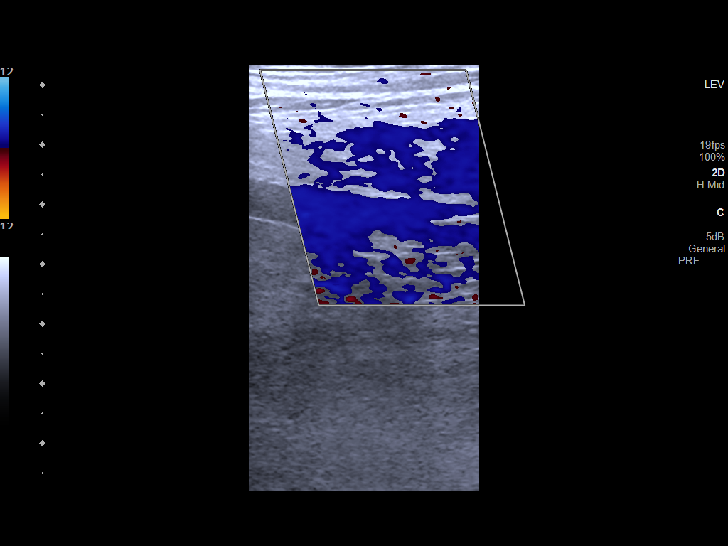
[im 15/58]
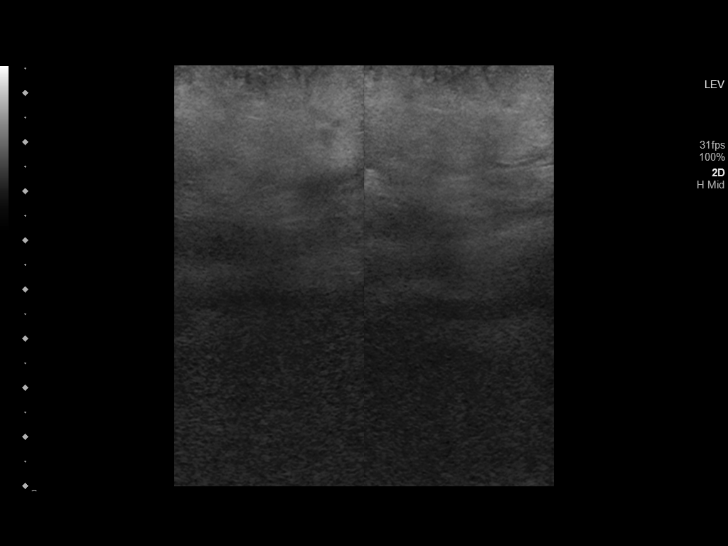
[im 20/58]
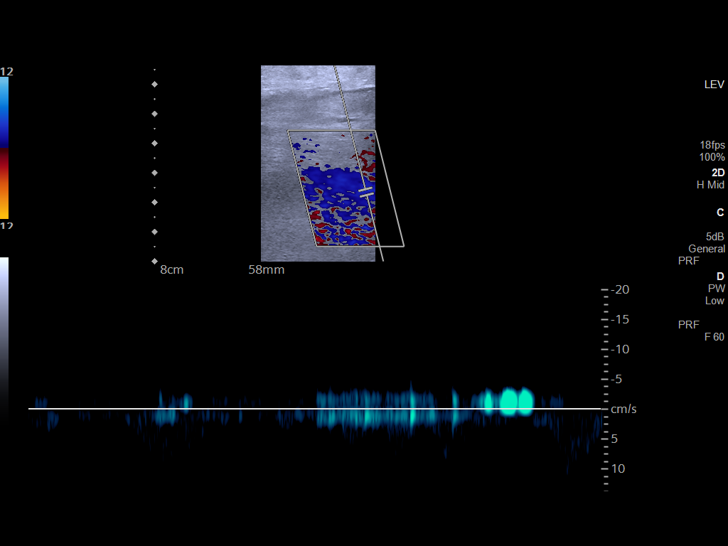
[im 25/58]
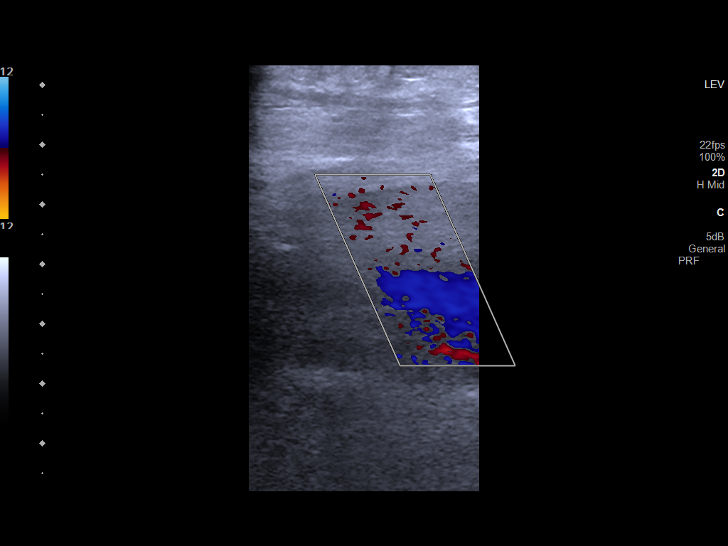
[im 30/58]
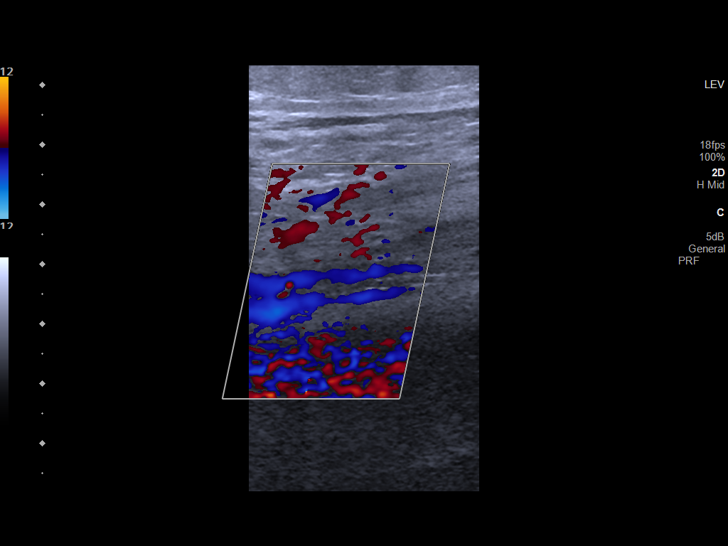
[im 33/58]
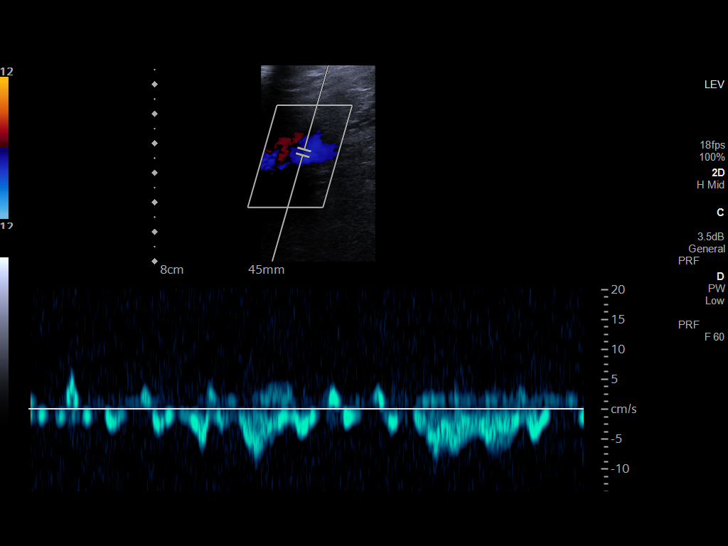
[im 38/58]
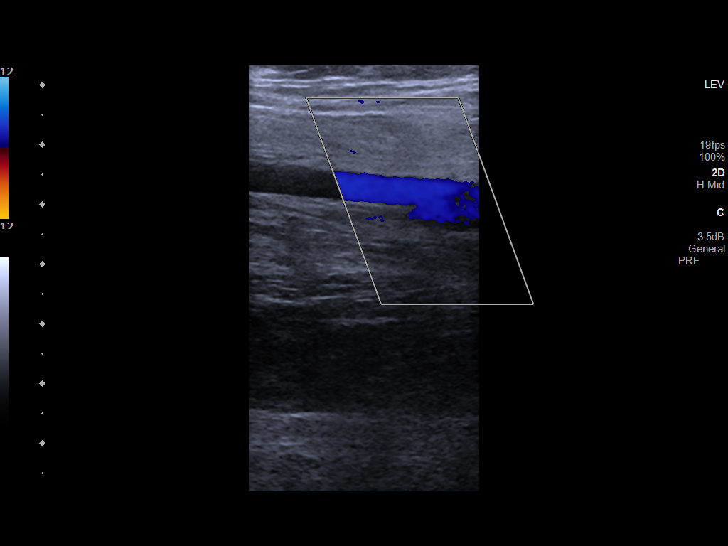
[im 43/58]
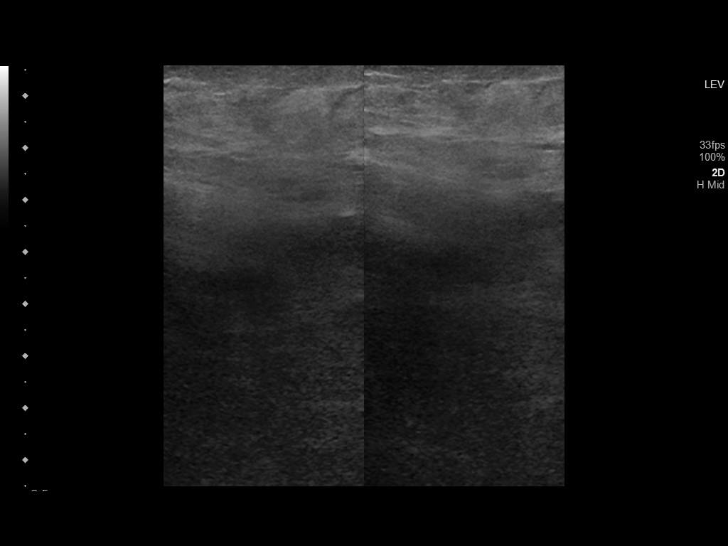
[im 48/58]
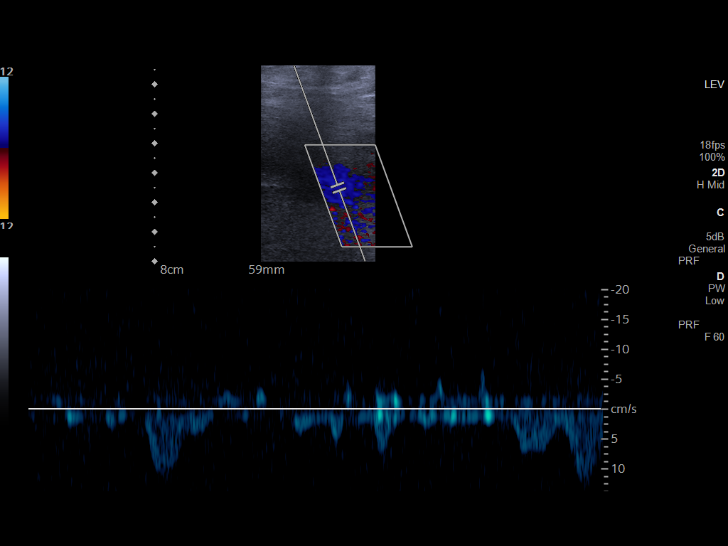
[im 53/58]
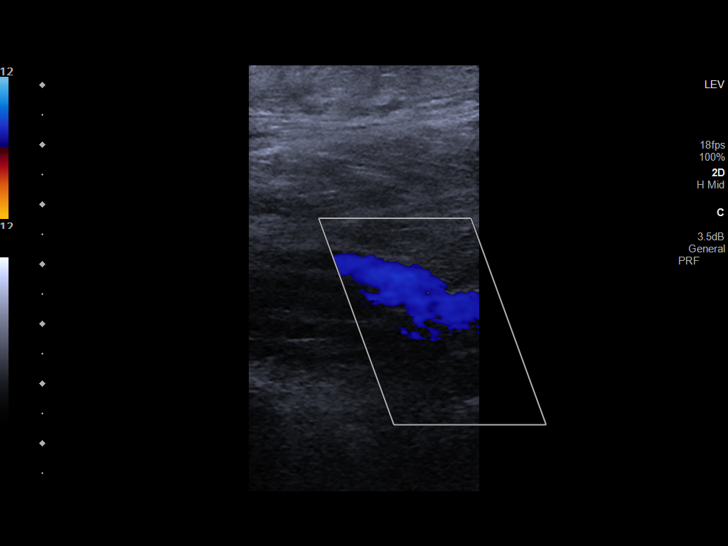
[im 58/58]
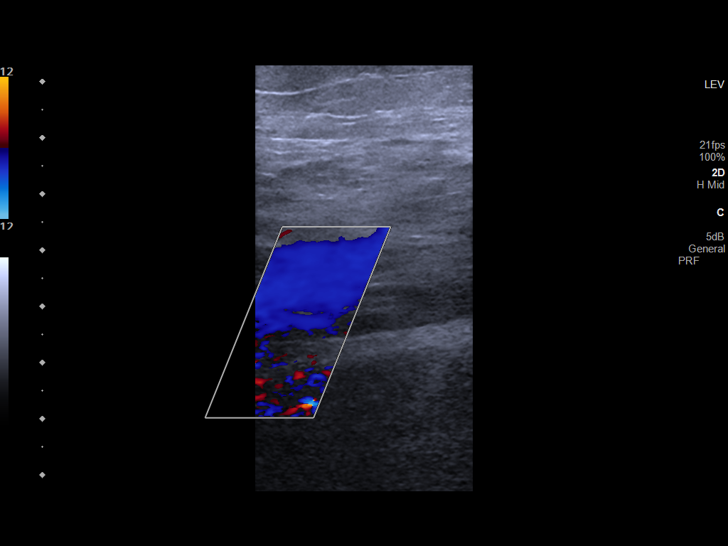

[13 of 24 positions shown; findings below may reference images not displayed]

FINDINGS: RIGHT LOWER EXTREMITY

Common Femoral Vein: No evidence of thrombus. Normal
compressibility, respiratory phasicity and response to augmentation.

Saphenofemoral Junction: No evidence of thrombus. Normal
compressibility and flow on color Doppler imaging.

Profunda Femoral Vein: No evidence of thrombus. Normal
compressibility and flow on color Doppler imaging.

Femoral Vein: No evidence of thrombus. Normal compressibility,
respiratory phasicity and response to augmentation.

Popliteal Vein: No evidence of thrombus. Normal compressibility,
respiratory phasicity and response to augmentation.

Calf Veins: No evidence of thrombus. Normal compressibility and flow
on color Doppler imaging.

Superficial Great Saphenous Vein: No evidence of thrombus. Normal
compressibility.

Venous Reflux:  None.

Other Findings:  None.

LEFT LOWER EXTREMITY

Common Femoral Vein: No evidence of thrombus. Normal
compressibility, respiratory phasicity and response to augmentation.

Saphenofemoral Junction: No evidence of thrombus. Normal
compressibility and flow on color Doppler imaging.

Profunda Femoral Vein: No evidence of thrombus. Normal
compressibility and flow on color Doppler imaging.

Femoral Vein: No evidence of thrombus. Normal compressibility,
respiratory phasicity and response to augmentation.

Popliteal Vein: No evidence of thrombus. Normal compressibility,
respiratory phasicity and response to augmentation.

Calf Veins: No evidence of thrombus. Normal compressibility and flow
on color Doppler imaging.

Superficial Great Saphenous Vein: No evidence of thrombus. Normal
compressibility.

Venous Reflux:  None.

Other Findings:  None.
IMPRESSION: No evidence of deep venous thrombosis in either lower extremity.

## 2023-01-24 NOTE — Progress Notes (Signed)
Spoke with patient about cost for Jardiance, she has been approved for low income subsidy. Patient cost is $11.20, her date of delivery from [facility] is 01/30/23.  Velvet Bathe

## 2023-01-28 ENCOUNTER — Telehealth: Payer: Self-pay

## 2023-01-28 NOTE — Progress Notes (Cosign Needed)
Care Management & Coordination Services Pharmacy Team  Reason for Encounter: Diabetes  Contacted patient to discuss diabetes disease state. Spoke with patient on 02/07/2023   Recent office visits:  01/01/23-[place] Caren Griffins, NP (PCP) Seen for general follow up visit. Labs ordered. Follow up in 3 months.   Recent consult visits:  None noted  Hospital visits:  None in previous 6 months  Medications: Outpatient Encounter Medications as of 01/28/2023  Medication Sig   albuterol (VENTOLIN HFA) 108 (90 Base) MCG/ACT inhaler Inhale 2 puffs into the lungs every 6 (six) hours as needed for wheezing or shortness of breath.   Calcium Carb-Cholecalciferol (CALCIUM 600+D3) 600-200 MG-UNIT TABS Take 1 tablet by mouth in the morning.   carvedilol (COREG) 12.5 MG tablet Take 1 tablet (12.5 mg total) by mouth 2 (two) times daily with a meal.   empagliflozin (JARDIANCE) 10 MG TABS tablet Take 1 tablet (10 mg total) by mouth daily before breakfast.   ferrous sulfate 325 (65 FE) MG tablet Take 1 tablet (325 mg total) by mouth daily.   furosemide (LASIX) 20 MG tablet Take 1 tablet (20 mg total) by mouth daily.   Multiple Vitamin (MULTIVITAMIN) tablet Take 1 tablet by mouth daily.   omeprazole (PRILOSEC) 20 MG capsule Take 20 mg by mouth daily.   OZEMPIC, 1 MG/DOSE, 4 MG/3ML SOPN INJECT 1 MG SUBCUTANEOUSLY ONCE WEEKLY   potassium chloride SA (KLOR-CON M) 20 MEQ tablet TAKE ONE TABLET BY MOUTH EVERY MORNING   rosuvastatin (CRESTOR) 5 MG tablet Take 1 tablet (5 mg total) by mouth daily.   sacubitril-valsartan (ENTRESTO) 49-51 MG Take 1 tablet by mouth 2 (two) times daily.   vitamin B-12 (CYANOCOBALAMIN) 500 MCG tablet Take 500 mcg by mouth daily.   No facility-administered encounter medications on file as of 01/28/2023.    Recent Relevant Labs: Lab Results  Component Value Date/Time   HGBA1C 6.6 (H) 01/01/2023 09:26 AM   HGBA1C 6.6 (H) 10/02/2022 10:01 AM   HGBA1C 6.9 (H) 01/22/2022 03:20 PM   HGBA1C  7.0 (H) 11/22/2021 03:42 PM   HGBA1C 7.8 06/12/2016 12:00 AM   MICROALBUR 150 (H) 10/02/2022 09:59 AM   MICROALBUR 30 (H) 12/25/2020 03:03 PM    Kidney Function Lab Results  Component Value Date/Time   CREATININE 1.14 (H) 01/01/2023 09:26 AM   CREATININE 1.36 (H) 10/02/2022 10:01 AM   GFRNONAA 48 (L) 07/25/2022 08:45 AM   GFRAA 59 (L) 09/05/2020 04:15 PM   Current antihyperglycemic regimen:  Jardiance 10 mg take 1 tablet daily Ozempic  inject 1 mg weekly   Patient verbally confirms she is taking the above medications as directed. Yes  What diet changes have been made to improve diabetes control? None noted  What recent interventions/DTPs have been made to improve glycemic control:  None noted  Have there been any recent hospitalizations or ED visits since last visit with PharmD? No  Patient denies hypoglycemic symptoms, including Pale, Sweaty, Shaky, Hungry, Nervous/irritable, and Vision changes  Patient denies hyperglycemic symptoms, including blurry vision, excessive thirst, fatigue, polyuria, and weakness  How often are you checking your blood sugar? Patient states she has not been checking her BG lately  What are your blood sugars ranging?  Fasting: N/a Before meals:  N/a After meals:  N/a Bedtime:  N/a  During the week, how often does your blood glucose drop below 70? Never  Are you checking your feet daily/regularly? Yes  Adherence Review: Is the patient currently on a STATIN medication? Yes Is the patient  currently on ACE/ARB medication? No Does the patient have >5 day gap between last estimated fill dates? No  Star Rating Drugs:  Jardiance 10 mg Last filled:01/27/23 30 DS, 12/30/22 30 DS Ozempic 1 mg Last filled:01/08/23 28 DS, 12/11/22 28 DS Rosuvastatin 5 mg Last filled:01/27/23 30 DS, 12/30/22 30 DS Entresto 49-51 mg Last filled:01/16/23 30 DS, 12/13/22 30 DS  Care Gaps: Annual wellness visit in last year? Yes Last eye exam / retinopathy  screening:10/01/22 Last diabetic foot exam:01/01/23   Rance Muir, RMA

## 2023-01-29 ENCOUNTER — Ambulatory Visit: Payer: Medicare HMO | Attending: Family | Admitting: Family

## 2023-01-29 ENCOUNTER — Encounter: Payer: Self-pay | Admitting: Family

## 2023-01-29 VITALS — BP 155/62 | HR 63 | Wt 179.4 lb

## 2023-01-29 DIAGNOSIS — I428 Other cardiomyopathies: Secondary | ICD-10-CM | POA: Diagnosis not present

## 2023-01-29 DIAGNOSIS — I110 Hypertensive heart disease with heart failure: Secondary | ICD-10-CM | POA: Diagnosis not present

## 2023-01-29 DIAGNOSIS — I10 Essential (primary) hypertension: Secondary | ICD-10-CM

## 2023-01-29 DIAGNOSIS — I4891 Unspecified atrial fibrillation: Secondary | ICD-10-CM | POA: Diagnosis not present

## 2023-01-29 DIAGNOSIS — I340 Nonrheumatic mitral (valve) insufficiency: Secondary | ICD-10-CM | POA: Insufficient documentation

## 2023-01-29 DIAGNOSIS — Z7984 Long term (current) use of oral hypoglycemic drugs: Secondary | ICD-10-CM | POA: Diagnosis not present

## 2023-01-29 DIAGNOSIS — I480 Paroxysmal atrial fibrillation: Secondary | ICD-10-CM | POA: Diagnosis not present

## 2023-01-29 DIAGNOSIS — M25512 Pain in left shoulder: Secondary | ICD-10-CM | POA: Diagnosis not present

## 2023-01-29 DIAGNOSIS — E785 Hyperlipidemia, unspecified: Secondary | ICD-10-CM | POA: Diagnosis not present

## 2023-01-29 DIAGNOSIS — Z87891 Personal history of nicotine dependence: Secondary | ICD-10-CM | POA: Diagnosis not present

## 2023-01-29 DIAGNOSIS — Z8249 Family history of ischemic heart disease and other diseases of the circulatory system: Secondary | ICD-10-CM | POA: Insufficient documentation

## 2023-01-29 DIAGNOSIS — K219 Gastro-esophageal reflux disease without esophagitis: Secondary | ICD-10-CM | POA: Insufficient documentation

## 2023-01-29 DIAGNOSIS — Z79899 Other long term (current) drug therapy: Secondary | ICD-10-CM | POA: Insufficient documentation

## 2023-01-29 DIAGNOSIS — E119 Type 2 diabetes mellitus without complications: Secondary | ICD-10-CM | POA: Diagnosis not present

## 2023-01-29 DIAGNOSIS — I5032 Chronic diastolic (congestive) heart failure: Secondary | ICD-10-CM | POA: Insufficient documentation

## 2023-01-29 MED ORDER — ENTRESTO 97-103 MG PO TABS: 1.0000 | ORAL_TABLET | Freq: Two times a day (BID) | ORAL | 5 refills | Status: DC

## 2023-01-29 NOTE — Patient Instructions (Signed)
Finish your current entresto by taking 2 tablets in the morning and 2 tablets in the evening. When you get your new bottle of the 97/103mg  dose, you will resume taking 1 tablet twice daily.

## 2023-01-29 NOTE — Progress Notes (Signed)
Patient ID: Amanda [name], female    DOB: 1955-11-29, 67 y.o.   MRN: [id number]  Primary cardiologist: Arnoldo Hooker, MD (last seen 10/23; returns 10/24) PCP: Larae Grooms, NP (last seen 03/24)  HPI  Amanda [name] is a 67 y/o female with a history of DM, hyperlipidemia, HTN, anxiety, atrial fibrillation, GERD, previous tobacco use and chronic heart failure.   Echo 01/05/22: EF of 50-55% along with moderate LAE & moderate MR.   Has not been admitted or been in the ED in the last 6 months.   She presents today for a HF f/u visit with a  chief complaint of joint pain. Chronic in nature. Has had a gradual weight gain along with this. Denies difficulty sleeping, dizziness, abdominal distention, palpitations, pedal edema, chest pain, SOB, cough or fatigue.   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  Home blood pressure is running 120's/ 50's.   Past Medical History:  Diagnosis Date   Anxiety    Arthritis    Atrial fibrillation (HCC)    CHF (congestive heart failure) (HCC)    Cholecystitis    Cholecystitis    Diabetes mellitus without complication (HCC)    Dyspnea    GERD (gastroesophageal reflux disease)    Hyperlipidemia    Hypertension    Past Surgical History:  Procedure Laterality Date   CARDIOVERSION N/A 02/13/2022   Procedure: CARDIOVERSION;  Surgeon: Lamar Blinks, MD;  Location: [facility] ORS;  Service: Cardiovascular;  Laterality: N/A;   COLONOSCOPY  2012   IR CHOLANGIOGRAM EXISTING TUBE  12/11/2021   IR PERC CHOLECYSTOSTOMY  11/02/2021   Family History  Problem Relation Age of Onset   COPD Mother    Hypertension Mother    Diabetes Mother    Severe combined immunodeficiency Father    Diabetes Sister    Heart disease Sister    Heart disease Sister    Cancer Niece    Social History   Tobacco Use   Smoking status: Former    Types: Cigarettes    Quit date: 12/07/1975    Years since quitting: 47.1   Smokeless tobacco: Never  Substance Use Topics   Alcohol use: No    Allergies  Allergen Reactions   Tramadol Other (See Comments)    "woozy"   Penicillins Other (See Comments)    Dizziness Tolerated augmentin but does not like taste   Prior to Admission medications   Medication Sig Start Date End Date Taking? Authorizing Provider  albuterol (VENTOLIN HFA) 108 (90 Base) MCG/ACT inhaler Inhale 2 puffs into the lungs every 6 (six) hours as needed for wheezing or shortness of breath. 12/27/21  Yes Vigg, [name], MD  Calcium Carb-Cholecalciferol (CALCIUM 600+D3) 600-200 MG-UNIT TABS Take 1 tablet by mouth in the morning.   Yes [provider]  carvedilol (COREG) 12.5 MG tablet Take 1 tablet (12.5 mg total) by mouth 2 (two) times daily with a meal. 06/19/22  Yes Cannady, [name], NP  empagliflozin (JARDIANCE) 10 MG TABS tablet Take 1 tablet (10 mg total) by mouth daily before breakfast. 06/19/22  Yes Cannady, [name], NP  ferrous sulfate 325 (65 FE) MG tablet Take 1 tablet (325 mg total) by mouth daily. 01/08/22  Yes Osvaldo Shipper, MD  furosemide (LASIX) 20 MG tablet Take 1 tablet (20 mg total) by mouth daily. 10/02/22  Yes Larae Grooms, NP  Multiple Vitamin (MULTIVITAMIN) tablet Take 1 tablet by mouth daily.   Yes [provider]  omeprazole (PRILOSEC) 20 MG capsule Take  20 mg by mouth daily.   Yes [provider]  OZEMPIC, 1 MG/DOSE, 4 MG/3ML SOPN INJECT 1 MG SUBCUTANEOUSLY ONCE WEEKLY 01/08/23  Yes Larae GroomsHoldsworth, Karen, NP  potassium chloride SA (KLOR-CON M) 20 MEQ tablet TAKE ONE TABLET BY MOUTH EVERY MORNING 10/21/22  Yes Clarisa KindredHackney, Lafe Clerk A, FNP  rosuvastatin (CRESTOR) 5 MG tablet Take 1 tablet (5 mg total) by mouth daily. 10/02/22  Yes Larae GroomsHoldsworth, Karen, NP  sacubitril-valsartan (ENTRESTO) 49-51 MG Take 1 tablet by mouth 2 (two) times daily. 12/03/22  Yes Izrael Peak, Inetta Fermoina A, FNP  vitamin B-12 (CYANOCOBALAMIN) 500 MCG tablet Take 500 mcg by mouth daily.   Yes [provider]    Review of Systems  Constitutional:  Negative for  appetite change and fatigue.  HENT:  Negative for congestion, postnasal drip and sore throat.   Eyes: Negative.   Respiratory:  Negative for cough, chest tightness and shortness of breath.   Cardiovascular:  Negative for chest pain, palpitations and leg swelling.  Gastrointestinal:  Negative for abdominal distention and abdominal pain.  Endocrine: Negative.   Genitourinary: Negative.   Musculoskeletal:  Positive for arthralgias (left shoulder). Negative for back pain and neck pain.  Skin: Negative.   Allergic/Immunologic: Negative.   Neurological:  Negative for dizziness and light-headedness.  Hematological:  Negative for adenopathy. Does not bruise/bleed easily.  Psychiatric/Behavioral:  Negative for dysphoric mood and sleep disturbance (sleeping on 1 pillow). The patient is not nervous/anxious.    Vitals:   01/29/23 0938  BP: (!) 155/62  Pulse: 63  SpO2: 100%  Weight: 179 lb 6.4 oz (81.4 kg)   Wt Readings from Last 3 Encounters:  01/29/23 179 lb 6.4 oz (81.4 kg)  01/01/23 178 lb 6.4 oz (80.9 kg)  12/10/22 173 lb (78.5 kg)   Lab Results  Component Value Date   CREATININE 1.14 (H) 01/01/2023   CREATININE 1.36 (H) 10/02/2022   CREATININE 1.23 (H) 07/25/2022   Physical Exam Vitals and nursing note reviewed.  Constitutional:      Appearance: Normal appearance.  HENT:     Head: Normocephalic and atraumatic.  Cardiovascular:     Rate and Rhythm: Normal rate and regular rhythm.  Pulmonary:     Effort: Pulmonary effort is normal. No respiratory distress.     Breath sounds: No wheezing or rales.  Abdominal:     General: There is no distension.     Palpations: Abdomen is soft.     Tenderness: There is no abdominal tenderness.  Musculoskeletal:        General: No tenderness.     Cervical back: Normal range of motion and neck supple.     Right lower leg: No edema.     Left lower leg: No edema.  Skin:    General: Skin is warm and dry.  Neurological:     General: No focal  deficit present.     Mental Status: She is alert and oriented to person, place, and time.  Psychiatric:        Mood and Affect: Mood normal.        Behavior: Behavior normal.        Thought Content: Thought content normal.    Assessment & Plan:  1: NICM with preserved ejection fraction- - NYHA class I - euvolemic - weighing daily; reminded to call for an overnight weight gain of > 2 pounds or a weekly weight gain of > 5 pounds - weight up 6 pounds from last visit here 2 months ago -  Echo 01/05/22: EF of 50-55% along with moderate LAE & moderate MR.  - does add salt to her eggs but says that she's trying to not add any salt to anything else; trying to keep daily sodium intake to ~ 2000mg  / day - continue jardiance 10mg  daily - increase entresto to 97/103mg  BID; can finish her current bottle by taking 2 tablets BID until gone and then begin the 97/103mg  dose as 1 tablet BID - BMP next visit - BNP 01/04/22 was 537.6  2: HTN- - BP 155/62 (home BP was 121/53) - saw PCP Caren Griffins) 03/24 - BMP 01/01/23 reviewed and showed sodium 141, potassium 4.1, creatinine 1.14 and GFR 53  3: DM- - A1c 01/01/23 was 6.6%  4: Atrial fibrillation- - saw cardiology(Kowalski) 10/23; returns 10/24 - EKG done 10/23 - continue carvedilol 12.5mg  BID  Return in 1 month, sooner if needed.

## 2023-01-31 ENCOUNTER — Other Ambulatory Visit: Payer: Self-pay | Admitting: Nurse Practitioner

## 2023-01-31 NOTE — Telephone Encounter (Signed)
Requested Prescriptions  Pending Prescriptions Disp Refills   OZEMPIC, 1 MG/DOSE, 4 MG/3ML SOPN [Pharmacy Med Name: Ozempic (1 MG/DOSE) 4 MG/3ML Subcutaneous Solution Pen-injector] 3 mL 0    Sig: INJECT 1MG  SUBCUTANEOUSLY ONCE WEEKLY     Endocrinology:  Diabetes - GLP-1 Receptor Agonists - semaglutide Failed - 01/31/2023  9:13 AM      Failed - HBA1C in normal range and within 180 days    Hemoglobin A1C  Date Value Ref Range Status  06/12/2016 7.8  Final   HB A1C (BAYER DCA - WAIVED)  Date Value Ref Range Status  01/22/2022 6.9 (H) 4.8 - 5.6 % Final    Comment:             Prediabetes: 5.7 - 6.4          Diabetes: >6.4          Glycemic control for adults with diabetes: <7.0    Hgb A1c MFr Bld  Date Value Ref Range Status  01/01/2023 6.6 (H) 4.8 - 5.6 % Final    Comment:             Prediabetes: 5.7 - 6.4          Diabetes: >6.4          Glycemic control for adults with diabetes: <7.0          Failed - Cr in normal range and within 360 days    Creatinine, Ser  Date Value Ref Range Status  01/01/2023 1.14 (H) 0.57 - 1.00 mg/dL Final         Passed - Valid encounter within last 6 months    Recent Outpatient Visits           1 month ago Hypertension associated with diabetes [facility])   [place] [facility] Larae Grooms, NP   4 months ago AF (paroxysmal atrial fibrillation) [facility])   [place] [facility] Larae Grooms, NP   7 months ago Acute on chronic diastolic CHF (congestive heart failure) [facility])   [place] [facility] Larae Grooms, NP   10 months ago Hypertension associated with diabetes [facility])   [place] [name], [name], MD   1 year ago Cholecystitis   [place] [facility] Loura Pardon, MD       Future Appointments             In 2 months Larae Grooms, NP [place] [facility], [name]

## 2023-02-16 ENCOUNTER — Other Ambulatory Visit: Payer: Self-pay | Admitting: Family

## 2023-02-25 ENCOUNTER — Other Ambulatory Visit: Payer: Self-pay | Admitting: Nurse Practitioner

## 2023-02-25 NOTE — Telephone Encounter (Signed)
Requested Prescriptions  Pending Prescriptions Disp Refills   OZEMPIC, 1 MG/DOSE, 4 MG/3ML SOPN [Pharmacy Med Name: Ozempic (1 MG/DOSE) 4 MG/3ML Subcutaneous Solution Pen-injector] 3 mL 0    Sig: INJECT 1 MG SUBCUTANEOUSLY  ONCE A WEEK     Endocrinology:  Diabetes - GLP-1 Receptor Agonists - semaglutide Failed - 02/25/2023 10:43 AM      Failed - HBA1C in normal range and within 180 days    Hemoglobin A1C  Date Value Ref Range Status  06/12/2016 7.8  Final   HB A1C (BAYER DCA - WAIVED)  Date Value Ref Range Status  01/22/2022 6.9 (H) 4.8 - 5.6 % Final    Comment:             Prediabetes: 5.7 - 6.4          Diabetes: >6.4          Glycemic control for adults with diabetes: <7.0    Hgb A1c MFr Bld  Date Value Ref Range Status  01/01/2023 6.6 (H) 4.8 - 5.6 % Final    Comment:             Prediabetes: 5.7 - 6.4          Diabetes: >6.4          Glycemic control for adults with diabetes: <7.0          Failed - Cr in normal range and within 360 days    Creatinine, Ser  Date Value Ref Range Status  01/01/2023 1.14 (H) 0.57 - 1.00 mg/dL Final         Passed - Valid encounter within last 6 months    Recent Outpatient Visits           1 month ago Hypertension associated with diabetes [facility])   [place] [facility] Larae Grooms, NP   4 months ago AF (paroxysmal atrial fibrillation) [facility])   [place] [facility] Larae Grooms, NP   7 months ago Acute on chronic diastolic CHF (congestive heart failure) [facility])   [place] [facility] Larae Grooms, NP   11 months ago Hypertension associated with diabetes (HCC)   [place] [name], [name], MD   1 year ago Cholecystitis   [place] [name], Roma Schanz, MD       Future Appointments             In 1 month Larae Grooms, NP [place] [facility], [name]

## 2023-03-07 ENCOUNTER — Ambulatory Visit: Payer: Medicare HMO | Attending: Family | Admitting: Family

## 2023-03-07 ENCOUNTER — Encounter: Payer: Self-pay | Admitting: Family

## 2023-03-07 VITALS — BP 150/67 | HR 72 | Wt 183.2 lb

## 2023-03-07 DIAGNOSIS — Z5986 Financial insecurity: Secondary | ICD-10-CM | POA: Insufficient documentation

## 2023-03-07 DIAGNOSIS — I428 Other cardiomyopathies: Secondary | ICD-10-CM | POA: Insufficient documentation

## 2023-03-07 DIAGNOSIS — Z79899 Other long term (current) drug therapy: Secondary | ICD-10-CM | POA: Insufficient documentation

## 2023-03-07 DIAGNOSIS — Z7984 Long term (current) use of oral hypoglycemic drugs: Secondary | ICD-10-CM | POA: Diagnosis not present

## 2023-03-07 DIAGNOSIS — I10 Essential (primary) hypertension: Secondary | ICD-10-CM | POA: Diagnosis not present

## 2023-03-07 DIAGNOSIS — I509 Heart failure, unspecified: Secondary | ICD-10-CM | POA: Diagnosis not present

## 2023-03-07 DIAGNOSIS — E785 Hyperlipidemia, unspecified: Secondary | ICD-10-CM | POA: Diagnosis not present

## 2023-03-07 DIAGNOSIS — I110 Hypertensive heart disease with heart failure: Secondary | ICD-10-CM | POA: Diagnosis not present

## 2023-03-07 DIAGNOSIS — I5032 Chronic diastolic (congestive) heart failure: Secondary | ICD-10-CM

## 2023-03-07 DIAGNOSIS — Z87891 Personal history of nicotine dependence: Secondary | ICD-10-CM | POA: Diagnosis not present

## 2023-03-07 DIAGNOSIS — I4891 Unspecified atrial fibrillation: Secondary | ICD-10-CM | POA: Diagnosis not present

## 2023-03-07 DIAGNOSIS — K219 Gastro-esophageal reflux disease without esophagitis: Secondary | ICD-10-CM | POA: Insufficient documentation

## 2023-03-07 DIAGNOSIS — I480 Paroxysmal atrial fibrillation: Secondary | ICD-10-CM | POA: Diagnosis not present

## 2023-03-07 DIAGNOSIS — E119 Type 2 diabetes mellitus without complications: Secondary | ICD-10-CM | POA: Diagnosis not present

## 2023-03-07 MED ORDER — SPIRONOLACTONE 25 MG PO TABS: 12.5000 mg | ORAL_TABLET | Freq: Every day | ORAL | 3 refills | Status: DC

## 2023-03-07 NOTE — Progress Notes (Signed)
PCP: Larae Grooms, NP (last seen 03/24) Primary Cardiologist: Arnoldo Hooker, MD (last seen 10/23; returns 10/24)  HPI:  Ms Zeno is a 67 y/o female with a history of DM, hyperlipidemia, HTN, anxiety, atrial fibrillation, GERD, previous tobacco use and chronic heart failure.   Echo 01/05/22: EF of 50-55% along with moderate LAE & moderate MR.   Has not been admitted or been in the ED in the last 6 months.   She presents today for a HF f/u visit with a  chief complaint of mild fatigue with moderate exertion. Chronic in nature. Has no other complaints or issues today.   At last visit 1 month ago, Sherryll Burger was increased to 97/103mg  BID & she has tolerated that without known side effects. 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  Home blood pressure is running 130-140/ 70's at home. Feels like she gets anxious when coming to appointments.   ROS: All systems negative except as listed in HPI, PMH and Problem List.  SH:  Social History   Socioeconomic History   Marital status: Single    Spouse name: [name]   Number of children: Not on file   Years of education: Not on file   Highest education level: Not on file  Occupational History   Not on file  Tobacco Use   Smoking status: Former    Types: Cigarettes    Quit date: 12/07/1975    Years since quitting: 47.2   Smokeless tobacco: Never  Vaping Use   Vaping Use: Never used  Substance and Sexual Activity   Alcohol use: No   Drug use: No   Sexual activity: Not on file  Other Topics Concern   Not on file  Social History Narrative   Not on file   Social Determinants of Health   Financial Resource Strain: High Risk (12/23/2022)   Overall Financial Resource Strain (CARDIA)    Difficulty of Paying Living Expenses: Hard  Food Insecurity: No Food Insecurity (12/10/2022)   Hunger Vital Sign    Worried About Running Out of Food in the Last Year: Never true    Ran Out of Food in the Last Year: Never true  Transportation Needs: No  Transportation Needs (12/10/2022)   PRAPARE - Administrator, Civil Service (Medical): No    Lack of Transportation (Non-Medical): No  Physical Activity: Insufficiently Active (12/10/2022)   Exercise Vital Sign    Days of Exercise per Week: 3 days    Minutes of Exercise per Session: 30 min  Stress: No Stress Concern Present (12/10/2022)   [employer] of Occupational Health - Occupational Stress Questionnaire    Feeling of Stress : Not at all  Social Connections: Socially Isolated (12/10/2022)   Social Connection and Isolation Panel [NHANES]    Frequency of Communication with Friends and Family: More than three times a week    Frequency of Social Gatherings with Friends and Family: Twice a week    Attends Religious Services: Never    Database administrator or Organizations: No    Attends Banker Meetings: Never    Marital Status: Never married  Intimate Partner Violence: Not At Risk (12/10/2022)   Humiliation, Afraid, Rape, and Kick questionnaire    Fear of Current or Ex-Partner: No    Emotionally Abused: No    Physically Abused: No    Sexually Abused: No    FH:  Family History  Problem Relation Age of Onset   COPD Mother    Hypertension  Mother    Diabetes Mother    Severe combined immunodeficiency Father    Diabetes Sister    Heart disease Sister    Heart disease Sister    Cancer Niece     Past Medical History:  Diagnosis Date   Anxiety    Arthritis    Atrial fibrillation (HCC)    CHF (congestive heart failure) (HCC)    Cholecystitis    Cholecystitis    Diabetes mellitus without complication (HCC)    Dyspnea    GERD (gastroesophageal reflux disease)    Hyperlipidemia    Hypertension     Current Outpatient Medications  Medication Sig Dispense Refill   albuterol (VENTOLIN HFA) 108 (90 Base) MCG/ACT inhaler Inhale 2 puffs into the lungs every 6 (six) hours as needed for wheezing or shortness of breath. 8 g 0   Calcium  Carb-Cholecalciferol (CALCIUM 600+D3) 600-200 MG-UNIT TABS Take 1 tablet by mouth in the morning.     carvedilol (COREG) 12.5 MG tablet Take 1 tablet (12.5 mg total) by mouth 2 (two) times daily with a meal. 180 tablet 4   empagliflozin (JARDIANCE) 10 MG TABS tablet Take 1 tablet (10 mg total) by mouth daily before breakfast. 90 tablet 4   ferrous sulfate 325 (65 FE) MG tablet Take 1 tablet (325 mg total) by mouth daily. 30 tablet 3   furosemide (LASIX) 20 MG tablet Take 1 tablet (20 mg total) by mouth daily. 90 tablet 1   Multiple Vitamin (MULTIVITAMIN) tablet Take 1 tablet by mouth daily.     omeprazole (PRILOSEC) 20 MG capsule Take 20 mg by mouth daily.     OZEMPIC, 1 MG/DOSE, 4 MG/3ML SOPN INJECT 1 MG SUBCUTANEOUSLY  ONCE A WEEK 3 mL 0   potassium chloride SA (KLOR-CON M) 20 MEQ tablet TAKE ONE TABLET BY MOUTH EVERY MORNING 30 tablet 3   rosuvastatin (CRESTOR) 5 MG tablet Take 1 tablet (5 mg total) by mouth daily. 90 tablet 1   sacubitril-valsartan (ENTRESTO) 97-103 MG Take 1 tablet by mouth 2 (two) times daily. 60 tablet 5   vitamin B-12 (CYANOCOBALAMIN) 500 MCG tablet Take 500 mcg by mouth daily.     No current facility-administered medications for this visit.   Vitals:   03/07/23 0905  BP: (!) 150/67  Pulse: 72  SpO2: 100%  Weight: 183 lb 3.2 oz (83.1 kg)   Wt Readings from Last 3 Encounters:  03/07/23 183 lb 3.2 oz (83.1 kg)  01/29/23 179 lb 6.4 oz (81.4 kg)  01/01/23 178 lb 6.4 oz (80.9 kg)   Lab Results  Component Value Date   CREATININE 1.14 (H) 01/01/2023   CREATININE 1.36 (H) 10/02/2022   CREATININE 1.23 (H) 07/25/2022   PHYSICAL EXAM:  General:  Well appearing. No resp difficulty HEENT: normal Neck: supple. JVP flat. No lymphadenopathy or thryomegaly appreciated. Cor: PMI normal. Regular rate & rhythm. No rubs, gallops or murmurs. Lungs: clear Abdomen: soft, nontender, nondistended. No hepatosplenomegaly. No bruits or masses.  Extremities: no cyanosis, clubbing,  rash, edema Neuro: alert & orientedx3, cranial nerves grossly intact. Moves all 4 extremities w/o difficulty. Affect pleasant.   ECG: today shows NSR HR 70, unchanged from previous EKG   ASSESSMENT & PLAN:  1: NICM with preserved ejection fraction- - likely HTN/ AF - NYHA class II - euvolemic - weighing daily; reminded to call for an overnight weight gain of > 2 pounds or a weekly weight gain of > 5 pounds - weight up 4 pounds from last  visit here 1 month ago - Echo 01/05/22: EF of 50-55% along with moderate LAE & moderate MR.  - does add salt to her eggs but says that she's trying to not add any salt to anything else; trying to keep daily sodium intake to ~ 2000mg  / day - continue jardiance 10mg  daily - continue carvedilol 12.5mg  BID - continue entresto to 97/103mg  BID - begin spironolactone 12.5mg  daily - she gets this from [facility] and says it will probably be delivered on Monday; put on her AVS to get BMP checked on 5/28 which will be ~ 1 week after starting spironolactone - BNP 01/04/22 was 537.6 - PharmD reconciled meds w/ patient  2: HTN- - BP 150/67; adding spiro per above - saw PCP Caren Griffins) 03/24 - BMP 01/01/23 reviewed and showed sodium 141, potassium 4.1, creatinine 1.14 and GFR 53  3: DM- - A1c 01/01/23 was 6.6%  4: Atrial fibrillation- - saw cardiology(Kowalski) 10/23; returns 10/24 - EKG today is NSR - continue carvedilol 12.5mg  BID  Return in 1 month, sooner if needed. Check BMP at next visit as well and possibly titrate spironolactone to 25mg  daily

## 2023-03-07 NOTE — Progress Notes (Signed)
[facility] HEART FAILURE CLINIC - Pharmacist Note  Amanda [name] is a 67 y.o. female with HFpEF (EF >50%) presenting to the Heart Failure Clinic for follow up. Patient reports no medication access issues and no adverse events on current regimen.She denies any recent signs or symptoms of volume overload. She keeps a log of home BPs and tries to check her weight daily. She reports that it fluctuates within ~1 lb. She reports adherence to fluid restriction.  Recent ED Visit (past 6 months): none  Guideline-Directed Medical Therapy/Evidence Based Medicine ACE/ARB/ARNI: Sacubitril/valsartan 97/103 mg BID Beta Blocker: Carvedilol 12.5 mg twice daily Aldosterone Antagonist:  none Diuretic: Furosemide 20 mg daily SGLT2i: Empagliflozin 10 mg daily  Adherence Assessment Do you ever forget to take your medication? [] Yes [x] No  Do you ever skip doses due to side effects? [] Yes [x] No  Do you have trouble affording your medicines? [] Yes [x] No  Are you ever unable to pick up your medication due to transportation difficulties? [] Yes [x] No  Do you ever stop taking your medications because you don't believe they are helping? [] Yes [x] No  Do you check your weight daily? [x] Yes [] No  Adherence strategy: pill box Barriers to obtaining medications: none reported  Diagnostics ECHO: Date 01/05/2022, EF 50-55%, no RWMA  Vitals    03/07/2023    9:05 AM 01/29/2023    9:38 AM 01/01/2023    9:16 AM  Vitals with BMI  Weight 183 lbs 3 oz 179 lbs 6 oz   Systolic 150 155 161  Diastolic 67 62 50  Pulse 72 63      Recent Labs    Latest Ref Rng & Units 01/01/2023    9:26 AM 10/02/2022   10:01 AM 07/25/2022    8:45 AM  BMP  Glucose 70 - 99 mg/dL 096  045  409   BUN 8 - 27 mg/dL 20  20  25    Creatinine 0.57 - 1.00 mg/dL 8.11  9.14  7.82   BUN/Creat Ratio 12 - 28 18  15     Sodium 134 - 144 mmol/L 141  140  139   Potassium 3.5 - 5.2 mmol/L 4.1  4.2  4.0   Chloride 96 - 106 mmol/L 104  103  102   CO2 20 - 29  mmol/L 24  24  27    Calcium 8.7 - 10.3 mg/dL 9.5  9.2  9.4     Past Medical History Past Medical History:  Diagnosis Date   Anxiety    Arthritis    Atrial fibrillation (HCC)    CHF (congestive heart failure) (HCC)    Cholecystitis    Cholecystitis    Diabetes mellitus without complication (HCC)    Dyspnea    GERD (gastroesophageal reflux disease)    Hyperlipidemia    Hypertension     Plan Continue regimen as directed by NP Annual echo due 12/2022 Add spironolactone 12.5 mg daily Check labs ~1 week after starting sprionolactone  Time spent: 10 minutes  Celene Squibb, PharmD PGY1 Pharmacy Resident 03/07/2023 9:26 AM

## 2023-03-07 NOTE — Patient Instructions (Addendum)
Start taking spironolactone as 1/2 tablet every morning.   Come to [facility] on Tuesday May 28th to get your lab work drawn. You will stop at the registration desk to get registered first before going to the lab.

## 2023-03-18 ENCOUNTER — Other Ambulatory Visit
Admission: RE | Admit: 2023-03-18 | Discharge: 2023-03-18 | Disposition: A | Discharge status: Home / Self Care | Payer: Medicare HMO | Attending: Family | Admitting: Family

## 2023-03-18 DIAGNOSIS — I5032 Chronic diastolic (congestive) heart failure: Secondary | ICD-10-CM | POA: Insufficient documentation

## 2023-03-18 LAB — BASIC METABOLIC PANEL
Anion gap: 7 (ref 5–15)
BUN: 29 mg/dL — ABNORMAL HIGH (ref 8–23)
CO2: 27 mmol/L (ref 22–32)
Calcium: 9.3 mg/dL (ref 8.9–10.3)
Chloride: 104 mmol/L (ref 98–111)
Creatinine, Ser: 1.25 mg/dL — ABNORMAL HIGH (ref 0.44–1.00)
GFR, Estimated: 47 mL/min — ABNORMAL LOW (ref >60)
Glucose, Bld: 182 mg/dL — ABNORMAL HIGH (ref 70–99)
Potassium: 4.3 mmol/L (ref 3.5–5.1)
Sodium: 138 mmol/L (ref 135–145)

## 2023-03-19 ENCOUNTER — Other Ambulatory Visit: Payer: Self-pay | Admitting: Nurse Practitioner

## 2023-03-19 NOTE — Telephone Encounter (Signed)
Requested Prescriptions  Pending Prescriptions Disp Refills   furosemide (LASIX) 20 MG tablet [Pharmacy Med Name: furosemide 20 mg tablet] 90 tablet 1    Sig: TAKE ONE TABLET BY MOUTH ONCE DAILY     Cardiovascular:  Diuretics - Loop Failed - 03/19/2023  8:04 AM      Failed - Cr in normal range and within 180 days    Creatinine, Ser  Date Value Ref Range Status  03/18/2023 1.25 (H) 0.44 - 1.00 mg/dL Final         Failed - Mg Level in normal range and within 180 days    Magnesium  Date Value Ref Range Status  01/08/2022 1.7 1.7 - 2.4 mg/dL Final    Comment:    Performed at [facility], [address] [place], [place] [address]         Failed - Last BP in normal range    BP Readings from Last 1 Encounters:  03/07/23 (!) 150/67         Passed - K in normal range and within 180 days    Potassium  Date Value Ref Range Status  03/18/2023 4.3 3.5 - 5.1 mmol/L Final         Passed - Ca in normal range and within 180 days    Calcium  Date Value Ref Range Status  03/18/2023 9.3 8.9 - 10.3 mg/dL Final         Passed - Na in normal range and within 180 days    Sodium  Date Value Ref Range Status  03/18/2023 138 135 - 145 mmol/L Final  01/01/2023 141 134 - 144 mmol/L Final         Passed - Cl in normal range and within 180 days    Chloride  Date Value Ref Range Status  03/18/2023 104 98 - 111 mmol/L Final         Passed - Valid encounter within last 6 months    Recent Outpatient Visits           2 months ago Hypertension associated with diabetes [facility])   [place] [facility] Larae Grooms, NP   5 months ago AF (paroxysmal atrial fibrillation) [facility])   [place] [facility] Larae Grooms, NP   8 months ago Acute on chronic diastolic CHF (congestive heart failure) [facility])   [place] [facility] Larae Grooms, NP   12 months ago Hypertension associated with diabetes (HCC)   [place] [facility] Vigg, [name], MD   1 year ago Cholecystitis   [place] [name], [name], MD       Future Appointments             In 2 weeks Larae Grooms, NP  [name]             rosuvastatin (CRESTOR) 5 MG tablet [Pharmacy Med Name: rosuvastatin 5 mg tablet] 90 tablet 1    Sig: TAKE ONE TABLET BY MOUTH ONCE DAILY     Cardiovascular:  Antilipid - Statins 2 Failed - 03/19/2023  8:04 AM      Failed - Cr in normal range and within 360 days    Creatinine, Ser  Date Value Ref Range Status  03/18/2023 1.25 (H) 0.44 - 1.00 mg/dL Final         Failed - Lipid Panel in normal range within the last 12 months    Cholesterol, Total  Date  Value Ref Range Status  10/02/2022 219 (H) 100 - 199 mg/dL Final   Cholesterol [place]  Date Value Ref Range Status  07/16/2017 196 <200 mg/dL Final    Comment:                            Desirable                <200                         Borderline High      200- 239                         High                     >239    LDL Chol Calc (NIH)  Date Value Ref Range Status  10/02/2022 131 (H) 0 - 99 mg/dL Final   HDL  Date Value Ref Range Status  10/02/2022 60 >39 mg/dL Final   Triglycerides  Date Value Ref Range Status  10/02/2022 156 (H) 0 - 149 mg/dL Final   Triglycerides Piccolo,Waived  Date Value Ref Range Status  07/16/2017 301 (H) <150 mg/dL Final    Comment:                            Normal                   <150                         Borderline High     150 - 199                         High                200 - 499                         Very High                >499          Passed - Patient is not pregnant      Passed - Valid encounter within last 12 months    Recent Outpatient Visits           2 months ago Hypertension associated with diabetes [facility])   [place] [facility] Larae Grooms, NP   5 months ago AF (paroxysmal  atrial fibrillation) [facility])   [place] [facility] Larae Grooms, NP   8 months ago Acute on chronic diastolic CHF (congestive heart failure) [facility])   [place] [facility] Larae Grooms, NP   12 months ago Hypertension associated with diabetes [facility])   [place] [name], [name], MD   1 year ago Cholecystitis   [place] [facility] Loura Pardon, MD       Future Appointments             In 2 weeks Larae Grooms, NP [place] [facility], [name]

## 2023-03-23 ENCOUNTER — Other Ambulatory Visit: Payer: Self-pay | Admitting: Nurse Practitioner

## 2023-03-24 NOTE — Telephone Encounter (Signed)
Requested Prescriptions  Pending Prescriptions Disp Refills   OZEMPIC, 1 MG/DOSE, 4 MG/3ML SOPN [Pharmacy Med Name: Ozempic (1 MG/DOSE) 4 MG/3ML Subcutaneous Solution Pen-injector] 3 mL 0    Sig: INJECT 1MG  SUBCUTANEOUSLY ONCE WEEKLY     Endocrinology:  Diabetes - GLP-1 Receptor Agonists - semaglutide Failed - 03/23/2023 11:16 AM      Failed - HBA1C in normal range and within 180 days    Hemoglobin A1C  Date Value Ref Range Status  06/12/2016 7.8  Final   HB A1C (BAYER DCA - WAIVED)  Date Value Ref Range Status  01/22/2022 6.9 (H) 4.8 - 5.6 % Final    Comment:             Prediabetes: 5.7 - 6.4          Diabetes: >6.4          Glycemic control for adults with diabetes: <7.0    Hgb A1c MFr Bld  Date Value Ref Range Status  01/01/2023 6.6 (H) 4.8 - 5.6 % Final    Comment:             Prediabetes: 5.7 - 6.4          Diabetes: >6.4          Glycemic control for adults with diabetes: <7.0          Failed - Cr in normal range and within 360 days    Creatinine, Ser  Date Value Ref Range Status  03/18/2023 1.25 (H) 0.44 - 1.00 mg/dL Final         Passed - Valid encounter within last 6 months    Recent Outpatient Visits           2 months ago Hypertension associated with diabetes [facility])   [place] [facility] Larae Grooms, NP   5 months ago AF (paroxysmal atrial fibrillation) [facility])   [place] [facility] Larae Grooms, NP   8 months ago Acute on chronic diastolic CHF (congestive heart failure) (HCC)   [place] [facility] Larae Grooms, NP   1 year ago Hypertension associated with diabetes (HCC)   [place] [name], [name], MD   1 year ago Cholecystitis   [place] [name], Roma Schanz, MD       Future Appointments             In 1 week Larae Grooms, NP [place] [facility], [name]

## 2023-03-25 ENCOUNTER — Telehealth: Payer: Self-pay

## 2023-03-25 NOTE — Progress Notes (Unsigned)
Care Management & Coordination Services Pharmacy Team  Reason for Encounter: Diabetes  Contacted patient to discuss diabetes disease state. {US HC Outreach:28874}   Recent office visits:  01/01/23-Karen Caren Griffins, NP (PCP) Seen for general follow up visit. Labs ordered. Follow up In 3 months.   Recent consult visits:  03/07/23-Tina A. Bing Neighbors, FNP (Cardiology) Seen for congestive heart failure. Start on spironolactone 12.5 mg daily. EKG completed. Follow up in 1 month.  01/29/23-Tina A. Bing Neighbors, FNP (Cardiology) Seen for congestive heart failure. Increase Entresto to 97-103 mg BID she can finish her current bottle by taking 2 tablets BID unit gone then begin the 97-103 mg. Follow up in 1 month.   Hospital visits:  None in previous 6 months  Medications: Outpatient Encounter Medications as of 03/25/2023  Medication Sig   albuterol (VENTOLIN HFA) 108 (90 Base) MCG/ACT inhaler Inhale 2 puffs into the lungs every 6 (six) hours as needed for wheezing or shortness of breath.   Calcium Carb-Cholecalciferol (CALCIUM 600+D3) 600-200 MG-UNIT TABS Take 1 tablet by mouth in the morning.   carvedilol (COREG) 12.5 MG tablet Take 1 tablet (12.5 mg total) by mouth 2 (two) times daily with a meal.   empagliflozin (JARDIANCE) 10 MG TABS tablet Take 1 tablet (10 mg total) by mouth daily before breakfast.   ferrous sulfate 325 (65 FE) MG tablet Take 1 tablet (325 mg total) by mouth daily.   furosemide (LASIX) 20 MG tablet TAKE ONE TABLET BY MOUTH ONCE DAILY   Multiple Vitamin (MULTIVITAMIN) tablet Take 1 tablet by mouth daily.   omeprazole (PRILOSEC) 20 MG capsule Take 20 mg by mouth daily as needed.   potassium chloride SA (KLOR-CON M) 20 MEQ tablet TAKE ONE TABLET BY MOUTH EVERY MORNING   rosuvastatin (CRESTOR) 5 MG tablet TAKE ONE TABLET BY MOUTH ONCE DAILY   sacubitril-valsartan (ENTRESTO) 97-103 MG Take 1 tablet by mouth 2 (two) times daily.   Semaglutide, 1 MG/DOSE, (OZEMPIC, 1 MG/DOSE,) 4 MG/3ML  SOPN INJECT 1MG  SUBCUTANEOUSLY ONCE WEEKLY   spironolactone (ALDACTONE) 25 MG tablet Take 0.5 tablets (12.5 mg total) by mouth daily.   vitamin B-12 (CYANOCOBALAMIN) 500 MCG tablet Take 500 mcg by mouth daily.   No facility-administered encounter medications on file as of 03/25/2023.    Recent Relevant Labs: Lab Results  Component Value Date/Time   HGBA1C 6.6 (H) 01/01/2023 09:26 AM   HGBA1C 6.6 (H) 10/02/2022 10:01 AM   HGBA1C 6.9 (H) 01/22/2022 03:20 PM   HGBA1C 7.0 (H) 11/22/2021 03:42 PM   HGBA1C 7.8 06/12/2016 12:00 AM   MICROALBUR 150 (H) 10/02/2022 09:59 AM   MICROALBUR 30 (H) 12/25/2020 03:03 PM    Kidney Function Lab Results  Component Value Date/Time   CREATININE 1.25 (H) 03/18/2023 09:17 AM   CREATININE 1.14 (H) 01/01/2023 09:26 AM   GFRNONAA 47 (L) 03/18/2023 09:17 AM   GFRAA 59 (L) 09/05/2020 04:15 PM   Current antihyperglycemic regimen:  Jardiance 10 mg take 1 tablet daily Ozempic 1 mg inject 1 mg weekly   Patient verbally confirms she is taking the above medications as directed. {yes/no:20286}  What diet changes have been made to improve diabetes control?  What recent interventions/DTPs have been made to improve glycemic control:    Have there been any recent hospitalizations or ED visits since last visit with PharmD? No  Patient {reports/denies:24182} hypoglycemic symptoms, including {Hypoglycemic Symptoms:3049003}  Patient {reports/denies:24182} hyperglycemic symptoms, including {symptoms; hyperglycemia:17903}  How often are you checking your blood sugar? {BG Testing frequency:23922}  What are  your blood sugars ranging?  Fasting:  Before meals:  After meals:  Bedtime:   During the week, how often does your blood glucose drop below 70? {LowBGfrequency:24142}  Are you checking your feet daily/regularly? {yes/no:20286}  Adherence Review: Is the patient currently on a STATIN medication? Yes Is the patient currently on ACE/ARB medication? Yes Does  the patient have >5 day gap between last estimated fill dates? No  Star Rating Drugs:  Ozempic 1 mg Last filled:01/31/23 28 DS, 02/25/23 28 DS Entresto 97-103 mg Last filled:02/04/23 30 DS, 03/04/23 30 DS Rosuvastatin 5 mg Last filled:01/27/23 30 DS, 02/27/23 30 DS Jardiance 10 mg Last filled:01/27/23 30 DS, 02/27/23 30 DS   Care Gaps: Annual wellness visit in last year? Yes Last eye exam / retinopathy screening:10/01/22 Last diabetic foot exam:01/01/23   Rance Muir, RMA

## 2023-04-03 ENCOUNTER — Encounter: Payer: Self-pay | Admitting: Nurse Practitioner

## 2023-04-03 ENCOUNTER — Ambulatory Visit (INDEPENDENT_AMBULATORY_CARE_PROVIDER_SITE_OTHER): Payer: Medicare HMO | Admitting: Nurse Practitioner

## 2023-04-03 VITALS — BP 130/80 | HR 67 | Temp 98.7°F | Wt 182.8 lb

## 2023-04-03 DIAGNOSIS — E6609 Other obesity due to excess calories: Secondary | ICD-10-CM

## 2023-04-03 DIAGNOSIS — N1831 Chronic kidney disease, stage 3a: Secondary | ICD-10-CM

## 2023-04-03 DIAGNOSIS — I480 Paroxysmal atrial fibrillation: Secondary | ICD-10-CM

## 2023-04-03 DIAGNOSIS — E538 Deficiency of other specified B group vitamins: Secondary | ICD-10-CM | POA: Diagnosis not present

## 2023-04-03 DIAGNOSIS — D61818 Other pancytopenia: Secondary | ICD-10-CM | POA: Diagnosis not present

## 2023-04-03 DIAGNOSIS — E785 Hyperlipidemia, unspecified: Secondary | ICD-10-CM | POA: Diagnosis not present

## 2023-04-03 DIAGNOSIS — E1159 Type 2 diabetes mellitus with other circulatory complications: Secondary | ICD-10-CM

## 2023-04-03 DIAGNOSIS — E1169 Type 2 diabetes mellitus with other specified complication: Secondary | ICD-10-CM | POA: Diagnosis not present

## 2023-04-03 DIAGNOSIS — E1165 Type 2 diabetes mellitus with hyperglycemia: Secondary | ICD-10-CM | POA: Diagnosis not present

## 2023-04-03 DIAGNOSIS — I5032 Chronic diastolic (congestive) heart failure: Secondary | ICD-10-CM | POA: Diagnosis not present

## 2023-04-03 DIAGNOSIS — I152 Hypertension secondary to endocrine disorders: Secondary | ICD-10-CM | POA: Diagnosis not present

## 2023-04-03 DIAGNOSIS — D649 Anemia, unspecified: Secondary | ICD-10-CM | POA: Diagnosis not present

## 2023-04-03 DIAGNOSIS — E66811 Obesity, class 1: Secondary | ICD-10-CM

## 2023-04-03 NOTE — Assessment & Plan Note (Signed)
Chronic.  Controlled.  Continue with current medication regimen.  Followed by the HF clinic.  Following up with Cardiology Annually.  Labs ordered today.  Return to clinic in 34months for reevaluation.  Call sooner if concerns arise.   - Reminded to call for an overnight weight gain of >2 pounds or a weekly weight gain of >5 pounds - not adding salt to food and read food labels. Reviewed the importance of keeping daily sodium intake to 2000mg  daily. - Avoid Ibuprofen products.

## 2023-04-03 NOTE — Assessment & Plan Note (Addendum)
Chronic. Well controlled. Pt tolerating ozempic. Denies any adverse side effects at this time.  A1c well controlled at 6.6%.   Continue with current medication regimen of Ozempic.  Follow up in 4 months.  Call sooner if concerns arise.

## 2023-04-03 NOTE — Assessment & Plan Note (Signed)
Recommended eating smaller high protein, low fat meals more frequently and exercising 30 mins a day 5 times a week with a goal of 10-15lb weight loss in the next 3 months.  

## 2023-04-03 NOTE — Assessment & Plan Note (Signed)
Chronic.  Controlled.  Continue with current medication regimen.  Labs ordered today.  Return to clinic in 4 months for reevaluation.  Call sooner if concerns arise.

## 2023-04-03 NOTE — Assessment & Plan Note (Signed)
Labs ordered at visit today.  Will make recommendations based on lab results.   

## 2023-04-03 NOTE — Assessment & Plan Note (Addendum)
Labs ordered at visit today.  Will make recommendations based on lab results. Continue with Jardiance.

## 2023-04-03 NOTE — Assessment & Plan Note (Signed)
Pt in normal rate/rhythm in office. Patient states she was taken off Amiodarone and Eliquis by Dr. Gwen Pounds.  Will continue to monitor at future visits.  Follow up in 4 months.  Call sooner if concerns arise.

## 2023-04-03 NOTE — Assessment & Plan Note (Signed)
Chronic. Lipid panel drawn in clinic today.  Continue with Crestor 5mg  daily.  Side effects and benefits of medication discussed during visit.  Will increase as patient tolerates the medication.  Follow up in 4 months.  Call sooner if concerns arise.

## 2023-04-03 NOTE — Assessment & Plan Note (Addendum)
Labs ordered at visit today.  Will make recommendations based on lab results.   

## 2023-04-03 NOTE — Assessment & Plan Note (Signed)
Chronic.  Controlled.  Blood pressures at home running 120-130/80.   No changes made to current regimen. Labs ordered today. Will make recommendations based on lab results. Follow up in 4 months.  Call sooner if concerns arise.

## 2023-04-03 NOTE — Progress Notes (Signed)
BP 130/80   Pulse 67   Temp 98.7 F (37.1 C) (Oral)   Wt 182 lb 12.8 oz (82.9 kg)   SpO2 97%   BMI 34.54 kg/m    Subjective:    Patient ID: Amanda [name], female    DOB: Jun 06, 1956, 67 y.o.   MRN: [id number]  HPI: Amanda [name] is a 67 y.o. female  Chief Complaint  Patient presents with   Chronic Kidney Disease   Congestive Heart Failure   Diabetes   Hyperlipidemia   Hypertension   HYPERTENSION / HYPERLIPIDEMIA/HF Patient is weighing several times a week.   Satisfied with current treatment? yes Duration of hypertension: chronic BP monitoring frequency: a few times a week BP range: 116-130-80 BP medication side effects: no Past BP meds: carvedilol, lasix, entresto Duration of hyperlipidemia: chronic Cholesterol medication side effects: no Cholesterol supplements: none Past cholesterol medications: Crestor Medication compliance: Compliant Aspirin: no Recent stressors: no Recurrent headaches: no Visual changes: no Palpitations: no Dyspnea: no Chest pain: no Lower extremity edema: no Dizzy/lightheaded: no   DIABETES Hypoglycemic episodes:no Polydipsia/polyuria: no Visual disturbance: no Chest pain: no Paresthesias: no Glucose Monitoring: no  Accucheck frequency: not checking  Fasting glucose:   Post prandial:  Evening:  Before meals: Taking Insulin?: no  Long acting insulin:  Short acting insulin: Blood Pressure Monitoring: a few times a week Retinal Examination: Up to date;  Foot Exam: Up to Date;  Diabetic Education: Completed Pneumovax:  not yet Influenza: Not up to Date Aspirin: no  CHRONIC KIDNEY DISEASE CKD status: stable Medications renally dose: yes Previous renal evaluation: yes; Pneumovax:  Not up to Date Influenza Vaccine:  Not up to Date  ANEMIA Anemia status: stable Etiology of anemia: Duration of anemia treatment:  Compliance with treatment: excellent compliance Iron supplementation side effects: no Severity of anemia:  mild Fatigue: yes Decreased exercise tolerance: no  Dyspnea on exertion: no Palpitations: no Bleeding: no Pica: no   Relevant past medical, surgical, family and social history reviewed and updated as indicated. Interim medical history since our last visit reviewed. Allergies and medications reviewed and updated.  Review of Systems  Constitutional:  Negative for activity change, appetite change, chills, diaphoresis, fatigue, fever and unexpected weight change.  HENT:  Negative for congestion.   Respiratory:  Negative for cough, chest tightness and shortness of breath.   Cardiovascular:  Negative for chest pain, palpitations and leg swelling.  Gastrointestinal:  Negative for abdominal distention, abdominal pain, constipation, diarrhea, nausea and vomiting.  Endocrine: Positive for polyuria (lasix, jardiance). Negative for polydipsia and polyphagia.  Genitourinary:  Negative for difficulty urinating, dysuria, flank pain and frequency.  Musculoskeletal:  Negative for arthralgias.  Neurological:  Negative for dizziness, syncope, weakness and headaches.    Per HPI unless specifically indicated above     Objective:    BP 130/80   Pulse 67   Temp 98.7 F (37.1 C) (Oral)   Wt 182 lb 12.8 oz (82.9 kg)   SpO2 97%   BMI 34.54 kg/m   Wt Readings from Last 3 Encounters:  04/03/23 182 lb 12.8 oz (82.9 kg)  03/07/23 183 lb 3.2 oz (83.1 kg)  01/29/23 179 lb 6.4 oz (81.4 kg)    Physical Exam Constitutional:      Appearance: Normal appearance. She is obese.  HENT:     Head: Normocephalic.     Nose: Nose normal. No congestion.     Mouth/Throat:     Mouth: Mucous membranes are moist.  Eyes:  Pupils: Pupils are equal, round, and reactive to light.  Cardiovascular:     Rate and Rhythm: Normal rate and regular rhythm.     Pulses: Normal pulses.     Heart sounds: Normal heart sounds. No murmur heard.    No gallop.  Pulmonary:     Effort: Pulmonary effort is normal. No respiratory  distress.     Breath sounds: Normal breath sounds. No stridor. No wheezing or rhonchi.  Chest:     Chest wall: No tenderness.  Abdominal:     General: Bowel sounds are normal.     Palpations: Abdomen is soft.     Tenderness: There is no abdominal tenderness.  Musculoskeletal:     Cervical back: Normal range of motion.     Right lower leg: No edema (Non-pitting).     Left lower leg: No edema (Non-pitting).  Skin:    General: Skin is warm and dry.     Capillary Refill: Capillary refill takes 2 to 3 seconds.  Neurological:     General: No focal deficit present.     Mental Status: She is alert and oriented to person, place, and time. Mental status is at baseline.  Psychiatric:        Mood and Affect: Mood normal.        Behavior: Behavior normal.        Thought Content: Thought content normal.        Judgment: Judgment normal.    Results for orders placed or performed in visit on 04/03/23  HM DIABETES EYE EXAM  Result Value Ref Range   HM Diabetic Eye Exam No Retinopathy No Retinopathy      Assessment & Plan:   Problem List Items Addressed This Visit       Cardiovascular and Mediastinum   Hypertension associated with diabetes (HCC) - Primary    Chronic.  Controlled.  Blood pressures at home running 120-130/80.   No changes made to current regimen. Labs ordered today. Will make recommendations based on lab results. Follow up in 4 months.  Call sooner if concerns arise.       Relevant Orders   Comp Met (CMET)   AF (paroxysmal atrial fibrillation) (HCC)    Pt in normal rate/rhythm in office. Patient states she was taken off Amiodarone and Eliquis by Dr. Gwen Pounds.  Will continue to monitor at future visits.  Follow up in 4 months.  Call sooner if concerns arise.       Chronic diastolic CHF (congestive heart failure), NYHA class 2 (HCC)    Chronic.  Controlled.  Continue with current medication regimen.  Followed by the HF clinic.  Following up with Cardiology Annually.  Labs  ordered today.  Return to clinic in 34months for reevaluation.  Call sooner if concerns arise.   - Reminded to call for an overnight weight gain of >2 pounds or a weekly weight gain of >5 pounds - not adding salt to food and read food labels. Reviewed the importance of keeping daily sodium intake to 2000mg  daily. - Avoid Ibuprofen products.         Endocrine   Hyperlipidemia associated with type 2 diabetes mellitus (HCC)    Chronic. Lipid panel drawn in clinic today.  Continue with Crestor 5mg  daily.  Side effects and benefits of medication discussed during visit.  Will increase as patient tolerates the medication.  Follow up in 4 months.  Call sooner if concerns arise.       Uncontrolled type 2  diabetes mellitus with hyperglycemia, without long-term current use of insulin (HCC)    Chronic. Well controlled. Pt tolerating ozempic. Denies any adverse side effects at this time.  A1c well controlled at 6.6%.   Continue with current medication regimen of Ozempic.  Follow up in 4 months.  Call sooner if concerns arise.       Relevant Orders   HgB A1c     Genitourinary   CKD (chronic kidney disease) stage 3, GFR 30-59 ml/min (HCC)    Labs ordered at visit today.  Will make recommendations based on lab results. Continue with Jardiance.          Hematopoietic and Hemostatic   Pancytopenia (HCC)    Chronic.  Controlled.  Continue with current medication regimen.  Labs ordered today.  Return to clinic in 4 months for reevaluation.  Call sooner if concerns arise.        Relevant Orders   CBC w/Diff     Other   Obesity    Recommended eating smaller high protein, low fat meals more frequently and exercising 30 mins a day 5 times a week with a goal of 10-15lb weight loss in the next 3 months.       B12 deficiency    Labs ordered at visit today.  Will make recommendations based on lab results.        Relevant Orders   B12   Anemia    Labs ordered at visit today.  Will make  recommendations based on lab results.          Follow up plan: Return in about 4 months (around 08/03/2023) for HTN, HLD, DM2 FU.

## 2023-04-04 LAB — CBC WITH DIFFERENTIAL/PLATELET
Basophils Absolute: 0 10*3/uL (ref 0.0–0.2)
Basos: 0 %
EOS (ABSOLUTE): 0.4 10*3/uL (ref 0.0–0.4)
Eos: 7 %
Hematocrit: 37.4 % (ref 34.0–46.6)
Hemoglobin: 12.4 g/dL (ref 11.1–15.9)
Immature Grans (Abs): 0 10*3/uL (ref 0.0–0.1)
Immature Granulocytes: 0 %
Lymphocytes Absolute: 2.1 10*3/uL (ref 0.7–3.1)
Lymphs: 40 %
MCH: 31.6 pg (ref 26.6–33.0)
MCHC: 33.2 g/dL (ref 31.5–35.7)
MCV: 95 fL (ref 79–97)
Monocytes Absolute: 0.4 10*3/uL (ref 0.1–0.9)
Monocytes: 8 %
Neutrophils Absolute: 2.4 10*3/uL (ref 1.4–7.0)
Neutrophils: 45 %
Platelets: 159 10*3/uL (ref 150–450)
RBC: 3.92 x10E6/uL (ref 3.77–5.28)
RDW: 12.5 % (ref 11.7–15.4)
WBC: 5.2 10*3/uL (ref 3.4–10.8)

## 2023-04-04 LAB — COMPREHENSIVE METABOLIC PANEL
ALT: 16 IU/L (ref 0–32)
AST: 24 IU/L (ref 0–40)
Albumin/Globulin Ratio: 1.6
Albumin: 4.3 g/dL (ref 3.9–4.9)
Alkaline Phosphatase: 71 IU/L (ref 44–121)
BUN/Creatinine Ratio: 17 (ref 12–28)
BUN: 23 mg/dL (ref 8–27)
Bilirubin Total: 0.4 mg/dL (ref 0.0–1.2)
CO2: 25 mmol/L (ref 20–29)
Calcium: 9.5 mg/dL (ref 8.7–10.3)
Chloride: 103 mmol/L (ref 96–106)
Creatinine, Ser: 1.32 mg/dL — ABNORMAL HIGH (ref 0.57–1.00)
Globulin, Total: 2.7 g/dL (ref 1.5–4.5)
Glucose: 175 mg/dL — ABNORMAL HIGH (ref 70–99)
Potassium: 4.8 mmol/L (ref 3.5–5.2)
Sodium: 140 mmol/L (ref 134–144)
Total Protein: 7 g/dL (ref 6.0–8.5)
eGFR: 44 mL/min/{1.73_m2} — ABNORMAL LOW (ref >59)

## 2023-04-04 LAB — HEMOGLOBIN A1C
Est. average glucose Bld gHb Est-mCnc: 166 mg/dL
Hgb A1c MFr Bld: 7.4 % — ABNORMAL HIGH (ref 4.8–5.6)

## 2023-04-04 LAB — VITAMIN B12: Vitamin B-12: >2000 pg/mL — ABNORMAL HIGH (ref 232–1245)

## 2023-04-04 MED ORDER — SEMAGLUTIDE (2 MG/DOSE) 8 MG/3ML ~~LOC~~ SOPN: 2.0000 mg | PEN_INJECTOR | SUBCUTANEOUS | 3 refills | Status: DC

## 2023-04-04 NOTE — Progress Notes (Signed)
Hi Amanda [name]. It was nice to see you yesterday.  Your A1c did increase to 7.4%.  I recommend we increase your Ozempic to 2mg . Your kidney function remains stable.   Your lab work looks good.  No other concerns at this time. Continue with your current medication regimen.  Follow up as discussed.  Please let me know if you have any questions.

## 2023-04-04 NOTE — Addendum Note (Signed)
Addended by: Larae Grooms on: 04/04/2023 08:08 AM   Modules accepted: Orders

## 2023-04-13 NOTE — Progress Notes (Unsigned)
PCP: Larae Grooms, NP (last seen 06/24) Primary Cardiologist: Arnoldo Hooker, MD (last seen 10/23; returns 10/24)  HPI:  Amanda [name] is a 67 y/o female with a history of DM, hyperlipidemia, HTN, anxiety, atrial fibrillation, GERD, previous tobacco use and chronic heart failure.   Echo 01/05/22: EF of 50-55% along with moderate LAE & moderate MR.   Has not been admitted or been in the ED in the last 6 months.   She presents today for a HF f/u visit with a chief complaint of minimal fatigue with moderate exertion. Chronic in nature. Reports sleeping better. Denies SOB, chest pain, palpitations, pedal edema, abdominal distention, dizziness or weight gain.    At last visit, spironolactone 12.5mg  daily was started. Denies having any issues with taking this. Reports tolerating increased ozempic dose from her PCP.   Labs have been checked since spironolactone was started and did show a worsening of her renal function. Plan to recheck this today.   ROS: All systems negative except as listed in HPI, PMH and Problem List.  SH:  Social History   Socioeconomic History   Marital status: Single    Spouse name: [name]   Number of children: Not on file   Years of education: Not on file   Highest education level: Not on file  Occupational History   Not on file  Tobacco Use   Smoking status: Former    Types: Cigarettes    Quit date: 12/07/1975    Years since quitting: 47.3   Smokeless tobacco: Never  Vaping Use   Vaping Use: Never used  Substance and Sexual Activity   Alcohol use: No   Drug use: No   Sexual activity: Not on file  Other Topics Concern   Not on file  Social History Narrative   Not on file   Social Determinants of Health   Financial Resource Strain: High Risk (12/23/2022)   Overall Financial Resource Strain (CARDIA)    Difficulty of Paying Living Expenses: Hard  Food Insecurity: No Food Insecurity (12/10/2022)   Hunger Vital Sign    Worried About Running Out of Food in  the Last Year: Never true    Ran Out of Food in the Last Year: Never true  Transportation Needs: No Transportation Needs (12/10/2022)   PRAPARE - Administrator, Civil Service (Medical): No    Lack of Transportation (Non-Medical): No  Physical Activity: Insufficiently Active (12/10/2022)   Exercise Vital Sign    Days of Exercise per Week: 3 days    Minutes of Exercise per Session: 30 min  Stress: No Stress Concern Present (12/10/2022)   [employer] of Occupational Health - Occupational Stress Questionnaire    Feeling of Stress : Not at all  Social Connections: Socially Isolated (12/10/2022)   Social Connection and Isolation Panel [NHANES]    Frequency of Communication with Friends and Family: More than three times a week    Frequency of Social Gatherings with Friends and Family: Twice a week    Attends Religious Services: Never    Database administrator or Organizations: No    Attends Banker Meetings: Never    Marital Status: Never married  Intimate Partner Violence: Not At Risk (12/10/2022)   Humiliation, Afraid, Rape, and Kick questionnaire    Fear of Current or Ex-Partner: No    Emotionally Abused: No    Physically Abused: No    Sexually Abused: No    FH:  Family History  Problem Relation Age  of Onset   COPD Mother    Hypertension Mother    Diabetes Mother    Severe combined immunodeficiency Father    Diabetes Sister    Heart disease Sister    Heart disease Sister    Cancer Niece     Past Medical History:  Diagnosis Date   Anxiety    Arthritis    Atrial fibrillation (HCC)    CHF (congestive heart failure) (HCC)    Cholecystitis    Cholecystitis    Diabetes mellitus without complication (HCC)    Dyspnea    GERD (gastroesophageal reflux disease)    Hyperlipidemia    Hypertension     Current Outpatient Medications  Medication Sig Dispense Refill   albuterol (VENTOLIN HFA) 108 (90 Base) MCG/ACT inhaler Inhale 2 puffs into the lungs  every 6 (six) hours as needed for wheezing or shortness of breath. 8 g 0   Calcium Carb-Cholecalciferol (CALCIUM 600+D3) 600-200 MG-UNIT TABS Take 1 tablet by mouth in the morning.     carvedilol (COREG) 12.5 MG tablet Take 1 tablet (12.5 mg total) by mouth 2 (two) times daily with a meal. 180 tablet 4   empagliflozin (JARDIANCE) 10 MG TABS tablet Take 1 tablet (10 mg total) by mouth daily before breakfast. 90 tablet 4   ferrous sulfate 325 (65 FE) MG tablet Take 1 tablet (325 mg total) by mouth daily. 30 tablet 3   furosemide (LASIX) 20 MG tablet TAKE ONE TABLET BY MOUTH ONCE DAILY 90 tablet 0   Multiple Vitamin (MULTIVITAMIN) tablet Take 1 tablet by mouth daily.     omeprazole (PRILOSEC) 20 MG capsule Take 20 mg by mouth daily as needed.     potassium chloride SA (KLOR-CON M) 20 MEQ tablet TAKE ONE TABLET BY MOUTH EVERY MORNING 30 tablet 3   rosuvastatin (CRESTOR) 5 MG tablet TAKE ONE TABLET BY MOUTH ONCE DAILY 90 tablet 0   sacubitril-valsartan (ENTRESTO) 97-103 MG Take 1 tablet by mouth 2 (two) times daily. 60 tablet 5   Semaglutide, 2 MG/DOSE, 8 MG/3ML SOPN Inject 2 mg as directed once a week. 3 mL 3   spironolactone (ALDACTONE) 25 MG tablet Take 0.5 tablets (12.5 mg total) by mouth daily. 90 tablet 3   vitamin B-12 (CYANOCOBALAMIN) 500 MCG tablet Take 500 mcg by mouth daily.     No current facility-administered medications for this visit.   Vitals:   04/14/23 0853  BP: (!) 116/42  Pulse: 72  SpO2: 100%  Weight: 180 lb 9.6 oz (81.9 kg)   Wt Readings from Last 3 Encounters:  04/14/23 180 lb 9.6 oz (81.9 kg)  04/03/23 182 lb 12.8 oz (82.9 kg)  03/07/23 183 lb 3.2 oz (83.1 kg)   Lab Results  Component Value Date   CREATININE 1.44 (H) 04/14/2023   CREATININE 1.32 (H) 04/03/2023   CREATININE 1.25 (H) 03/18/2023    PHYSICAL EXAM:  General:  Well appearing. No resp difficulty HEENT: normal Neck: supple. JVP flat. No lymphadenopathy or thryomegaly appreciated. Cor: PMI normal.  Regular rate & rhythm. No rubs, gallops or murmurs. Lungs: clear Abdomen: soft, nontender, nondistended. No hepatosplenomegaly. No bruits or masses.  Extremities: no cyanosis, clubbing, rash, edema Neuro: alert & orientedx3, cranial nerves grossly intact. Moves all 4 extremities w/o difficulty. Affect pleasant.   ECG: not done   ASSESSMENT & PLAN:  1: NICM with preserved ejection fraction- - likely HTN/ AF - NYHA class II - euvolemic - weighing daily; reminded to call for an overnight weight  gain of > 2 pounds or a weekly weight gain of > 5 pounds - weight down 3 pounds from last visit here 1 month ago - Echo 01/05/22: EF of 50-55% along with moderate LAE & moderate MR; plan to update this at her next visit - does add salt to her eggs but says that she's trying to not add any salt to anything else; trying to keep daily sodium intake to ~ 2000mg  / day - continue jardiance 10mg  daily - continue carvedilol 12.5mg  BID - continue entresto 97/103mg  BID - continue spironolactone 12.5mg  daily - continue furosemide 20mg  daily/ potassium daily - BNP 01/04/22 was 537.6  2: HTN- - BP 116/42 - saw PCP Caren Griffins) 06/24 - BMP 04/03/23 reviewed and showed sodium 140, potassium 4.8, creatinine 1.32 and GFR 44 - recheck BMP today  3: DM- - A1c 04/03/23 was 7.4% - PCP recently increased ozempic to 2mg  weekly due to increased A1c; patient doesn't report any issues with increased dose  4: Atrial fibrillation- - saw cardiology(Kowalski) 10/23; returns 10/24 - continue carvedilol 12.5mg  BID  Return in 6 months, sooner if needed.

## 2023-04-14 ENCOUNTER — Other Ambulatory Visit: Payer: Self-pay | Admitting: Family

## 2023-04-14 ENCOUNTER — Other Ambulatory Visit
Admission: RE | Admit: 2023-04-14 | Discharge: 2023-04-14 | Disposition: A | Discharge status: Home / Self Care | Payer: Medicare HMO | Source: Ambulatory Visit | Attending: Family | Admitting: Family

## 2023-04-14 ENCOUNTER — Encounter: Payer: Self-pay | Admitting: Family

## 2023-04-14 ENCOUNTER — Ambulatory Visit (HOSPITAL_BASED_OUTPATIENT_CLINIC_OR_DEPARTMENT_OTHER): Payer: Medicare HMO | Admitting: Family

## 2023-04-14 VITALS — BP 116/42 | HR 72 | Wt 180.6 lb

## 2023-04-14 DIAGNOSIS — I5032 Chronic diastolic (congestive) heart failure: Secondary | ICD-10-CM

## 2023-04-14 DIAGNOSIS — E119 Type 2 diabetes mellitus without complications: Secondary | ICD-10-CM | POA: Diagnosis not present

## 2023-04-14 DIAGNOSIS — I10 Essential (primary) hypertension: Secondary | ICD-10-CM

## 2023-04-14 DIAGNOSIS — I480 Paroxysmal atrial fibrillation: Secondary | ICD-10-CM | POA: Diagnosis not present

## 2023-04-14 LAB — BASIC METABOLIC PANEL
Anion gap: 10 (ref 5–15)
BUN: 34 mg/dL — ABNORMAL HIGH (ref 8–23)
CO2: 25 mmol/L (ref 22–32)
Calcium: 9.7 mg/dL (ref 8.9–10.3)
Chloride: 103 mmol/L (ref 98–111)
Creatinine, Ser: 1.44 mg/dL — ABNORMAL HIGH (ref 0.44–1.00)
GFR, Estimated: 40 mL/min — ABNORMAL LOW (ref >60)
Glucose, Bld: 139 mg/dL — ABNORMAL HIGH (ref 70–99)
Potassium: 4.2 mmol/L (ref 3.5–5.1)
Sodium: 138 mmol/L (ref 135–145)

## 2023-04-14 NOTE — Patient Instructions (Signed)
It was good to see you today. Be careful out in the hot/ humid weather.   If you receive a satisfaction survey regarding the Heart Failure Clinic, please take the time to fill it out. This way we can continue to provide excellent care and make any changes that need to be made.

## 2023-04-28 ENCOUNTER — Other Ambulatory Visit
Admission: RE | Admit: 2023-04-28 | Discharge: 2023-04-28 | Disposition: A | Discharge status: Home / Self Care | Payer: Medicare HMO | Attending: Family | Admitting: Family

## 2023-04-28 ENCOUNTER — Encounter: Payer: Self-pay | Admitting: Family

## 2023-04-28 DIAGNOSIS — I5032 Chronic diastolic (congestive) heart failure: Secondary | ICD-10-CM | POA: Insufficient documentation

## 2023-04-28 LAB — BASIC METABOLIC PANEL
Anion gap: 7 (ref 5–15)
BUN: 19 mg/dL (ref 8–23)
CO2: 26 mmol/L (ref 22–32)
Calcium: 9.3 mg/dL (ref 8.9–10.3)
Chloride: 105 mmol/L (ref 98–111)
Creatinine, Ser: 1.28 mg/dL — ABNORMAL HIGH (ref 0.44–1.00)
GFR, Estimated: 46 mL/min — ABNORMAL LOW (ref >60)
Glucose, Bld: 134 mg/dL — ABNORMAL HIGH (ref 70–99)
Potassium: 4.3 mmol/L (ref 3.5–5.1)
Sodium: 138 mmol/L (ref 135–145)

## 2023-06-13 ENCOUNTER — Other Ambulatory Visit: Payer: Self-pay | Admitting: Nurse Practitioner

## 2023-06-16 NOTE — Telephone Encounter (Signed)
Requested Prescriptions  Pending Prescriptions Disp Refills   furosemide (LASIX) 20 MG tablet [Pharmacy Med Name: FUROSEMIDE 20 MG TABS 20 Tablet] 90 tablet 0    Sig: TAKE 1 TABLET BY MOUTH ONCE DAILY *REFILL REQUEST*     Cardiovascular:  Diuretics - Loop Failed - 06/13/2023 12:37 PM      Failed - Cr in normal range and within 180 days    Creatinine, Ser  Date Value Ref Range Status  04/28/2023 1.28 (H) 0.44 - 1.00 mg/dL Final         Failed - Mg Level in normal range and within 180 days    Magnesium  Date Value Ref Range Status  01/08/2022 1.7 1.7 - 2.4 mg/dL Final    Comment:    Performed at [facility], [address] [place], [place] [address]         Failed - Last BP in normal range    BP Readings from Last 1 Encounters:  04/14/23 (!) 116/42         Passed - K in normal range and within 180 days    Potassium  Date Value Ref Range Status  04/28/2023 4.3 3.5 - 5.1 mmol/L Final         Passed - Ca in normal range and within 180 days    Calcium  Date Value Ref Range Status  04/28/2023 9.3 8.9 - 10.3 mg/dL Final         Passed - Na in normal range and within 180 days    Sodium  Date Value Ref Range Status  04/28/2023 138 135 - 145 mmol/L Final  04/03/2023 140 134 - 144 mmol/L Final         Passed - Cl in normal range and within 180 days    Chloride  Date Value Ref Range Status  04/28/2023 105 98 - 111 mmol/L Final         Passed - Valid encounter within last 6 months    Recent Outpatient Visits           2 months ago Hypertension associated with diabetes [facility])   [place] [facility]ae Grooms, NP   5 months ago Hypertension associated with diabetes [facility])   [place] [facility] Larae Grooms, NP   8 months ago AF (paroxysmal atrial fibrillation) [facility])   [place] [facility] Larae Grooms, NP   11 months ago Acute on chronic diastolic CHF (congestive heart failure) (HCC)   [place]  [facility] Larae Grooms, NP   1 year ago Hypertension associated with diabetes (HCC)   [place] [name], [name], MD       Future Appointments             In 1 month Larae Grooms, NP [place] [name]             rosuvastatin (CRESTOR) 5 MG tablet [Pharmacy Med Name: ROSUVASTATIN 5MG  TAB 5 Tablet] 90 tablet 0    Sig: TAKE 1 TABLET BY MOUTH ONCE DAILY *REFILL REQUEST*     Cardiovascular:  Antilipid - Statins 2 Failed - 06/13/2023 12:37 PM      Failed - Cr in normal range and within 360 days    Creatinine, Ser  Date Value Ref Range Status  04/28/2023 1.28 (H) 0.44 - 1.00 mg/dL Final         Failed - Lipid Panel in normal range within the last  12 months    Cholesterol, Total  Date Value Ref Range Status  10/02/2022 219 (H) 100 - 199 mg/dL Final   Cholesterol Piccolo, Waived  Date Value Ref Range Status  07/16/2017 196 <200 mg/dL Final    Comment:                            Desirable                <200                         Borderline High      200- 239                         High                     >239    LDL Chol Calc (NIH)  Date Value Ref Range Status  10/02/2022 131 (H) 0 - 99 mg/dL Final   HDL  Date Value Ref Range Status  10/02/2022 60 >39 mg/dL Final   Triglycerides  Date Value Ref Range Status  10/02/2022 156 (H) 0 - 149 mg/dL Final   Triglycerides Piccolo,Waived  Date Value Ref Range Status  07/16/2017 301 (H) <150 mg/dL Final    Comment:                            Normal                   <150                         Borderline High     150 - 199                         High                200 - 499                         Very High                >499          Passed - Patient is not pregnant      Passed - Valid encounter within last 12 months    Recent Outpatient Visits           2 months ago Hypertension associated with diabetes [facility])   [place]  [facility] Larae Grooms, NP   5 months ago Hypertension associated with diabetes [facility])   [place] [facility] Larae Grooms, NP   8 months ago AF (paroxysmal atrial fibrillation) [facility])   [place] [facility] Larae Grooms, NP   11 months ago Acute on chronic diastolic CHF (congestive heart failure) (HCC)   [place] [facility] Larae Grooms, NP   1 year ago Hypertension associated with diabetes [facility])   [place] [name], [name], MD       Future Appointments             In 1 month Larae Grooms, NP  [name]

## 2023-06-19 ENCOUNTER — Other Ambulatory Visit: Payer: Self-pay | Admitting: Nurse Practitioner

## 2023-06-20 ENCOUNTER — Other Ambulatory Visit: Payer: Self-pay | Admitting: Family

## 2023-06-20 NOTE — Telephone Encounter (Signed)
Requested medication (s) are due for refill today: {yes  Requested medication (s) are on the active medication list: yes  Last refill:  carvedilol: 06/09/22 #180 4 RF   jardiance: 06/19/22 #90 4 RF  Future visit scheduled: yes  Notes to clinic:  Cr out of normal range   Requested Prescriptions  Pending Prescriptions Disp Refills   carvedilol (COREG) 12.5 MG tablet [Pharmacy Med Name: CARVEDILOL 12.5 MG TABS 12.5 Tablet] 60 tablet 10    Sig: TAKE 1 TABLET BY MOUTH TWICE DAILY WITH A MEAL     Cardiovascular: Beta Blockers 3 Failed - 06/19/2023  7:27 PM      Failed - Cr in normal range and within 360 days    Creatinine, Ser  Date Value Ref Range Status  04/28/2023 1.28 (H) 0.44 - 1.00 mg/dL Final         Failed - Last BP in normal range    BP Readings from Last 1 Encounters:  04/14/23 (!) 116/42         Passed - AST in normal range and within 360 days    AST  Date Value Ref Range Status  04/03/2023 24 0 - 40 IU/L Final   AST (SGOT) Piccolo, Waived  Date Value Ref Range Status  07/16/2017 28 11 - 38 U/L Final         Passed - ALT in normal range and within 360 days    ALT  Date Value Ref Range Status  04/03/2023 16 0 - 32 IU/L Final   ALT (SGPT) Piccolo, Waived  Date Value Ref Range Status  07/16/2017 33 10 - 47 U/L Final         Passed - Last Heart Rate in normal range    Pulse Readings from Last 1 Encounters:  04/14/23 72         Passed - Valid encounter within last 6 months    Recent Outpatient Visits           2 months ago Hypertension associated with diabetes [facility])   [place] [facility] Larae Grooms, NP   5 months ago Hypertension associated with diabetes [facility])   [place] [facility] Larae Grooms, NP   8 months ago AF (paroxysmal atrial fibrillation) [facility])   [facility] Larae Grooms, NP   11 months ago Acute on chronic diastolic CHF (congestive heart failure) (HCC)   [place]  [facility] Larae Grooms, NP   1 year ago Hypertension associated with diabetes (HCC)   [place] [name], [name], MD       Future Appointments             In 1 month Larae Grooms, NP [place] [name]             JARDIANCE 10 MG TABS tablet [Pharmacy Med Name: JARDIANCE 10 MG TABLET 10 Tablet] 30 tablet 10    Sig: TAKE 1 TABLET BY MOUTH DAILY BEFORE BREAKFAST     Endocrinology:  Diabetes - SGLT2 Inhibitors Failed - 06/19/2023  7:27 PM      Failed - Cr in normal range and within 360 days    Creatinine, Ser  Date Value Ref Range Status  04/28/2023 1.28 (H) 0.44 - 1.00 mg/dL Final         Failed - eGFR in normal range and within 360 days    GFR calc Af Denyse Dago  Date Value Ref Range Status  09/05/2020 59 (L) >59 mL/min/1.73 Final    Comment:    **In accordance with recommendations from the NKF-ASN Task force,**   [facility] is in the process of updating its eGFR calculation to the   2021 CKD-EPI creatinine equation that estimates kidney function   without a race variable.    GFR, Estimated  Date Value Ref Range Status  04/28/2023 46 (L) >60 mL/min Final    Comment:    (NOTE) Calculated using the CKD-EPI Creatinine Equation (2021)    eGFR  Date Value Ref Range Status  04/03/2023 44 (L) >59 mL/min/1.73 Final         Passed - HBA1C is between 0 and 7.9 and within 180 days    Hemoglobin A1C  Date Value Ref Range Status  06/12/2016 7.8  Final   HB A1C (BAYER DCA - WAIVED)  Date Value Ref Range Status  01/22/2022 6.9 (H) 4.8 - 5.6 % Final    Comment:             Prediabetes: 5.7 - 6.4          Diabetes: >6.4          Glycemic control for adults with diabetes: <7.0    Hgb A1c MFr Bld  Date Value Ref Range Status  04/03/2023 7.4 (H) 4.8 - 5.6 % Final    Comment:             Prediabetes: 5.7 - 6.4          Diabetes: >6.4          Glycemic control for adults with diabetes: <7.0           Passed - Valid encounter within last 6 months    Recent Outpatient Visits           2 months ago Hypertension associated with diabetes [facility])   [place] [facility] Larae Grooms, NP   5 months ago Hypertension associated with diabetes [facility])   [place] [facility] Larae Grooms, NP   8 months ago AF (paroxysmal atrial fibrillation) [facility])   [facility] [facility] Larae Grooms, NP   11 months ago Acute on chronic diastolic CHF (congestive heart failure) (HCC)   [facility] Larae Grooms, NP   1 year ago Hypertension associated with diabetes [facility])   [place] [name], [name], MD       Future Appointments             In 1 month Larae Grooms, NP [place] [name]

## 2023-06-26 ENCOUNTER — Other Ambulatory Visit: Payer: Self-pay | Admitting: Family

## 2023-06-26 NOTE — Telephone Encounter (Signed)
duplicate

## 2023-06-28 ENCOUNTER — Emergency Department
Admission: EM | Admit: 2023-06-28 | Discharge: 2023-06-28 | Disposition: A | Discharge status: Home / Self Care | Payer: Medicare HMO | Attending: Student in an Organized Health Care Education/Training Program | Admitting: Student in an Organized Health Care Education/Training Program

## 2023-06-28 ENCOUNTER — Other Ambulatory Visit: Payer: Self-pay

## 2023-06-28 ENCOUNTER — Emergency Department: Payer: Medicare HMO

## 2023-06-28 DIAGNOSIS — R1084 Generalized abdominal pain: Secondary | ICD-10-CM | POA: Diagnosis not present

## 2023-06-28 DIAGNOSIS — R112 Nausea with vomiting, unspecified: Secondary | ICD-10-CM | POA: Diagnosis not present

## 2023-06-28 DIAGNOSIS — R1031 Right lower quadrant pain: Secondary | ICD-10-CM | POA: Diagnosis not present

## 2023-06-28 DIAGNOSIS — R109 Unspecified abdominal pain: Secondary | ICD-10-CM | POA: Diagnosis not present

## 2023-06-28 LAB — CBC
HCT: 37.7 % (ref 36.0–46.0)
Hemoglobin: 12.9 g/dL (ref 12.0–15.0)
MCH: 31.9 pg (ref 26.0–34.0)
MCHC: 34.2 g/dL (ref 30.0–36.0)
MCV: 93.3 fL (ref 80.0–100.0)
Platelets: 187 10*3/uL (ref 150–400)
RBC: 4.04 MIL/uL (ref 3.87–5.11)
RDW: 12.4 % (ref 11.5–15.5)
WBC: 7 10*3/uL (ref 4.0–10.5)
nRBC: 0 % (ref 0.0–0.2)

## 2023-06-28 LAB — URINALYSIS, ROUTINE W REFLEX MICROSCOPIC
Bilirubin Urine: NEGATIVE
Glucose, UA: >=500 mg/dL — AB
Hgb urine dipstick: NEGATIVE
Ketones, ur: 5 mg/dL — AB
Nitrite: NEGATIVE
Protein, ur: 30 mg/dL — AB
Specific Gravity, Urine: 1.021 (ref 1.005–1.030)
pH: 5 (ref 5.0–8.0)

## 2023-06-28 LAB — COMPREHENSIVE METABOLIC PANEL
ALT: 26 U/L (ref 0–44)
AST: 40 U/L (ref 15–41)
Albumin: 4.2 g/dL (ref 3.5–5.0)
Alkaline Phosphatase: 58 U/L (ref 38–126)
Anion gap: 11 (ref 5–15)
BUN: 24 mg/dL — ABNORMAL HIGH (ref 8–23)
CO2: 23 mmol/L (ref 22–32)
Calcium: 9.2 mg/dL (ref 8.9–10.3)
Chloride: 104 mmol/L (ref 98–111)
Creatinine, Ser: 1.15 mg/dL — ABNORMAL HIGH (ref 0.44–1.00)
GFR, Estimated: 52 mL/min — ABNORMAL LOW (ref >60)
Glucose, Bld: 120 mg/dL — ABNORMAL HIGH (ref 70–99)
Potassium: 4.4 mmol/L (ref 3.5–5.1)
Sodium: 138 mmol/L (ref 135–145)
Total Bilirubin: 0.9 mg/dL (ref 0.3–1.2)
Total Protein: 7.3 g/dL (ref 6.5–8.1)

## 2023-06-28 LAB — LIPASE, BLOOD: Lipase: 37 U/L (ref 11–51)

## 2023-06-28 MED ORDER — ONDANSETRON HCL 4 MG/2ML IJ SOLN
4.0000 mg | Freq: Once | INTRAMUSCULAR | Status: AC
  Administered 2023-06-28: 4 mg via INTRAVENOUS
  Filled 2023-06-28: qty 2

## 2023-06-28 MED ORDER — ONDANSETRON 4 MG PO TBDP: 4.0000 mg | ORAL_TABLET | Freq: Three times a day (TID) | ORAL | 0 refills | Status: DC | PRN

## 2023-06-28 MED ORDER — MORPHINE SULFATE (PF) 4 MG/ML IV SOLN
4.0000 mg | INTRAVENOUS | Status: DC | PRN
  Administered 2023-06-28: 4 mg via INTRAVENOUS
  Filled 2023-06-28: qty 1

## 2023-06-28 MED ORDER — IOHEXOL 300 MG/ML  SOLN
100.0000 mL | Freq: Once | INTRAMUSCULAR | Status: AC | PRN
  Administered 2023-06-28: 100 mL via INTRAVENOUS

## 2023-06-28 MED ORDER — SODIUM CHLORIDE 0.9 % IV BOLUS
500.0000 mL | Freq: Once | INTRAVENOUS | Status: AC
  Administered 2023-06-28: 500 mL via INTRAVENOUS

## 2023-06-28 MED ORDER — PANTOPRAZOLE SODIUM 20 MG PO TBEC: 20.0000 mg | DELAYED_RELEASE_TABLET | Freq: Every day | ORAL | 1 refills | Status: DC

## 2023-06-28 NOTE — ED Triage Notes (Signed)
Right side abdominal pain for the past few days, goes around to her back, with nausea. Some diarrhea as well.

## 2023-06-28 NOTE — ED Provider Notes (Signed)
[facility] Provider Note    Event Date/Time   First MD Initiated Contact with Patient 06/28/23 1110     (approximate)   History   Abdominal Pain   HPI  Amanda [name] is a 67 y.o. female Amanda [name] to the ER for evaluation of several days of progressively worsening abdominal pain associate with nausea vomiting.  No diarrhea.  Unable to keep anything down.  Is having right lower quadrant abdominal pain as well status post cholecystectomy.  Not currently on any antibiotics denies any dysuria.  No flank pain.  Denies any chest pain or shortness of breath.     Physical Exam   Triage Vital Signs: ED Triage Vitals  Encounter Vitals Group     BP 06/28/23 1009 (!) 158/78     Systolic BP Percentile --      Diastolic BP Percentile --      Pulse Rate 06/28/23 1009 79     Resp 06/28/23 1009 18     Temp 06/28/23 1009 98.1 F (36.7 C)     Temp Source 06/28/23 1009 Oral     SpO2 06/28/23 1009 98 %     Weight --      Height --      Head Circumference --      Peak Flow --      Pain Score 06/28/23 1008 (S) 8     Pain Loc --      Pain Education --      Exclude from Growth Chart --     Most recent vital signs: Vitals:   06/28/23 1330 06/28/23 1400  BP: (!) 153/72 (!) 167/78  Pulse: 81 79  Resp:    Temp:    SpO2: 97% 99%     Constitutional: Alert  Eyes: Conjunctivae are normal.  Head: Atraumatic. Nose: No congestion/rhinnorhea. Mouth/Throat: Mucous membranes are moist.   Neck: Painless ROM.  Cardiovascular:   Good peripheral circulation. Respiratory: Normal respiratory effort.  No retractions.  Gastrointestinal: Soft a with generalized tenderness to palpation.  No guarding or rebound. Musculoskeletal:  no deformity Neurologic:  MAE spontaneously. No gross focal neurologic deficits are appreciated.  Skin:  Skin is warm, dry and intact. No rash noted. Psychiatric: Mood and affect are normal. Speech and behavior are normal.    ED Results / Procedures /  Treatments   Labs (all labs ordered are listed, but only abnormal results are displayed) Labs Reviewed  COMPREHENSIVE METABOLIC PANEL - Abnormal; Notable for the following components:      Result Value   Glucose, Bld 120 (*)    BUN 24 (*)    Creatinine, Ser 1.15 (*)    GFR, Estimated 52 (*)    All other components within normal limits  URINALYSIS, ROUTINE W REFLEX MICROSCOPIC - Abnormal; Notable for the following components:   Color, Urine YELLOW (*)    APPearance HAZY (*)    Glucose, UA >=500 (*)    Ketones, ur 5 (*)    Protein, ur 30 (*)    Leukocytes,Ua TRACE (*)    Bacteria, UA RARE (*)    All other components within normal limits  LIPASE, BLOOD  CBC     EKG     RADIOLOGY Please see ED Course for my review and interpretation.  I personally reviewed all radiographic images ordered to evaluate for the above acute complaints and reviewed radiology reports and findings.  These findings were personally discussed with the patient.  Please see medical record  for radiology report.    PROCEDURES:  Critical Care performed: No  Procedures   MEDICATIONS ORDERED IN ED: Medications  morphine (PF) 4 MG/ML injection 4 mg (4 mg Intravenous Given 06/28/23 1231)  sodium chloride 0.9 % bolus 500 mL (0 mLs Intravenous Stopped 06/28/23 1331)  iohexol (OMNIPAQUE) 300 MG/ML solution 100 mL (100 mLs Intravenous Contrast Given 06/28/23 1238)  ondansetron (ZOFRAN) injection 4 mg (4 mg Intravenous Given 06/28/23 1231)     IMPRESSION / MDM / ASSESSMENT AND PLAN / ED COURSE  I reviewed the triage vital signs and the nursing notes.                              Differential diagnosis includes, but is not limited to, colitis, enteritis, SBO, appendicitis, pancreatitis, hepatitis, dehydration, electrolyte abnormality, UTI  Patient presenting to the ER for evaluation of symptoms as described above.  Based on symptoms, risk factors and considered above differential, this presenting complaint  could reflect a potentially life-threatening illness therefore the patient will be placed on continuous pulse oximetry and telemetry for monitoring.  Laboratory evaluation will be sent to evaluate for the above complaints.      Clinical Course as of 06/28/23 1437  Sat Jun 28, 2023  1300 CT imaging on my review and interpretation without evidence of obstruction. [PR]  1436 Reassessed.  Hemodynamically stable.  Blood work is reassuring.  CT imaging reassuring.  Will increase antacid medication to PPI as may have component of gastritis.  Does appear stable and appropriate for outpatient follow-up. [PR]    Clinical Course User Index [PR] Willy Eddy, MD     FINAL CLINICAL IMPRESSION(S) / ED DIAGNOSES   Final diagnoses:  Generalized abdominal pain     Rx / DC Orders   ED Discharge Orders          Ordered    pantoprazole (PROTONIX) 20 MG tablet  Daily        06/28/23 1437    ondansetron (ZOFRAN-ODT) 4 MG disintegrating tablet  Every 8 hours PRN        06/28/23 1437             Note:  This document was prepared using Dragon voice recognition software and may include unintentional dictation errors.    Willy Eddy, MD 06/28/23 [phone]

## 2023-06-30 ENCOUNTER — Emergency Department
Admission: EM | Admit: 2023-06-30 | Discharge: 2023-06-30 | Disposition: A | Discharge status: Home / Self Care | Payer: Medicare HMO | Attending: Emergency Medicine | Admitting: Emergency Medicine

## 2023-06-30 ENCOUNTER — Emergency Department: Payer: Medicare HMO

## 2023-06-30 ENCOUNTER — Encounter: Payer: Self-pay | Admitting: Emergency Medicine

## 2023-06-30 ENCOUNTER — Ambulatory Visit: Payer: Self-pay | Admitting: *Deleted

## 2023-06-30 ENCOUNTER — Other Ambulatory Visit: Payer: Self-pay

## 2023-06-30 DIAGNOSIS — I110 Hypertensive heart disease with heart failure: Secondary | ICD-10-CM | POA: Diagnosis not present

## 2023-06-30 DIAGNOSIS — E119 Type 2 diabetes mellitus without complications: Secondary | ICD-10-CM | POA: Insufficient documentation

## 2023-06-30 DIAGNOSIS — B029 Zoster without complications: Secondary | ICD-10-CM | POA: Diagnosis not present

## 2023-06-30 DIAGNOSIS — I509 Heart failure, unspecified: Secondary | ICD-10-CM | POA: Insufficient documentation

## 2023-06-30 DIAGNOSIS — I4891 Unspecified atrial fibrillation: Secondary | ICD-10-CM | POA: Insufficient documentation

## 2023-06-30 DIAGNOSIS — R21 Rash and other nonspecific skin eruption: Secondary | ICD-10-CM | POA: Diagnosis not present

## 2023-06-30 LAB — CBC
HCT: 40.7 % (ref 36.0–46.0)
Hemoglobin: 13.9 g/dL (ref 12.0–15.0)
MCH: 32 pg (ref 26.0–34.0)
MCHC: 34.2 g/dL (ref 30.0–36.0)
MCV: 93.6 fL (ref 80.0–100.0)
Platelets: 175 10*3/uL (ref 150–400)
RBC: 4.35 MIL/uL (ref 3.87–5.11)
RDW: 12.2 % (ref 11.5–15.5)
WBC: 5.5 10*3/uL (ref 4.0–10.5)
nRBC: 0 % (ref 0.0–0.2)

## 2023-06-30 LAB — COMPREHENSIVE METABOLIC PANEL
ALT: 54 U/L — ABNORMAL HIGH (ref 0–44)
AST: 50 U/L — ABNORMAL HIGH (ref 15–41)
Albumin: 4.3 g/dL (ref 3.5–5.0)
Alkaline Phosphatase: 91 U/L (ref 38–126)
Anion gap: 9 (ref 5–15)
BUN: 19 mg/dL (ref 8–23)
CO2: 26 mmol/L (ref 22–32)
Calcium: 10 mg/dL (ref 8.9–10.3)
Chloride: 103 mmol/L (ref 98–111)
Creatinine, Ser: 0.99 mg/dL (ref 0.44–1.00)
GFR, Estimated: >60 mL/min (ref >60)
Glucose, Bld: 141 mg/dL — ABNORMAL HIGH (ref 70–99)
Potassium: 4.4 mmol/L (ref 3.5–5.1)
Sodium: 138 mmol/L (ref 135–145)
Total Bilirubin: 1 mg/dL (ref 0.3–1.2)
Total Protein: 7.7 g/dL (ref 6.5–8.1)

## 2023-06-30 LAB — URINALYSIS, ROUTINE W REFLEX MICROSCOPIC
Bacteria, UA: NONE SEEN
Bilirubin Urine: NEGATIVE
Glucose, UA: >=500 mg/dL — AB
Hgb urine dipstick: NEGATIVE
Ketones, ur: 5 mg/dL — AB
Nitrite: NEGATIVE
Protein, ur: 100 mg/dL — AB
Specific Gravity, Urine: 1.025 (ref 1.005–1.030)
pH: 5 (ref 5.0–8.0)

## 2023-06-30 LAB — LIPASE, BLOOD: Lipase: 43 U/L (ref 11–51)

## 2023-06-30 MED ORDER — LIDOCAINE 5 % EX PTCH: 1.0000 | MEDICATED_PATCH | Freq: Two times a day (BID) | CUTANEOUS | 11 refills | Status: DC

## 2023-06-30 MED ORDER — OXYCODONE HCL 5 MG PO TABS: 5.0000 mg | ORAL_TABLET | Freq: Three times a day (TID) | ORAL | 0 refills | Status: DC | PRN

## 2023-06-30 MED ORDER — VALACYCLOVIR HCL 1 G PO TABS: 2000.0000 mg | ORAL_TABLET | Freq: Three times a day (TID) | ORAL | 0 refills | Status: AC

## 2023-06-30 NOTE — Telephone Encounter (Signed)
  Chief Complaint: Abdominal Pain Symptoms: Right sided abdominal pain, radiates to back. 7-8/10, Vomited x 3 early AM. Rash at waistline Frequency: Thursday, worsening. ED Saturday Pertinent Negatives: Patient denies fever,diarrhea Disposition: [x] ED /[] Urgent Care (no appt availability in office) / [] Appointment(In office/virtual)/ []  [facility]/ [] Home Care/ [] Refused Recommended Disposition /[] [facility]/ []  Follow-up with PCP Additional Notes:  States rash is new. Red, raised at waistline, "Patches." No blistering. No availability at practice today, called for consult, Heather. Pt states "I can't take this pain much longer." Advised return to ED, states will follow disposition. Reason for Disposition  [1] SEVERE pain (e.g., excruciating) AND [2] present > 1 hour  Answer Assessment - Initial Assessment Questions 1. LOCATION: "Where does it hurt?"      Right sided 2. RADIATION: "Does the pain shoot anywhere else?" (e.g., chest, back)     Back 3. ONSET: "When did the pain begin?" (e.g., minutes, hours or days ago)      Off and on Thursday 4. SUDDEN: "Gradual or sudden onset?"     Gradual 5. PATTERN "Does the pain come and go, or is it constant?"    - If it comes and goes: "How long does it last?" "Do you have pain now?"     (Note: Comes and goes means the pain is intermittent. It goes away completely between bouts.)    - If constant: "Is it getting better, staying the same, or getting worse?"      (Note: Constant means the pain never goes away completely; most serious pain is constant and gets worse.)      constant 6. SEVERITY: "How bad is the pain?"  (e.g., Scale 1-10; mild, moderate, or severe)    - MILD (1-3): Doesn't interfere with normal activities, abdomen soft and not tender to touch.     - MODERATE (4-7): Interferes with normal activities or awakens from sleep, abdomen tender to touch.     - SEVERE (8-10): Excruciating pain, doubled over, unable to do  any normal activities.       7/10 7. RECURRENT SYMPTOM: "Have you ever had this type of stomach pain before?" If Yes, ask: "When was the last time?" and "What happened that time?"      No 8. CAUSE: "What do you think is causing the stomach pain?"     Unsure 9. RELIEVING/AGGRAVATING FACTORS: "What makes it better or worse?" (e.g., antacids, bending or twisting motion, bowel movement)     no 10. OTHER SYMPTOMS: "Do you have any other symptoms?" (e.g., back pain, diarrhea, fever, urination pain, vomiting)       Vomiting x 3 this Am 0300, Nausea med helped. Rash at waistline  Protocols used: Abdominal Pain - [facility]

## 2023-06-30 NOTE — Group Note (Deleted)

## 2023-06-30 NOTE — Discharge Instructions (Addendum)
Take valacyclovir antiviral for the full 7-day course as prescribed.  Take acetaminophen 650 mg and ibuprofen 400 mg every 6 hours for pain.  Take with food. Try lidocaine patches to the affected area for pain control. Take oxycodone for severe/breakthrough pain only, for pain that is very severe despite taking the above medications.  Thank you for choosing [place] for your health care today!  Please see your primary doctor this week for a follow up appointment.   If you have any new, worsening, or unexpected symptoms call your doctor right away or come back to the emergency department for reevaluation.  It was my pleasure to care for you today.   Daneil Dan Modesto Charon, MD

## 2023-06-30 NOTE — ED Provider Notes (Signed)
[facility] Provider Note    Event Date/Time   First MD Initiated Contact with Patient 06/30/23 [phone]     (approximate)   History   Abdominal Pain   HPI  Amanda [name] is a 67 y.o. female   Past medical history of atrial fibrillation, CHF, diabetes, hypertension hyperlipidemia, status post cholecystectomy who presents emergency department with right-sided abdominal/back pain and rash.  She has had some nausea and vomiting with the pain.  She was evaluated Emergency Department with negative CT scan of the abdomen pelvis a couple of days ago.  Pain is worse no new rashes develop.  Denies fever or chills.  P.o. intake has been okay, voiding appropriately, no GU symptoms.  Independent Historian contributed to assessment above: Irving Burton members at bedside corroborate information past medical history as above  External Medical Documents Reviewed: CT scan abdomen pelvis performed on 06/28/2023 without any acute intra-abdominal abnormalities      Physical Exam   Triage Vital Signs: ED Triage Vitals  Encounter Vitals Group     BP 06/30/23 0931 (!) 150/92     Systolic BP Percentile --      Diastolic BP Percentile --      Pulse Rate 06/30/23 0931 73     Resp 06/30/23 0931 18     Temp 06/30/23 0931 97.9 F (36.6 C)     Temp Source 06/30/23 0931 Oral     SpO2 06/30/23 0931 100 %     Weight 06/30/23 0930 175 lb (79.4 kg)     Height 06/30/23 0930 5\' 1"  (1.549 m)     Head Circumference --      Peak Flow --      Pain Score 06/30/23 0930 6     Pain Loc --      Pain Education --      Exclude from Growth Chart --     Most recent vital signs: Vitals:   06/30/23 0931  BP: (!) 150/92  Pulse: 73  Resp: 18  Temp: 97.9 F (36.6 C)  SpO2: 100%    General: Awake, no distress.  CV:  Good peripheral perfusion.  Resp:  Normal effort.  Abd:  No distention.  Other:  Soft nontender abdomen there is a rash to the lower right sided abdomen that wraps around to the  back does not cross midline.  No vesicular lesions,, patchy red rash.   ED Results / Procedures / Treatments   Labs (all labs ordered are listed, but only abnormal results are displayed) Labs Reviewed  COMPREHENSIVE METABOLIC PANEL - Abnormal; Notable for the following components:      Result Value   Glucose, Bld 141 (*)    AST 50 (*)    ALT 54 (*)    All other components within normal limits  URINALYSIS, ROUTINE W REFLEX MICROSCOPIC - Abnormal; Notable for the following components:   Color, Urine YELLOW (*)    APPearance HAZY (*)    Glucose, UA >=500 (*)    Ketones, ur 5 (*)    Protein, ur 100 (*)    Leukocytes,Ua SMALL (*)    All other components within normal limits  LIPASE, BLOOD  CBC     I ordered and reviewed the above labs they are notable for LFTs slightly elevated from prior testing, bilirubin normal.  White blood cell count normal.  PROCEDURES:  Critical Care performed: No  Procedures   MEDICATIONS ORDERED IN ED: Medications - No data to display  IMPRESSION /  MDM / ASSESSMENT AND PLAN / ED COURSE  I reviewed the triage vital signs and the nursing notes.                                Patient's presentation is most consistent with acute presentation with potential threat to life or bodily function.  Differential diagnosis includes, but is not limited to, shingles, choledocholithiasis, appendicitis, pyelonephritis, renal colic   The patient is on the cardiac monitor to evaluate for evidence of arrhythmia and/or significant heart rate changes.  MDM:    Characteristic shingles rash does not cross midline, pain with light touch to the skin, will treat with antiviral and pain control.  Consider intra-abdominal infection or choledocholithiasis but less likely given benign abdominal exam, and CT scan of the abdomen pelvis negative yesterday, bilirubin is normal, and I think her slight bump in her LFTs is due to her vomiting episode last night, associated with  her pain.  Plan for shingles treatment as an outpatient follow-up with PMD.       FINAL CLINICAL IMPRESSION(S) / ED DIAGNOSES   Final diagnoses:  Herpes zoster without complication     Rx / DC Orders   ED Discharge Orders          Ordered    valACYclovir (VALTREX) 1000 MG tablet  3 times daily        06/30/23 1026    oxyCODONE (ROXICODONE) 5 MG immediate release tablet  Every 8 hours PRN        06/30/23 1026    lidocaine (LIDODERM) 5 %  Every 12 hours        06/30/23 1027             Note:  This document was prepared using Dragon voice recognition software and may include unintentional dictation errors.    Pilar Jarvis, MD 06/30/23 1030

## 2023-06-30 NOTE — ED Triage Notes (Signed)
Patient to ED via POV for right sided abd pain that radiates into back. Seen for same on 9/7. Now states rash on abd and vomiting.

## 2023-07-02 ENCOUNTER — Encounter: Payer: Self-pay | Admitting: Physician Assistant

## 2023-07-02 ENCOUNTER — Ambulatory Visit (INDEPENDENT_AMBULATORY_CARE_PROVIDER_SITE_OTHER): Payer: Medicare HMO | Admitting: Physician Assistant

## 2023-07-02 VITALS — BP 114/75 | HR 77 | Ht 61.0 in | Wt 182.4 lb

## 2023-07-02 DIAGNOSIS — B029 Zoster without complications: Secondary | ICD-10-CM | POA: Diagnosis not present

## 2023-07-02 DIAGNOSIS — Z09 Encounter for follow-up examination after completed treatment for conditions other than malignant neoplasm: Secondary | ICD-10-CM | POA: Diagnosis not present

## 2023-07-02 NOTE — Patient Instructions (Addendum)
  I recommend that you start taking a stool softener or laxative to help you have a bowel movement If needed you can use Miralax until you are having bowel movements on a regular basis  You can also try to use Miralax daily to retrain your bowels until you are having regular bowel movements. Take the recommended dose once daily then gradually reduce the dose until you are having regular daily bowel movements that are comfortable

## 2023-07-02 NOTE — Progress Notes (Signed)
Acute Office Visit   Patient: Amanda [name]   DOB: 11-Jan-1956   67 y.o. Female  MRN: [id number] Visit Date: 07/02/2023  Today's healthcare provider: Oswaldo Conroy Nylene Inlow, PA-C  Introduced myself to the patient as a Secondary school teacher and provided education on APPs in clinical practice.    Chief Complaint  Patient presents with   Hospitalization Follow-up   Abdominal Pain   Herpes Zoster   Subjective    HPI     Patient was seen in the ED on 06/28/23 and 9/9//24 for abdominal pain and shingles respectively   She was started on Valtrex and provided with Oxycodone 5 mg and Lidocaine to assist with shingles resolution and management Reviewed ED lab results and CT abdomen/pelvis from 06/28/23 and results from 06/30/23  Today she reports she is feeling a lot better than she did on Sat  She reports her rash has not developed any blisters but is still pretty tender  She is trying to avoid sitting or applying pressure to the affected area  She states her abdominal pain is resolving  She states her initial pain was "horrible" and wrapped around right side of lower abdomen to the right side of her back She states since she started the medications for the Shingles rash she has not had stomach pain  She has not had any Nausea, vomiting or diarrhea since her second ED visit She does admit that she has not had a bowel movement since Friday but is passing gas every once in a while   She is only taking Oxycodone as needed for pain  Her rash was first noted at home on 06/29/23 she started valtrex on monday  Medications: Outpatient Medications Prior to Visit  Medication Sig   albuterol (VENTOLIN HFA) 108 (90 Base) MCG/ACT inhaler Inhale 2 puffs into the lungs every 6 (six) hours as needed for wheezing or shortness of breath.   Calcium Carb-Cholecalciferol (CALCIUM 600+D3) 600-200 MG-UNIT TABS Take 1 tablet by mouth in the morning.   carvedilol (COREG) 12.5 MG tablet TAKE 1 TABLET BY MOUTH TWICE DAILY WITH A  MEAL   empagliflozin (JARDIANCE) 10 MG TABS tablet TAKE 1 TABLET BY MOUTH DAILY BEFORE BREAKFAST   ferrous sulfate 325 (65 FE) MG tablet Take 1 tablet (325 mg total) by mouth daily.   furosemide (LASIX) 20 MG tablet TAKE 1 TABLET BY MOUTH ONCE DAILY *REFILL REQUEST*   lidocaine (LIDODERM) 5 % Place 1 patch onto the skin every 12 (twelve) hours. Remove & Discard patch within 12 hours or as directed by MD   Multiple Vitamin (MULTIVITAMIN) tablet Take 1 tablet by mouth daily.   ondansetron (ZOFRAN-ODT) 4 MG disintegrating tablet Take 1 tablet (4 mg total) by mouth every 8 (eight) hours as needed for nausea or vomiting.   oxyCODONE (ROXICODONE) 5 MG immediate release tablet Take 1 tablet (5 mg total) by mouth every 8 (eight) hours as needed for up to 10 doses for severe pain or breakthrough pain.   pantoprazole (PROTONIX) 20 MG tablet Take 1 tablet (20 mg total) by mouth daily.   potassium chloride SA (KLOR-CON M) 20 MEQ tablet TAKE ONE TABLET BY MOUTH EVERY MORNING   rosuvastatin (CRESTOR) 5 MG tablet TAKE 1 TABLET BY MOUTH ONCE DAILY *REFILL REQUEST*   sacubitril-valsartan (ENTRESTO) 97-103 MG TAKE 1 TABLET BY MOUTH TWICE DAILY   Semaglutide, 2 MG/DOSE, 8 MG/3ML SOPN Inject 2 mg as directed once a week.   valACYclovir (VALTREX)  1000 MG tablet Take 2 tablets (2,000 mg total) by mouth 3 (three) times daily for 7 days.   vitamin B-12 (CYANOCOBALAMIN) 500 MCG tablet Take 500 mcg by mouth daily.   omeprazole (PRILOSEC) 20 MG capsule Take 20 mg by mouth daily as needed. (Patient not taking: Reported on 07/02/2023)   spironolactone (ALDACTONE) 25 MG tablet Take 0.5 tablets (12.5 mg total) by mouth daily.   No facility-administered medications prior to visit.    Review of Systems  Constitutional:  Negative for chills, fatigue and fever.  Respiratory:  Negative for shortness of breath.   Gastrointestinal:  Positive for constipation. Negative for abdominal pain, blood in stool, diarrhea, nausea, rectal  pain and vomiting.  Genitourinary:  Negative for dysuria.  Skin:  Positive for rash.  Neurological:  Negative for dizziness, light-headedness and headaches.        Objective    BP 114/75   Pulse 77   Ht 5\' 1"  (1.549 m)   Wt 182 lb 6.4 oz (82.7 kg)   SpO2 100%   BMI 34.46 kg/m     Physical Exam Vitals reviewed.  Constitutional:      General: She is awake.     Appearance: Normal appearance. She is well-developed and well-groomed.  HENT:     Head: Normocephalic and atraumatic.  Abdominal:     General: Abdomen is flat. Bowel sounds are normal.     Palpations: Abdomen is soft.     Tenderness: There is abdominal tenderness.       Comments: Tenderness along right lower quadrant and side wear rash is present   Musculoskeletal:     Cervical back: Normal range of motion.  Neurological:     Mental Status: She is alert.  Psychiatric:        Attention and Perception: Attention and perception normal.        Mood and Affect: Mood and affect normal.        Speech: Speech normal.        Behavior: Behavior is cooperative.       No results found for any visits on 07/02/23.  Assessment & Plan      No follow-ups on file.      Problem List Items Addressed This Visit   None Visit Diagnoses     Herpes zoster without complication    -  Primary Acute,new concern Patient was seen in ED on 06/28/23 and again on 06/30/23 for abdominal pain and shingles respectively  Reviewed ED visit notes from both encounters along with labs and imaging results  Rash and symptom presentation does appear consistent with shingles- she is taking Valtrex as directed for management  Recommend she finishes course She reports abdominal pain has subsided since starting the Valtrex and with oxycodone PRN - suspect pain was secondary to developing Shingles infection so this should continue to improve with treatment  Reviewed OTC management for constipation since she is taking opioid pain medication and this  may cause some further abdominal pain  Reviewed ED and return precautions Follow up as needed for persistent or progressing symptoms     Encounter for follow-up            No follow-ups on file.   I, Sangita Zani E Monte Zinni, PA-C, have reviewed all documentation for this visit. The documentation on 07/04/23 for the exam, diagnosis, procedures, and orders are all accurate and complete.   Jacquelin Hawking, MHS, PA-C [facility]

## 2023-07-09 ENCOUNTER — Other Ambulatory Visit: Payer: Self-pay | Admitting: Nurse Practitioner

## 2023-07-10 NOTE — Telephone Encounter (Signed)
Requested Prescriptions  Pending Prescriptions Disp Refills   Semaglutide, 2 MG/DOSE, (OZEMPIC, 2 MG/DOSE,) 8 MG/3ML SOPN [Pharmacy Med Name: OZEMPIC 2MG  DOSE 8MG /3ML 8 Solution Pen-injector] 3 mL 0    Sig: INJECT 2MG  SUBCUTANEOUSLY ONCE A WEEK     Endocrinology:  Diabetes - GLP-1 Receptor Agonists - semaglutide Failed - 07/09/2023  6:52 PM      Failed - HBA1C in normal range and within 180 days    Hemoglobin A1C  Date Value Ref Range Status  06/12/2016 7.8  Final   HB A1C (BAYER DCA - WAIVED)  Date Value Ref Range Status  01/22/2022 6.9 (H) 4.8 - 5.6 % Final    Comment:             Prediabetes: 5.7 - 6.4          Diabetes: >6.4          Glycemic control for adults with diabetes: <7.0    Hgb A1c MFr Bld  Date Value Ref Range Status  04/03/2023 7.4 (H) 4.8 - 5.6 % Final    Comment:             Prediabetes: 5.7 - 6.4          Diabetes: >6.4          Glycemic control for adults with diabetes: <7.0          Passed - Cr in normal range and within 360 days    Creatinine, Ser  Date Value Ref Range Status  06/30/2023 0.99 0.44 - 1.00 mg/dL Final         Passed - Valid encounter within last 6 months    Recent Outpatient Visits           1 week ago Herpes zoster without complication   [facility] Mecum, Erin E, PA-C   3 months ago Hypertension associated with diabetes [facility])   [facility] Larae Grooms, NP   6 months ago Hypertension associated with diabetes [facility])   [facility] Larae Grooms, NP   9 months ago AF (paroxysmal atrial fibrillation) [facility])   [facility] Larae Grooms, NP   1 year ago Acute on chronic diastolic CHF (congestive heart failure) [facility])   [facility] Larae Grooms, NP       Future Appointments             In 3 weeks Larae Grooms, NP [name]

## 2023-07-29 ENCOUNTER — Telehealth: Payer: Self-pay

## 2023-07-29 NOTE — Telephone Encounter (Signed)
Transition Care Management Unsuccessful Follow-up Telephone Call  Date of discharge and from where:  [place] 9/9  Attempts:  2nd Attempt  Reason for unsuccessful TCM follow-up call:  No answer/busy   Lenard Forth [place]  [facility], [facility] Guide, Phone: [phone] Website: [email or web address]

## 2023-07-29 NOTE — Telephone Encounter (Signed)
Transition Care Management Unsuccessful Follow-up Telephone Call  Date of discharge and from where:  [place] 9/9  Attempts:  1st Attempt  Reason for unsuccessful TCM follow-up call:  No answer/busy   Lenard Forth [place]  [facility], [facility] Guide, Phone: [phone] Website: [email or web address]

## 2023-08-03 NOTE — Progress Notes (Unsigned)
There were no vitals taken for this visit.   Subjective:    Patient ID: Amanda [name], female    DOB: 15-Sep-1956, 67 y.o.   MRN: [id number]  HPI: Amanda [name] is a 67 y.o. female presenting on 08/04/2023 for comprehensive medical examination. Current medical complaints include:{Blank single:19197::"none","***"}  She currently lives with: Menopausal Symptoms: {Blank single:19197::"yes","no"}  HYPERTENSION / HYPERLIPIDEMIA/HF Patient is weighing several times a week.   Satisfied with current treatment? yes Duration of hypertension: chronic BP monitoring frequency: a few times a week BP range: 116-130-80 BP medication side effects: no Past BP meds: carvedilol, lasix, entresto Duration of hyperlipidemia: chronic Cholesterol medication side effects: no Cholesterol supplements: none Past cholesterol medications: Crestor Medication compliance: Compliant Aspirin: no Recent stressors: no Recurrent headaches: no Visual changes: no Palpitations: no Dyspnea: no Chest pain: no Lower extremity edema: no Dizzy/lightheaded: no   DIABETES Hypoglycemic episodes:no Polydipsia/polyuria: no Visual disturbance: no Chest pain: no Paresthesias: no Glucose Monitoring: no  Accucheck frequency: not checking  Fasting glucose:   Post prandial:  Evening:  Before meals: Taking Insulin?: no  Long acting insulin:  Short acting insulin: Blood Pressure Monitoring: a few times a week Retinal Examination: Up to date;  Foot Exam: Up to Date;  Diabetic Education: Completed Pneumovax: not yet Influenza: Not up to Date Aspirin: no  CHRONIC KIDNEY DISEASE CKD status: stable Medications renally dose: yes Previous renal evaluation: yes; Pneumovax:  Not up to Date Influenza Vaccine:  Not up to Date  ANEMIA Anemia status: stable Etiology of anemia: Duration of anemia treatment:  Compliance with treatment: excellent compliance Iron supplementation side effects: no Severity of anemia:  mild Fatigue: yes Decreased exercise tolerance: no  Dyspnea on exertion: no Palpitations: no Bleeding: no Pica: no  Depression Screen done today and results listed below:     07/02/2023    9:36 AM 04/03/2023    9:44 AM 01/01/2023    9:10 AM 12/10/2022   11:15 AM 10/02/2022    9:40 AM  Depression screen PHQ 2/9  Decreased Interest 0 0 0 0 0  Down, Depressed, Hopeless 0 0 0 0 0  PHQ - 2 Score 0 0 0 0 0  Altered sleeping 1 0 0 0 1  Tired, decreased energy 0 0 0 0 1  Change in appetite 0 0 0 0 0  Feeling bad or failure about yourself  0 0 0 0 0  Trouble concentrating 0 0 0 0 0  Moving slowly or fidgety/restless 0 0 0 0 0  Suicidal thoughts 0 0 0 0 0  PHQ-9 Score 1 0 0 0 2  Difficult doing work/chores Not difficult at all Not difficult at all Not difficult at all Not difficult at all Not difficult at all    The patient {has/does not WUJW:11914} a history of falls. I {did/did not:19850} complete a risk assessment for falls. A plan of care for falls {was/was not:19852} documented.   Past Medical History:  Past Medical History:  Diagnosis Date   Anxiety    Arthritis    Atrial fibrillation (HCC)    CHF (congestive heart failure) (HCC)    Cholecystitis    Cholecystitis    Diabetes mellitus without complication (HCC)    Dyspnea    GERD (gastroesophageal reflux disease)    Hyperlipidemia    Hypertension     Surgical History:  Past Surgical History:  Procedure Laterality Date   CARDIOVERSION N/A 02/13/2022   Procedure: CARDIOVERSION;  Surgeon: Lamar Blinks, MD;  Location: [facility] ORS;  Service: Cardiovascular;  Laterality: N/A;   COLONOSCOPY  2012   IR CHOLANGIOGRAM EXISTING TUBE  12/11/2021   IR PERC CHOLECYSTOSTOMY  11/02/2021    Medications:  Current Outpatient Medications on File Prior to Visit  Medication Sig   albuterol (VENTOLIN HFA) 108 (90 Base) MCG/ACT inhaler Inhale 2 puffs into the lungs every 6 (six) hours as needed for wheezing or shortness of breath.    Calcium Carb-Cholecalciferol (CALCIUM 600+D3) 600-200 MG-UNIT TABS Take 1 tablet by mouth in the morning.   carvedilol (COREG) 12.5 MG tablet TAKE 1 TABLET BY MOUTH TWICE DAILY WITH A MEAL   empagliflozin (JARDIANCE) 10 MG TABS tablet TAKE 1 TABLET BY MOUTH DAILY BEFORE BREAKFAST   ferrous sulfate 325 (65 FE) MG tablet Take 1 tablet (325 mg total) by mouth daily.   furosemide (LASIX) 20 MG tablet TAKE 1 TABLET BY MOUTH ONCE DAILY *REFILL REQUEST*   lidocaine (LIDODERM) 5 % Place 1 patch onto the skin every 12 (twelve) hours. Remove & Discard patch within 12 hours or as directed by MD   Multiple Vitamin (MULTIVITAMIN) tablet Take 1 tablet by mouth daily.   omeprazole (PRILOSEC) 20 MG capsule Take 20 mg by mouth daily as needed. (Patient not taking: Reported on 07/02/2023)   ondansetron (ZOFRAN-ODT) 4 MG disintegrating tablet Take 1 tablet (4 mg total) by mouth every 8 (eight) hours as needed for nausea or vomiting.   oxyCODONE (ROXICODONE) 5 MG immediate release tablet Take 1 tablet (5 mg total) by mouth every 8 (eight) hours as needed for up to 10 doses for severe pain or breakthrough pain.   pantoprazole (PROTONIX) 20 MG tablet Take 1 tablet (20 mg total) by mouth daily.   potassium chloride SA (KLOR-CON M) 20 MEQ tablet TAKE ONE TABLET BY MOUTH EVERY MORNING   rosuvastatin (CRESTOR) 5 MG tablet TAKE 1 TABLET BY MOUTH ONCE DAILY *REFILL REQUEST*   sacubitril-valsartan (ENTRESTO) 97-103 MG TAKE 1 TABLET BY MOUTH TWICE DAILY   Semaglutide, 2 MG/DOSE, (OZEMPIC, 2 MG/DOSE,) 8 MG/3ML SOPN INJECT 2MG  SUBCUTANEOUSLY ONCE A WEEK   spironolactone (ALDACTONE) 25 MG tablet Take 0.5 tablets (12.5 mg total) by mouth daily.   vitamin B-12 (CYANOCOBALAMIN) 500 MCG tablet Take 500 mcg by mouth daily.   No current facility-administered medications on file prior to visit.    Allergies:  Allergies  Allergen Reactions   Tramadol Other (See Comments)    "woozy"   Penicillins Other (See Comments)     Dizziness Tolerated augmentin but does not like taste    Social History:  Social History   Socioeconomic History   Marital status: Single    Spouse name: [name]   Number of children: Not on file   Years of education: Not on file   Highest education level: Not on file  Occupational History   Not on file  Tobacco Use   Smoking status: Former    Current packs/day: 0.00    Types: Cigarettes    Quit date: 12/07/1975    Years since quitting: 47.6   Smokeless tobacco: Never  Vaping Use   Vaping status: Never Used  Substance and Sexual Activity   Alcohol use: No   Drug use: No   Sexual activity: Not on file  Other Topics Concern   Not on file  Social History Narrative   Not on file   Social Determinants of Health   Financial Resource Strain: High Risk (12/23/2022)   Overall Financial Resource Strain (CARDIA)  Difficulty of Paying Living Expenses: Hard  Food Insecurity: No Food Insecurity (12/10/2022)   Hunger Vital Sign    Worried About Running Out of Food in the Last Year: Never true    Ran Out of Food in the Last Year: Never true  Transportation Needs: No Transportation Needs (12/10/2022)   PRAPARE - Administrator, Civil Service (Medical): No    Lack of Transportation (Non-Medical): No  Physical Activity: Insufficiently Active (12/10/2022)   Exercise Vital Sign    Days of Exercise per Week: 3 days    Minutes of Exercise per Session: 30 min  Stress: No Stress Concern Present (12/10/2022)   [employer] of Occupational Health - Occupational Stress Questionnaire    Feeling of Stress : Not at all  Social Connections: Socially Isolated (12/10/2022)   Social Connection and Isolation Panel [NHANES]    Frequency of Communication with Friends and Family: More than three times a week    Frequency of Social Gatherings with Friends and Family: Twice a week    Attends Religious Services: Never    Database administrator or Organizations: No    Attends Tax inspector Meetings: Never    Marital Status: Never married  Intimate Partner Violence: Not At Risk (12/10/2022)   Humiliation, Afraid, Rape, and Kick questionnaire    Fear of Current or Ex-Partner: No    Emotionally Abused: No    Physically Abused: No    Sexually Abused: No   Social History   Tobacco Use  Smoking Status Former   Current packs/day: 0.00   Types: Cigarettes   Quit date: 12/07/1975   Years since quitting: 47.6  Smokeless Tobacco Never   Social History   Substance and Sexual Activity  Alcohol Use No    Family History:  Family History  Problem Relation Age of Onset   COPD Mother    Hypertension Mother    Diabetes Mother    Severe combined immunodeficiency Father    Diabetes Sister    Heart disease Sister    Heart disease Sister    Cancer Niece     Past medical history, surgical history, medications, allergies, family history and social history reviewed with patient today and changes made to appropriate areas of the chart.   ROS All other ROS negative except what is listed above and in the HPI.      Objective:    There were no vitals taken for this visit.  Wt Readings from Last 3 Encounters:  07/02/23 182 lb 6.4 oz (82.7 kg)  06/30/23 175 lb (79.4 kg)  04/14/23 180 lb 9.6 oz (81.9 kg)    Physical Exam  Results for orders placed or performed during the hospital encounter of 06/30/23  Lipase, blood  Result Value Ref Range   Lipase 43 11 - 51 U/L  Comprehensive metabolic panel  Result Value Ref Range   Sodium 138 135 - 145 mmol/L   Potassium 4.4 3.5 - 5.1 mmol/L   Chloride 103 98 - 111 mmol/L   CO2 26 22 - 32 mmol/L   Glucose, Bld 141 (H) 70 - 99 mg/dL   BUN 19 8 - 23 mg/dL   Creatinine, Ser 1.61 0.44 - 1.00 mg/dL   Calcium 09.6 8.9 - 04.5 mg/dL   Total Protein 7.7 6.5 - 8.1 g/dL   Albumin 4.3 3.5 - 5.0 g/dL   AST 50 (H) 15 - 41 U/L   ALT 54 (H) 0 - 44 U/L   Alkaline  Phosphatase 91 38 - 126 U/L   Total Bilirubin 1.0 0.3 - 1.2 mg/dL    GFR, Estimated >08 >65 mL/min   Anion gap 9 5 - 15  CBC  Result Value Ref Range   WBC 5.5 4.0 - 10.5 K/uL   RBC 4.35 3.87 - 5.11 MIL/uL   Hemoglobin 13.9 12.0 - 15.0 g/dL   HCT 78.4 69.6 - 29.5 %   MCV 93.6 80.0 - 100.0 fL   MCH 32.0 26.0 - 34.0 pg   MCHC 34.2 30.0 - 36.0 g/dL   RDW 28.4 13.2 - 44.0 %   Platelets 175 150 - 400 K/uL   nRBC 0.0 0.0 - 0.2 %  Urinalysis, Routine w reflex microscopic -Urine, Clean Catch  Result Value Ref Range   Color, Urine YELLOW (A) YELLOW   APPearance HAZY (A) CLEAR   Specific Gravity, Urine 1.025 1.005 - 1.030   pH 5.0 5.0 - 8.0   Glucose, UA >=500 (A) NEGATIVE mg/dL   Hgb urine dipstick NEGATIVE NEGATIVE   Bilirubin Urine NEGATIVE NEGATIVE   Ketones, ur 5 (A) NEGATIVE mg/dL   Protein, ur 102 (A) NEGATIVE mg/dL   Nitrite NEGATIVE NEGATIVE   Leukocytes,Ua SMALL (A) NEGATIVE   RBC / HPF 0-5 0 - 5 RBC/hpf   WBC, UA 21-50 0 - 5 WBC/hpf   Bacteria, UA NONE SEEN NONE SEEN   Squamous Epithelial / HPF 11-20 0 - 5 /HPF      Assessment & Plan:   Problem List Items Addressed This Visit       Cardiovascular and Mediastinum   Hypertension associated with diabetes (HCC) - Primary   AF (paroxysmal atrial fibrillation) (HCC)   Chronic diastolic CHF (congestive heart failure), NYHA class 2 (HCC)     Endocrine   Hyperlipidemia associated with type 2 diabetes mellitus (HCC)   Uncontrolled type 2 diabetes mellitus with hyperglycemia, without long-term current use of insulin (HCC)     Genitourinary   CKD (chronic kidney disease) stage 3, GFR 30-59 ml/min (HCC)     Hematopoietic and Hemostatic   Pancytopenia (HCC)     Other   Obesity   B12 deficiency   Anemia     Follow up plan: No follow-ups on file.   LABORATORY TESTING:  - Pap smear: {Blank single:19197::"pap done","not applicable","up to date","done elsewhere"}  IMMUNIZATIONS:   - Tdap: Tetanus vaccination status reviewed: {tetanus status:315746}. - Influenza: {Blank  single:19197::"Up to date","Administered today","Postponed to flu season","Refused","Given elsewhere"} - Pneumovax: {Blank single:19197::"Up to date","Administered today","Not applicable","Refused","Given elsewhere"} - Prevnar: {Blank single:19197::"Up to date","Administered today","Not applicable","Refused","Given elsewhere"} - COVID: {Blank single:19197::"Up to date","Administered today","Not applicable","Refused","Given elsewhere"} - HPV: {Blank single:19197::"Up to date","Administered today","Not applicable","Refused","Given elsewhere"} - Shingrix vaccine: {Blank single:19197::"Up to date","Administered today","Not applicable","Refused","Given elsewhere"}  SCREENING: -Mammogram: {Blank single:19197::"Up to date","Ordered today","Not applicable","Refused","Done elsewhere"}  - Colonoscopy: {Blank single:19197::"Up to date","Ordered today","Not applicable","Refused","Done elsewhere"}  - Bone Density: {Blank single:19197::"Up to date","Ordered today","Not applicable","Refused","Done elsewhere"}  -Hearing Test: {Blank single:19197::"Up to date","Ordered today","Not applicable","Refused","Done elsewhere"}  -Spirometry: {Blank single:19197::"Up to date","Ordered today","Not applicable","Refused","Done elsewhere"}   PATIENT COUNSELING:   Advised to take 1 mg of folate supplement per day if capable of pregnancy.   Sexuality: Discussed sexually transmitted diseases, partner selection, use of condoms, avoidance of unintended pregnancy  and contraceptive alternatives.   Advised to avoid cigarette smoking.  I discussed with the patient that most people either abstain from alcohol or drink within safe limits (<=14/week and <=4 drinks/occasion for males, <=7/weeks and <= 3 drinks/occasion for females) and that  the risk for alcohol disorders and other health effects rises proportionally with the number of drinks per week and how often a drinker exceeds daily limits.  Discussed cessation/primary prevention  of drug use and availability of treatment for abuse.   Diet: Encouraged to adjust caloric intake to maintain  or achieve ideal body weight, to reduce intake of dietary saturated fat and total fat, to limit sodium intake by avoiding high sodium foods and not adding table salt, and to maintain adequate dietary potassium and calcium preferably from fresh fruits, vegetables, and low-fat dairy products.    stressed the importance of regular exercise  Injury prevention: Discussed safety belts, safety helmets, smoke detector, smoking near bedding or upholstery.   Dental health: Discussed importance of regular tooth brushing, flossing, and dental visits.    NEXT PREVENTATIVE PHYSICAL DUE IN 1 YEAR. No follow-ups on file.

## 2023-08-04 ENCOUNTER — Encounter: Payer: Self-pay | Admitting: Nurse Practitioner

## 2023-08-04 ENCOUNTER — Ambulatory Visit (INDEPENDENT_AMBULATORY_CARE_PROVIDER_SITE_OTHER): Payer: Medicare HMO | Admitting: Nurse Practitioner

## 2023-08-04 VITALS — BP 115/72 | HR 80 | Temp 98.5°F | Ht 61.0 in | Wt 179.0 lb

## 2023-08-04 DIAGNOSIS — E538 Deficiency of other specified B group vitamins: Secondary | ICD-10-CM

## 2023-08-04 DIAGNOSIS — E1169 Type 2 diabetes mellitus with other specified complication: Secondary | ICD-10-CM

## 2023-08-04 DIAGNOSIS — E1159 Type 2 diabetes mellitus with other circulatory complications: Secondary | ICD-10-CM | POA: Diagnosis not present

## 2023-08-04 DIAGNOSIS — I152 Hypertension secondary to endocrine disorders: Secondary | ICD-10-CM | POA: Diagnosis not present

## 2023-08-04 DIAGNOSIS — E785 Hyperlipidemia, unspecified: Secondary | ICD-10-CM | POA: Diagnosis not present

## 2023-08-04 DIAGNOSIS — D61818 Other pancytopenia: Secondary | ICD-10-CM | POA: Diagnosis not present

## 2023-08-04 DIAGNOSIS — D649 Anemia, unspecified: Secondary | ICD-10-CM

## 2023-08-04 DIAGNOSIS — E1165 Type 2 diabetes mellitus with hyperglycemia: Secondary | ICD-10-CM | POA: Diagnosis not present

## 2023-08-04 DIAGNOSIS — Z23 Encounter for immunization: Secondary | ICD-10-CM

## 2023-08-04 DIAGNOSIS — Z0000 Encounter for general adult medical examination without abnormal findings: Secondary | ICD-10-CM | POA: Diagnosis not present

## 2023-08-04 DIAGNOSIS — I480 Paroxysmal atrial fibrillation: Secondary | ICD-10-CM | POA: Diagnosis not present

## 2023-08-04 DIAGNOSIS — I5032 Chronic diastolic (congestive) heart failure: Secondary | ICD-10-CM

## 2023-08-04 DIAGNOSIS — N1831 Chronic kidney disease, stage 3a: Secondary | ICD-10-CM | POA: Diagnosis not present

## 2023-08-04 DIAGNOSIS — Z1211 Encounter for screening for malignant neoplasm of colon: Secondary | ICD-10-CM

## 2023-08-04 DIAGNOSIS — E66811 Obesity, class 1: Secondary | ICD-10-CM

## 2023-08-04 LAB — URINALYSIS, ROUTINE W REFLEX MICROSCOPIC
Bilirubin, UA: NEGATIVE
Ketones, UA: NEGATIVE
Nitrite, UA: NEGATIVE
RBC, UA: NEGATIVE
Specific Gravity, UA: 1.015 (ref 1.005–1.030)
Urobilinogen, Ur: 0.2 mg/dL (ref 0.2–1.0)
pH, UA: 6 (ref 5.0–7.5)

## 2023-08-04 LAB — MICROSCOPIC EXAMINATION: Bacteria, UA: NONE SEEN

## 2023-08-04 NOTE — Assessment & Plan Note (Signed)
Pt in normal rate/rhythm in office. Patient states she was taken off Amiodarone and Eliquis by Dr. Gwen Pounds.  Will continue to monitor at future visits.  Follow up in 6 months.  Call sooner if concerns arise.

## 2023-08-04 NOTE — Assessment & Plan Note (Signed)
Chronic.  Controlled.  Continue with current medication regimen.  Followed by the HF clinic.  Following up with Cardiology annually.  Now only taking Lasix PRN due to decline in kidney function and Low Potassium.  Hasn't needed to take any for swelling or weight gain.   Labs ordered today.  Return to clinic in 6 months for reevaluation.  Call sooner if concerns arise.   - Reminded to call for an overnight weight gain of >2 pounds or a weekly weight gain of >5 pounds - not adding salt to food and read food labels. Reviewed the importance of keeping daily sodium intake to 2000mg  daily. - Avoid Ibuprofen products.

## 2023-08-04 NOTE — Assessment & Plan Note (Signed)
Chronic. Well controlled. Pt tolerating ozempic. Denies any adverse side effects at this time.  A1c well controlled at 7.4%.   Eye exam and microalbumin up to date.  Continue with current medication regimen of Ozempic and Jardiance.  Follow up in 6 months.  Call sooner if concerns arise.

## 2023-08-04 NOTE — Assessment & Plan Note (Signed)
Labs ordered at visit today.  Will make recommendations based on lab results.   

## 2023-08-04 NOTE — Assessment & Plan Note (Signed)
Labs ordered at visit today.  Will make recommendations based on lab results. Continue with Jardiance.  Creatinine improved with stopping daily lasix.

## 2023-08-04 NOTE — Assessment & Plan Note (Signed)
Chronic.  Controlled.  Blood pressures at home running 120-130/80. Continue with Carvedilol and Entresto.  No changes made to current regimen. Labs ordered today. Will make recommendations based on lab results. Follow up in 6 months.  Call sooner if concerns arise.

## 2023-08-04 NOTE — Assessment & Plan Note (Signed)
Chronic. Lipid panel drawn in clinic today.  Continue with Crestor 5mg  daily. Will increase as patient tolerates the medication.  Follow up in 6 months.  Call sooner if concerns arise.

## 2023-08-05 ENCOUNTER — Other Ambulatory Visit: Payer: Self-pay | Admitting: Physician Assistant

## 2023-08-05 LAB — COMPREHENSIVE METABOLIC PANEL
ALT: 17 [IU]/L (ref 0–32)
AST: 23 [IU]/L (ref 0–40)
Albumin: 4.1 g/dL (ref 3.9–4.9)
Alkaline Phosphatase: 69 [IU]/L (ref 44–121)
BUN/Creatinine Ratio: 12 (ref 12–28)
BUN: 16 mg/dL (ref 8–27)
Bilirubin Total: 0.4 mg/dL (ref 0.0–1.2)
CO2: 22 mmol/L (ref 20–29)
Calcium: 9.5 mg/dL (ref 8.7–10.3)
Chloride: 103 mmol/L (ref 96–106)
Creatinine, Ser: 1.29 mg/dL — ABNORMAL HIGH (ref 0.57–1.00)
Globulin, Total: 2.6 g/dL (ref 1.5–4.5)
Glucose: 148 mg/dL — ABNORMAL HIGH (ref 70–99)
Potassium: 4.3 mmol/L (ref 3.5–5.2)
Sodium: 140 mmol/L (ref 134–144)
Total Protein: 6.7 g/dL (ref 6.0–8.5)
eGFR: 45 mL/min/{1.73_m2} — ABNORMAL LOW (ref >59)

## 2023-08-05 LAB — LIPID PANEL
Chol/HDL Ratio: 2.7 {ratio} (ref 0.0–4.4)
Cholesterol, Total: 149 mg/dL (ref 100–199)
HDL: 56 mg/dL (ref >39)
LDL Chol Calc (NIH): 65 mg/dL (ref 0–99)
Triglycerides: 169 mg/dL — ABNORMAL HIGH (ref 0–149)
VLDL Cholesterol Cal: 28 mg/dL (ref 5–40)

## 2023-08-05 LAB — CBC WITH DIFFERENTIAL/PLATELET
Basophils Absolute: 0 10*3/uL (ref 0.0–0.2)
Basos: 0 %
EOS (ABSOLUTE): 0.2 10*3/uL (ref 0.0–0.4)
Eos: 3 %
Hematocrit: 37.9 % (ref 34.0–46.6)
Hemoglobin: 12.4 g/dL (ref 11.1–15.9)
Immature Grans (Abs): 0 10*3/uL (ref 0.0–0.1)
Immature Granulocytes: 0 %
Lymphocytes Absolute: 1.9 10*3/uL (ref 0.7–3.1)
Lymphs: 33 %
MCH: 32 pg (ref 26.6–33.0)
MCHC: 32.7 g/dL (ref 31.5–35.7)
MCV: 98 fL — ABNORMAL HIGH (ref 79–97)
Monocytes Absolute: 0.4 10*3/uL (ref 0.1–0.9)
Monocytes: 6 %
Neutrophils Absolute: 3.2 10*3/uL (ref 1.4–7.0)
Neutrophils: 58 %
Platelets: 192 10*3/uL (ref 150–450)
RBC: 3.88 x10E6/uL (ref 3.77–5.28)
RDW: 13.5 % (ref 11.7–15.4)
WBC: 5.7 10*3/uL (ref 3.4–10.8)

## 2023-08-05 LAB — HEMOGLOBIN A1C
Est. average glucose Bld gHb Est-mCnc: 157 mg/dL
Hgb A1c MFr Bld: 7.1 % — ABNORMAL HIGH (ref 4.8–5.6)

## 2023-08-05 LAB — VITAMIN B12: Vitamin B-12: >2000 pg/mL — ABNORMAL HIGH (ref 232–1245)

## 2023-08-05 LAB — TSH: TSH: 2.25 u[IU]/mL (ref 0.450–4.500)

## 2023-08-06 NOTE — Telephone Encounter (Signed)
Requested Prescriptions  Pending Prescriptions Disp Refills   Semaglutide, 2 MG/DOSE, (OZEMPIC, 2 MG/DOSE,) 8 MG/3ML SOPN [Pharmacy Med Name: OZEMPIC 2MG  DOSE 8MG /3ML 8 Solution Pen-injector] 3 mL 2    Sig: INJECT 2MG  SUBCUTANEOUSLY ONCE A WEEK     Endocrinology:  Diabetes - GLP-1 Receptor Agonists - semaglutide Failed - 08/05/2023 10:49 PM      Failed - HBA1C in normal range and within 180 days    Hemoglobin A1C  Date Value Ref Range Status  06/12/2016 7.8  Final   HB A1C (BAYER DCA - WAIVED)  Date Value Ref Range Status  01/22/2022 6.9 (H) 4.8 - 5.6 % Final    Comment:             Prediabetes: 5.7 - 6.4          Diabetes: >6.4          Glycemic control for adults with diabetes: <7.0    Hgb A1c MFr Bld  Date Value Ref Range Status  08/04/2023 7.1 (H) 4.8 - 5.6 % Final    Comment:             Prediabetes: 5.7 - 6.4          Diabetes: >6.4          Glycemic control for adults with diabetes: <7.0          Failed - Cr in normal range and within 360 days    Creatinine, Ser  Date Value Ref Range Status  08/04/2023 1.29 (H) 0.57 - 1.00 mg/dL Final         Passed - Valid encounter within last 6 months    Recent Outpatient Visits           2 days ago Annual physical exam   [place] [facility] Larae Grooms, NP   1 month ago Herpes zoster without complication   [place] [facility] Mecum, Erin E, PA-C   4 months ago Hypertension associated with diabetes [facility])   [place], NP   7 months ago Hypertension associated with diabetes [facility])   [place] Larae [facility], NP   10 months ago AF (paroxysmal atrial fibrillation) [facility])   [place] [facility] Larae Grooms, NP       Future Appointments             In 6 months Larae Grooms, NP [place] [name]

## 2023-08-07 ENCOUNTER — Other Ambulatory Visit: Payer: Self-pay | Admitting: Nurse Practitioner

## 2023-08-08 NOTE — Telephone Encounter (Signed)
Requested Prescriptions  Pending Prescriptions Disp Refills   rosuvastatin (CRESTOR) 5 MG tablet [Pharmacy Med Name: ROSUVASTATIN 5MG  TAB 5 Tablet] 30 tablet 10    Sig: TAKE 1 TABLET BY MOUTH ONCE DAILY     Cardiovascular:  Antilipid - Statins 2 Failed - 08/07/2023  7:01 PM      Failed - Cr in normal range and within 360 days    Creatinine, Ser  Date Value Ref Range Status  08/04/2023 1.29 (H) 0.57 - 1.00 mg/dL Final         Failed - Lipid Panel in normal range within the last 12 months    Cholesterol, Total  Date Value Ref Range Status  08/04/2023 149 100 - 199 mg/dL Final   Cholesterol Piccolo, Waived  Date Value Ref Range Status  07/16/2017 196 <200 mg/dL Final    Comment:                            Desirable                <200                         Borderline High      200- 239                         High                     >239    LDL Chol Calc (NIH)  Date Value Ref Range Status  08/04/2023 65 0 - 99 mg/dL Final   HDL  Date Value Ref Range Status  08/04/2023 56 >39 mg/dL Final   Triglycerides  Date Value Ref Range Status  08/04/2023 169 (H) 0 - 149 mg/dL Final   Triglycerides Piccolo,Waived  Date Value Ref Range Status  07/16/2017 301 (H) <150 mg/dL Final    Comment:                            Normal                   <150                         Borderline High     150 - 199                         High                200 - 499                         Very High                >499          Passed - Patient is not pregnant      Passed - Valid encounter within last 12 months    Recent Outpatient Visits           4 days ago Annual physical exam   [facility]   1 month ago Herpes zoster without complication   [place] [facility] Mecum, Erin E, PA-C   4 months ago Hypertension associated with diabetes (HCC)  [place] [facility] Larae Grooms, NP   7 months ago  Hypertension associated with diabetes [facility])   [place] [facility], NP   10 months ago AF (paroxysmal atrial fibrillation) [facility])   [place] [facility] Larae Grooms, NP       Future Appointments             In 5 months Larae Grooms, NP [place] [name]

## 2023-08-11 ENCOUNTER — Other Ambulatory Visit: Payer: Self-pay | Admitting: Nurse Practitioner

## 2023-08-11 NOTE — Progress Notes (Signed)
Hi Ms. Amanda [name]. It was nice to see you last week.  Your lab work looks good.  Your A1c is well controlled at 7.1%.  Your kidney function is stable.  No concerns at this time. Continue with your current medication regimen.  Follow up as discussed.  Please let me know if you have any questions.

## 2023-08-11 NOTE — Telephone Encounter (Signed)
Medication Refill - Medication: carvedilol (COREG) 12.5 MG tablet [[id number]] Semaglutide, 2 MG/DOSE, (OZEMPIC, 2 MG/DOSE,) 8 MG/3ML SOPN [[id number]] spironolactone (ALDACTONE) 25 MG tablet [[id number]] rosuvastatin (CRESTOR) 5 MG tablet [[id number]]   Has the patient contacted their pharmacy? Yes.    (Agent: If yes, when and what did the pharmacy advise?) Contact PCP  Preferred Pharmacy (with phone number or street name): [facility] [address]   Has the patient been seen for an appointment in the last year OR does the patient have an upcoming appointment? Yes.    Agent: Please be advised that RX refills may take up to 3 business days. We ask that you follow-up with your pharmacy

## 2023-08-12 MED ORDER — SPIRONOLACTONE 25 MG PO TABS: 12.5000 mg | ORAL_TABLET | Freq: Every day | ORAL | 1 refills | Status: DC

## 2023-08-12 MED ORDER — ROSUVASTATIN CALCIUM 5 MG PO TABS: 5.0000 mg | ORAL_TABLET | Freq: Every day | ORAL | 3 refills | Status: DC

## 2023-08-12 MED ORDER — OZEMPIC (2 MG/DOSE) 8 MG/3ML ~~LOC~~ SOPN: 2.0000 mg | PEN_INJECTOR | SUBCUTANEOUS | 2 refills | Status: DC

## 2023-08-12 MED ORDER — CARVEDILOL 12.5 MG PO TABS: 12.5000 mg | ORAL_TABLET | Freq: Two times a day (BID) | ORAL | 1 refills | Status: DC

## 2023-08-12 NOTE — Telephone Encounter (Signed)
Requested Prescriptions  Pending Prescriptions Disp Refills   carvedilol (COREG) 12.5 MG tablet 180 tablet 1    Sig: Take 1 tablet (12.5 mg total) by mouth 2 (two) times daily with a meal.     Cardiovascular: Beta Blockers 3 Failed - 08/11/2023 11:13 AM      Failed - Cr in normal range and within 360 days    Creatinine, Ser  Date Value Ref Range Status  08/04/2023 1.29 (H) 0.57 - 1.00 mg/dL Final         Passed - AST in normal range and within 360 days    AST  Date Value Ref Range Status  08/04/2023 23 0 - 40 IU/L Final   AST (SGOT) Piccolo, Waived  Date Value Ref Range Status  07/16/2017 28 11 - 38 U/L Final         Passed - ALT in normal range and within 360 days    ALT  Date Value Ref Range Status  08/04/2023 17 0 - 32 IU/L Final   ALT (SGPT) Piccolo, Waived  Date Value Ref Range Status  07/16/2017 33 10 - 47 U/L Final         Passed - Last BP in normal range    BP Readings from Last 1 Encounters:  08/04/23 115/72         Passed - Last Heart Rate in normal range    Pulse Readings from Last 1 Encounters:  08/04/23 80         Passed - Valid encounter within last 6 months    Recent Outpatient Visits           1 week ago Annual physical exam   [place] [facility] Larae Grooms, NP   1 month ago Herpes zoster without complication   [place] [facility] Mecum, Oswaldo Conroy, PA-C   4 months ago Hypertension associated with diabetes [facility])   [place] [facility] Grooms, NP   7 months ago Hypertension associated with diabetes [facility])   [place] [facility] Larae Grooms, NP   10 months ago AF (paroxysmal atrial fibrillation) [facility])   [place] [facility] Larae Grooms, NP       Future Appointments             In 5 months Larae Grooms, NP [place] [name]             Semaglutide, 2 MG/DOSE, (OZEMPIC, 2 MG/DOSE,) 8 MG/3ML SOPN 3 mL 2      Endocrinology:  Diabetes - GLP-1 Receptor Agonists - semaglutide Failed - 08/11/2023 11:13 AM      Failed - HBA1C in normal range and within 180 days    Hemoglobin A1C  Date Value Ref Range Status  06/12/2016 7.8  Final   HB A1C (BAYER DCA - WAIVED)  Date Value Ref Range Status  01/22/2022 6.9 (H) 4.8 - 5.6 % Final    Comment:             Prediabetes: 5.7 - 6.4          Diabetes: >6.4          Glycemic control for adults with diabetes: <7.0    Hgb A1c MFr Bld  Date Value Ref Range Status  08/04/2023 7.1 (H) 4.8 - 5.6 % Final    Comment:             Prediabetes: 5.7 - 6.4  Diabetes: >6.4          Glycemic control for adults with diabetes: <7.0          Failed - Cr in normal range and within 360 days    Creatinine, Ser  Date Value Ref Range Status  08/04/2023 1.29 (H) 0.57 - 1.00 mg/dL Final         Passed - Valid encounter within last 6 months    Recent Outpatient Visits           1 week ago Annual physical exam   [place] [facility] Lar[facility], NP   1 month ago Herpes zoster without complication   [place] [facility] Mecum, Erin E, PA-C   4 months ago Hypertension associated with diabetes [facility])   [place] [facility] Larae Grooms, NP   7 months ago Hypertension associated with diabetes [facility])   [place] [facility] Lar[facility] Grooms, NP   10 months ago AF (paroxysmal atrial fibrillation) [facility])   [place] [facility] Larae Grooms, NP       Future Appointments             In 5 months Larae Grooms, NP [place] [name]             spironolactone (ALDACTONE) 25 MG tablet 90 tablet 3    Sig: Take 0.5 tablets (12.5 mg total) by mouth daily.     Cardiovascular: Diuretics - Aldosterone Antagonist Failed - 08/11/2023 11:13 AM      Failed - Cr in normal range and within 180 days    Creatinine, Ser  Date Value Ref Range  Status  08/04/2023 1.29 (H) 0.57 - 1.00 mg/dL Final         Passed - K in normal range and within 180 days    Potassium  Date Value Ref Range Status  08/04/2023 4.3 3.5 - 5.2 mmol/L Final         Passed - Na in normal range and within 180 days    Sodium  Date Value Ref Range Status  08/04/2023 140 134 - 144 mmol/L Final         Passed - eGFR is 30 or above and within 180 days    GFR calc Af Amer  Date Value Ref Range Status  09/05/2020 59 (L) >59 mL/min/1.73 Final    Comment:    **In accordance with recommendations from the NKF-ASN Task force,**   [facility] is in the process of updating its eGFR calculation to the   2021 CKD-EPI creatinine equation that estimates kidney function   without a race variable.    GFR, Estimated  Date Value Ref Range Status  06/30/2023 >60 >60 mL/min Final    Comment:    (NOTE) Calculated using the CKD-EPI Creatinine Equation (2021)    eGFR  Date Value Ref Range Status  08/04/2023 45 (L) >59 mL/min/1.73 Final         Passed - Last BP in normal range    BP Readings from Last 1 Encounters:  08/04/23 115/72         Passed - Valid encounter within last 6 months    Recent Outpatient Visits           1 week ago Annual physical exam   [facility], NP   1 month ago Herpes zoster without complication   [place] [facility] Mecum, Oswaldo Conroy,  PA-C   4 months ago Hypertension associated with diabetes [facility])   [place] [facility] Larae Grooms, NP   7 months ago Hypertension associated with diabetes [facility])   [place] [facility] Lar[facility] Grooms, NP   10 months ago AF (paroxysmal atrial fibrillation) [facility])   [place] [facility] Larae Grooms, NP       Future Appointments             In 5 months Larae Grooms, NP [place] [name]             rosuvastatin (CRESTOR) 5 MG tablet 90 tablet 3     Sig: Take 1 tablet (5 mg total) by mouth daily.     Cardiovascular:  Antilipid - Statins 2 Failed - 08/11/2023 11:13 AM      Failed - Cr in normal range and within 360 days    Creatinine, Ser  Date Value Ref Range Status  08/04/2023 1.29 (H) 0.57 - 1.00 mg/dL Final         Failed - Lipid Panel in normal range within the last 12 months    Cholesterol, Total  Date Value Ref Range Status  08/04/2023 149 100 - 199 mg/dL Final   Cholesterol Piccolo, Waived  Date Value Ref Range Status  07/16/2017 196 <200 mg/dL Final    Comment:                            Desirable                <200                         Borderline High      200- 239                         High                     >239    LDL Chol Calc (NIH)  Date Value Ref Range Status  08/04/2023 65 0 - 99 mg/dL Final   HDL  Date Value Ref Range Status  08/04/2023 56 >39 mg/dL Final   Triglycerides  Date Value Ref Range Status  08/04/2023 169 (H) 0 - 149 mg/dL Final   Triglycerides Piccolo,Waived  Date Value Ref Range Status  07/16/2017 301 (H) <150 mg/dL Final    Comment:                            Normal                   <150                         Borderline High     150 - 199                         High                200 - 499                         Very High                >499  Passed - Patient is not pregnant      Passed - Valid encounter within last 12 months    Recent Outpatient Visits           1 week ago Annual physical exam   [place] [facility] Larae Grooms, NP   1 month ago Herpes zoster without complication   [place] [facility] Mecum, Erin E, PA-C   4 months ago Hypertension associated with diabetes [facility])   [place] [facility] Lar[facility] Grooms, NP   7 months ago Hypertension associated with diabetes [facility])   [place] [facility] G[facility], NP   10 months ago AF (paroxysmal atrial fibrillation)  [facility])   [place] [facility] Larae Grooms, NP       Future Appointments             In 5 months Larae Grooms, NP [place] [name]

## 2023-08-12 NOTE — Telephone Encounter (Signed)
Requested medications are due for refill today.  yes  Requested medications are on the active medications list.  yes  Last refill. 03/07/2023 #90 3 rf  Future visit scheduled.   yes  Notes to clinic.  Rx was written to expire 06/05/2023 Rx is expired. Please review for refill. Pt is requesting rx be sent to a different pharmacy.    Requested Prescriptions  Pending Prescriptions Disp Refills   spironolactone (ALDACTONE) 25 MG tablet 90 tablet 3    Sig: Take 0.5 tablets (12.5 mg total) by mouth daily.     Cardiovascular: Diuretics - Aldosterone Antagonist Failed - 08/11/2023 11:13 AM      Failed - Cr in normal range and within 180 days    Creatinine, Ser  Date Value Ref Range Status  08/04/2023 1.29 (H) 0.57 - 1.00 mg/dL Final         Passed - K in normal range and within 180 days    Potassium  Date Value Ref Range Status  08/04/2023 4.3 3.5 - 5.2 mmol/L Final         Passed - Na in normal range and within 180 days    Sodium  Date Value Ref Range Status  08/04/2023 140 134 - 144 mmol/L Final         Passed - eGFR is 30 or above and within 180 days    GFR calc Af Amer  Date Value Ref Range Status  09/05/2020 59 (L) >59 mL/min/1.73 Final    Comment:    **In accordance with recommendations from the NKF-ASN Task force,**   [facility] is in the process of updating its eGFR calculation to the   2021 CKD-EPI creatinine equation that estimates kidney function   without a race variable.    GFR, Estimated  Date Value Ref Range Status  06/30/2023 >60 >60 mL/min Final    Comment:    (NOTE) Calculated using the CKD-EPI Creatinine Equation (2021)    eGFR  Date Value Ref Range Status  08/04/2023 45 (L) >59 mL/min/1.73 Final         Passed - Last BP in normal range    BP Readings from Last 1 Encounters:  08/04/23 115/72         Passed - Valid encounter within last 6 months    Recent Outpatient Visits           1 week ago Annual physical exam   [place] [facility] Lar[facility], NP   1 month ago Herpes zoster without complication   [place] [facility] Mecum, Erin E, PA-C   4 months ago Hypertension associated with diabetes [facility])   [place] [facility] Lar[facility] Grooms, NP   7 months ago Hypertension associated with diabetes [facility])   [place] [facility] Larae Grooms, NP   10 months ago AF (paroxysmal atrial fibrillation) [facility])   [place] [facility] Larae Grooms, NP       Future Appointments             In 5 months Larae Grooms, NP  [name]            Signed Prescriptions Disp Refills   carvedilol (COREG) 12.5 MG tablet 180 tablet 1    Sig: Take 1 tablet (12.5 mg total) by mouth 2 (two) times daily with a meal.     Cardiovascular: Beta Blockers 3 Failed - 08/11/2023 11:13 AM  Failed - Cr in normal range and within 360 days    Creatinine, Ser  Date Value Ref Range Status  08/04/2023 1.29 (H) 0.57 - 1.00 mg/dL Final         Passed - AST in normal range and within 360 days    AST  Date Value Ref Range Status  08/04/2023 23 0 - 40 IU/L Final   AST (SGOT) Piccolo, Waived  Date Value Ref Range Status  07/16/2017 28 11 - 38 U/L Final         Passed - ALT in normal range and within 360 days    ALT  Date Value Ref Range Status  08/04/2023 17 0 - 32 IU/L Final   ALT (SGPT) Piccolo, Waived  Date Value Ref Range Status  07/16/2017 33 10 - 47 U/L Final         Passed - Last BP in normal range    BP Readings from Last 1 Encounters:  08/04/23 115/72         Passed - Last Heart Rate in normal range    Pulse Readings from Last 1 Encounters:  08/04/23 80         Passed - Valid encounter within last 6 months    Recent Outpatient Visits           1 week ago Annual physical exam   [place] [facility], NP   1 month ago Herpes zoster without complication    [place] [facility] Mecum, Erin E, PA-C   4 months ago Hypertension associated with diabetes [facility])   [place] [facility] Larae Grooms, NP   7 months ago Hypertension associated with diabetes [facility])   [place] [facility] Larae Grooms, NP   10 months ago AF (paroxysmal atrial fibrillation) [facility])   [place] [facility] Larae Grooms, NP       Future Appointments             In 5 months Larae Grooms, NP [place] [name]             Semaglutide, 2 MG/DOSE, (OZEMPIC, 2 MG/DOSE,) 8 MG/3ML SOPN 3 mL 2    Sig: Inject 2 mg into the skin once a week.     Endocrinology:  Diabetes - GLP-1 Receptor Agonists - semaglutide Failed - 08/11/2023 11:13 AM      Failed - HBA1C in normal range and within 180 days    Hemoglobin A1C  Date Value Ref Range Status  06/12/2016 7.8  Final   HB A1C (BAYER DCA - WAIVED)  Date Value Ref Range Status  01/22/2022 6.9 (H) 4.8 - 5.6 % Final    Comment:             Prediabetes: 5.7 - 6.4          Diabetes: >6.4          Glycemic control for adults with diabetes: <7.0    Hgb A1c MFr Bld  Date Value Ref Range Status  08/04/2023 7.1 (H) 4.8 - 5.6 % Final    Comment:             Prediabetes: 5.7 - 6.4          Diabetes: >6.4          Glycemic control for adults with diabetes: <7.0          Failed - Cr in normal range and within  360 days    Creatinine, Ser  Date Value Ref Range Status  08/04/2023 1.29 (H) 0.57 - 1.00 mg/dL Final         Passed - Valid encounter within last 6 months    Recent Outpatient Visits           1 week ago Annual physical exam   [place] [facility] G[facility], NP   1 month ago Herpes zoster without complication   [place] [facility] Mecum, Erin E, PA-C   4 months ago Hypertension associated with diabetes [facility])   [place] [facility] Lar[facility] Grooms, NP    7 months ago Hypertension associated with diabetes [facility])   [place] [facility] Larae Grooms, NP   10 months ago AF (paroxysmal atrial fibrillation) [facility]   [place] [facility] Larae Grooms, NP       Future Appointments             In 5 months Larae Grooms, NP [place] [name]             rosuvastatin (CRESTOR) 5 MG tablet 90 tablet 3    Sig: Take 1 tablet (5 mg total) by mouth daily.     Cardiovascular:  Antilipid - Statins 2 Failed - 08/11/2023 11:13 AM      Failed - Cr in normal range and within 360 days    Creatinine, Ser  Date Value Ref Range Status  08/04/2023 1.29 (H) 0.57 - 1.00 mg/dL Final         Failed - Lipid Panel in normal range within the last 12 months    Cholesterol, Total  Date Value Ref Range Status  08/04/2023 149 100 - 199 mg/dL Final   Cholesterol Piccolo, Waived  Date Value Ref Range Status  07/16/2017 196 <200 mg/dL Final    Comment:                            Desirable                <200                         Borderline High      200- 239                         High                     >239    LDL Chol Calc (NIH)  Date Value Ref Range Status  08/04/2023 65 0 - 99 mg/dL Final   HDL  Date Value Ref Range Status  08/04/2023 56 >39 mg/dL Final   Triglycerides  Date Value Ref Range Status  08/04/2023 169 (H) 0 - 149 mg/dL Final   Triglycerides Piccolo,Waived  Date Value Ref Range Status  07/16/2017 301 (H) <150 mg/dL Final    Comment:                            Normal                   <150                         Borderline High  150 - 199                         High                200 - 499                         Very High                >499          Passed - Patient is not pregnant      Passed - Valid encounter within last 12 months    Recent Outpatient Visits           1 week ago Annual physical exam   [place] [facility]  Larae Grooms, NP   1 month ago Herpes zoster without complication   [place] [facility] Mecum, Erin E, PA-C   4 months ago Hypertension associated with diabetes [facility])   [place] [facility] Lar[facility] G[facility], NP   7 months ago Hypertension associated with diabetes [facility])   [place] [facility] Lar[facility] G[facility], NP   10 months ago AF (paroxysmal atrial fibrillation) [facility])   [place] [facility] Larae Grooms, NP       Future Appointments             In 5 months Larae Grooms, NP [place] [name]

## 2023-08-13 DIAGNOSIS — I5032 Chronic diastolic (congestive) heart failure: Secondary | ICD-10-CM | POA: Diagnosis not present

## 2023-08-13 DIAGNOSIS — I480 Paroxysmal atrial fibrillation: Secondary | ICD-10-CM | POA: Diagnosis not present

## 2023-08-13 DIAGNOSIS — I10 Essential (primary) hypertension: Secondary | ICD-10-CM | POA: Diagnosis not present

## 2023-08-13 DIAGNOSIS — I152 Hypertension secondary to endocrine disorders: Secondary | ICD-10-CM | POA: Diagnosis not present

## 2023-08-13 DIAGNOSIS — E1159 Type 2 diabetes mellitus with other circulatory complications: Secondary | ICD-10-CM | POA: Diagnosis not present

## 2023-08-18 ENCOUNTER — Emergency Department: Payer: Medicare HMO

## 2023-08-18 ENCOUNTER — Emergency Department
Admission: EM | Admit: 2023-08-18 | Discharge: 2023-08-18 | Disposition: A | Discharge status: Home / Self Care | Payer: Medicare HMO | Attending: Emergency Medicine | Admitting: Emergency Medicine

## 2023-08-18 ENCOUNTER — Other Ambulatory Visit: Payer: Self-pay

## 2023-08-18 ENCOUNTER — Encounter: Payer: Self-pay | Admitting: Emergency Medicine

## 2023-08-18 DIAGNOSIS — I110 Hypertensive heart disease with heart failure: Secondary | ICD-10-CM | POA: Insufficient documentation

## 2023-08-18 DIAGNOSIS — S0081XA Abrasion of other part of head, initial encounter: Secondary | ICD-10-CM | POA: Insufficient documentation

## 2023-08-18 DIAGNOSIS — I509 Heart failure, unspecified: Secondary | ICD-10-CM | POA: Diagnosis not present

## 2023-08-18 DIAGNOSIS — Y990 Civilian activity done for income or pay: Secondary | ICD-10-CM | POA: Insufficient documentation

## 2023-08-18 DIAGNOSIS — E119 Type 2 diabetes mellitus without complications: Secondary | ICD-10-CM | POA: Insufficient documentation

## 2023-08-18 DIAGNOSIS — W01198A Fall on same level from slipping, tripping and stumbling with subsequent striking against other object, initial encounter: Secondary | ICD-10-CM | POA: Diagnosis not present

## 2023-08-18 DIAGNOSIS — R519 Headache, unspecified: Secondary | ICD-10-CM | POA: Insufficient documentation

## 2023-08-18 DIAGNOSIS — S0990XA Unspecified injury of head, initial encounter: Secondary | ICD-10-CM | POA: Diagnosis present

## 2023-08-18 DIAGNOSIS — W19XXXA Unspecified fall, initial encounter: Secondary | ICD-10-CM

## 2023-08-18 DIAGNOSIS — Z0389 Encounter for observation for other suspected diseases and conditions ruled out: Secondary | ICD-10-CM | POA: Diagnosis not present

## 2023-08-18 LAB — CBC WITH DIFFERENTIAL/PLATELET
Abs Immature Granulocytes: 0.02 10*3/uL (ref 0.00–0.07)
Basophils Absolute: 0 10*3/uL (ref 0.0–0.1)
Basophils Relative: 0 %
Eosinophils Absolute: 0.2 10*3/uL (ref 0.0–0.5)
Eosinophils Relative: 2 %
HCT: 42.5 % (ref 36.0–46.0)
Hemoglobin: 14.3 g/dL (ref 12.0–15.0)
Immature Granulocytes: 0 %
Lymphocytes Relative: 27 %
Lymphs Abs: 2.1 10*3/uL (ref 0.7–4.0)
MCH: 31.8 pg (ref 26.0–34.0)
MCHC: 33.6 g/dL (ref 30.0–36.0)
MCV: 94.7 fL (ref 80.0–100.0)
Monocytes Absolute: 0.4 10*3/uL (ref 0.1–1.0)
Monocytes Relative: 6 %
Neutro Abs: 5 10*3/uL (ref 1.7–7.7)
Neutrophils Relative %: 65 %
Platelets: 206 10*3/uL (ref 150–400)
RBC: 4.49 MIL/uL (ref 3.87–5.11)
RDW: 13.1 % (ref 11.5–15.5)
WBC: 7.8 10*3/uL (ref 4.0–10.5)
nRBC: 0 % (ref 0.0–0.2)

## 2023-08-18 LAB — BASIC METABOLIC PANEL
Anion gap: 9 (ref 5–15)
BUN: 19 mg/dL (ref 8–23)
CO2: 25 mmol/L (ref 22–32)
Calcium: 9.7 mg/dL (ref 8.9–10.3)
Chloride: 103 mmol/L (ref 98–111)
Creatinine, Ser: 1.09 mg/dL — ABNORMAL HIGH (ref 0.44–1.00)
GFR, Estimated: 56 mL/min — ABNORMAL LOW (ref >60)
Glucose, Bld: 127 mg/dL — ABNORMAL HIGH (ref 70–99)
Potassium: 4.3 mmol/L (ref 3.5–5.1)
Sodium: 137 mmol/L (ref 135–145)

## 2023-08-18 MED ORDER — IOHEXOL 300 MG/ML  SOLN
75.0000 mL | Freq: Once | INTRAMUSCULAR | Status: AC | PRN
  Administered 2023-08-18: 75 mL via INTRAVENOUS

## 2023-08-18 NOTE — ED Provider Notes (Signed)
[facility] Provider Note    Event Date/Time   First MD Initiated Contact with Patient 08/18/23 1615     (approximate)   History   Fall   HPI  Amanda [name] is a 67 y.o. female with PMH of hypertension, A-fib, diabetes, arthritis, anxiety, CHF who presents for evaluation after a fall.  Patient was getting ready for lunch at work when she tripped over a chair leg falling forward on the floor hitting her head and face.  She did not lose consciousness but does take blood thinners.  She reports pain to her forehead, upper lip and teeth.     Physical Exam   Triage Vital Signs: ED Triage Vitals [08/18/23 1246]  Encounter Vitals Group     BP (!) 181/98     Systolic BP Percentile      Diastolic BP Percentile      Pulse Rate 77     Resp 18     Temp 98.2 F (36.8 C)     Temp Source Oral     SpO2 100 %     Weight 179 lb (81.2 kg)     Height 5\' 1"  (1.549 m)     Head Circumference      Peak Flow      Pain Score 4     Pain Loc      Pain Education      Exclude from Growth Chart     Most recent vital signs: Vitals:   08/18/23 1246 08/18/23 1708  BP: (!) 181/98 (!) 162/84  Pulse: 77 76  Resp: 18 18  Temp: 98.2 F (36.8 C)   SpO2: 100% 100%    General: Awake, no distress.  CV:  Good peripheral perfusion.  RRR. Resp:  Normal effort.  CTAB. Abd:  No distention.  Other:  No focal neurodeficits, PERRL, EOM intact, no ataxia.  Abrasion to patient's forehead and above her upper lip, no lacerations inside the mouth.   ED Results / Procedures / Treatments   Labs (all labs ordered are listed, but only abnormal results are displayed) Labs Reviewed  BASIC METABOLIC PANEL - Abnormal; Notable for the following components:      Result Value   Glucose, Bld 127 (*)    Creatinine, Ser 1.09 (*)    GFR, Estimated 56 (*)    All other components within normal limits  CBC WITH DIFFERENTIAL/PLATELET    RADIOLOGY CT head and neck obtained, I interpreted the  images as well as reviewed the radiologist report which was negative for any acute intracranial abnormalities and negative for cervical spine fractures.  Radiologist did note nonspecific lucent lesions at the inferior endplates of C2, C3 and T1.  They believe this may be osseous hemangiomas but recommended a contrast-enhanced cervical spine CT.  CT spine with contrast is pending.  PROCEDURES:  Critical Care performed: No  Procedures   MEDICATIONS ORDERED IN ED: Medications  iohexol (OMNIPAQUE) 300 MG/ML solution 75 mL (75 mLs Intravenous Contrast Given 08/18/23 1758)     IMPRESSION / MDM / ASSESSMENT AND PLAN / ED COURSE  I reviewed the triage vital signs and the nursing notes.                             67 year old female presents for evaluation after a fall.  Patient was hypertensive in triage otherwise vital signs are stable.  Differential diagnosis includes, but is not limited to,  abrasion, contusion, intracranial hemorrhage, cervical spine fracture, muscle strain.  Patient's presentation is most consistent with acute complicated illness / injury requiring diagnostic workup.  CT head and neck obtained, interpreted the images as well as reviewed the radiologist report which was negative for any acute intracranial abnormalities and traumatic alignment of the cervical spine.  Radiologist did recommend CT cervical spine with contrast for further evaluation of some nonspecific lucent lesions.  CT scan with contrast is pending.  BMP and CBC unremarkable.  If CT scan with contrast is negative patient may be discharged home and can take Tylenol and ibuprofen as needed for her pain.  Care of the patient will be passed off to Dr. Lenard Lance who will make the final diagnosis and plan.     FINAL CLINICAL IMPRESSION(S) / ED DIAGNOSES   Final diagnoses:  Fall, initial encounter     Rx / DC Orders   ED Discharge Orders     None        Note:  This document was prepared  using Dragon voice recognition software and may include unintentional dictation errors.   Cameron Ali, PA-C 08/18/23 1941    Minna Antis, MD 08/18/23 2253

## 2023-08-18 NOTE — ED Triage Notes (Signed)
Patient to ED via POV for a mechanical fall. Pt states she fell face forward after tripping over a chair leg at work. Abrasions on upper lip and right side of forehead. Denies LOC but does take blood thinners per patient. Also stating head and teeth pain.

## 2023-08-18 NOTE — ED Provider Notes (Signed)
-----------------------------------------   8:25 PM on 08/18/2023 ----------------------------------------- Patient care assumed from physician assistant Cruz Condon.  Patient CT scan of the head and C-spine have resulted showing no intracranial injury but there is some lucent lesions on the endplates of C2-C3 and T1.  We will proceed with a CT scan with contrast to further evaluate.  Patient CT scan with contrast has resulted showing no change in the lucencies suspect benign lesions.  They do recommend MRI with and without.  I believe given the reassuring CT with contrast this can be done on a nonemergent basis.  Patient to follow-up with her PCP.  Patient agreeable to plan of care.   Minna Antis, MD 08/18/23 2026

## 2023-08-18 NOTE — ED Notes (Signed)
Pt. Up to bathroom indep, gait steady, NAD.

## 2023-08-18 NOTE — Discharge Instructions (Addendum)
You can take 650 mg of Tylenol every 6 hours as needed for pain.  Your workup does show several small lucencies within your cervical spine.  These appear to be benign lesions based on her CT scan with contrast tonight.  However we would recommend that you still follow-up with your doctor for an MRI of your cervical spine in the near future for recheck/reevaluation and to rule out any concerning findings.

## 2023-08-18 NOTE — ED Notes (Signed)
Pt. States she tripped over chair leg this AM and fell and hit her face on concrete floor. Pt. Denies LOC, dizziness, visual disturbances etc.

## 2023-09-04 NOTE — Progress Notes (Signed)
BP 134/83 (BP Location: Left Arm, Patient Position: Sitting, Cuff Size: Large)   Pulse 76   Temp 98.2 F (36.8 C) (Oral)   Ht 5\' 1"  (1.549 m)   Wt 178 lb 6.4 oz (80.9 kg)   SpO2 99%   BMI 33.71 kg/m    Subjective:    Patient ID: Amanda [name], female    DOB: 1956/08/12, 67 y.o.   MRN: [id number]  HPI: Amanda [name] is a 67 y.o. female  Chief Complaint  Patient presents with   Hospitalization Follow-up    Would like to know if you looked over her CT scan and your thoughts on ihe results    Medication Refill    Jardiance, entresto   Patient was seen in the ER on 10/28 after a fall for tripping over a chair leg.  She had imaging done while she was in the ER.  Does need a follow up MRI due to findings on the CT scan.   Denies HA, CP, SOB, dizziness, palpitations, visual changes, and lower extremity swelling.    Relevant past medical, surgical, family and social history reviewed and updated as indicated. Interim medical history since our last visit reviewed. Allergies and medications reviewed and updated.  Review of Systems  Eyes:  Negative for visual disturbance.  Respiratory:  Negative for cough, chest tightness and shortness of breath.   Cardiovascular:  Negative for chest pain, palpitations and leg swelling.  Neurological:  Negative for dizziness and headaches.    Per HPI unless specifically indicated above     Objective:    BP 134/83 (BP Location: Left Arm, Patient Position: Sitting, Cuff Size: Large)   Pulse 76   Temp 98.2 F (36.8 C) (Oral)   Ht 5\' 1"  (1.549 m)   Wt 178 lb 6.4 oz (80.9 kg)   SpO2 99%   BMI 33.71 kg/m   Wt Readings from Last 3 Encounters:  09/05/23 178 lb 6.4 oz (80.9 kg)  08/18/23 179 lb (81.2 kg)  08/04/23 179 lb (81.2 kg)    Physical Exam Vitals and nursing note reviewed.  Constitutional:      General: She is not in acute distress.    Appearance: Normal appearance. She is normal weight. She is not ill-appearing, toxic-appearing or  diaphoretic.  HENT:     Head: Normocephalic.     Right Ear: External ear normal.     Left Ear: External ear normal.     Nose: Nose normal.     Mouth/Throat:     Mouth: Mucous membranes are moist.     Pharynx: Oropharynx is clear.  Eyes:     General:        Right eye: No discharge.        Left eye: No discharge.     Extraocular Movements: Extraocular movements intact.     Conjunctiva/sclera: Conjunctivae normal.     Pupils: Pupils are equal, round, and reactive to light.  Cardiovascular:     Rate and Rhythm: Normal rate and regular rhythm.     Heart sounds: No murmur heard. Pulmonary:     Effort: Pulmonary effort is normal. No respiratory distress.     Breath sounds: Normal breath sounds. No wheezing or rales.  Musculoskeletal:     Cervical back: Normal range of motion and neck supple.  Skin:    General: Skin is warm and dry.     Capillary Refill: Capillary refill takes less than 2 seconds.  Neurological:     General:  No focal deficit present.     Mental Status: She is alert and oriented to person, place, and time. Mental status is at baseline.  Psychiatric:        Mood and Affect: Mood normal.        Behavior: Behavior normal.        Thought Content: Thought content normal.        Judgment: Judgment normal.     Results for orders placed or performed during the hospital encounter of 08/18/23  CBC with Differential  Result Value Ref Range   WBC 7.8 4.0 - 10.5 K/uL   RBC 4.49 3.87 - 5.11 MIL/uL   Hemoglobin 14.3 12.0 - 15.0 g/dL   HCT 62.9 52.8 - 41.3 %   MCV 94.7 80.0 - 100.0 fL   MCH 31.8 26.0 - 34.0 pg   MCHC 33.6 30.0 - 36.0 g/dL   RDW 24.4 01.0 - 27.2 %   Platelets 206 150 - 400 K/uL   nRBC 0.0 0.0 - 0.2 %   Neutrophils Relative % 65 %   Neutro Abs 5.0 1.7 - 7.7 K/uL   Lymphocytes Relative 27 %   Lymphs Abs 2.1 0.7 - 4.0 K/uL   Monocytes Relative 6 %   Monocytes Absolute 0.4 0.1 - 1.0 K/uL   Eosinophils Relative 2 %   Eosinophils Absolute 0.2 0.0 - 0.5 K/uL    Basophils Relative 0 %   Basophils Absolute 0.0 0.0 - 0.1 K/uL   Immature Granulocytes 0 %   Abs Immature Granulocytes 0.02 0.00 - 0.07 K/uL  Basic metabolic panel  Result Value Ref Range   Sodium 137 135 - 145 mmol/L   Potassium 4.3 3.5 - 5.1 mmol/L   Chloride 103 98 - 111 mmol/L   CO2 25 22 - 32 mmol/L   Glucose, Bld 127 (H) 70 - 99 mg/dL   BUN 19 8 - 23 mg/dL   Creatinine, Ser 5.36 (H) 0.44 - 1.00 mg/dL   Calcium 9.7 8.9 - 64.4 mg/dL   GFR, Estimated 56 (L) >60 mL/min   Anion gap 9 5 - 15      Assessment & Plan:   Problem List Items Addressed This Visit       Cardiovascular and Mediastinum   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  Recommended a follow up MRI w/ and w/out.  Also repeated CMP at visit today.  Will await results of imaging and labs for further recommendations.      Relevant Medications   sacubitril-valsartan (ENTRESTO) 97-103 MG   Other Relevant Orders   MR CERVICAL SPINE W WO CONTRAST   Comp Met (CMET)     Follow up plan: No follow-ups on file.

## 2023-09-05 ENCOUNTER — Encounter: Payer: Self-pay | Admitting: Nurse Practitioner

## 2023-09-05 ENCOUNTER — Ambulatory Visit (INDEPENDENT_AMBULATORY_CARE_PROVIDER_SITE_OTHER): Payer: Medicare HMO | Admitting: Nurse Practitioner

## 2023-09-05 VITALS — BP 134/83 | HR 76 | Temp 98.2°F | Ht 61.0 in | Wt 178.4 lb

## 2023-09-05 DIAGNOSIS — D1802 Hemangioma of intracranial structures: Secondary | ICD-10-CM

## 2023-09-05 MED ORDER — EMPAGLIFLOZIN 10 MG PO TABS: 10.0000 mg | ORAL_TABLET | Freq: Every day | ORAL | 1 refills | Status: DC

## 2023-09-05 MED ORDER — ENTRESTO 97-103 MG PO TABS: 1.0000 | ORAL_TABLET | Freq: Two times a day (BID) | ORAL | 3 refills | Status: DC

## 2023-09-05 NOTE — Assessment & Plan Note (Signed)
Found on CT in the ER.  Recommended a follow up MRI w/ and w/out.  Also repeated CMP at visit today.  Will await results of imaging and labs for further recommendations.

## 2023-09-06 LAB — COMPREHENSIVE METABOLIC PANEL
ALT: 15 [IU]/L (ref 0–32)
AST: 23 [IU]/L (ref 0–40)
Albumin: 4.3 g/dL (ref 3.9–4.9)
Alkaline Phosphatase: 79 [IU]/L (ref 44–121)
BUN/Creatinine Ratio: 18 (ref 12–28)
BUN: 20 mg/dL (ref 8–27)
Bilirubin Total: 0.5 mg/dL (ref 0.0–1.2)
CO2: 22 mmol/L (ref 20–29)
Calcium: 9.4 mg/dL (ref 8.7–10.3)
Chloride: 104 mmol/L (ref 96–106)
Creatinine, Ser: 1.13 mg/dL — ABNORMAL HIGH (ref 0.57–1.00)
Globulin, Total: 2.4 g/dL (ref 1.5–4.5)
Glucose: 165 mg/dL — ABNORMAL HIGH (ref 70–99)
Potassium: 4.6 mmol/L (ref 3.5–5.2)
Sodium: 140 mmol/L (ref 134–144)
Total Protein: 6.7 g/dL (ref 6.0–8.5)
eGFR: 53 mL/min/{1.73_m2} — ABNORMAL LOW (ref >59)

## 2023-09-12 ENCOUNTER — Ambulatory Visit
Admission: RE | Admit: 2023-09-12 | Discharge: 2023-09-12 | Disposition: A | Discharge status: Home / Self Care | Payer: Medicare HMO | Source: Ambulatory Visit | Attending: Nurse Practitioner | Admitting: Nurse Practitioner

## 2023-09-12 DIAGNOSIS — D1802 Hemangioma of intracranial structures: Secondary | ICD-10-CM | POA: Insufficient documentation

## 2023-09-12 DIAGNOSIS — M50223 Other cervical disc displacement at C6-C7 level: Secondary | ICD-10-CM | POA: Diagnosis not present

## 2023-09-12 DIAGNOSIS — M5134 Other intervertebral disc degeneration, thoracic region: Secondary | ICD-10-CM | POA: Diagnosis not present

## 2023-09-12 DIAGNOSIS — M50222 Other cervical disc displacement at C5-C6 level: Secondary | ICD-10-CM | POA: Diagnosis not present

## 2023-09-12 DIAGNOSIS — M50221 Other cervical disc displacement at C4-C5 level: Secondary | ICD-10-CM | POA: Diagnosis not present

## 2023-09-12 MED ORDER — GADOBUTROL 1 MMOL/ML IV SOLN
7.5000 mL | Freq: Once | INTRAVENOUS | Status: AC | PRN
Start: 2023-09-12 — End: 2023-09-12
  Administered 2023-09-12: 7.5 mL via INTRAVENOUS

## 2023-10-16 ENCOUNTER — Ambulatory Visit: Payer: Medicare HMO | Attending: Cardiology | Admitting: Cardiology

## 2023-10-16 VITALS — BP 163/75 | HR 78 | Wt 181.0 lb

## 2023-10-16 DIAGNOSIS — I5032 Chronic diastolic (congestive) heart failure: Secondary | ICD-10-CM | POA: Diagnosis not present

## 2023-10-16 MED ORDER — SPIRONOLACTONE 25 MG PO TABS: 25.0000 mg | ORAL_TABLET | Freq: Every day | ORAL | 3 refills | Status: DC

## 2023-10-16 NOTE — Patient Instructions (Signed)
INCREASE SPIRONOLACTONE 25 MG ONCE DAILY

## 2023-10-16 NOTE — Progress Notes (Signed)
PCP: Larae Grooms, NP (last seen 06/24) Primary Cardiologist: Arnoldo Hooker, MD (last seen 10/23; returns 10/24)  HPI:  Amanda [name] is a 67 y/o female with a history of DM, hyperlipidemia, HTN, anxiety, atrial fibrillation, GERD, previous tobacco use and chronic heart failure.   Echo 01/05/22: EF of 50-55% along with moderate LAE & moderate MR.   Has overall been doing well since her last visit. She denies any recent fatigue, chest pain, shortness of air with exertion, swelling, dizziness, weight gain. She is tired from Christmas yesterday.   ROS: All systems negative except as listed in HPI, PMH and Problem List.  SH:  Social History   Socioeconomic History   Marital status: Single    Spouse name: Amanda [name]   Number of children: Not on file   Years of education: Not on file   Highest education level: Not on file  Occupational History   Not on file  Tobacco Use   Smoking status: Former    Current packs/day: 0.00    Types: Cigarettes    Quit date: 12/07/1975    Years since quitting: 47.8   Smokeless tobacco: Never  Vaping Use   Vaping status: Never Used  Substance and Sexual Activity   Alcohol use: No   Drug use: No   Sexual activity: Not on file  Other Topics Concern   Not on file  Social History Narrative   Not on file   Social Drivers of Health   Financial Resource Strain: High Risk (12/23/2022)   Overall Financial Resource Strain (CARDIA)    Difficulty of Paying Living Expenses: Hard  Food Insecurity: No Food Insecurity (12/10/2022)   Hunger Vital Sign    Worried About Running Out of Food in the Last Year: Never true    Ran Out of Food in the Last Year: Never true  Transportation Needs: No Transportation Needs (12/10/2022)   PRAPARE - Administrator, Civil Service (Medical): No    Lack of Transportation (Non-Medical): No  Physical Activity: Insufficiently Active (12/10/2022)   Exercise Vital Sign    Days of Exercise per Week: 3 days    Minutes of  Exercise per Session: 30 min  Stress: No Stress Concern Present (12/10/2022)   [employer] of Occupational Health - Occupational Stress Questionnaire    Feeling of Stress : Not at all  Social Connections: Socially Isolated (12/10/2022)   Social Connection and Isolation Panel [NHANES]    Frequency of Communication with Friends and Family: More than three times a week    Frequency of Social Gatherings with Friends and Family: Twice a week    Attends Religious Services: Never    Database administrator or Organizations: No    Attends Banker Meetings: Never    Marital Status: Never married  Intimate Partner Violence: Not At Risk (12/10/2022)   Humiliation, Afraid, Rape, and Kick questionnaire    Fear of Current or Ex-Partner: No    Emotionally Abused: No    Physically Abused: No    Sexually Abused: No    FH:  Family History  Problem Relation Age of Onset   COPD Mother    Hypertension Mother    Diabetes Mother    Severe combined immunodeficiency Father    Diabetes Sister    Heart disease Sister    Heart disease Sister    Cancer Niece     Past Medical History:  Diagnosis Date   Anxiety    Arthritis    Atrial  fibrillation (HCC)    CHF (congestive heart failure) (HCC)    Cholecystitis    Cholecystitis    Diabetes mellitus without complication (HCC)    Dyspnea    GERD (gastroesophageal reflux disease)    Hyperlipidemia    Hypertension     Current Outpatient Medications  Medication Sig Dispense Refill   albuterol (VENTOLIN HFA) 108 (90 Base) MCG/ACT inhaler Inhale 2 puffs into the lungs every 6 (six) hours as needed for wheezing or shortness of breath. 8 g 0   Calcium Carb-Cholecalciferol (CALCIUM 600+D3) 600-200 MG-UNIT TABS Take 1 tablet by mouth in the morning.     carvedilol (COREG) 12.5 MG tablet Take 1 tablet (12.5 mg total) by mouth 2 (two) times daily with a meal. 180 tablet 1   empagliflozin (JARDIANCE) 10 MG TABS tablet Take 1 tablet (10 mg total)  by mouth daily before breakfast. 90 tablet 1   ferrous sulfate 325 (65 FE) MG tablet Take 1 tablet (325 mg total) by mouth daily. 30 tablet 3   Multiple Vitamin (MULTIVITAMIN) tablet Take 1 tablet by mouth daily.     omeprazole (PRILOSEC) 20 MG capsule Take 20 mg by mouth daily as needed.     rosuvastatin (CRESTOR) 5 MG tablet Take 1 tablet (5 mg total) by mouth daily. 90 tablet 3   sacubitril-valsartan (ENTRESTO) 97-103 MG Take 1 tablet by mouth 2 (two) times daily. 60 tablet 3   Semaglutide, 2 MG/DOSE, (OZEMPIC, 2 MG/DOSE,) 8 MG/3ML SOPN Inject 2 mg into the skin once a week. 3 mL 2   spironolactone (ALDACTONE) 25 MG tablet Take 1 tablet (25 mg total) by mouth daily. 90 tablet 3   vitamin B-12 (CYANOCOBALAMIN) 500 MCG tablet Take 500 mcg by mouth daily.     furosemide (LASIX) 20 MG tablet TAKE 1 TABLET BY MOUTH ONCE DAILY *REFILL REQUEST* (Patient not taking: Reported on 10/16/2023) 90 tablet 0   potassium chloride SA (KLOR-CON M) 20 MEQ tablet TAKE ONE TABLET BY MOUTH EVERY MORNING (Patient not taking: Reported on 10/16/2023) 30 tablet 3   No current facility-administered medications for this visit.   Vitals:   10/16/23 0940  BP: (!) 163/75  Pulse: 78  SpO2: 100%  Weight: 181 lb (82.1 kg)   Wt Readings from Last 3 Encounters:  10/16/23 181 lb (82.1 kg)  09/05/23 178 lb 6.4 oz (80.9 kg)  08/18/23 179 lb (81.2 kg)   Lab Results  Component Value Date   CREATININE 1.13 (H) 09/05/2023   CREATININE 1.09 (H) 08/18/2023   CREATININE 1.29 (H) 08/04/2023    PHYSICAL EXAM:  General:  Well appearing. No resp difficulty HEENT: normal Neck: supple. JVP flat. No lymphadenopathy or thryomegaly appreciated. Cor: PMI normal. Regular rate & rhythm. No rubs, gallops or murmurs. Lungs: clear Abdomen: soft, nontender, nondistended. No hepatosplenomegaly. No bruits or masses.  Extremities: no cyanosis, clubbing, rash, edema Neuro: alert & orientedx3, cranial nerves grossly intact. Moves all 4  extremities w/o difficulty. Affect pleasant.   ECG: not done   ASSESSMENT & PLAN:  1: NICM with preserved ejection fraction- - likely HTN/ AF - NYHA class II - euvolemic - weighing daily; reminded to call for an overnight weight gain of > 2 pounds or a weekly weight gain of > 5 pounds - Echo 01/05/22: EF of 50-55% along with moderate LAE & moderate MR; repeat ordered for next visit. -sodium intake to ~ 2000mg  / day - continue jardiance 10mg  daily - continue carvedilol 12.5mg  BID - continue entresto  97/103mg  BID - Increase spironolactone to 25mg  daily - Patient has not been taking lasix - BNP 01/04/22 was 537.6  2: HTN- - BP 163/75, but reports BP in the 130s at home - saw PCP Caren Griffins) 06/24 - recheck BMP at next visit  3: DM- - A1c 04/03/23 was 7.4% - PCP increased ozempic to 2mg  weekly due to increased A1c; patient doesn't report any issues with increased dose  4: Atrial fibrillation- - in NSR today. Continue carvedilol 12.5mg  BID -Taken on eliquis by general cardiology  Return in 6 months, sooner if needed.

## 2023-10-17 ENCOUNTER — Encounter: Payer: Medicare HMO | Admitting: Family

## 2023-12-04 ENCOUNTER — Other Ambulatory Visit: Payer: Self-pay | Admitting: Nurse Practitioner

## 2023-12-05 NOTE — Telephone Encounter (Signed)
Requested Prescriptions  Pending Prescriptions Disp Refills   OZEMPIC, 2 MG/DOSE, 8 MG/3ML SOPN [Pharmacy Med Name: Ozempic (2 MG/DOSE) 8 MG/3ML Subcutaneous Solution Pen-injector] 3 mL 0    Sig: INJECT 2 MG INTO THE SKIN ONCE A WEEK     Endocrinology:  Diabetes - GLP-1 Receptor Agonists - semaglutide Failed - 12/05/2023  2:50 PM      Failed - HBA1C in normal range and within 180 days    Hemoglobin A1C  Date Value Ref Range Status  06/12/2016 7.8  Final   HB A1C (BAYER DCA - WAIVED)  Date Value Ref Range Status  01/22/2022 6.9 (H) 4.8 - 5.6 % Final    Comment:             Prediabetes: 5.7 - 6.4          Diabetes: >6.4          Glycemic control for adults with diabetes: <7.0    Hgb A1c MFr Bld  Date Value Ref Range Status  08/04/2023 7.1 (H) 4.8 - 5.6 % Final    Comment:             Prediabetes: 5.7 - 6.4          Diabetes: >6.4          Glycemic control for adults with diabetes: <7.0          Failed - Cr in normal range and within 360 days    Creatinine, Ser  Date Value Ref Range Status  09/05/2023 1.13 (H) 0.57 - 1.00 mg/dL Final         Passed - Valid encounter within last 6 months    Recent Outpatient Visits           3 months ago Hemangioma of intracranial structure [facility])   [place] [facility] Larae Grooms, NP   4 months ago Annual physical exam   [place] [facility] Larae Grooms, NP   5 months ago Herpes zoster without complication   [place] [facility] Mecum, Erin E, PA-C   8 months ago Hypertension associated with diabetes [facility])   [place] [facility] Larae Grooms, NP   11 months ago Hypertension associated with diabetes [facility])   [place] [facility] [facility] Larae Grooms, NP       Future Appointments             In 2 months Larae Grooms, NP [place] [name]

## 2023-12-28 ENCOUNTER — Other Ambulatory Visit: Payer: Self-pay | Admitting: Nurse Practitioner

## 2023-12-30 NOTE — Telephone Encounter (Signed)
 Requested Prescriptions  Pending Prescriptions Disp Refills   OZEMPIC, 2 MG/DOSE, 8 MG/3ML SOPN [Pharmacy Med Name: Ozempic (2 MG/DOSE) 8 MG/3ML Subcutaneous Solution Pen-injector] 3 mL 0    Sig: INJECT 2MG  SUBCUTANEOUSLY ONCE A WEEK     Endocrinology:  Diabetes - GLP-1 Receptor Agonists - semaglutide Failed - 12/30/2023  9:08 AM      Failed - HBA1C in normal range and within 180 days    Hemoglobin A1C  Date Value Ref Range Status  06/12/2016 7.8  Final   HB A1C (BAYER DCA - WAIVED)  Date Value Ref Range Status  01/22/2022 6.9 (H) 4.8 - 5.6 % Final    Comment:             Prediabetes: 5.7 - 6.4          Diabetes: >6.4          Glycemic control for adults with diabetes: <7.0    Hgb A1c MFr Bld  Date Value Ref Range Status  08/04/2023 7.1 (H) 4.8 - 5.6 % Final    Comment:             Prediabetes: 5.7 - 6.4          Diabetes: >6.4          Glycemic control for adults with diabetes: <7.0          Failed - Cr in normal range and within 360 days    Creatinine, Ser  Date Value Ref Range Status  09/05/2023 1.13 (H) 0.57 - 1.00 mg/dL Final         Passed - Valid encounter within last 6 months    Recent Outpatient Visits           3 months ago Hemangioma of intracranial structure [facility])   [place] [facility] Larae Grooms, NP   4 months ago Annual physical exam   [place] [facility] Larae Grooms, NP   6 months ago Herpes zoster without complication   [place] [facility] Mecum, Erin E, PA-C   9 months ago Hypertension associated with diabetes [facility])   [place] [facility] Larae Grooms, NP   12 months ago Hypertension associated with diabetes [facility])   [place] [facility] Larae Grooms, NP       Future Appointments             In 1 month Larae Grooms, NP [name]

## 2023-12-31 ENCOUNTER — Other Ambulatory Visit: Payer: Self-pay | Admitting: Nurse Practitioner

## 2024-01-01 NOTE — Telephone Encounter (Signed)
 Requested medications are due for refill today.  yes  Requested medications are on the active medications list.  yes  Last refill. 09/05/2023 #60 3 rf  Future visit scheduled.   yes  Notes to clinic.  Medication not assigned to a protocol - please review for refill.    Requested Prescriptions  Pending Prescriptions Disp Refills   ENTRESTO 97-103 MG [Pharmacy Med Name: Sherryll Burger 97-103 MG Oral Tablet] 60 tablet 0    Sig: Take 1 tablet by mouth twice daily     Off-Protocol Failed - 01/01/2024 10:55 AM      Failed - Medication not assigned to a protocol, review manually.      Passed - Valid encounter within last 12 months    Recent Outpatient Visits           3 months ago Hemangioma of intracranial structure [facility])   [place] [facility] Larae Grooms, NP   5 months ago Annual physical exam   [place] [facility] Larae Grooms, NP   6 months ago Herpes zoster without complication   [place] [facility] Mecum, Oswaldo Conroy, PA-C   9 months ago Hypertension associated with diabetes [facility])   [place] [facility] Larae Grooms, NP   1 year ago Hypertension associated with diabetes [facility])   [place] [facility] Larae Grooms, NP       Future Appointments             In 1 month Larae Grooms, NP [place] [facility], [name]

## 2024-01-05 ENCOUNTER — Other Ambulatory Visit: Payer: Self-pay

## 2024-01-05 ENCOUNTER — Telehealth: Payer: Self-pay | Admitting: Family

## 2024-01-05 MED ORDER — SPIRONOLACTONE 25 MG PO TABS: 25.0000 mg | ORAL_TABLET | Freq: Every day | ORAL | 3 refills | Status: AC

## 2024-01-05 MED ORDER — SPIRONOLACTONE 25 MG PO TABS: 25.0000 mg | ORAL_TABLET | Freq: Every day | ORAL | 3 refills | Status: DC

## 2024-01-05 NOTE — Telephone Encounter (Signed)
 done

## 2024-01-19 ENCOUNTER — Encounter: Payer: Self-pay | Admitting: Nurse Practitioner

## 2024-01-19 ENCOUNTER — Ambulatory Visit (INDEPENDENT_AMBULATORY_CARE_PROVIDER_SITE_OTHER): Admitting: Family Medicine

## 2024-01-19 ENCOUNTER — Encounter: Payer: Self-pay | Admitting: Family Medicine

## 2024-01-19 ENCOUNTER — Ambulatory Visit: Payer: Self-pay | Admitting: *Deleted

## 2024-01-19 VITALS — BP 111/73 | HR 66 | Temp 98.2°F | Ht 61.0 in | Wt 182.0 lb

## 2024-01-19 DIAGNOSIS — R6884 Jaw pain: Secondary | ICD-10-CM

## 2024-01-19 DIAGNOSIS — M4722 Other spondylosis with radiculopathy, cervical region: Secondary | ICD-10-CM | POA: Diagnosis not present

## 2024-01-19 MED ORDER — BACLOFEN 10 MG PO TABS
5.0000 mg | ORAL_TABLET | Freq: Every evening | ORAL | 0 refills | Status: AC | PRN
Start: 2024-01-19 — End: ?

## 2024-01-19 MED ORDER — MELOXICAM 7.5 MG PO TABS: 7.5000 mg | ORAL_TABLET | Freq: Every day | ORAL | 0 refills | Status: DC

## 2024-01-19 NOTE — Assessment & Plan Note (Addendum)
 Found on MRI in November. Appears to be in exacerbation with pain radiating into her head, jaw and chest. EKG normal. Warning signs to go to ER discussed. Will treat with baclofen, meloxicam and stretches and follow up with PCP as scheduled in 2 weeks. May need PT.

## 2024-01-19 NOTE — Progress Notes (Signed)
 BP 111/73   Pulse 66   Temp 98.2 F (36.8 C)   Ht 5\' 1"  (1.549 m)   Wt 182 lb (82.6 kg)   SpO2 99%   BMI 34.39 kg/m    Subjective:    Patient ID: Amanda [name], female    DOB: September 08, 1956, 68 y.o.   MRN: [id number]  HPI: Amanda [name] is a 68 y.o. female  Chief Complaint  Patient presents with   Headache    Pt complains of migraine for last 2 days. No relief    HEADACHE Duration:  yesterday Onset: gradual Severity: 6/10 Quality: stabbing Frequency: constant Location: R side of her jaw, and into her chest Headache duration: constant since yesterday Radiation: into her jaw, and chest Alleviating factors: nothing Aggravating factors: moving around Headache status at time of visit: head is not hurting, now her jaw and chest is Treatments attempted: tyenol   Aura: no Nausea:  no Vomiting: no Photophobia:  no Phonophobia:  no Effect on social functioning:  no Confusion:  no Gait disturbance/ataxia:  no Behavioral changes:  no Fevers:  no  Relevant past medical, surgical, family and social history reviewed and updated as indicated. Interim medical history since our last visit reviewed. Allergies and medications reviewed and updated.  Review of Systems  Constitutional: Negative.   HENT: Negative.    Eyes: Negative.   Respiratory:  Positive for chest tightness and shortness of breath. Negative for apnea, cough, choking, wheezing and stridor.   Cardiovascular:  Positive for chest pain. Negative for palpitations and leg swelling.  Gastrointestinal: Negative.   Psychiatric/Behavioral: Negative.      Per HPI unless specifically indicated above     Objective:    BP 111/73   Pulse 66   Temp 98.2 F (36.8 C)   Ht 5\' 1"  (1.549 m)   Wt 182 lb (82.6 kg)   SpO2 99%   BMI 34.39 kg/m   Wt Readings from Last 3 Encounters:  01/19/24 182 lb (82.6 kg)  10/16/23 181 lb (82.1 kg)  09/05/23 178 lb 6.4 oz (80.9 kg)    Physical Exam Vitals and nursing note reviewed.   Constitutional:      General: She is not in acute distress.    Appearance: Normal appearance. She is not ill-appearing, toxic-appearing or diaphoretic.  HENT:     Head: Normocephalic and atraumatic.     Right Ear: Tympanic membrane, ear canal and external ear normal.     Left Ear: Tympanic membrane, ear canal and external ear normal.     Nose: Nose normal. No congestion or rhinorrhea.     Mouth/Throat:     Mouth: Mucous membranes are moist.     Pharynx: Oropharynx is clear. No oropharyngeal exudate or posterior oropharyngeal erythema.  Eyes:     General: No scleral icterus.       Right eye: No discharge.        Left eye: No discharge.     Extraocular Movements: Extraocular movements intact.     Conjunctiva/sclera: Conjunctivae normal.     Pupils: Pupils are equal, round, and reactive to light.  Cardiovascular:     Rate and Rhythm: Normal rate and regular rhythm.     Pulses: Normal pulses.     Heart sounds: Normal heart sounds. No murmur heard.    No friction rub. No gallop.  Pulmonary:     Effort: Pulmonary effort is normal. No respiratory distress.     Breath sounds: Normal breath sounds. No  stridor. No wheezing, rhonchi or rales.  Chest:     Chest wall: Tenderness (to anterior chest on palpation) present.  Musculoskeletal:        General: Normal range of motion.     Cervical back: Normal range of motion and neck supple. Tenderness (to R side of neck at C3 and C4 on the R and in R SCM) present.  Skin:    General: Skin is warm and dry.     Capillary Refill: Capillary refill takes less than 2 seconds.     Coloration: Skin is not jaundiced or pale.     Findings: No bruising, erythema, lesion or rash.  Neurological:     General: No focal deficit present.     Mental Status: She is alert and oriented to person, place, and time. Mental status is at baseline.  Psychiatric:        Mood and Affect: Mood normal.        Behavior: Behavior normal.        Thought Content: Thought  content normal.        Judgment: Judgment normal.     Results for orders placed or performed in visit on 09/05/23  Comp Met (CMET)   Collection Time: 09/05/23  9:07 AM  Result Value Ref Range   Glucose 165 (H) 70 - 99 mg/dL   BUN 20 8 - 27 mg/dL   Creatinine, Ser 1.61 (H) 0.57 - 1.00 mg/dL   eGFR 53 (L) >09 UE/AVW/0.98   BUN/Creatinine Ratio 18 12 - 28   Sodium 140 134 - 144 mmol/L   Potassium 4.6 3.5 - 5.2 mmol/L   Chloride 104 96 - 106 mmol/L   CO2 22 20 - 29 mmol/L   Calcium 9.4 8.7 - 10.3 mg/dL   Total Protein 6.7 6.0 - 8.5 g/dL   Albumin 4.3 3.9 - 4.9 g/dL   Globulin, Total 2.4 1.5 - 4.5 g/dL   Bilirubin Total 0.5 0.0 - 1.2 mg/dL   Alkaline Phosphatase 79 44 - 121 IU/L   AST 23 0 - 40 IU/L   ALT 15 0 - 32 IU/L      Assessment & Plan:   Problem List Items Addressed This Visit       Nervous and Auditory   Osteoarthritis of spine with radiculopathy, cervical region - Primary   Found on MRI in November. Appears to be in exacerbation with pain radiating into her head, jaw and chest. EKG normal. Warning signs to go to ER discussed. Will treat with baclofen, meloxicam and stretches and follow up with PCP as scheduled in 2 weeks. May need PT.       Relevant Medications   baclofen (LIORESAL) 10 MG tablet   Other Visit Diagnoses       Jaw pain       EKG normal. Suspect neck pathology. See discussion above.   Relevant Orders   EKG 12-Lead        Follow up plan: Return for As scheduled with PCP.

## 2024-01-19 NOTE — Telephone Encounter (Signed)
 Copied from CRM [id number]. Topic: Clinical - Red Word Triage >> Jan 19, 2024 11:17 AM Alessandra Bevels wrote: Red Word that prompted transfer to Nurse Triage: Patient is calling to report a headache - with pain from the top of head, with down right side of ear, shoulder, chest. Reason for Disposition  [1] New headache AND [2] age > 13  Answer Assessment - Initial Assessment Questions 1. LOCATION: "Where does it hurt?"      I'm having a headache since yesterday 2. ONSET: "When did the headache start?" (Minutes, hours or days)      Yesterday started.    Tylenol not helping. 3. PATTERN: "Does the pain come and go, or has it been constant since it started?"     Constant 4. SEVERITY: "How bad is the pain?" and "What does it keep you from doing?"  (e.g., Scale 1-10; mild, moderate, or severe)   - MILD (1-3): doesn't interfere with normal activities    - MODERATE (4-7): interferes with normal activities or awakens from sleep    - SEVERE (8-10): excruciating pain, unable to do any normal activities        7/10   It's on my right side through ear, shoulder and top back of my back and shoulder and breast plate of my chest.   When I bend over it hurts. No injuries or accidents 5. RECURRENT SYMPTOM: "Have you ever had headaches before?" If Yes, ask: "When was the last time?" and "What happened that time?"      No 6. CAUSE: "What do you think is causing the headache?"     I don't know 7. MIGRAINE: "Have you been diagnosed with migraine headaches?" If Yes, ask: "Is this headache similar?"      Headaches on and off since I was a teenager but nothing like this.  8. HEAD INJURY: "Has there been any recent injury to the head?"      No 9. OTHER SYMPTOMS: "Do you have any other symptoms?" (fever, stiff neck, eye pain, sore throat, cold symptoms)     No nausea or sensitivity to light.  10. PREGNANCY: "Is there any chance you are pregnant?" "When was your last menstrual period?"       N/A due to age  Protocols  used: Headache-A-AH  Chief Complaint: Headache not helped with Tylenol Symptoms: Above Frequency: Started Sunday Pertinent Negatives: Patient denies Tylenol helping Disposition: [] ED /[] Urgent Care (no appt availability in office) / [x] Appointment(In office/virtual)/ []  [facility]/ [] Home Care/ [] Refused Recommended Disposition /[] [facility]/ []  Follow-up with PCP Additional Notes: Appt made with Dr. Laural Benes for today.

## 2024-01-25 ENCOUNTER — Other Ambulatory Visit: Payer: Self-pay | Admitting: Nurse Practitioner

## 2024-01-27 NOTE — Telephone Encounter (Signed)
 Requested medication (s) are due for refill today: yes  Requested medication (s) are on the active medication list: yes  Last refill:  3/11/253 ml   Future visit scheduled: yes  Notes to clinic:  abnormal Hgb A1C and creatinine   Requested Prescriptions  Pending Prescriptions Disp Refills   OZEMPIC, 2 MG/DOSE, 8 MG/3ML SOPN [Pharmacy Med Name: Ozempic (2 MG/DOSE) 8 MG/3ML Subcutaneous Solution Pen-injector] 3 mL 0    Sig: INJECT 2MG  SUBCUTANEOUSLY ONCE WEEKLY     Endocrinology:  Diabetes - GLP-1 Receptor Agonists - semaglutide Failed - 01/27/2024  9:27 AM      Failed - HBA1C in normal range and within 180 days    Hemoglobin A1C  Date Value Ref Range Status  06/12/2016 7.8  Final   HB A1C (BAYER DCA - WAIVED)  Date Value Ref Range Status  01/22/2022 6.9 (H) 4.8 - 5.6 % Final    Comment:             Prediabetes: 5.7 - 6.4          Diabetes: >6.4          Glycemic control for adults with diabetes: <7.0    Hgb A1c MFr Bld  Date Value Ref Range Status  08/04/2023 7.1 (H) 4.8 - 5.6 % Final    Comment:             Prediabetes: 5.7 - 6.4          Diabetes: >6.4          Glycemic control for adults with diabetes: <7.0          Failed - Cr in normal range and within 360 days    Creatinine, Ser  Date Value Ref Range Status  09/05/2023 1.13 (H) 0.57 - 1.00 mg/dL Final         Failed - Valid encounter within last 6 months    Recent Outpatient Visits           1 week ago Osteoarthritis of spine with radiculopathy, cervical region   [facility] Dorcas Carrow, DO       Future Appointments             In 1 week Larae Grooms, NP [name]

## 2024-02-03 ENCOUNTER — Encounter: Payer: Self-pay | Admitting: Nurse Practitioner

## 2024-02-03 ENCOUNTER — Ambulatory Visit: Payer: Self-pay | Admitting: Nurse Practitioner

## 2024-02-03 VITALS — BP 134/80 | HR 69 | Temp 98.3°F | Ht 61.0 in | Wt 183.8 lb

## 2024-02-03 DIAGNOSIS — N1831 Chronic kidney disease, stage 3a: Secondary | ICD-10-CM | POA: Diagnosis not present

## 2024-02-03 DIAGNOSIS — E1165 Type 2 diabetes mellitus with hyperglycemia: Secondary | ICD-10-CM | POA: Diagnosis not present

## 2024-02-03 DIAGNOSIS — E1159 Type 2 diabetes mellitus with other circulatory complications: Secondary | ICD-10-CM | POA: Diagnosis not present

## 2024-02-03 DIAGNOSIS — E1169 Type 2 diabetes mellitus with other specified complication: Secondary | ICD-10-CM | POA: Diagnosis not present

## 2024-02-03 DIAGNOSIS — E785 Hyperlipidemia, unspecified: Secondary | ICD-10-CM

## 2024-02-03 DIAGNOSIS — I152 Hypertension secondary to endocrine disorders: Secondary | ICD-10-CM

## 2024-02-03 MED ORDER — EMPAGLIFLOZIN 10 MG PO TABS: 10.0000 mg | ORAL_TABLET | Freq: Every day | ORAL | 1 refills | Status: DC

## 2024-02-03 MED ORDER — MELOXICAM 7.5 MG PO TABS: 7.5000 mg | ORAL_TABLET | Freq: Every day | ORAL | 0 refills | Status: DC

## 2024-02-03 MED ORDER — CARVEDILOL 12.5 MG PO TABS: 12.5000 mg | ORAL_TABLET | Freq: Two times a day (BID) | ORAL | 1 refills | Status: DC

## 2024-02-03 MED ORDER — ROSUVASTATIN CALCIUM 10 MG PO TABS: 10.0000 mg | ORAL_TABLET | Freq: Every day | ORAL | 1 refills | Status: DC

## 2024-02-03 NOTE — Assessment & Plan Note (Signed)
 Chronic. Lipid panel drawn in clinic today. Increased crestor to 10mg  daily.  Will increase as patient tolerates the medication.  Follow up in 6 months.  Call sooner if concerns arise.

## 2024-02-03 NOTE — Assessment & Plan Note (Signed)
 Chronic.  Controlled.  Blood pressures at home running 120-130/60. Continue with Carvedilol and Entresto.  No changes made to current regimen. Labs ordered today. Will make recommendations based on lab results. Follow up in 6 months.  Call sooner if concerns arise.

## 2024-02-03 NOTE — Assessment & Plan Note (Signed)
 Chronic. Labs ordered at visit today.  Will make recommendations based on lab results. Continue with Jardiance.  Creatinine improved with stopping daily lasix- she remains off of lasix without swelling.

## 2024-02-03 NOTE — Assessment & Plan Note (Signed)
 Chronic. Well controlled. Pt tolerating ozempic. Denies any adverse side effects at this time.  A1c well controlled at 7.1%.   Eye exam and microalbumin up to date.  Continue with current medication regimen of Ozempic and Jardiance.  Follow up in 6 months.  Call sooner if concerns arise.

## 2024-02-03 NOTE — Progress Notes (Signed)
 BP 134/80 (BP Location: Right Arm, Cuff Size: Normal)   Pulse 69   Temp 98.3 F (36.8 C) (Oral)   Ht 5\' 1"  (1.549 m)   Wt 183 lb 12.8 oz (83.4 kg)   SpO2 98%   BMI 34.73 kg/m    Subjective:    Patient ID: Amanda [name], female    DOB: [birth year], 68 y.o.   MRN: [id number]  HPI: Amanda [name] is a 68 y.o. female  Chief Complaint  Patient presents with   Diabetes    Eye exam requested from [facility]   Hyperlipidemia   Hypertension   HYPERTENSION / HYPERLIPIDEMIA/HF Patient is weighing several times a week.   She was told to stop the Lasix and Potassium and only take as needed for swelling due to decline in kidney function.  Has not needed to take any. She really hasn't needed any lasix recently.  Not having any swelling in her lower extremities.  Satisfied with current treatment? yes Duration of hypertension: chronic BP monitoring frequency: a few times a week BP range: 120/60 BP medication side effects: no Past BP meds: carvedilol, entresto Duration of hyperlipidemia: chronic Cholesterol medication side effects: no Cholesterol supplements: none Past cholesterol medications: Crestor Medication compliance: Compliant Aspirin: no Recent stressors: no Recurrent headaches: no Visual changes: no Palpitations: no Dyspnea: no Chest pain: no Lower extremity edema: no Dizzy/lightheaded: no   DIABETES Doing well with Ozempic 2mg  and Jardiance.  Denies side effects. Hypoglycemic episodes:no Polydipsia/polyuria: no Visual disturbance: no Chest pain: no Paresthesias: no Glucose Monitoring: no  Accucheck frequency: not checking  Fasting glucose:   Post prandial:  Evening:  Before meals: Taking Insulin?: no  Long acting insulin:  Short acting insulin: Blood Pressure Monitoring: a few times a week Retinal Examination: Up to date;  Foot Exam: Up to Date;  Diabetic Education: Completed Pneumovax: up to date Influenza: Not up to Date Aspirin: no  CHRONIC KIDNEY  DISEASE CKD status: stable Medications renally dose: yes Previous renal evaluation: yes; Pneumovax:  Not up to Date Influenza Vaccine:  Not up to Date  ANEMIA Anemia status: stable Etiology of anemia: Duration of anemia treatment:  Compliance with treatment: excellent compliance Iron supplementation side effects: no Severity of anemia: mild Fatigue: no Decreased exercise tolerance: no  Dyspnea on exertion: no Palpitations: no Bleeding: no Pica: no  Relevant past medical, surgical, family and social history reviewed and updated as indicated. Interim medical history since our last visit reviewed. Allergies and medications reviewed and updated.  Review of Systems  Eyes:  Negative for visual disturbance.  Respiratory:  Negative for cough, chest tightness and shortness of breath.   Cardiovascular:  Negative for chest pain, palpitations and leg swelling.  Endocrine: Negative for polydipsia and polyuria.  Neurological:  Negative for dizziness, numbness and headaches.    Per HPI unless specifically indicated above     Objective:    BP 134/80 (BP Location: Right Arm, Cuff Size: Normal)   Pulse 69   Temp 98.3 F (36.8 C) (Oral)   Ht 5\' 1"  (1.549 m)   Wt 183 lb 12.8 oz (83.4 kg)   SpO2 98%   BMI 34.73 kg/m   Wt Readings from Last 3 Encounters:  02/03/24 183 lb 12.8 oz (83.4 kg)  01/19/24 182 lb (82.6 kg)  10/16/23 181 lb (82.1 kg)    Physical Exam Vitals and nursing note reviewed.  Constitutional:      General: She is not in acute distress.  Appearance: Normal appearance. She is normal weight. She is not ill-appearing, toxic-appearing or diaphoretic.  HENT:     Head: Normocephalic.     Right Ear: External ear normal.     Left Ear: External ear normal.     Nose: Nose normal.     Mouth/Throat:     Mouth: Mucous membranes are moist.     Pharynx: Oropharynx is clear.  Eyes:     General:        Right eye: No discharge.        Left eye: No discharge.      Extraocular Movements: Extraocular movements intact.     Conjunctiva/sclera: Conjunctivae normal.     Pupils: Pupils are equal, round, and reactive to light.  Cardiovascular:     Rate and Rhythm: Normal rate and regular rhythm.     Heart sounds: No murmur heard. Pulmonary:     Effort: Pulmonary effort is normal. No respiratory distress.     Breath sounds: Normal breath sounds. No wheezing or rales.  Musculoskeletal:     Cervical back: Normal range of motion and neck supple.     Right lower leg: No edema.     Left lower leg: No edema.  Skin:    General: Skin is warm and dry.     Capillary Refill: Capillary refill takes less than 2 seconds.  Neurological:     General: No focal deficit present.     Mental Status: She is alert and oriented to person, place, and time. Mental status is at baseline.  Psychiatric:        Mood and Affect: Mood normal.        Behavior: Behavior normal.        Thought Content: Thought content normal.        Judgment: Judgment normal.     Results for orders placed or performed in visit on 09/05/23  Comp Met (CMET)   Collection Time: 09/05/23  9:07 AM  Result Value Ref Range   Glucose 165 (H) 70 - 99 mg/dL   BUN 20 8 - 27 mg/dL   Creatinine, Ser 4.09 (H) 0.57 - 1.00 mg/dL   eGFR 53 (L) >81 XB/JYN/8.29   BUN/Creatinine Ratio 18 12 - 28   Sodium 140 134 - 144 mmol/L   Potassium 4.6 3.5 - 5.2 mmol/L   Chloride 104 96 - 106 mmol/L   CO2 22 20 - 29 mmol/L   Calcium 9.4 8.7 - 10.3 mg/dL   Total Protein 6.7 6.0 - 8.5 g/dL   Albumin 4.3 3.9 - 4.9 g/dL   Globulin, Total 2.4 1.5 - 4.5 g/dL   Bilirubin Total 0.5 0.0 - 1.2 mg/dL   Alkaline Phosphatase 79 44 - 121 IU/L   AST 23 0 - 40 IU/L   ALT 15 0 - 32 IU/L      Assessment & Plan:   Problem List Items Addressed This Visit       Cardiovascular and Mediastinum   Hypertension associated with diabetes (HCC)   Chronic.  Controlled.  Blood pressures at home running 120-130/60. Continue with Carvedilol and  Entresto.  No changes made to current regimen. Labs ordered today. Will make recommendations based on lab results. Follow up in 6 months.  Call sooner if concerns arise.       Relevant Medications   carvedilol (COREG) 12.5 MG tablet   empagliflozin (JARDIANCE) 10 MG TABS tablet   rosuvastatin (CRESTOR) 10 MG tablet     Endocrine  Hyperlipidemia associated with type 2 diabetes mellitus (HCC)   Chronic. Lipid panel drawn in clinic today. Increased crestor to 10mg  daily.  Will increase as patient tolerates the medication.  Follow up in 6 months.  Call sooner if concerns arise.       Relevant Medications   carvedilol (COREG) 12.5 MG tablet   empagliflozin (JARDIANCE) 10 MG TABS tablet   rosuvastatin (CRESTOR) 10 MG tablet   Other Relevant Orders   Lipid panel   Uncontrolled type 2 diabetes mellitus with hyperglycemia, without long-term current use of insulin (HCC)   Chronic. Well controlled. Pt tolerating ozempic. Denies any adverse side effects at this time.  A1c well controlled at 7.1%.   Eye exam and microalbumin up to date.  Continue with current medication regimen of Ozempic and Jardiance.  Follow up in 6 months.  Call sooner if concerns arise.       Relevant Medications   empagliflozin (JARDIANCE) 10 MG TABS tablet   rosuvastatin (CRESTOR) 10 MG tablet   Other Relevant Orders   Hemoglobin A1c     Genitourinary   CKD (chronic kidney disease) stage 3, GFR 30-59 ml/min (HCC) - Primary   Chronic. Labs ordered at visit today.  Will make recommendations based on lab results. Continue with Jardiance.  Creatinine improved with stopping daily lasix- she remains off of lasix without swelling.      Relevant Orders   Comprehensive metabolic panel with GFR   CBC With Diff/Platelet     Follow up plan: Return in about 6 months (around 08/04/2024) for Physical and Fasting labs.

## 2024-02-04 ENCOUNTER — Encounter: Payer: Self-pay | Admitting: Nurse Practitioner

## 2024-02-04 LAB — COMPREHENSIVE METABOLIC PANEL WITH GFR
ALT: 15 IU/L (ref 0–32)
AST: 22 IU/L (ref 0–40)
Albumin: 4.3 g/dL (ref 3.9–4.9)
Alkaline Phosphatase: 82 IU/L (ref 44–121)
BUN/Creatinine Ratio: 18 (ref 12–28)
BUN: 23 mg/dL (ref 8–27)
Bilirubin Total: 0.4 mg/dL (ref 0.0–1.2)
CO2: 23 mmol/L (ref 20–29)
Calcium: 9.4 mg/dL (ref 8.7–10.3)
Chloride: 104 mmol/L (ref 96–106)
Creatinine, Ser: 1.25 mg/dL — ABNORMAL HIGH (ref 0.57–1.00)
Globulin, Total: 2.1 g/dL (ref 1.5–4.5)
Glucose: 130 mg/dL — ABNORMAL HIGH (ref 70–99)
Potassium: 4.9 mmol/L (ref 3.5–5.2)
Sodium: 141 mmol/L (ref 134–144)
Total Protein: 6.4 g/dL (ref 6.0–8.5)
eGFR: 47 mL/min/{1.73_m2} — ABNORMAL LOW (ref >59)

## 2024-02-04 LAB — CBC WITH DIFF/PLATELET
Basophils Absolute: 0 10*3/uL (ref 0.0–0.2)
Basos: 0 %
EOS (ABSOLUTE): 0.2 10*3/uL (ref 0.0–0.4)
Eos: 3 %
Hematocrit: 38.4 % (ref 34.0–46.6)
Hemoglobin: 12.8 g/dL (ref 11.1–15.9)
Immature Grans (Abs): 0 10*3/uL (ref 0.0–0.1)
Immature Granulocytes: 0 %
Lymphocytes Absolute: 1.9 10*3/uL (ref 0.7–3.1)
Lymphs: 29 %
MCH: 31.8 pg (ref 26.6–33.0)
MCHC: 33.3 g/dL (ref 31.5–35.7)
MCV: 96 fL (ref 79–97)
Monocytes Absolute: 0.4 10*3/uL (ref 0.1–0.9)
Monocytes: 6 %
Neutrophils Absolute: 4.2 10*3/uL (ref 1.4–7.0)
Neutrophils: 62 %
Platelets: 184 10*3/uL (ref 150–450)
RBC: 4.02 x10E6/uL (ref 3.77–5.28)
RDW: 12.8 % (ref 11.7–15.4)
WBC: 6.8 10*3/uL (ref 3.4–10.8)

## 2024-02-04 LAB — LIPID PANEL
Chol/HDL Ratio: 2.8 ratio (ref 0.0–4.4)
Cholesterol, Total: 148 mg/dL (ref 100–199)
HDL: 52 mg/dL (ref >39)
LDL Chol Calc (NIH): 72 mg/dL (ref 0–99)
Triglycerides: 136 mg/dL (ref 0–149)
VLDL Cholesterol Cal: 24 mg/dL (ref 5–40)

## 2024-02-04 LAB — HEMOGLOBIN A1C
Est. average glucose Bld gHb Est-mCnc: 154 mg/dL
Hgb A1c MFr Bld: 7 % — ABNORMAL HIGH (ref 4.8–5.6)

## 2024-03-06 ENCOUNTER — Other Ambulatory Visit: Payer: Self-pay | Admitting: Nurse Practitioner

## 2024-03-09 ENCOUNTER — Telehealth: Payer: Self-pay | Admitting: Nurse Practitioner

## 2024-03-09 NOTE — Telephone Encounter (Signed)
 Requested Prescriptions  Pending Prescriptions Disp Refills   meloxicam  (MOBIC ) 7.5 MG tablet [Pharmacy Med Name: Meloxicam  7.5 MG Oral Tablet] 30 tablet 4    Sig: Take 1 tablet by mouth once daily     Analgesics:  COX2 Inhibitors Failed - 03/09/2024 12:33 PM      Failed - Manual Review: Labs are only required if the patient has taken medication for more than 8 weeks.      Failed - Cr in normal range and within 360 days    Creatinine, Ser  Date Value Ref Range Status  02/03/2024 1.25 (H) 0.57 - 1.00 mg/dL Final         Passed - HGB in normal range and within 360 days    Hemoglobin  Date Value Ref Range Status  02/03/2024 12.8 11.1 - 15.9 g/dL Final         Passed - HCT in normal range and within 360 days    Hematocrit  Date Value Ref Range Status  02/03/2024 38.4 34.0 - 46.6 % Final         Passed - AST in normal range and within 360 days    AST  Date Value Ref Range Status  02/03/2024 22 0 - 40 IU/L Final   AST (SGOT) Piccolo, Waived  Date Value Ref Range Status  07/16/2017 28 11 - 38 U/L Final         Passed - ALT in normal range and within 360 days    ALT  Date Value Ref Range Status  02/03/2024 15 0 - 32 IU/L Final   ALT (SGPT) Piccolo, Waived  Date Value Ref Range Status  07/16/2017 33 10 - 47 U/L Final         Passed - eGFR is 30 or above and within 360 days    GFR calc Af Amer  Date Value Ref Range Status  09/05/2020 59 (L) >59 mL/min/1.73 Final    Comment:    **In accordance with recommendations from the NKF-ASN Task force,**   [facility] is in the process of updating its eGFR calculation to the   2021 CKD-EPI creatinine equation that estimates kidney function   without a race variable.    GFR, Estimated  Date Value Ref Range Status  08/18/2023 56 (L) >60 mL/min Final    Comment:    (NOTE) Calculated using the CKD-EPI Creatinine Equation (2021)    eGFR  Date Value Ref Range Status  02/03/2024 47 (L) >59 mL/min/1.73 Final         Passed -  Patient is not pregnant      Passed - Valid encounter within last 12 months    Recent Outpatient Visits           1 month ago Stage 3a chronic kidney disease (HCC)   [place] [facility]en Alexanders, NP   1 month ago Osteoarthritis of spine with radiculopathy, cervical region   [facility] [place], DO

## 2024-03-09 NOTE — Telephone Encounter (Signed)
 Copied from CRM [id number]. Topic: Medicare AWV >> Mar 09, 2024 11:56 AM Juliana Ocean wrote: Reason for CRM: LVM for patient to call concerning her 2 awv appts.  Please call me.  Rosalee Collins; Care Guide Ambulatory Clinical Support [place] [facility] Direct Dial: [phone]

## 2024-04-14 ENCOUNTER — Telehealth: Payer: Self-pay | Admitting: Family

## 2024-04-14 NOTE — Telephone Encounter (Signed)
 Called to confirm/remind patient of their appointment at the [facility] on 04/15/24.   Appointment:   [x] Confirmed  [] Left mess   [] No answer/No voice mail  [] VM Full/unable to leave message  [] Phone not in service  Patient reminded to bring all medications and/or complete list.  Confirmed patient has transportation. Gave directions, instructed to utilize valet parking.

## 2024-04-15 ENCOUNTER — Encounter: Payer: Self-pay | Admitting: Family

## 2024-04-15 ENCOUNTER — Ambulatory Visit: Payer: Medicare HMO | Attending: Family | Admitting: Family

## 2024-04-15 VITALS — BP 132/46 | HR 69 | Wt 184.0 lb

## 2024-04-15 DIAGNOSIS — E785 Hyperlipidemia, unspecified: Secondary | ICD-10-CM | POA: Diagnosis not present

## 2024-04-15 DIAGNOSIS — Z7984 Long term (current) use of oral hypoglycemic drugs: Secondary | ICD-10-CM | POA: Insufficient documentation

## 2024-04-15 DIAGNOSIS — Z87891 Personal history of nicotine dependence: Secondary | ICD-10-CM | POA: Insufficient documentation

## 2024-04-15 DIAGNOSIS — Z7985 Long-term (current) use of injectable non-insulin antidiabetic drugs: Secondary | ICD-10-CM | POA: Diagnosis not present

## 2024-04-15 DIAGNOSIS — I428 Other cardiomyopathies: Secondary | ICD-10-CM | POA: Insufficient documentation

## 2024-04-15 DIAGNOSIS — Z79899 Other long term (current) drug therapy: Secondary | ICD-10-CM | POA: Diagnosis not present

## 2024-04-15 DIAGNOSIS — I480 Paroxysmal atrial fibrillation: Secondary | ICD-10-CM

## 2024-04-15 DIAGNOSIS — Z833 Family history of diabetes mellitus: Secondary | ICD-10-CM | POA: Insufficient documentation

## 2024-04-15 DIAGNOSIS — I5032 Chronic diastolic (congestive) heart failure: Secondary | ICD-10-CM | POA: Diagnosis not present

## 2024-04-15 DIAGNOSIS — E782 Mixed hyperlipidemia: Secondary | ICD-10-CM | POA: Diagnosis not present

## 2024-04-15 DIAGNOSIS — I10 Essential (primary) hypertension: Secondary | ICD-10-CM

## 2024-04-15 DIAGNOSIS — Z5986 Financial insecurity: Secondary | ICD-10-CM | POA: Insufficient documentation

## 2024-04-15 DIAGNOSIS — E119 Type 2 diabetes mellitus without complications: Secondary | ICD-10-CM | POA: Insufficient documentation

## 2024-04-15 DIAGNOSIS — I4891 Unspecified atrial fibrillation: Secondary | ICD-10-CM | POA: Insufficient documentation

## 2024-04-15 DIAGNOSIS — I110 Hypertensive heart disease with heart failure: Secondary | ICD-10-CM | POA: Diagnosis not present

## 2024-04-15 NOTE — Patient Instructions (Signed)
  Lab Work:  Go DOWN to LOWER LEVEL (LL) to have your blood work completed inside of [employer] office.  We will only call you if the results are abnormal or if the provider would like to make medication changes.   Testing/Procedures:  Please have your echo completed. You will check in for this at the [facility]. You have to arrive 15 MINS EARLY for preparation, otherwise you will have to reschedule.   Follow-Up in: 7 months ( January ) ** PLEASE CALL THE OFFICE IN NOVEMBER TO ARRANGE YOUR FOLLOW UP APPOINTMENT.**  At the [facility], you and your health needs are our priority. We have a designated team specialized in the treatment of Heart Failure. This Care Team includes your primary Heart Failure Specialized Cardiologist (physician), Advanced Practice Providers (APPs- Physician Assistants and Nurse Practitioners), and Pharmacist who all work together to provide you with the care you need, when you need it.   You may see any of the following providers on your designated Care Team at your next follow up:  Dr. Toribio Fuel Dr. Ezra Shuck Dr. Ria Commander Dr. Odis Brownie Ellouise Class, FNP Jaun Bash, RPH-CPP  Please be sure to bring in all your medications bottles to every appointment.   Need to Contact Us :  If you have any questions or concerns before your next appointment please send us  a message through [place] or call our office at [phone].    TO LEAVE A MESSAGE FOR THE NURSE SELECT OPTION 2, PLEASE LEAVE A MESSAGE INCLUDING: YOUR NAME DATE OF BIRTH CALL BACK NUMBER REASON FOR CALL**this is important as we prioritize the call backs  YOU WILL RECEIVE A CALL BACK THE SAME DAY AS LONG AS YOU CALL BEFORE 4:00 PM

## 2024-04-15 NOTE — Progress Notes (Signed)
 Advanced Heart Failure Clinic Note    PCP: Melvin Pao, NP (last seen 04/25) Cardiologist: Ammon Blunt, MD / Lucyann Palma, PA (last seen 10/24, returns 10/25) HF provider: Zenaida Flores, MD (last seen 12/24)  Chief Complaint: HF visit   HPI:  [name] is a 68 y/o female with a history of DM, hyperlipidemia, HTN, anxiety, atrial fibrillation, GERD, previous tobacco use and chronic heart failure.   Echo 01/05/22: EF of 50-55% along with moderate LAE & moderate MR.   Has not been admitted or been in the ED in the last 6 months.   Seen in [facility] 12/24 where spironolactone  was increased to 25mg  daily.   She presents today with a chief complaint of a HF visit. Denies shortness of breath, fatigue, chest pain, palpitations, abdominal distention, pedal edema, dizziness or difficulty sleeping. Continues to work at a daycare and recently went on a field trip with the kids to an [place] and had no issues with all the walking.    ROS: All systems negative except what is listed in HPI, PMH and Problem List   Past Medical History:  Diagnosis Date   Anxiety    Arthritis    Atrial fibrillation (HCC)    CHF (congestive heart failure) (HCC)    Cholecystitis    Cholecystitis    Diabetes mellitus without complication (HCC)    Dyspnea    GERD (gastroesophageal reflux disease)    Hyperlipidemia    Hypertension     Current Outpatient Medications  Medication Sig Dispense Refill   albuterol  (VENTOLIN  HFA) 108 (90 Base) MCG/ACT inhaler Inhale 2 puffs into the lungs every 6 (six) hours as needed for wheezing or shortness of breath. 8 g 0   baclofen  (LIORESAL ) 10 MG tablet Take 0.5-1 tablets (5-10 mg total) by mouth at bedtime as needed. 30 each 0   Calcium  Carb-Cholecalciferol (CALCIUM  600+D3) 600-200 MG-UNIT TABS Take 1 tablet by mouth in the morning.     carvedilol  (COREG ) 12.5 MG tablet Take 1 tablet (12.5 mg total) by mouth 2 (two) times daily with a meal. 180 tablet 1    empagliflozin  (JARDIANCE ) 10 MG TABS tablet Take 1 tablet (10 mg total) by mouth daily before breakfast. 90 tablet 1   ferrous sulfate  325 (65 FE) MG tablet Take 1 tablet (325 mg total) by mouth daily. 30 tablet 3   meloxicam  (MOBIC ) 7.5 MG tablet Take 1 tablet by mouth once daily 30 tablet 4   Multiple Vitamin (MULTIVITAMIN) tablet Take 1 tablet by mouth daily.     omeprazole (PRILOSEC) 20 MG capsule Take 20 mg by mouth daily as needed.     rosuvastatin  (CRESTOR ) 10 MG tablet Take 1 tablet (10 mg total) by mouth daily. 90 tablet 1   sacubitril -valsartan  (ENTRESTO ) 97-103 MG Take 1 tablet by mouth twice daily 180 tablet 1   Semaglutide , 2 MG/DOSE, (OZEMPIC , 2 MG/DOSE,) 8 MG/3ML SOPN INJECT 2MG  SUBCUTANEOUSLY ONCE WEEKLY 6 mL 1   spironolactone  (ALDACTONE ) 25 MG tablet Take 1 tablet (25 mg total) by mouth daily. 90 tablet 3   vitamin B-12 (CYANOCOBALAMIN ) 500 MCG tablet Take 500 mcg by mouth daily.     No current facility-administered medications for this visit.    Allergies  Allergen Reactions   Tramadol Other (See Comments)    woozy   Penicillins Other (See Comments)    Dizziness Tolerated augmentin but does not like taste      Social History   Socioeconomic History   Marital status: Single  Spouse name: Ubaldo   Number of children: Not on file   Years of education: Not on file   Highest education level: Not on file  Occupational History   Not on file  Tobacco Use   Smoking status: Former    Current packs/day: 0.00    Types: Cigarettes    Quit date: 12/07/1975    Years since quitting: 48.3   Smokeless tobacco: Never  Vaping Use   Vaping status: Never Used  Substance and Sexual Activity   Alcohol use: No   Drug use: No   Sexual activity: Not on file  Other Topics Concern   Not on file  Social History Narrative   Not on file   Social Drivers of Health   Financial Resource Strain: High Risk (12/23/2022)   Overall Financial Resource Strain (CARDIA)    Difficulty  of Paying Living Expenses: Hard  Food Insecurity: No Food Insecurity (12/10/2022)   Hunger Vital Sign    Worried About Running Out of Food in the Last Year: Never true    Ran Out of Food in the Last Year: Never true  Transportation Needs: No Transportation Needs (12/10/2022)   PRAPARE - Administrator, Civil Service (Medical): No    Lack of Transportation (Non-Medical): No  Physical Activity: Insufficiently Active (12/10/2022)   Exercise Vital Sign    Days of Exercise per Week: 3 days    Minutes of Exercise per Session: 30 min  Stress: No Stress Concern Present (12/10/2022)   [employer] of Occupational Health - Occupational Stress Questionnaire    Feeling of Stress : Not at all  Social Connections: Socially Isolated (12/10/2022)   Social Connection and Isolation Panel    Frequency of Communication with Friends and Family: More than three times a week    Frequency of Social Gatherings with Friends and Family: Twice a week    Attends Religious Services: Never    Database administrator or Organizations: No    Attends Banker Meetings: Never    Marital Status: Never married  Intimate Partner Violence: Not At Risk (12/10/2022)   Humiliation, Afraid, Rape, and Kick questionnaire    Fear of Current or Ex-Partner: No    Emotionally Abused: No    Physically Abused: No    Sexually Abused: No      Family History  Problem Relation Age of Onset   COPD Mother    Hypertension Mother    Diabetes Mother    Severe combined immunodeficiency Father    Diabetes Sister    Heart disease Sister    Heart disease Sister    Cancer Niece    Vitals:   04/15/24 0939  BP: (!) 132/46  Pulse: 69  SpO2: 100%  Weight: 184 lb (83.5 kg)   Wt Readings from Last 3 Encounters:  04/15/24 184 lb (83.5 kg)  02/03/24 183 lb 12.8 oz (83.4 kg)  01/19/24 182 lb (82.6 kg)   Lab Results  Component Value Date   CREATININE 1.25 (H) 02/03/2024   CREATININE 1.13 (H) 09/05/2023    CREATININE 1.09 (H) 08/18/2023    PHYSICAL EXAM:  General: Well appearing. No resp difficulty HEENT: normal Neck: supple, no JVD Cor: Regular rhythm, rate. No rubs, gallops or murmurs Lungs: clear Abdomen: soft, nontender, nondistended. Extremities: no cyanosis, clubbing, rash, edema Neuro: alert & oriented X 3. Moves all 4 extremities w/o difficulty. Affect pleasant   ECG: not done   ASSESSMENT & PLAN:  1: NICM with  preserved ejection fraction- - likely HTN/ AF - NYHA class I - euvolemic - weighing daily; reminded to call for an overnight weight gain of > 2 pounds or a weekly weight gain of > 5 pounds - weight up 3 pounds from last visit here 6 months ago - Echo 01/05/22: EF of 50-55% along with moderate LAE & moderate MR; plan to update this at her next visit - Echo has been scheduled for 06/18/24 - adding very little salt to food - continue jardiance  10mg  daily - continue carvedilol  12.5mg  BID - continue entresto  97/103mg  BID - continue spironolactone  25mg  daily - continue furosemide  20mg  as needed/  potassium 20meq as needed if takes lasix ; hasn't had to take this since last here 6 months ago - BNP 01/04/22 was 537.6  2: HTN- - BP 132/46; much improved since spiro increased at last visit - saw PCP [facility]) 04/25 - BMP 02/03/24 reviewed: sodium 141, potassium 4.9, creatinine 1.25 and GFR 47 - BMP today  3: DM- - A1c 02/03/24 was 7.0% - PCP recently increased ozempic  to 2mg  weekly due to increased A1c; patient doesn't report any issues with increased dose  4: Atrial fibrillation- - saw cardiology(Steiner) 10/24 - continue carvedilol  12.5mg  BID  5: Hyperlipidemia- - continue rosuvastatin  10mg  daily - LDL 02/03/24 was 72   Return in 01/26, sooner if needed.   Ellouise DELENA Class, FNP 04/15/24

## 2024-04-16 ENCOUNTER — Ambulatory Visit: Payer: Self-pay | Admitting: Family

## 2024-04-16 LAB — BASIC METABOLIC PANEL WITH GFR
BUN/Creatinine Ratio: 19 (ref 12–28)
BUN: 24 mg/dL (ref 8–27)
CO2: 20 mmol/L (ref 20–29)
Calcium: 9.2 mg/dL (ref 8.7–10.3)
Chloride: 104 mmol/L (ref 96–106)
Creatinine, Ser: 1.25 mg/dL — ABNORMAL HIGH (ref 0.57–1.00)
Glucose: 210 mg/dL — ABNORMAL HIGH (ref 70–99)
Potassium: 4.7 mmol/L (ref 3.5–5.2)
Sodium: 141 mmol/L (ref 134–144)
eGFR: 47 mL/min/{1.73_m2} — ABNORMAL LOW (ref >59)

## 2024-05-15 ENCOUNTER — Other Ambulatory Visit: Payer: Self-pay | Admitting: Nurse Practitioner

## 2024-05-17 NOTE — Telephone Encounter (Signed)
 Requested Prescriptions  Pending Prescriptions Disp Refills   OZEMPIC , 2 MG/DOSE, 8 MG/3ML SOPN [Pharmacy Med Name: Ozempic  (2 MG/DOSE) 8 MG/3ML Subcutaneous Solution Pen-injector] 6 mL 0    Sig: INJECT 2MG  SUBCUTANEOUSLY ONCE WEEKLY     Endocrinology:  Diabetes - GLP-1 Receptor Agonists - semaglutide  Failed - 05/17/2024  2:50 PM      Failed - HBA1C in normal range and within 180 days    Hemoglobin A1C  Date Value Ref Range Status  06/12/2016 7.8  Final   HB A1C (BAYER DCA - WAIVED)  Date Value Ref Range Status  01/22/2022 6.9 (H) 4.8 - 5.6 % Final    Comment:             Prediabetes: 5.7 - 6.4          Diabetes: >6.4          Glycemic control for adults with diabetes: <7.0    Hgb A1c MFr Bld  Date Value Ref Range Status  02/03/2024 7.0 (H) 4.8 - 5.6 % Final    Comment:             Prediabetes: 5.7 - 6.4          Diabetes: >6.4          Glycemic control for adults with diabetes: <7.0          Failed - Cr in normal range and within 360 days    Creatinine, Ser  Date Value Ref Range Status  04/15/2024 1.25 (H) 0.57 - 1.00 mg/dL Final         Passed - Valid encounter within last 6 months    Recent Outpatient Visits           3 months ago Stage 3a chronic kidney disease (HCC)   [facility] Melvin Pao, NP   3 months ago Osteoarthritis of spine with radiculopathy, cervical region   [facility], [place], DO

## 2024-05-20 ENCOUNTER — Ambulatory Visit

## 2024-06-03 ENCOUNTER — Ambulatory Visit (INDEPENDENT_AMBULATORY_CARE_PROVIDER_SITE_OTHER)

## 2024-06-03 VITALS — BP 111/54 | Ht 61.0 in | Wt 184.0 lb

## 2024-06-03 DIAGNOSIS — E1165 Type 2 diabetes mellitus with hyperglycemia: Secondary | ICD-10-CM

## 2024-06-03 DIAGNOSIS — Z5320 Procedure and treatment not carried out because of patient's decision for unspecified reasons: Secondary | ICD-10-CM

## 2024-06-03 DIAGNOSIS — Z1211 Encounter for screening for malignant neoplasm of colon: Secondary | ICD-10-CM

## 2024-06-03 DIAGNOSIS — Z0000 Encounter for general adult medical examination without abnormal findings: Secondary | ICD-10-CM

## 2024-06-03 DIAGNOSIS — Z780 Asymptomatic menopausal state: Secondary | ICD-10-CM

## 2024-06-03 NOTE — Patient Instructions (Signed)
 Amanda [name] , Thank you for taking time out of your busy schedule to complete your Annual Wellness Visit with me. I enjoyed our conversation and look forward to speaking with you again next year. I, as well as your care team,  appreciate your ongoing commitment to your health goals. Please review the following plan we discussed and let me know if I can assist you in the future. Your Game plan/ To Do List    Referrals: If you haven't heard from the office you've been referred to, please reach out to them at the phone provided.  A Cologuard was ordered for you today. It's a colon cancer screening test that will arrive at your home soon. Cologuard is an easy-to-use, noninvasive test. You collect a sample in your own home and send it back for testing.  Amanda [name] is $0 cost to you because Cologuard is a benefit to you.  -Your Cologuard kit will arrive in a white box. -Follow the instructions inside.  No prep is required.  -Return the kit via UPS prepaid shipping.  You'll use the same box it arrived in.  -Your result should be available within two weeks.  -Want to view a how-to-use video and get help returning your Cologuard Kit? Go to [email or web address] or scan the QR code on this page.   Lab Orders         Cologuard         Microalbumin / creatinine urine ratio    Have these labs completed at [facility] office  Call to schedule your bone density appt.   [name]  [address] [place] [address] [phone]  Follow up Visits: We will see or speak with you next year for your Next Medicare AWV with our clinical staff  Clinician Recommendations:  Aim for 30 minutes of exercise or brisk walking, 6-8 glasses of water, and 5 servings of fruits and vegetables each day.    Wishing you many blessings and good health during the next year until our next visit.  -[name]   This is a list of the screenings recommended for you:  Health  Maintenance  Topic Date Due   Zoster (Shingles) Vaccine (1 of 2) Never done   Colon Cancer Screening  03/03/2021   COVID-19 Vaccine (4 - 2024-25 season) 06/22/2023   Eye exam for diabetics  10/02/2023   Yearly kidney health urinalysis for diabetes  10/03/2023   Complete foot exam   01/01/2024   Flu Shot  05/21/2024   DTaP/Tdap/Td vaccine (2 - Td or Tdap) 08/03/2024*   Mammogram  02/02/2025*   Hemoglobin A1C  08/04/2024   Yearly kidney function blood test for diabetes  04/15/2025   Medicare Annual Wellness Visit  06/03/2025   DEXA scan (bone density measurement)  01/23/2031   Pneumococcal Vaccine for age over 47  Completed   Hepatitis C Screening  Completed   HPV Vaccine  Aged Out   Meningitis B Vaccine  Aged Out   Pneumococcal Vaccine  Discontinued  *Topic was postponed. The date shown is not the original due date.    Advanced directives: (Declined) Advance directive discussed with you today. Even though you declined this today, please call our office should you change your mind, and we can give you the proper paperwork for you to fill out. Advance Care Planning is important because it:  [x]  Makes sure you receive the medical care that is consistent with your values, goals, and preferences  [  x] It provides guidance to your family and loved ones and reduces their decisional burden about whether or not they are making the right decisions based on your wishes.  Follow the link provided in your after visit summary or read over the paperwork we have mailed to you to help you started getting your Advance Directives in place. If you need assistance in completing these, please reach out to us  so that we can help you!  See attachments for Preventive Care and Fall Prevention Tips.

## 2024-06-03 NOTE — Progress Notes (Signed)
 Please attest and cosign this visit due to patients primary care provider not being in the office at the time the visit was completed.   Subjective:   Amanda [name] is a 68 y.o. who presents for a Medicare Wellness preventive visit.  As a reminder, Annual Wellness Visits don't include a physical exam, and some assessments may be limited, especially if this visit is performed virtually. We may recommend an in-person follow-up visit with your provider if needed.  Visit Complete: Virtual I connected with  Amanda [name] on 06/03/24 by a audio enabled telemedicine application and verified that I am speaking with the correct person using two identifiers.  Patient Location: Other:  work  Arts administrator  I discussed the limitations of evaluation and management by telemedicine. The patient expressed understanding and agreed to proceed.  Vital Signs: Because this visit was a virtual/telehealth visit, some criteria may be missing or patient reported. Any vitals not documented were not able to be obtained and vitals that have been documented are patient reported.  VideoDeclined- This patient declined Librarian, academic. Therefore the visit was completed with audio only.  Persons Participating in Visit: Patient.  AWV Questionnaire: Yes: Patient Medicare AWV questionnaire was completed by the patient on 05/30/2024; I have confirmed that all information answered by patient is correct and no changes since this date.  Cardiac Risk Factors include: advanced age (>22men, >41 women);dyslipidemia;diabetes mellitus;hypertension;obesity (BMI >30kg/m2);sedentary lifestyle;Other (see comment), Risk factor comments: AFib, CHF, DM, HLD     Objective:    Today's Vitals   06/03/24 1333  BP: (!) 111/54  Weight: 184 lb (83.5 kg)  Height: 5' 1 (1.549 m)  PainSc: 0-No pain   Body mass index is 34.77 kg/m.     06/03/2024    1:33 PM 08/18/2023   12:48 PM 06/30/2023     9:31 AM 06/28/2023   10:10 AM 12/10/2022   11:17 AM 01/09/2022   12:32 PM 01/05/2022    6:35 PM  Advanced Directives  Does Patient Have a Medical Advance Directive? No No No No No No   Would patient like information on creating a medical advance directive? No - Patient declined    No - Patient declined  No - Patient declined    Current Medications (verified) Outpatient Encounter Medications as of 06/03/2024  Medication Sig   albuterol  (VENTOLIN  HFA) 108 (90 Base) MCG/ACT inhaler Inhale 2 puffs into the lungs every 6 (six) hours as needed for wheezing or shortness of breath.   baclofen  (LIORESAL ) 10 MG tablet Take 0.5-1 tablets (5-10 mg total) by mouth at bedtime as needed.   Calcium  Carb-Cholecalciferol (CALCIUM  600+D3) 600-200 MG-UNIT TABS Take 1 tablet by mouth in the morning.   carvedilol  (COREG ) 12.5 MG tablet Take 1 tablet (12.5 mg total) by mouth 2 (two) times daily with a meal.   empagliflozin  (JARDIANCE ) 10 MG TABS tablet Take 1 tablet (10 mg total) by mouth daily before breakfast.   ferrous sulfate  325 (65 FE) MG tablet Take 1 tablet (325 mg total) by mouth daily.   meloxicam  (MOBIC ) 7.5 MG tablet Take 1 tablet by mouth once daily   Multiple Vitamin (MULTIVITAMIN) tablet Take 1 tablet by mouth daily.   omeprazole (PRILOSEC) 20 MG capsule Take 20 mg by mouth daily as needed.   OZEMPIC , 2 MG/DOSE, 8 MG/3ML SOPN INJECT 2MG  SUBCUTANEOUSLY ONCE WEEKLY   rosuvastatin  (CRESTOR ) 10 MG tablet Take 1 tablet (10 mg total) by mouth daily.   sacubitril -valsartan  (  ENTRESTO ) 97-103 MG Take 1 tablet by mouth twice daily   spironolactone  (ALDACTONE ) 25 MG tablet Take 1 tablet (25 mg total) by mouth daily.   vitamin B-12 (CYANOCOBALAMIN ) 500 MCG tablet Take 500 mcg by mouth daily.   No facility-administered encounter medications on file as of 06/03/2024.    Allergies (verified) Tramadol and Penicillins   History: Past Medical History:  Diagnosis Date   Anxiety    Arthritis    Atrial  fibrillation (HCC)    CHF (congestive heart failure) (HCC)    Cholecystitis    Cholecystitis    Diabetes mellitus without complication (HCC)    Dyspnea    GERD (gastroesophageal reflux disease)    Hyperlipidemia    Hypertension    Past Surgical History:  Procedure Laterality Date   CARDIOVERSION N/A 02/13/2022   Procedure: CARDIOVERSION;  Surgeon: Hester Wolm PARAS, MD;  Location: [facility] ORS;  Service: Cardiovascular;  Laterality: N/A;   COLONOSCOPY  2012   IR CHOLANGIOGRAM EXISTING TUBE  12/11/2021   IR PERC CHOLECYSTOSTOMY  11/02/2021   Family History  Problem Relation Age of Onset   COPD Mother    Hypertension Mother    Diabetes Mother    Severe combined immunodeficiency Father    Diabetes Sister    Heart disease Sister    Heart disease Sister    Cancer Niece    Social History   Socioeconomic History   Marital status: Single    Spouse name: Ubaldo   Number of children: Not on file   Years of education: Not on file   Highest education level: Associate degree: occupational, Scientist, product/process development, or vocational program  Occupational History   Not on file  Tobacco Use   Smoking status: Former    Current packs/day: 0.00    Types: Cigarettes    Quit date: 12/07/1975    Years since quitting: 48.5   Smokeless tobacco: Never  Vaping Use   Vaping status: Never Used  Substance and Sexual Activity   Alcohol use: No   Drug use: No   Sexual activity: Not on file  Other Topics Concern   Not on file  Social History Narrative   Not on file   Social Drivers of Health   Financial Resource Strain: Low Risk  (05/30/2024)   Overall Financial Resource Strain (CARDIA)    Difficulty of Paying Living Expenses: Not hard at all  Food Insecurity: No Food Insecurity (05/30/2024)   Hunger Vital Sign    Worried About Running Out of Food in the Last Year: Never true    Ran Out of Food in the Last Year: Never true  Transportation Needs: No Transportation Needs (05/30/2024)   PRAPARE - Therapist, art (Medical): No    Lack of Transportation (Non-Medical): No  Physical Activity: Patient Declined (05/30/2024)   Exercise Vital Sign    Days of Exercise per Week: Patient declined    Minutes of Exercise per Session: Patient declined  Stress: Patient Declined (05/30/2024)   [employer] of Occupational Health - Occupational Stress Questionnaire    Feeling of Stress: Patient declined  Social Connections: Moderately Isolated (05/30/2024)   Social Connection and Isolation Panel    Frequency of Communication with Friends and Family: More than three times a week    Frequency of Social Gatherings with Friends and Family: Twice a week    Attends Religious Services: Patient declined    Database administrator or Organizations: No    Attends Club or  Organization Meetings: Never    Marital Status: Living with partner    Tobacco Counseling Counseling given: Yes    Clinical Intake:  Pre-visit preparation completed: Yes  Pain : No/denies pain Pain Score: 0-No pain     BMI - recorded: 34.77 Nutritional Status: BMI > 30  Obese Diabetes: Yes CBG done?: No (no telehealth visit.) Did pt. bring in CBG monitor from home?: No  Lab Results  Component Value Date   HGBA1C 7.0 (H) 02/03/2024   HGBA1C 7.1 (H) 08/04/2023   HGBA1C 7.4 (H) 04/03/2023     How often do you need to have someone help you when you read instructions, pamphlets, or other written materials from your doctor or pharmacy?: 1 - Never  Interpreter Needed?: No  Information entered by :: [facility]   Activities of Daily Living     05/30/2024    4:02 PM  In your present state of health, do you have any difficulty performing the following activities:  Hearing? 0  Vision? 0  Difficulty concentrating or making decisions? 0  Walking or climbing stairs? 0  Dressing or bathing? 0  Doing errands, shopping? 0  Preparing Food and eating ? N  Using the Toilet? N  In the past six months, have you  accidently leaked urine? N  Do you have problems with loss of bowel control? N  Managing your Medications? N  Managing your Finances? N  Housekeeping or managing your Housekeeping? N    Patient Care Team: Melvin Pao, NP as PCP - General (Nurse Practitioner) Nyle Rankin POUR, [facility] (Inactive) (Pharmacist) Patty, A. Robynn, MD (Ophthalmology) Pa, [facility] Od (Optometry) [place], [place] LABOR, [place] (Family Medicine) Zenaida Morene PARAS, MD as Consulting Physician (Cardiology)  I have updated your Care Teams any recent Medical Services you may have received from other providers in the past year.     Assessment:   This is a routine wellness examination for Amanda [name].  Hearing/Vision screen Hearing Screening - Comments:: Patient denies any hearing difficulties.   Vision Screening - Comments:: Wears rx glasses - up to date with routine eye exams with  Robynn Mill w/ [facility]   Goals Addressed             This Visit's Progress    DIET - EAT MORE FRUITS AND VEGETABLES   On track    DIET - INCREASE WATER INTAKE   On track      Depression Screen     06/03/2024    1:36 PM 02/03/2024    8:14 AM 09/05/2023    8:46 AM 08/04/2023   10:31 AM 07/02/2023    9:36 AM 04/03/2023    9:44 AM 01/01/2023    9:10 AM  PHQ 2/9 Scores  PHQ - 2 Score 0 0 0 0 0 0 0  PHQ- 9 Score 0 0 0 0 1 0 0    Fall Risk     05/30/2024    4:02 PM 02/03/2024    8:14 AM 08/04/2023   10:31 AM 07/02/2023    9:36 AM 04/03/2023    9:44 AM  Fall Risk   Falls in the past year? 1 0 0 0 0  Number falls in past yr: 0 0 0 0 0  Injury with Fall? 0 0 0 0 0  Risk for fall due to : History of fall(s) No Fall Risks No Fall Risks No Fall Risks No Fall Risks  Follow up Falls evaluation completed;Education provided;Falls prevention discussed  Falls evaluation completed Falls evaluation completed Falls evaluation completed Falls evaluation completed    MEDICARE RISK AT HOME:  Medicare Risk at Home Any stairs  in or around the home?: (Patient-Rptd) Yes If so, are there any without handrails?: (Patient-Rptd) No Home free of loose throw rugs in walkways, pet beds, electrical cords, etc?: (Patient-Rptd) Yes Adequate lighting in your home to reduce risk of falls?: (Patient-Rptd) Yes Life alert?: (Patient-Rptd) No Use of a cane, walker or w/c?: (Patient-Rptd) No Grab bars in the bathroom?: (Patient-Rptd) Yes Shower chair or bench in shower?: (Patient-Rptd) Yes Elevated toilet seat or a handicapped toilet?: (Patient-Rptd) No  TIMED UP AND GO:  Was the test performed?  No  Cognitive Function: 6CIT completed        06/03/2024    1:39 PM 12/10/2022   11:22 AM  6CIT Screen  What Year? 0 points 0 points  What month? 0 points 0 points  What time? 0 points 0 points  Count back from 20 0 points 0 points  Months in reverse 0 points 0 points  Repeat phrase 0 points 0 points  Total Score 0 points 0 points    Immunizations Immunization History  Administered Date(s) Administered   Fluad Quad(high Dose 65+) 09/03/2021, 07/30/2022   Fluad Trivalent(High Dose 65+) 08/04/2023   Influenza,inj,Quad PF,6+ Mos 09/16/2016, 07/16/2017, 07/22/2018, 08/18/2019, 08/01/2020   Moderna Sars-Covid-2 Vaccination 12/18/2019, 01/15/2020, 10/16/2020   PNEUMOCOCCAL CONJUGATE-20 10/02/2022   Pneumococcal Conjugate-13 12/25/2020   Pneumococcal Polysaccharide-23 10/26/2002   Tdap 03/16/2013    Screening Tests Health Maintenance  Topic Date Due   Zoster Vaccines- Shingrix (1 of 2) Never done   Colonoscopy  03/03/2021   COVID-19 Vaccine (4 - 2024-25 season) 06/22/2023   OPHTHALMOLOGY EXAM  10/02/2023   Diabetic kidney evaluation - Urine ACR  10/03/2023   FOOT EXAM  01/01/2024   INFLUENZA VACCINE  05/21/2024   DTaP/Tdap/Td (2 - Td or Tdap) 08/03/2024 (Originally 03/17/2023)   MAMMOGRAM  02/02/2025 (Originally 08/26/2005)   HEMOGLOBIN A1C  08/04/2024   Diabetic kidney evaluation - eGFR measurement  04/15/2025    Medicare Annual Wellness (AWV)  06/03/2025   DEXA SCAN  01/23/2031   Pneumococcal Vaccine: 50+ Years  Completed   Hepatitis C Screening  Completed   HPV VACCINES  Aged Out   Meningococcal B Vaccine  Aged Out   Pneumococcal Vaccine  Discontinued    Health Maintenance  Health Maintenance Due  Topic Date Due   Zoster Vaccines- Shingrix (1 of 2) Never done   Colonoscopy  03/03/2021   COVID-19 Vaccine (4 - 2024-25 season) 06/22/2023   OPHTHALMOLOGY EXAM  10/02/2023   Diabetic kidney evaluation - Urine ACR  10/03/2023   FOOT EXAM  01/01/2024   INFLUENZA VACCINE  05/21/2024   Health Maintenance Items Addressed: DEXA ordered, Cologuard Ordered, Diabetic Foot Exam recommended urine acr  Additional Screening:  Vision Screening: Recommended annual ophthalmology exams for early detection of glaucoma and other disorders of the eye. Would you like a referral to an eye doctor? No    Dental Screening: Recommended annual dental exams for proper oral hygiene  Community Resource Referral / Chronic Care Management: CRR required this visit?  No   CCM required this visit?  No   Plan:    I have personally reviewed and noted the following in the patient's chart:   Medical and social history Use of alcohol, tobacco or illicit drugs  Current medications and supplements including opioid prescriptions. Patient is not currently taking opioid prescriptions.  Functional ability and status Nutritional status Physical activity Advanced directives List of other physicians Hospitalizations, surgeries, and ER visits in previous 12 months Vitals Screenings to include cognitive, depression, and falls Referrals and appointments  In addition, I have reviewed and discussed with patient certain preventive protocols, quality metrics, and best practice recommendations. A written personalized care plan for preventive services as well as general preventive health recommendations were provided to  patient.   Bret Vanessen, CMA   06/03/2024   After Visit Summary: (MyChart) Due to this being a telephonic visit, the after visit summary with patients personalized plan was offered to patient via MyChart   Notes: Nothing significant to report at this time.

## 2024-06-18 ENCOUNTER — Ambulatory Visit
Admission: RE | Admit: 2024-06-18 | Discharge: 2024-06-18 | Disposition: A | Discharge status: Home / Self Care | Source: Ambulatory Visit | Attending: Family | Admitting: Family

## 2024-06-18 ENCOUNTER — Other Ambulatory Visit
Admission: RE | Admit: 2024-06-18 | Discharge: 2024-06-18 | Disposition: A | Discharge status: Home / Self Care | Source: Ambulatory Visit | Attending: Family | Admitting: Family

## 2024-06-18 DIAGNOSIS — I499 Cardiac arrhythmia, unspecified: Secondary | ICD-10-CM | POA: Insufficient documentation

## 2024-06-18 DIAGNOSIS — I110 Hypertensive heart disease with heart failure: Secondary | ICD-10-CM | POA: Insufficient documentation

## 2024-06-18 DIAGNOSIS — E119 Type 2 diabetes mellitus without complications: Secondary | ICD-10-CM | POA: Diagnosis not present

## 2024-06-18 DIAGNOSIS — I5032 Chronic diastolic (congestive) heart failure: Secondary | ICD-10-CM

## 2024-06-18 DIAGNOSIS — I4891 Unspecified atrial fibrillation: Secondary | ICD-10-CM | POA: Insufficient documentation

## 2024-06-18 DIAGNOSIS — Z1211 Encounter for screening for malignant neoplasm of colon: Secondary | ICD-10-CM | POA: Diagnosis not present

## 2024-06-18 LAB — ECHOCARDIOGRAM COMPLETE
AR max vel: 3.12 cm2
AV Area VTI: 3.48 cm2
AV Area mean vel: 3.22 cm2
AV Mean grad: 1 mmHg
AV Peak grad: 2.3 mmHg
Ao pk vel: 0.76 m/s
Area-P 1/2: 3.43 cm2
MV VTI: 2.26 cm2
S' Lateral: 2.2 cm

## 2024-06-18 NOTE — Progress Notes (Signed)
*  PRELIMINARY RESULTS* Echocardiogram 2D Echocardiogram has been performed.  Amanda [name] 06/18/2024, 10:40 AM

## 2024-06-19 LAB — MICROALBUMIN / CREATININE URINE RATIO
Creatinine, Urine: 30 mg/dL
Microalb Creat Ratio: 120 mg/g{creat} — ABNORMAL HIGH (ref 0–29)
Microalb, Ur: 35.9 ug/mL — ABNORMAL HIGH

## 2024-06-24 ENCOUNTER — Ambulatory Visit: Payer: Self-pay | Admitting: Nurse Practitioner

## 2024-06-24 ENCOUNTER — Other Ambulatory Visit: Payer: Self-pay

## 2024-06-24 LAB — COLOGUARD: COLOGUARD: NEGATIVE

## 2024-06-28 ENCOUNTER — Other Ambulatory Visit: Payer: Self-pay | Admitting: Nurse Practitioner

## 2024-06-29 NOTE — Telephone Encounter (Signed)
 Requested medications are due for refill today.  yes  Requested medications are on the active medications list.  yes  Last refill. 01/01/2024 #180 1 rf  Future visit scheduled.   yes  Notes to clinic.  Medication not assigned to a protocol. Please review for refill.    Requested Prescriptions  Pending Prescriptions Disp Refills   sacubitril -valsartan  (ENTRESTO ) 97-103 MG [Pharmacy Med Name: SACUB/VALSAR 97-103MG  TAB] 180 tablet 0    Sig: Take 1 tablet by mouth twice daily     Off-Protocol Failed - 06/29/2024  2:37 PM      Failed - Medication not assigned to a protocol, review manually.      Passed - Valid encounter within last 12 months    Recent Outpatient Visits           4 months ago Stage 3a chronic kidney disease [facility])   [place] [facility] Melvin Pao, NP   5 months ago Osteoarthritis of spine with radiculopathy, cervical region   [facility] [place], [place], DO

## 2024-07-10 ENCOUNTER — Other Ambulatory Visit: Payer: Self-pay | Admitting: Nurse Practitioner

## 2024-07-12 NOTE — Telephone Encounter (Signed)
 Requested medication (s) are due for refill today:   Yes  Requested medication (s) are on the active medication list:   Yes  Future visit scheduled:   Yes 10/22 with Darice   Last ordered: 05/17/2024 6 ml, 0 refills  Labs are due in Oct.  Has upcoming appt.     Requested Prescriptions  Pending Prescriptions Disp Refills   OZEMPIC , 2 MG/DOSE, 8 MG/3ML SOPN [Pharmacy Med Name: Ozempic  (2 MG/DOSE) 8 MG/3ML Subcutaneous Solution Pen-injector] 6 mL 0    Sig: INJECT 2MG  SUBCUTANEOUSLY ONCE WEEKLY     Endocrinology:  Diabetes - GLP-1 Receptor Agonists - semaglutide  Failed - 07/12/2024  1:54 PM      Failed - HBA1C in normal range and within 180 days    Hemoglobin A1C  Date Value Ref Range Status  06/12/2016 7.8  Final   HB A1C (BAYER DCA - WAIVED)  Date Value Ref Range Status  01/22/2022 6.9 (H) 4.8 - 5.6 % Final    Comment:             Prediabetes: 5.7 - 6.4          Diabetes: >6.4          Glycemic control for adults with diabetes: <7.0    Hgb A1c MFr Bld  Date Value Ref Range Status  02/03/2024 7.0 (H) 4.8 - 5.6 % Final    Comment:             Prediabetes: 5.7 - 6.4          Diabetes: >6.4          Glycemic control for adults with diabetes: <7.0          Failed - Cr in normal range and within 360 days    Creatinine, Ser  Date Value Ref Range Status  04/15/2024 1.25 (H) 0.57 - 1.00 mg/dL Final         Passed - Valid encounter within last 6 months    Recent Outpatient Visits           5 months ago Stage 3a chronic kidney disease (HCC)   [place] [facility] Melvin Darice, NP   5 months ago Osteoarthritis of spine with radiculopathy, cervical region   [facility] [place], [place], DO

## 2024-08-02 ENCOUNTER — Other Ambulatory Visit: Payer: Self-pay | Admitting: Nurse Practitioner

## 2024-08-03 NOTE — Telephone Encounter (Signed)
 Requested Prescriptions  Pending Prescriptions Disp Refills   carvedilol  (COREG ) 12.5 MG tablet [Pharmacy Med Name: Carvedilol  12.5 MG Oral Tablet] 180 tablet 0    Sig: TAKE 1 TABLET BY MOUTH TWICE DAILY WITH A MEAL     Cardiovascular: Beta Blockers 3 Failed - 08/03/2024  4:06 PM      Failed - Cr in normal range and within 360 days    Creatinine, Ser  Date Value Ref Range Status  04/15/2024 1.25 (H) 0.57 - 1.00 mg/dL Final         Passed - AST in normal range and within 360 days    AST  Date Value Ref Range Status  02/03/2024 22 0 - 40 IU/L Final   AST (SGOT) Piccolo, Waived  Date Value Ref Range Status  07/16/2017 28 11 - 38 U/L Final         Passed - ALT in normal range and within 360 days    ALT  Date Value Ref Range Status  02/03/2024 15 0 - 32 IU/L Final   ALT (SGPT) Piccolo, Waived  Date Value Ref Range Status  07/16/2017 33 10 - 47 U/L Final         Passed - Last BP in normal range    BP Readings from Last 1 Encounters:  06/03/24 (!) 111/54         Passed - Last Heart Rate in normal range    Pulse Readings from Last 1 Encounters:  04/15/24 69         Passed - Valid encounter within last 6 months    Recent Outpatient Visits           6 months ago Stage 3a chronic kidney disease (HCC)   [place] [facility] Melvin Pao, NP   6 months ago Osteoarthritis of spine with radiculopathy, cervical region   [facility], Megan P, DO               rosuvastatin  (CRESTOR ) 10 MG tablet [Pharmacy Med Name: ROSUVASTATIN  10MG  TAB] 90 tablet 0    Sig: Take 1 tablet by mouth once daily     Cardiovascular:  Antilipid - Statins 2 Failed - 08/03/2024  4:06 PM      Failed - Cr in normal range and within 360 days    Creatinine, Ser  Date Value Ref Range Status  04/15/2024 1.25 (H) 0.57 - 1.00 mg/dL Final         Failed - Lipid Panel in normal range within the last 12 months    Cholesterol, Total  Date Value Ref  Range Status  02/03/2024 148 100 - 199 mg/dL Final   Cholesterol Piccolo, Waived  Date Value Ref Range Status  07/16/2017 196 <200 mg/dL Final    Comment:                            Desirable                <200                         Borderline High      200- 239                         High                     >  239    LDL Chol Calc (NIH)  Date Value Ref Range Status  02/03/2024 72 0 - 99 mg/dL Final   HDL  Date Value Ref Range Status  02/03/2024 52 >39 mg/dL Final   Triglycerides  Date Value Ref Range Status  02/03/2024 136 0 - 149 mg/dL Final   Triglycerides Piccolo,Waived  Date Value Ref Range Status  07/16/2017 301 (H) <150 mg/dL Final    Comment:                            Normal                   <150                         Borderline High     150 - 199                         High                200 - 499                         Very High                >499          Passed - Patient is not pregnant      Passed - Valid encounter within last 12 months    Recent Outpatient Visits           6 months ago Stage 3a chronic kidney disease (HCC)   [place] [facility] Melvin Pao, NP   6 months ago Osteoarthritis of spine with radiculopathy, cervical region   [facility] [place], [place], DO

## 2024-08-11 ENCOUNTER — Encounter: Payer: Self-pay | Admitting: Nurse Practitioner

## 2024-08-11 ENCOUNTER — Ambulatory Visit: Admitting: Nurse Practitioner

## 2024-08-11 VITALS — BP 107/72 | HR 69 | Temp 97.5°F | Ht 61.0 in | Wt 186.6 lb

## 2024-08-11 DIAGNOSIS — D61818 Other pancytopenia: Secondary | ICD-10-CM

## 2024-08-11 DIAGNOSIS — I152 Hypertension secondary to endocrine disorders: Secondary | ICD-10-CM

## 2024-08-11 DIAGNOSIS — I480 Paroxysmal atrial fibrillation: Secondary | ICD-10-CM

## 2024-08-11 DIAGNOSIS — Z23 Encounter for immunization: Secondary | ICD-10-CM | POA: Diagnosis not present

## 2024-08-11 DIAGNOSIS — E785 Hyperlipidemia, unspecified: Secondary | ICD-10-CM

## 2024-08-11 DIAGNOSIS — I5032 Chronic diastolic (congestive) heart failure: Secondary | ICD-10-CM

## 2024-08-11 DIAGNOSIS — E1159 Type 2 diabetes mellitus with other circulatory complications: Secondary | ICD-10-CM | POA: Diagnosis not present

## 2024-08-11 DIAGNOSIS — E1165 Type 2 diabetes mellitus with hyperglycemia: Secondary | ICD-10-CM

## 2024-08-11 DIAGNOSIS — Z0000 Encounter for general adult medical examination without abnormal findings: Secondary | ICD-10-CM

## 2024-08-11 DIAGNOSIS — N1831 Chronic kidney disease, stage 3a: Secondary | ICD-10-CM

## 2024-08-11 DIAGNOSIS — E1169 Type 2 diabetes mellitus with other specified complication: Secondary | ICD-10-CM

## 2024-08-11 LAB — MICROALBUMIN, URINE WAIVED
Creatinine, Urine Waived: 50 mg/dL (ref 10–300)
Microalb, Ur Waived: 150 mg/L — ABNORMAL HIGH (ref 0–19)
Microalb/Creat Ratio: >300 mg/g — ABNORMAL HIGH (ref <30)

## 2024-08-11 NOTE — Assessment & Plan Note (Signed)
 Chronic. Lipid panel drawn in clinic today. Continue Rosuvastatin  10mg  daily.  Can increase to 20mg .  Follow up in 6 months.  Call sooner if concerns arise.

## 2024-08-11 NOTE — Assessment & Plan Note (Signed)
 Chronic.  Controlled.  Continue with current medication regimen.  Followed by the HF clinic.  Following up with Cardiology annually.  Reviewed recent HF note.  Now only taking Lasix  PRN due to decline in kidney function and Low Potassium.  Hasn't needed to take any for swelling or weight gain.   Labs ordered today.  Return to clinic in 6 months for reevaluation.  Call sooner if concerns arise.   - Reminded to call for an overnight weight gain of >2 pounds or a weekly weight gain of >5 pounds - not adding salt to food and read food labels. Reviewed the importance of keeping daily sodium intake to 2000mg  daily. - Avoid Ibuprofen products.

## 2024-08-11 NOTE — Assessment & Plan Note (Signed)
 Chronic.  Controlled.  Continue with current medication regimen.  Labs ordered today.  Return to clinic in 6 months for reevaluation.  Call sooner if concerns arise.  ? ?

## 2024-08-11 NOTE — Assessment & Plan Note (Signed)
 Chronic.  Controlled.  Blood pressures at home running 120-130/60. Continue with Carvedilol  and Entresto .  No changes made to current regimen. Continue to check blood pressures at home and bring log to visits.  Labs ordered today. Will make recommendations based on lab results. Follow up in 6 months.  Call sooner if concerns arise.

## 2024-08-11 NOTE — Assessment & Plan Note (Signed)
 Chronic. Well controlled. Pt tolerating ozempic . Denies any adverse side effects at this time.  A1c well controlled at 7.0%.   Eye exam and microalbumin up to date.  Continue with current medication regimen of Ozempic  and Jardiance .  Follow up in 6 months.  Call sooner if concerns arise.

## 2024-08-11 NOTE — Assessment & Plan Note (Signed)
 Chronic. Labs ordered at visit today.  Will make recommendations based on lab results. Continue with Jardiance.  Creatinine improved with stopping daily lasix- she remains off of lasix without swelling.

## 2024-08-11 NOTE — Assessment & Plan Note (Signed)
Pt in normal rate/rhythm in office. Patient states she was taken off Amiodarone and Eliquis by Dr. Gwen Pounds.  Will continue to monitor at future visits.  Follow up in 6 months.  Call sooner if concerns arise.

## 2024-08-11 NOTE — Progress Notes (Signed)
 BP 107/72 Comment: home blood pressure reading  Pulse 69   Temp (!) 97.5 F (36.4 C) (Oral)   Ht 5' 1 (1.549 m)   Wt 186 lb 9.6 oz (84.6 kg)   SpO2 (!) 69%   BMI 35.26 kg/m    Subjective:    Patient ID: Amanda [name], female    DOB: 01-10-1956, 68 y.o.   MRN: [id number]  HPI: Amanda [name] is a 68 y.o. female presenting on 08/11/2024 for comprehensive medical examination. Current medical complaints include:none  She currently lives with: Menopausal Symptoms: no  HYPERTENSION / HYPERLIPIDEMIA/HF Patient is weighing several times a week.   She was told to stop the Lasix  and Potassium and only take as needed for swelling due to decline in kidney function.  Has not needed to take any.  Satisfied with current treatment? yes Duration of hypertension: chronic BP monitoring frequency: a few times a week BP range: 110/75-80 BP medication side effects: no Past BP meds: carvedilol , entresto  Duration of hyperlipidemia: chronic Cholesterol medication side effects: no Cholesterol supplements: none Past cholesterol medications: Crestor  Medication compliance: Compliant Aspirin: no Recent stressors: no Recurrent headaches: no Visual changes: no Palpitations: no Dyspnea: no Chest pain: no Lower extremity edema: no Dizzy/lightheaded: no   DIABETES Doing well with Ozempic  2mg  and Jardiance .  Denies side effects Hypoglycemic episodes:no Polydipsia/polyuria: no Visual disturbance: no Chest pain: no Paresthesias: no Glucose Monitoring: no  Accucheck frequency: 140s  Fasting glucose:   Post prandial:  Evening:  Before meals: Taking Insulin ?: no  Long acting insulin :  Short acting insulin : Blood Pressure Monitoring: a few times a week Retinal Examination: Up to date;  Foot Exam: Up to Date;  Diabetic Education: Completed Pneumovax: up to date Influenza: Not up to Date Aspirin: no  CHRONIC KIDNEY DISEASE CKD status: stable Medications renally dose: yes Previous renal  evaluation: yes; Pneumovax:  Not up to Date Influenza Vaccine:  Not up to Date  ANEMIA Anemia status: stable Etiology of anemia: Duration of anemia treatment:  Compliance with treatment: excellent compliance Iron supplementation side effects: no Severity of anemia: mild Fatigue: yes Decreased exercise tolerance: no  Dyspnea on exertion: no Palpitations: no Bleeding: no Pica: no  Depression Screen done today and results listed below:     08/11/2024    8:07 AM 06/03/2024    1:36 PM 02/03/2024    8:14 AM 09/05/2023    8:46 AM 08/04/2023   10:31 AM  Depression screen PHQ 2/9  Decreased Interest 0 0 0 0 0  Down, Depressed, Hopeless 0 0 0 0 0  PHQ - 2 Score 0 0 0 0 0  Altered sleeping 0 0 0 0 0  Tired, decreased energy 1 0 0 0 0  Change in appetite 0 0 0 0 0  Feeling bad or failure about yourself  0 0 0 0 0  Trouble concentrating 0 0 0 0 0  Moving slowly or fidgety/restless 0 0 0 0 0  Suicidal thoughts 0 0 0 0 0  PHQ-9 Score 1 0 0 0 0  Difficult doing work/chores Not difficult at all Not difficult at all Not difficult at all  Not difficult at all    The patient does not have a history of falls. I did complete a risk assessment for falls. A plan of care for falls was documented.   Past Medical History:  Past Medical History:  Diagnosis Date   Anxiety    Arthritis    Atrial fibrillation (HCC)  CHF (congestive heart failure) (HCC)    Cholecystitis    Cholecystitis    Diabetes mellitus without complication (HCC)    Dyspnea    GERD (gastroesophageal reflux disease)    Hyperlipidemia    Hypertension     Surgical History:  Past Surgical History:  Procedure Laterality Date   CARDIOVERSION N/A 02/13/2022   Procedure: CARDIOVERSION;  Surgeon: Hester Wolm PARAS, MD;  Location: [facility] ORS;  Service: Cardiovascular;  Laterality: N/A;   COLONOSCOPY  2012   IR CHOLANGIOGRAM EXISTING TUBE  12/11/2021   IR PERC CHOLECYSTOSTOMY  11/02/2021    Medications:  Current Outpatient  Medications on File Prior to Visit  Medication Sig   albuterol  (VENTOLIN  HFA) 108 (90 Base) MCG/ACT inhaler Inhale 2 puffs into the lungs every 6 (six) hours as needed for wheezing or shortness of breath.   baclofen  (LIORESAL ) 10 MG tablet Take 0.5-1 tablets (5-10 mg total) by mouth at bedtime as needed.   Calcium  Carb-Cholecalciferol (CALCIUM  600+D3) 600-200 MG-UNIT TABS Take 1 tablet by mouth in the morning.   carvedilol  (COREG ) 12.5 MG tablet TAKE 1 TABLET BY MOUTH TWICE DAILY WITH A MEAL   empagliflozin  (JARDIANCE ) 10 MG TABS tablet Take 1 tablet (10 mg total) by mouth daily before breakfast.   ferrous sulfate  325 (65 FE) MG tablet Take 1 tablet (325 mg total) by mouth daily.   meloxicam  (MOBIC ) 7.5 MG tablet Take 1 tablet by mouth once daily   Multiple Vitamin (MULTIVITAMIN) tablet Take 1 tablet by mouth daily.   omeprazole (PRILOSEC) 20 MG capsule Take 20 mg by mouth daily as needed.   OZEMPIC , 2 MG/DOSE, 8 MG/3ML SOPN INJECT 2MG  SUBCUTANEOUSLY ONCE WEEKLY   rosuvastatin  (CRESTOR ) 10 MG tablet Take 1 tablet by mouth once daily   sacubitril -valsartan  (ENTRESTO ) 97-103 MG Take 1 tablet by mouth twice daily   spironolactone  (ALDACTONE ) 25 MG tablet Take 1 tablet (25 mg total) by mouth daily.   vitamin B-12 (CYANOCOBALAMIN ) 500 MCG tablet Take 500 mcg by mouth daily.   No current facility-administered medications on file prior to visit.    Allergies:  Allergies  Allergen Reactions   Tramadol Other (See Comments)    woozy   Penicillins Other (See Comments)    Dizziness Tolerated augmentin but does not like taste    Social History:  Social History   Socioeconomic History   Marital status: Single    Spouse name: Ubaldo   Number of children: Not on file   Years of education: Not on file   Highest education level: Associate degree: occupational, Scientist, product/process development, or vocational program  Occupational History   Not on file  Tobacco Use   Smoking status: Former    Current packs/day:  0.00    Types: Cigarettes    Quit date: 12/07/1975    Years since quitting: 48.7   Smokeless tobacco: Never  Vaping Use   Vaping status: Never Used  Substance and Sexual Activity   Alcohol use: No   Drug use: No   Sexual activity: Not on file  Other Topics Concern   Not on file  Social History Narrative   Not on file   Social Drivers of Health   Financial Resource Strain: Low Risk  (05/30/2024)   Overall Financial Resource Strain (CARDIA)    Difficulty of Paying Living Expenses: Not hard at all  Food Insecurity: No Food Insecurity (05/30/2024)   Hunger Vital Sign    Worried About Running Out of Food in the Last Year: Never true  Ran Out of Food in the Last Year: Never true  Transportation Needs: No Transportation Needs (05/30/2024)   PRAPARE - Administrator, Civil Service (Medical): No    Lack of Transportation (Non-Medical): No  Physical Activity: Patient Declined (05/30/2024)   Exercise Vital Sign    Days of Exercise per Week: Patient declined    Minutes of Exercise per Session: Patient declined  Stress: Patient Declined (05/30/2024)   [employer] of Occupational Health - Occupational Stress Questionnaire    Feeling of Stress: Patient declined  Social Connections: Moderately Isolated (05/30/2024)   Social Connection and Isolation Panel    Frequency of Communication with Friends and Family: More than three times a week    Frequency of Social Gatherings with Friends and Family: Twice a week    Attends Religious Services: Patient declined    Database administrator or Organizations: No    Attends Banker Meetings: Never    Marital Status: Living with partner  Intimate Partner Violence: Not At Risk (06/03/2024)   Humiliation, Afraid, Rape, and Kick questionnaire    Fear of Current or Ex-Partner: No    Emotionally Abused: No    Physically Abused: No    Sexually Abused: No   Social History   Tobacco Use  Smoking Status Former   Current  packs/day: 0.00   Types: Cigarettes   Quit date: 12/07/1975   Years since quitting: 48.7  Smokeless Tobacco Never   Social History   Substance and Sexual Activity  Alcohol Use No    Family History:  Family History  Problem Relation Age of Onset   COPD Mother    Hypertension Mother    Diabetes Mother    Severe combined immunodeficiency Father    Diabetes Sister    Heart disease Sister    Heart disease Sister    Cancer Niece     Past medical history, surgical history, medications, allergies, family history and social history reviewed with patient today and changes made to appropriate areas of the chart.   Review of Systems  HENT:         Denies vision changes.  Eyes:  Negative for blurred vision and double vision.  Respiratory:  Negative for shortness of breath.   Cardiovascular:  Negative for chest pain, palpitations and leg swelling.  Neurological:  Negative for dizziness, tingling and headaches.  Endo/Heme/Allergies:  Negative for polydipsia.       Denies Polyuria   All other ROS negative except what is listed above and in the HPI.      Objective:    BP 107/72 Comment: home blood pressure reading  Pulse 69   Temp (!) 97.5 F (36.4 C) (Oral)   Ht 5' 1 (1.549 m)   Wt 186 lb 9.6 oz (84.6 kg)   SpO2 (!) 69%   BMI 35.26 kg/m   Wt Readings from Last 3 Encounters:  08/11/24 186 lb 9.6 oz (84.6 kg)  06/03/24 184 lb (83.5 kg)  04/15/24 184 lb (83.5 kg)    Physical Exam Vitals and nursing note reviewed.  Constitutional:      General: She is awake. She is not in acute distress.    Appearance: Normal appearance. She is well-developed. She is obese. She is not ill-appearing.  HENT:     Head: Normocephalic and atraumatic.     Right Ear: Hearing, tympanic membrane, ear canal and external ear normal. No drainage.     Left Ear: Hearing, tympanic membrane, ear canal  and external ear normal. No drainage.     Nose: Nose normal.     Right Sinus: No maxillary sinus  tenderness or frontal sinus tenderness.     Left Sinus: No maxillary sinus tenderness or frontal sinus tenderness.     Mouth/Throat:     Mouth: Mucous membranes are moist.     Pharynx: Oropharynx is clear. Uvula midline. No pharyngeal swelling, oropharyngeal exudate or posterior oropharyngeal erythema.  Eyes:     General: Lids are normal.        Right eye: No discharge.        Left eye: No discharge.     Extraocular Movements: Extraocular movements intact.     Conjunctiva/sclera: Conjunctivae normal.     Pupils: Pupils are equal, round, and reactive to light.     Visual Fields: Right eye visual fields normal and left eye visual fields normal.  Neck:     Thyroid : No thyromegaly.     Vascular: No carotid bruit.     Trachea: Trachea normal.  Cardiovascular:     Rate and Rhythm: Normal rate and regular rhythm.     Heart sounds: Normal heart sounds. No murmur heard.    No gallop.  Pulmonary:     Effort: Pulmonary effort is normal. No accessory muscle usage or respiratory distress.     Breath sounds: Normal breath sounds.  Chest:  Breasts:    Right: Normal.     Left: Normal.  Abdominal:     General: Bowel sounds are normal.     Palpations: Abdomen is soft. There is no hepatomegaly or splenomegaly.     Tenderness: There is no abdominal tenderness.  Musculoskeletal:        General: Normal range of motion.     Cervical back: Normal range of motion and neck supple.     Right lower leg: No edema.     Left lower leg: No edema.  Lymphadenopathy:     Head:     Right side of head: No submental, submandibular, tonsillar, preauricular or posterior auricular adenopathy.     Left side of head: No submental, submandibular, tonsillar, preauricular or posterior auricular adenopathy.     Cervical: No cervical adenopathy.     Upper Body:     Right upper body: No supraclavicular, axillary or pectoral adenopathy.     Left upper body: No supraclavicular, axillary or pectoral adenopathy.  Skin:     General: Skin is warm and dry.     Capillary Refill: Capillary refill takes less than 2 seconds.     Findings: No rash.  Neurological:     Mental Status: She is alert and oriented to person, place, and time.     Gait: Gait is intact.  Psychiatric:        Attention and Perception: Attention normal.        Mood and Affect: Mood normal.        Speech: Speech normal.        Behavior: Behavior normal. Behavior is cooperative.        Thought Content: Thought content normal.        Judgment: Judgment normal.     Results for orders placed or performed during the hospital encounter of 06/18/24  Microalbumin / creatinine urine ratio   Collection Time: 06/18/24  9:47 AM  Result Value Ref Range   Microalb, Ur 35.9 (H) Not Estab. ug/mL   Microalb Creat Ratio 120 (H) 0 - 29 mg/g creat   Creatinine, Urine  30.0 Not Estab. mg/dL      Assessment & Plan:   Problem List Items Addressed This Visit       Cardiovascular and Mediastinum   Hypertension associated with diabetes (HCC)   Chronic.  Controlled.  Blood pressures at home running 120-130/60. Continue with Carvedilol  and Entresto .  No changes made to current regimen. Continue to check blood pressures at home and bring log to visits.  Labs ordered today. Will make recommendations based on lab results. Follow up in 6 months.  Call sooner if concerns arise.       AF (paroxysmal atrial fibrillation) (HCC)   Pt in normal rate/rhythm in office. Patient states she was taken off Amiodarone and Eliquis  by Dr. Hester.  Will continue to monitor at future visits.  Follow up in 6 months.  Call sooner if concerns arise.       Chronic diastolic CHF (congestive heart failure), NYHA class 2 (HCC)   Chronic.  Controlled.  Continue with current medication regimen.  Followed by the HF clinic.  Following up with Cardiology annually.  Reviewed recent HF note.  Now only taking Lasix  PRN due to decline in kidney function and Low Potassium.  Hasn't needed to take  any for swelling or weight gain.   Labs ordered today.  Return to clinic in 6 months for reevaluation.  Call sooner if concerns arise.   - Reminded to call for an overnight weight gain of >2 pounds or a weekly weight gain of >5 pounds - not adding salt to food and read food labels. Reviewed the importance of keeping daily sodium intake to 2000mg  daily. - Avoid Ibuprofen products.        Endocrine   Hyperlipidemia associated with type 2 diabetes mellitus (HCC)   Chronic. Lipid panel drawn in clinic today. Continue Rosuvastatin  10mg  daily.  Can increase to 20mg .  Follow up in 6 months.  Call sooner if concerns arise.       Relevant Orders   Lipid panel   Uncontrolled type 2 diabetes mellitus with hyperglycemia, without long-term current use of insulin  (HCC)   Chronic. Well controlled. Pt tolerating ozempic . Denies any adverse side effects at this time.  A1c well controlled at 7.0%.   Eye exam and microalbumin up to date.  Continue with current medication regimen of Ozempic  and Jardiance .  Follow up in 6 months.  Call sooner if concerns arise.       Relevant Orders   Microalbumin, Urine Waived   Hemoglobin A1c     Genitourinary   CKD (chronic kidney disease) stage 3, GFR 30-59 ml/min (HCC)   Chronic. Labs ordered at visit today.  Will make recommendations based on lab results. Continue with Jardiance .  Creatinine improved with stopping daily lasix - she remains off of lasix  without swelling.      Relevant Orders   Comprehensive metabolic panel with GFR     Hematopoietic and Hemostatic   Pancytopenia (HCC)   Chronic.  Controlled.  Continue with current medication regimen.  Labs ordered today.  Return to clinic in 6 months for reevaluation.  Call sooner if concerns arise.        Other Visit Diagnoses       Annual physical exam    -  Primary   Health maintenance reviewed during visit today.  Labs ordered.  Vaccines reviewed.  Cologuard up to date.   Relevant Orders   CBC with  Differential/Platelet   Comprehensive metabolic panel with GFR   Lipid panel  TSH   Microalbumin, Urine Waived   Hemoglobin A1c     Need for influenza vaccination       Relevant Orders   Flu vaccine HIGH DOSE PF(Fluzone Trivalent) (Completed)        Follow up plan: Return in about 6 months (around 02/09/2025) for HTN, HLD, DM2 FU.   LABORATORY TESTING:  - Pap smear: not applicable  IMMUNIZATIONS:   - Tdap: Tetanus vaccination status reviewed: not up to date. - Influenza: Up to date - Pneumovax: Up to date - Prevnar: Up to date - COVID: Not applicable - HPV: Not applicable - Shingrix vaccine: Will get in a couple of months  SCREENING: -Mammogram: Refused  - Colonoscopy: cologuard ordered  - Bone Density: Not applicable  -Hearing Test: Not applicable  -Spirometry: Not applicable   PATIENT COUNSELING:   Advised to take 1 mg of folate supplement per day if capable of pregnancy.   Sexuality: Discussed sexually transmitted diseases, partner selection, use of condoms, avoidance of unintended pregnancy  and contraceptive alternatives.   Advised to avoid cigarette smoking.  I discussed with the patient that most people either abstain from alcohol or drink within safe limits (<=14/week and <=4 drinks/occasion for males, <=7/weeks and <= 3 drinks/occasion for females) and that the risk for alcohol disorders and other health effects rises proportionally with the number of drinks per week and how often a drinker exceeds daily limits.  Discussed cessation/primary prevention of drug use and availability of treatment for abuse.   Diet: Encouraged to adjust caloric intake to maintain  or achieve ideal body weight, to reduce intake of dietary saturated fat and total fat, to limit sodium intake by avoiding high sodium foods and not adding table salt, and to maintain adequate dietary potassium and calcium  preferably from fresh fruits, vegetables, and low-fat dairy products.    stressed  the importance of regular exercise  Injury prevention: Discussed safety belts, safety helmets, smoke detector, smoking near bedding or upholstery.   Dental health: Discussed importance of regular tooth brushing, flossing, and dental visits.    NEXT PREVENTATIVE PHYSICAL DUE IN 1 YEAR. Return in about 6 months (around 02/09/2025) for HTN, HLD, DM2 FU.

## 2024-08-12 ENCOUNTER — Ambulatory Visit: Payer: Self-pay | Admitting: Nurse Practitioner

## 2024-08-12 DIAGNOSIS — I480 Paroxysmal atrial fibrillation: Secondary | ICD-10-CM | POA: Diagnosis not present

## 2024-08-12 DIAGNOSIS — I10 Essential (primary) hypertension: Secondary | ICD-10-CM | POA: Diagnosis not present

## 2024-08-12 DIAGNOSIS — I5032 Chronic diastolic (congestive) heart failure: Secondary | ICD-10-CM | POA: Diagnosis not present

## 2024-08-12 LAB — HEMOGLOBIN A1C
Est. average glucose Bld gHb Est-mCnc: 171 mg/dL
Hgb A1c MFr Bld: 7.6 % — ABNORMAL HIGH (ref 4.8–5.6)

## 2024-08-12 LAB — CBC WITH DIFFERENTIAL/PLATELET
Basophils Absolute: 0 x10E3/uL (ref 0.0–0.2)
Basos: 1 %
EOS (ABSOLUTE): 0.2 x10E3/uL (ref 0.0–0.4)
Eos: 3 %
Hematocrit: 38.8 % (ref 34.0–46.6)
Hemoglobin: 12.9 g/dL (ref 11.1–15.9)
Immature Grans (Abs): 0 x10E3/uL (ref 0.0–0.1)
Immature Granulocytes: 0 %
Lymphocytes Absolute: 1.5 x10E3/uL (ref 0.7–3.1)
Lymphs: 28 %
MCH: 32.6 pg (ref 26.6–33.0)
MCHC: 33.2 g/dL (ref 31.5–35.7)
MCV: 98 fL — ABNORMAL HIGH (ref 79–97)
Monocytes Absolute: 0.3 x10E3/uL (ref 0.1–0.9)
Monocytes: 6 %
Neutrophils Absolute: 3.3 x10E3/uL (ref 1.4–7.0)
Neutrophils: 62 %
Platelets: 150 x10E3/uL (ref 150–450)
RBC: 3.96 x10E6/uL (ref 3.77–5.28)
RDW: 12.3 % (ref 11.7–15.4)
WBC: 5.3 x10E3/uL (ref 3.4–10.8)

## 2024-08-12 LAB — COMPREHENSIVE METABOLIC PANEL WITH GFR
ALT: 19 IU/L (ref 0–32)
AST: 26 IU/L (ref 0–40)
Albumin: 4.3 g/dL (ref 3.9–4.9)
Alkaline Phosphatase: 76 IU/L (ref 49–135)
BUN/Creatinine Ratio: 18 (ref 12–28)
BUN: 22 mg/dL (ref 8–27)
Bilirubin Total: 0.6 mg/dL (ref 0.0–1.2)
CO2: 23 mmol/L (ref 20–29)
Calcium: 9.8 mg/dL (ref 8.7–10.3)
Chloride: 104 mmol/L (ref 96–106)
Creatinine, Ser: 1.2 mg/dL — ABNORMAL HIGH (ref 0.57–1.00)
Globulin, Total: 2.4 g/dL (ref 1.5–4.5)
Glucose: 147 mg/dL — ABNORMAL HIGH (ref 70–99)
Potassium: 4.7 mmol/L (ref 3.5–5.2)
Sodium: 140 mmol/L (ref 134–144)
Total Protein: 6.7 g/dL (ref 6.0–8.5)
eGFR: 49 mL/min/1.73 — ABNORMAL LOW (ref >59)

## 2024-08-12 LAB — LIPID PANEL
Chol/HDL Ratio: 2.5 ratio (ref 0.0–4.4)
Cholesterol, Total: 143 mg/dL (ref 100–199)
HDL: 57 mg/dL (ref >39)
LDL Chol Calc (NIH): 61 mg/dL (ref 0–99)
Triglycerides: 148 mg/dL (ref 0–149)
VLDL Cholesterol Cal: 25 mg/dL (ref 5–40)

## 2024-08-12 LAB — TSH: TSH: 3.11 u[IU]/mL (ref 0.450–4.500)

## 2024-08-19 ENCOUNTER — Other Ambulatory Visit: Payer: Self-pay | Admitting: Nurse Practitioner

## 2024-08-20 NOTE — Telephone Encounter (Signed)
 Requested Prescriptions  Pending Prescriptions Disp Refills   meloxicam  (MOBIC ) 7.5 MG tablet [Pharmacy Med Name: Meloxicam  7.5 MG Oral Tablet] 90 tablet 1    Sig: Take 1 tablet by mouth once daily     Analgesics:  COX2 Inhibitors Failed - 08/20/2024  4:27 PM      Failed - Manual Review: Labs are only required if the patient has taken medication for more than 8 weeks.      Failed - Cr in normal range and within 360 days    Creatinine, Ser  Date Value Ref Range Status  08/11/2024 1.20 (H) 0.57 - 1.00 mg/dL Final         Passed - HGB in normal range and within 360 days    Hemoglobin  Date Value Ref Range Status  08/11/2024 12.9 11.1 - 15.9 g/dL Final         Passed - HCT in normal range and within 360 days    Hematocrit  Date Value Ref Range Status  08/11/2024 38.8 34.0 - 46.6 % Final         Passed - AST in normal range and within 360 days    AST  Date Value Ref Range Status  08/11/2024 26 0 - 40 IU/L Final   AST (SGOT) Piccolo, Waived  Date Value Ref Range Status  07/16/2017 28 11 - 38 U/L Final         Passed - ALT in normal range and within 360 days    ALT  Date Value Ref Range Status  08/11/2024 19 0 - 32 IU/L Final   ALT (SGPT) Piccolo, Waived  Date Value Ref Range Status  07/16/2017 33 10 - 47 U/L Final         Passed - eGFR is 30 or above and within 360 days    GFR calc Af Amer  Date Value Ref Range Status  09/05/2020 59 (L) >59 mL/min/1.73 Final    Comment:    **In accordance with recommendations from the NKF-ASN Task force,**   [facility] is in the process of updating its eGFR calculation to the   2021 CKD-EPI creatinine equation that estimates kidney function   without a race variable.    GFR, Estimated  Date Value Ref Range Status  08/18/2023 56 (L) >60 mL/min Final    Comment:    (NOTE) Calculated using the CKD-EPI Creatinine Equation (2021)    eGFR  Date Value Ref Range Status  08/11/2024 49 (L) >59 mL/min/1.73 Final         Passed -  Patient is not pregnant      Passed - Valid encounter within last 12 months    Recent Outpatient Visits           1 week ago Annual physical exam   [place] [facility] Melvin Pao, NP   6 months ago Stage 3a chronic kidney disease [facility])   [facility] Melvin Pao, NP   7 months ago Osteoarthritis of spine with radiculopathy, cervical region   [facility] [place], DO

## 2024-09-05 ENCOUNTER — Other Ambulatory Visit: Payer: Self-pay | Admitting: Nurse Practitioner

## 2024-09-07 DIAGNOSIS — H5213 Myopia, bilateral: Secondary | ICD-10-CM | POA: Diagnosis not present

## 2024-09-07 DIAGNOSIS — E119 Type 2 diabetes mellitus without complications: Secondary | ICD-10-CM | POA: Diagnosis not present

## 2024-09-07 LAB — OPHTHALMOLOGY REPORT-SCANNED

## 2024-09-07 NOTE — Telephone Encounter (Signed)
 Requested Prescriptions  Pending Prescriptions Disp Refills   empagliflozin  (JARDIANCE ) 10 MG TABS tablet [Pharmacy Med Name: Jardiance  10 MG Oral Tablet] 90 tablet 1    Sig: TAKE 1 TABLET BY MOUTH ONCE DAILY BEFORE BREAKFAST     Endocrinology:  Diabetes - SGLT2 Inhibitors Failed - 09/07/2024  1:13 PM      Failed - Cr in normal range and within 360 days    Creatinine, Ser  Date Value Ref Range Status  08/11/2024 1.20 (H) 0.57 - 1.00 mg/dL Final         Failed - eGFR in normal range and within 360 days    GFR calc Af Amer  Date Value Ref Range Status  09/05/2020 59 (L) >59 mL/min/1.73 Final    Comment:    **In accordance with recommendations from the NKF-ASN Task force,**   [facility] is in the process of updating its eGFR calculation to the   2021 CKD-EPI creatinine equation that estimates kidney function   without a race variable.    GFR, Estimated  Date Value Ref Range Status  08/18/2023 56 (L) >60 mL/min Final    Comment:    (NOTE) Calculated using the CKD-EPI Creatinine Equation (2021)    eGFR  Date Value Ref Range Status  08/11/2024 49 (L) >59 mL/min/1.73 Final         Passed - HBA1C is between 0 and 7.9 and within 180 days    Hemoglobin A1C  Date Value Ref Range Status  06/12/2016 7.8  Final   HB A1C (BAYER DCA - WAIVED)  Date Value Ref Range Status  01/22/2022 6.9 (H) 4.8 - 5.6 % Final    Comment:             Prediabetes: 5.7 - 6.4          Diabetes: >6.4          Glycemic control for adults with diabetes: <7.0    Hgb A1c MFr Bld  Date Value Ref Range Status  08/11/2024 7.6 (H) 4.8 - 5.6 % Final    Comment:             Prediabetes: 5.7 - 6.4          Diabetes: >6.4          Glycemic control for adults with diabetes: <7.0          Passed - Valid encounter within last 6 months    Recent Outpatient Visits           3 weeks ago Annual physical exam   [place] [facility] Melvin Pao, NP   7 months ago Stage 3a chronic kidney  disease [facility])   [place] [facility] Melvin Pao, NP   7 months ago Osteoarthritis of spine with radiculopathy, cervical region   [facility], [place] P, DO               Semaglutide , 2 MG/DOSE, (OZEMPIC , 2 MG/DOSE,) 8 MG/3ML SOPN [Pharmacy Med Name: Ozempic  (2 MG/DOSE) 8 MG/3ML Subcutaneous Solution Pen-injector] 6 mL 1    Sig: INJECT 2 MG  SUBCUTANEOUSLY ONCE A WEEK     Endocrinology:  Diabetes - GLP-1 Receptor Agonists - semaglutide  Failed - 09/07/2024  1:13 PM      Failed - HBA1C in normal range and within 180 days    Hemoglobin A1C  Date Value Ref Range Status  06/12/2016 7.8  Final   HB A1C (BAYER DCA - WAIVED)  Date Value Ref Range Status  01/22/2022 6.9 (H) 4.8 - 5.6 % Final    Comment:             Prediabetes: 5.7 - 6.4          Diabetes: >6.4          Glycemic control for adults with diabetes: <7.0    Hgb A1c MFr Bld  Date Value Ref Range Status  08/11/2024 7.6 (H) 4.8 - 5.6 % Final    Comment:             Prediabetes: 5.7 - 6.4          Diabetes: >6.4          Glycemic control for adults with diabetes: <7.0          Failed - Cr in normal range and within 360 days    Creatinine, Ser  Date Value Ref Range Status  08/11/2024 1.20 (H) 0.57 - 1.00 mg/dL Final         Passed - Valid encounter within last 6 months    Recent Outpatient Visits           3 weeks ago Annual physical exam   [place] [facility] Melvin Pao, NP   7 months ago Stage 3a chronic kidney disease [facility])   [place] [facility] Melvin Pao, NP   7 months ago Osteoarthritis of spine with radiculopathy, cervical region   [place] [facility], [place], DO

## 2024-09-30 NOTE — Progress Notes (Signed)
 Amanda [name]                                          MRN: [id number]   09/30/2024   The [facility] Quality Team Specialist reviewed this patient medical record for the purposes of chart review for care gap closure. The following were reviewed: chart review for care gap closure-breast cancer screening.    [facility] Quality Team

## 2024-09-30 NOTE — Progress Notes (Signed)
 Amanda [name]                                          MRN: [id number]   09/30/2024   The [facility] Quality Team Specialist reviewed this patient medical record for the purposes of chart review for care gap closure. The following were reviewed: abstraction for care gap closure-diabetic eye exam.    [facility] Quality Team

## 2024-10-26 ENCOUNTER — Telehealth: Payer: Self-pay | Admitting: Family

## 2024-10-26 NOTE — Telephone Encounter (Signed)
 Called to confirm/remind patient of their appointment at [facility] on 10/27/24.   Appointment:   [x] Confirmed  [] Left mess   [] No answer/No voice mail  [] VM Full/unable to leave message  [] Phone not in service  Patient reminded to bring all medications and/or complete list.  Confirmed patient has transportation. Gave directions, instructed to utilize valet parking.

## 2024-10-26 NOTE — Progress Notes (Unsigned)
 "  [facility] Note    PCP: Melvin Pao, NP  Cardiologist: Maryl Barre (last seen 10/25; returns 10/26)   Chief Complaint: HF visit   HPI:  Amanda [name] is a 69 y/o female with a history of DM, hyperlipidemia, HTN, anxiety, atrial fibrillation, GERD, previous tobacco use and chronic heart failure.   Echo 01/05/22: EF of 50-55% along with moderate LAE & moderate MR.   Has not been admitted or been in the ED in the last 6 months.   Seen in [facility] 12/24 where spironolactone  was increased to 25mg  daily.   Echo 06/18/24: EF 55-60%, normal RV  She presents today with a chief complaint of a HF visit. Currently denies any shortness of breath, fatigue, chest pain, palpitations, dizziness, edema or difficulty sleeping. Taking lasix / potassium PRN & hasn't taken this in quite awhile. 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    ROS: All systems negative except what is listed in HPI, PMH and Problem List   Past Medical History:  Diagnosis Date   Anxiety    Arthritis    Atrial fibrillation (HCC)    CHF (congestive heart failure) (HCC)    Cholecystitis    Cholecystitis    Diabetes mellitus without complication (HCC)    Dyspnea    GERD (gastroesophageal reflux disease)    Hyperlipidemia    Hypertension     Current Outpatient Medications  Medication Sig Dispense Refill   albuterol  (VENTOLIN  HFA) 108 (90 Base) MCG/ACT inhaler Inhale 2 puffs into the lungs every 6 (six) hours as needed for wheezing or shortness of breath. 8 g 0   baclofen  (LIORESAL ) 10 MG tablet Take 0.5-1 tablets (5-10 mg total) by mouth at bedtime as needed. 30 each 0   Calcium  Carb-Cholecalciferol (CALCIUM  600+D3) 600-200 MG-UNIT TABS Take 1 tablet by mouth in the morning.     carvedilol  (COREG ) 12.5 MG tablet TAKE 1 TABLET BY MOUTH TWICE DAILY WITH A MEAL 180 tablet 0   empagliflozin  (JARDIANCE ) 10 MG TABS tablet TAKE 1 TABLET BY MOUTH ONCE DAILY BEFORE BREAKFAST 90 tablet 1   ferrous  sulfate 325 (65 FE) MG tablet Take 1 tablet (325 mg total) by mouth daily. 30 tablet 3   meloxicam  (MOBIC ) 7.5 MG tablet Take 1 tablet by mouth once daily 90 tablet 1   Multiple Vitamin (MULTIVITAMIN) tablet Take 1 tablet by mouth daily.     omeprazole (PRILOSEC) 20 MG capsule Take 20 mg by mouth daily as needed.     rosuvastatin  (CRESTOR ) 10 MG tablet Take 1 tablet by mouth once daily 90 tablet 0   sacubitril -valsartan  (ENTRESTO ) 97-103 MG Take 1 tablet by mouth twice daily 180 tablet 1   Semaglutide , 2 MG/DOSE, (OZEMPIC , 2 MG/DOSE,) 8 MG/3ML SOPN INJECT 2 MG  SUBCUTANEOUSLY ONCE A WEEK 6 mL 1   spironolactone  (ALDACTONE ) 25 MG tablet Take 1 tablet (25 mg total) by mouth daily. 90 tablet 3   vitamin B-12 (CYANOCOBALAMIN ) 500 MCG tablet Take 500 mcg by mouth daily.     No current facility-administered medications for this visit.    Allergies  Allergen Reactions   Tramadol Other (See Comments)    woozy   Penicillins Other (See Comments)    Dizziness Tolerated augmentin but does not like taste      Social History   Socioeconomic History   Marital status: Single    Spouse name: Ubaldo   Number of children: Not on file   Years of education:  Not on file   Highest education level: Associate degree: occupational, technical, or vocational program  Occupational History   Not on file  Tobacco Use   Smoking status: Former    Current packs/day: 0.00    Types: Cigarettes    Quit date: 12/07/1975    Years since quitting: 48.9   Smokeless tobacco: Never  Vaping Use   Vaping status: Never Used  Substance and Sexual Activity   Alcohol use: No   Drug use: No   Sexual activity: Not on file  Other Topics Concern   Not on file  Social History Narrative   Not on file   Social Drivers of Health   Tobacco Use: Low Risk  (08/12/2024)   Received from [facility]   Patient History    Smoking Tobacco Use: Never    Smokeless Tobacco Use: Never    Passive Exposure: Not  on file  Recent Concern: Tobacco Use - Medium Risk (08/11/2024)   Patient History    Smoking Tobacco Use: Former    Smokeless Tobacco Use: Never    Passive Exposure: Not on Actuary Strain: Low Risk (05/30/2024)   Overall Financial Resource Strain (CARDIA)    Difficulty of Paying Living Expenses: Not hard at all  Food Insecurity: No Food Insecurity (05/30/2024)   Epic    Worried About Radiation Protection Practitioner of Food in the Last Year: Never true    Ran Out of Food in the Last Year: Never true  Transportation Needs: No Transportation Needs (05/30/2024)   Epic    Lack of Transportation (Medical): No    Lack of Transportation (Non-Medical): No  Physical Activity: Patient Declined (05/30/2024)   Exercise Vital Sign    Days of Exercise per Week: Patient declined    Minutes of Exercise per Session: Patient declined  Stress: Patient Declined (05/30/2024)   [employer] of Occupational Health - Occupational Stress Questionnaire    Feeling of Stress: Patient declined  Social Connections: Moderately Isolated (05/30/2024)   Social Connection and Isolation Panel    Frequency of Communication with Friends and Family: More than three times a week    Frequency of Social Gatherings with Friends and Family: Twice a week    Attends Religious Services: Patient declined    Database Administrator or Organizations: No    Attends Banker Meetings: Never    Marital Status: Living with partner  Intimate Partner Violence: Not At Risk (06/03/2024)   Epic    Fear of Current or Ex-Partner: No    Emotionally Abused: No    Physically Abused: No    Sexually Abused: No  Depression (PHQ2-9): Low Risk (08/11/2024)   Depression (PHQ2-9)    PHQ-2 Score: 1  Alcohol Screen: Low Risk (05/30/2024)   Alcohol Screen    Last Alcohol Screening Score (AUDIT): 0  Housing: Unknown (08/09/2024)   Received from [facility]   Epic    Unable to Pay for Housing in the Last Year: Not on file     Number of Times Moved in the Last Year: Not on file    At any time in the past 12 months, were you homeless or living in a shelter (including now)?: No  Utilities: Not At Risk (06/03/2024)   Epic    Threatened with loss of utilities: No  Health Literacy: Adequate Health Literacy (06/03/2024)   B1300 Health Literacy    Frequency of need for help with medical instructions: Never  Family History  Problem Relation Age of Onset   COPD Mother    Hypertension Mother    Diabetes Mother    Severe combined immunodeficiency Father    Diabetes Sister    Heart disease Sister    Heart disease Sister    Cancer Niece    Vitals:   10/27/24 0853  BP: 123/60  Pulse: 67  SpO2: 100%  Weight: 187 lb 12.8 oz (85.2 kg)   Wt Readings from Last 3 Encounters:  10/27/24 187 lb 12.8 oz (85.2 kg)  08/11/24 186 lb 9.6 oz (84.6 kg)  06/03/24 184 lb (83.5 kg)   Lab Results  Component Value Date   CREATININE 1.20 (H) 08/11/2024   CREATININE 1.25 (H) 04/15/2024   CREATININE 1.25 (H) 02/03/2024    PHYSICAL EXAM:  General: Well appearing.  Cor: No JVD. Regular rhythm, rate.  Lungs: clear Abdomen: soft, nontender, nondistended. Extremities: no edema Neuro:. Affect pleasant   ECG 08/12/24 @ [facility]: NSR   ASSESSMENT & PLAN:  1: NICM with preserved ejection fraction- - likely HTN/ AF - NYHA class I - euvolemic - weight up 3 pounds from last visit here 6 months ago - Echo 01/05/22: EF of 50-55% along with moderate LAE & moderate MR; plan to update this at her next visit - Echo 06/18/24: EF 55-60%, normal RV - adding very little salt to food - continue carvedilol  12.5mg  BID for rate control - continue jardiance  10mg  daily - continue entresto  97/103mg  BID - continue spironolactone  25mg  daily - continue furosemide  20mg  as needed/  potassium 20meq as needed if takes lasix . Hasn't taken this in quite awhile - BNP 01/04/22 was 537.6  2: HTN- - BP 123/60 - saw PCP [facility])  10/25 - BMP 08/11/24 reviewed: sodium 140, potassium 4.7, creatinine 1.2 and GFR 49  3: DM (managed by PCP)- - A1c 08/11/24 was 7.6% - continue ozempic  2mg  weekly  4: Atrial fibrillation- - saw cardiology (Custovic) 10/25 - continue carvedilol  12.5mg  BID  5: Hyperlipidemia- - continue rosuvastatin  10mg  daily - LDL 08/11/24 was 61. Microalbumin / creatinine ratio was >300   Return in 6 months, sooner if needed.   I spent 20 minutes reviewing records, interviewing/ examing patient and managing plan/ orders.   Ellouise DELENA Class, FNP 10/26/2024  "

## 2024-10-27 ENCOUNTER — Encounter: Payer: Self-pay | Admitting: Family

## 2024-10-27 ENCOUNTER — Ambulatory Visit: Admitting: Family

## 2024-10-27 VITALS — BP 123/60 | HR 67 | Wt 187.8 lb

## 2024-10-27 DIAGNOSIS — Z87891 Personal history of nicotine dependence: Secondary | ICD-10-CM | POA: Diagnosis not present

## 2024-10-27 DIAGNOSIS — E782 Mixed hyperlipidemia: Secondary | ICD-10-CM | POA: Diagnosis not present

## 2024-10-27 DIAGNOSIS — I482 Chronic atrial fibrillation, unspecified: Secondary | ICD-10-CM | POA: Diagnosis not present

## 2024-10-27 DIAGNOSIS — E785 Hyperlipidemia, unspecified: Secondary | ICD-10-CM | POA: Insufficient documentation

## 2024-10-27 DIAGNOSIS — E119 Type 2 diabetes mellitus without complications: Secondary | ICD-10-CM | POA: Diagnosis not present

## 2024-10-27 DIAGNOSIS — I5032 Chronic diastolic (congestive) heart failure: Secondary | ICD-10-CM | POA: Diagnosis present

## 2024-10-27 DIAGNOSIS — I110 Hypertensive heart disease with heart failure: Secondary | ICD-10-CM | POA: Diagnosis not present

## 2024-10-27 DIAGNOSIS — I428 Other cardiomyopathies: Secondary | ICD-10-CM | POA: Insufficient documentation

## 2024-10-27 DIAGNOSIS — Z79899 Other long term (current) drug therapy: Secondary | ICD-10-CM | POA: Diagnosis not present

## 2024-10-27 DIAGNOSIS — Z7984 Long term (current) use of oral hypoglycemic drugs: Secondary | ICD-10-CM | POA: Insufficient documentation

## 2024-10-27 DIAGNOSIS — Z7985 Long-term (current) use of injectable non-insulin antidiabetic drugs: Secondary | ICD-10-CM | POA: Insufficient documentation

## 2024-10-27 DIAGNOSIS — I480 Paroxysmal atrial fibrillation: Secondary | ICD-10-CM | POA: Diagnosis not present

## 2024-10-27 DIAGNOSIS — K219 Gastro-esophageal reflux disease without esophagitis: Secondary | ICD-10-CM | POA: Insufficient documentation

## 2024-10-27 DIAGNOSIS — I10 Essential (primary) hypertension: Secondary | ICD-10-CM | POA: Diagnosis not present

## 2024-10-27 NOTE — Patient Instructions (Signed)
It was good to see you today, keep up the good work!

## 2024-10-31 ENCOUNTER — Other Ambulatory Visit: Payer: Self-pay | Admitting: Nurse Practitioner

## 2024-11-01 NOTE — Telephone Encounter (Signed)
 Requested Prescriptions  Pending Prescriptions Disp Refills   rosuvastatin  (CRESTOR ) 10 MG tablet [Pharmacy Med Name: Rosuvastatin  Calcium  10 MG Oral Tablet] 90 tablet 2    Sig: Take 1 tablet by mouth once daily     Cardiovascular:  Antilipid - Statins 2 Failed - 11/01/2024  4:43 PM      Failed - Cr in normal range and within 360 days    Creatinine, Ser  Date Value Ref Range Status  08/11/2024 1.20 (H) 0.57 - 1.00 mg/dL Final         Failed - Lipid Panel in normal range within the last 12 months    Cholesterol, Total  Date Value Ref Range Status  08/11/2024 143 100 - 199 mg/dL Final   Cholesterol Piccolo, Waived  Date Value Ref Range Status  07/16/2017 196 <200 mg/dL Final    Comment:                            Desirable                <200                         Borderline High      200- 239                         High                     >239    LDL Chol Calc (NIH)  Date Value Ref Range Status  08/11/2024 61 0 - 99 mg/dL Final   HDL  Date Value Ref Range Status  08/11/2024 57 >39 mg/dL Final   Triglycerides  Date Value Ref Range Status  08/11/2024 148 0 - 149 mg/dL Final   Triglycerides Piccolo,Waived  Date Value Ref Range Status  07/16/2017 301 (H) <150 mg/dL Final    Comment:                            Normal                   <150                         Borderline High     150 - 199                         High                200 - 499                         Very High                >499          Passed - Patient is not pregnant      Passed - Valid encounter within last 12 months    Recent Outpatient Visits           2 months ago Annual physical exam   [facility] Melvin Pao, NP   9 months ago Stage 3a chronic kidney disease [facility])   [facility] Melvin Pao, NP   9 months ago Osteoarthritis of spine with  radiculopathy, cervical region   [facility],  [place], [place]

## 2025-02-09 ENCOUNTER — Ambulatory Visit: Admitting: Nurse Practitioner

## 2025-04-26 ENCOUNTER — Ambulatory Visit: Admitting: Family

## 2025-06-14 ENCOUNTER — Ambulatory Visit
